# Patient Record
Sex: Female | Born: 1935
Health system: Southern US, Community
[De-identification: ages and names within clinical notes are randomized; demographics above are authoritative.]

## PROBLEM LIST (undated history)

## (undated) DIAGNOSIS — N183 Chronic kidney disease, stage 3 unspecified: Secondary | ICD-10-CM

## (undated) DIAGNOSIS — E039 Hypothyroidism, unspecified: Secondary | ICD-10-CM

## (undated) DIAGNOSIS — I2699 Other pulmonary embolism without acute cor pulmonale: Secondary | ICD-10-CM

## (undated) DIAGNOSIS — H547 Unspecified visual loss: Secondary | ICD-10-CM

## (undated) DIAGNOSIS — K529 Noninfective gastroenteritis and colitis, unspecified: Secondary | ICD-10-CM

## (undated) DIAGNOSIS — M5416 Radiculopathy, lumbar region: Secondary | ICD-10-CM

## (undated) DIAGNOSIS — K56609 Unspecified intestinal obstruction, unspecified as to partial versus complete obstruction: Secondary | ICD-10-CM

## (undated) DIAGNOSIS — I480 Paroxysmal atrial fibrillation: Secondary | ICD-10-CM

## (undated) DIAGNOSIS — Z79899 Other long term (current) drug therapy: Secondary | ICD-10-CM

## (undated) DIAGNOSIS — E049 Nontoxic goiter, unspecified: Secondary | ICD-10-CM

## (undated) DIAGNOSIS — I809 Phlebitis and thrombophlebitis of unspecified site: Secondary | ICD-10-CM

## (undated) DIAGNOSIS — M48061 Spinal stenosis, lumbar region without neurogenic claudication: Secondary | ICD-10-CM

## (undated) DIAGNOSIS — M316 Other giant cell arteritis: Secondary | ICD-10-CM

## (undated) HISTORY — PX: PARTIAL HYSTERECTOMY: SHX80

## (undated) HISTORY — DX: Chronic kidney disease, stage 3 unspecified: N18.30

## (undated) HISTORY — PX: OTHER SURGICAL HISTORY: SHX169

## (undated) HISTORY — DX: Other long term (current) drug therapy: Z79.899

## (undated) HISTORY — PX: BREAST BIOPSY: SHX20

## (undated) HISTORY — DX: Chronic kidney disease, stage 3 (moderate): N18.3

## (undated) HISTORY — DX: Noninfective gastroenteritis and colitis, unspecified: K52.9

## (undated) HISTORY — DX: Hypothyroidism, unspecified: E03.9

---
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
## 1997-07-25 ENCOUNTER — Other Ambulatory Visit: Admission: RE | Admit: 1997-07-25 | Discharge: 1997-07-25 | Payer: Self-pay | Admitting: *Deleted

## 1997-07-25 ENCOUNTER — Ambulatory Visit (HOSPITAL_COMMUNITY): Admission: RE | Admit: 1997-07-25 | Discharge: 1997-07-25 | Payer: Self-pay | Admitting: *Deleted

## 1997-07-25 ENCOUNTER — Inpatient Hospital Stay (HOSPITAL_COMMUNITY): Admission: EM | Admit: 1997-07-25 | Discharge: 1997-08-03 | Payer: Self-pay | Admitting: *Deleted

## 1997-09-21 ENCOUNTER — Ambulatory Visit: Admission: RE | Admit: 1997-09-21 | Discharge: 1997-09-21 | Payer: Self-pay | Admitting: Gynecology

## 1997-09-27 ENCOUNTER — Inpatient Hospital Stay (HOSPITAL_COMMUNITY): Admission: RE | Admit: 1997-09-27 | Discharge: 1997-10-03 | Payer: Self-pay | Admitting: Vascular Surgery

## 1997-09-30 ENCOUNTER — Encounter: Payer: Self-pay | Admitting: Gynecology

## 1998-01-14 HISTORY — PX: BACK SURGERY: SHX140

## 1998-04-14 ENCOUNTER — Encounter: Payer: Self-pay | Admitting: Neurological Surgery

## 1998-04-15 HISTORY — PX: BACK SURGERY: SHX140

## 1998-04-18 ENCOUNTER — Inpatient Hospital Stay (HOSPITAL_COMMUNITY): Admission: RE | Admit: 1998-04-18 | Discharge: 1998-04-21 | Payer: Self-pay | Admitting: Neurological Surgery

## 1998-04-18 ENCOUNTER — Encounter: Payer: Self-pay | Admitting: Neurological Surgery

## 1998-04-20 ENCOUNTER — Encounter: Payer: Self-pay | Admitting: Neurological Surgery

## 1998-05-15 ENCOUNTER — Encounter: Payer: Self-pay | Admitting: Neurological Surgery

## 1998-05-15 ENCOUNTER — Inpatient Hospital Stay (HOSPITAL_COMMUNITY): Admission: RE | Admit: 1998-05-15 | Discharge: 1998-05-17 | Payer: Self-pay | Admitting: Neurological Surgery

## 1998-08-11 ENCOUNTER — Ambulatory Visit (HOSPITAL_COMMUNITY): Admission: RE | Admit: 1998-08-11 | Discharge: 1998-08-11 | Payer: Self-pay | Admitting: Neurological Surgery

## 1998-08-11 ENCOUNTER — Encounter: Payer: Self-pay | Admitting: Neurological Surgery

## 1999-01-18 ENCOUNTER — Encounter: Admission: RE | Admit: 1999-01-18 | Discharge: 1999-01-18 | Payer: Self-pay | Admitting: Gynecology

## 1999-01-18 ENCOUNTER — Encounter: Payer: Self-pay | Admitting: Gynecology

## 1999-01-24 ENCOUNTER — Other Ambulatory Visit: Admission: RE | Admit: 1999-01-24 | Discharge: 1999-01-24 | Payer: Self-pay | Admitting: Gynecology

## 1999-04-15 HISTORY — PX: TOTAL KNEE ARTHROPLASTY: SHX125

## 1999-05-02 ENCOUNTER — Encounter: Payer: Self-pay | Admitting: *Deleted

## 1999-05-02 ENCOUNTER — Ambulatory Visit: Admission: RE | Admit: 1999-05-02 | Discharge: 1999-05-02 | Payer: Self-pay | Admitting: *Deleted

## 1999-08-31 ENCOUNTER — Encounter: Payer: Self-pay | Admitting: Orthopedic Surgery

## 1999-09-04 ENCOUNTER — Inpatient Hospital Stay (HOSPITAL_COMMUNITY): Admission: RE | Admit: 1999-09-04 | Discharge: 1999-09-07 | Payer: Self-pay | Admitting: Orthopedic Surgery

## 1999-09-04 ENCOUNTER — Encounter: Payer: Self-pay | Admitting: Orthopedic Surgery

## 1999-09-07 ENCOUNTER — Inpatient Hospital Stay (HOSPITAL_COMMUNITY)
Admission: RE | Admit: 1999-09-07 | Discharge: 1999-09-12 | Payer: Self-pay | Admitting: Physical Medicine & Rehabilitation

## 2000-01-15 HISTORY — PX: CARPAL TUNNEL RELEASE: SHX101

## 2000-02-05 ENCOUNTER — Other Ambulatory Visit: Admission: RE | Admit: 2000-02-05 | Discharge: 2000-02-05 | Payer: Self-pay | Admitting: Gynecology

## 2000-02-05 ENCOUNTER — Encounter: Admission: RE | Admit: 2000-02-05 | Discharge: 2000-02-05 | Payer: Self-pay | Admitting: Gynecology

## 2000-02-05 ENCOUNTER — Encounter: Payer: Self-pay | Admitting: Gynecology

## 2000-02-15 HISTORY — PX: TOTAL KNEE ARTHROPLASTY: SHX125

## 2000-02-21 ENCOUNTER — Inpatient Hospital Stay (HOSPITAL_COMMUNITY): Admission: RE | Admit: 2000-02-21 | Discharge: 2000-02-26 | Payer: Self-pay | Admitting: Orthopedic Surgery

## 2000-09-25 ENCOUNTER — Encounter: Admission: RE | Admit: 2000-09-25 | Discharge: 2000-09-25 | Payer: Self-pay | Admitting: Otolaryngology

## 2000-09-25 ENCOUNTER — Encounter: Payer: Self-pay | Admitting: Otolaryngology

## 2000-10-13 ENCOUNTER — Encounter: Admission: RE | Admit: 2000-10-13 | Discharge: 2000-10-13 | Payer: Self-pay | Admitting: *Deleted

## 2000-10-13 ENCOUNTER — Encounter: Payer: Self-pay | Admitting: *Deleted

## 2001-02-09 ENCOUNTER — Encounter: Admission: RE | Admit: 2001-02-09 | Discharge: 2001-02-09 | Payer: Self-pay | Admitting: Gynecology

## 2001-02-09 ENCOUNTER — Encounter: Payer: Self-pay | Admitting: Gynecology

## 2001-02-20 ENCOUNTER — Other Ambulatory Visit: Admission: RE | Admit: 2001-02-20 | Discharge: 2001-02-20 | Payer: Self-pay | Admitting: Gynecology

## 2001-05-26 ENCOUNTER — Emergency Department (HOSPITAL_COMMUNITY): Admission: EM | Admit: 2001-05-26 | Discharge: 2001-05-26 | Payer: Self-pay

## 2001-05-29 ENCOUNTER — Inpatient Hospital Stay (HOSPITAL_COMMUNITY): Admission: EM | Admit: 2001-05-29 | Discharge: 2001-06-01 | Payer: Self-pay | Admitting: Internal Medicine

## 2001-05-29 ENCOUNTER — Encounter: Payer: Self-pay | Admitting: Internal Medicine

## 2001-06-22 ENCOUNTER — Ambulatory Visit (HOSPITAL_COMMUNITY): Admission: RE | Admit: 2001-06-22 | Discharge: 2001-06-22 | Payer: Self-pay | Admitting: Gastroenterology

## 2001-09-23 ENCOUNTER — Ambulatory Visit (HOSPITAL_COMMUNITY): Admission: RE | Admit: 2001-09-23 | Discharge: 2001-09-23 | Payer: Self-pay | Admitting: Family Medicine

## 2001-09-23 ENCOUNTER — Encounter: Payer: Self-pay | Admitting: Family Medicine

## 2002-03-17 ENCOUNTER — Encounter: Payer: Self-pay | Admitting: Gynecology

## 2002-03-17 ENCOUNTER — Encounter: Admission: RE | Admit: 2002-03-17 | Discharge: 2002-03-17 | Payer: Self-pay | Admitting: Gynecology

## 2002-07-27 ENCOUNTER — Ambulatory Visit: Admission: RE | Admit: 2002-07-27 | Discharge: 2002-07-27 | Payer: Self-pay | Admitting: Family Medicine

## 2003-03-24 ENCOUNTER — Encounter: Admission: RE | Admit: 2003-03-24 | Discharge: 2003-03-24 | Payer: Self-pay | Admitting: Gynecology

## 2003-03-29 ENCOUNTER — Other Ambulatory Visit: Admission: RE | Admit: 2003-03-29 | Discharge: 2003-03-29 | Payer: Self-pay | Admitting: Gynecology

## 2003-10-27 ENCOUNTER — Ambulatory Visit (HOSPITAL_COMMUNITY): Admission: RE | Admit: 2003-10-27 | Discharge: 2003-10-27 | Payer: Self-pay | Admitting: Anesthesiology

## 2004-04-02 ENCOUNTER — Encounter: Admission: RE | Admit: 2004-04-02 | Discharge: 2004-04-02 | Payer: Self-pay | Admitting: Gynecology

## 2004-10-19 ENCOUNTER — Ambulatory Visit (HOSPITAL_COMMUNITY): Admission: RE | Admit: 2004-10-19 | Discharge: 2004-10-19 | Payer: Self-pay | Admitting: Anesthesiology

## 2004-12-12 ENCOUNTER — Encounter: Admission: RE | Admit: 2004-12-12 | Discharge: 2004-12-12 | Payer: Self-pay | Admitting: Gastroenterology

## 2004-12-26 ENCOUNTER — Encounter: Admission: RE | Admit: 2004-12-26 | Discharge: 2004-12-26 | Payer: Self-pay | Admitting: Gastroenterology

## 2004-12-28 ENCOUNTER — Encounter: Admission: RE | Admit: 2004-12-28 | Discharge: 2004-12-28 | Payer: Self-pay | Admitting: Gastroenterology

## 2005-01-02 ENCOUNTER — Ambulatory Visit (HOSPITAL_COMMUNITY): Admission: RE | Admit: 2005-01-02 | Discharge: 2005-01-02 | Payer: Self-pay | Admitting: *Deleted

## 2005-02-06 ENCOUNTER — Ambulatory Visit: Payer: Self-pay | Admitting: Internal Medicine

## 2005-03-04 ENCOUNTER — Ambulatory Visit: Payer: Self-pay | Admitting: Internal Medicine

## 2005-04-03 ENCOUNTER — Encounter: Admission: RE | Admit: 2005-04-03 | Discharge: 2005-04-03 | Payer: Self-pay | Admitting: Gynecology

## 2005-04-10 ENCOUNTER — Other Ambulatory Visit: Admission: RE | Admit: 2005-04-10 | Discharge: 2005-04-10 | Payer: Self-pay | Admitting: Gynecology

## 2005-11-27 ENCOUNTER — Encounter: Admission: RE | Admit: 2005-11-27 | Discharge: 2005-12-24 | Payer: Self-pay | Admitting: Neurology

## 2005-12-25 ENCOUNTER — Encounter: Admission: RE | Admit: 2005-12-25 | Discharge: 2006-01-22 | Payer: Self-pay | Admitting: Neurology

## 2006-01-27 ENCOUNTER — Encounter: Admission: RE | Admit: 2006-01-27 | Discharge: 2006-02-24 | Payer: Self-pay | Admitting: Anesthesiology

## 2006-01-30 ENCOUNTER — Encounter: Admission: RE | Admit: 2006-01-30 | Discharge: 2006-01-30 | Payer: Self-pay | Admitting: Cardiothoracic Surgery

## 2006-04-16 ENCOUNTER — Encounter: Admission: RE | Admit: 2006-04-16 | Discharge: 2006-04-16 | Payer: Self-pay | Admitting: Gynecology

## 2006-06-26 ENCOUNTER — Ambulatory Visit (HOSPITAL_COMMUNITY): Admission: RE | Admit: 2006-06-26 | Discharge: 2006-06-26 | Payer: Self-pay | Admitting: Anesthesiology

## 2007-02-10 ENCOUNTER — Ambulatory Visit: Payer: Self-pay | Admitting: Internal Medicine

## 2007-02-19 ENCOUNTER — Ambulatory Visit: Payer: Self-pay | Admitting: Internal Medicine

## 2007-04-21 ENCOUNTER — Encounter: Admission: RE | Admit: 2007-04-21 | Discharge: 2007-04-21 | Payer: Self-pay | Admitting: Gynecology

## 2007-08-31 ENCOUNTER — Ambulatory Visit: Payer: Self-pay | Admitting: Internal Medicine

## 2008-03-10 ENCOUNTER — Ambulatory Visit: Payer: Self-pay | Admitting: Internal Medicine

## 2008-04-26 ENCOUNTER — Encounter: Admission: RE | Admit: 2008-04-26 | Discharge: 2008-04-26 | Payer: Self-pay | Admitting: Gynecology

## 2008-09-17 DIAGNOSIS — I4891 Unspecified atrial fibrillation: Secondary | ICD-10-CM | POA: Insufficient documentation

## 2008-09-17 DIAGNOSIS — R0989 Other specified symptoms and signs involving the circulatory and respiratory systems: Secondary | ICD-10-CM

## 2008-09-17 DIAGNOSIS — R0609 Other forms of dyspnea: Secondary | ICD-10-CM | POA: Insufficient documentation

## 2008-09-17 DIAGNOSIS — M129 Arthropathy, unspecified: Secondary | ICD-10-CM

## 2008-09-17 DIAGNOSIS — M316 Other giant cell arteritis: Secondary | ICD-10-CM | POA: Insufficient documentation

## 2008-09-17 DIAGNOSIS — E079 Disorder of thyroid, unspecified: Secondary | ICD-10-CM

## 2008-09-21 ENCOUNTER — Ambulatory Visit: Payer: Self-pay | Admitting: Internal Medicine

## 2008-09-21 DIAGNOSIS — R55 Syncope and collapse: Secondary | ICD-10-CM | POA: Insufficient documentation

## 2008-10-20 ENCOUNTER — Telehealth: Payer: Self-pay | Admitting: Internal Medicine

## 2008-11-21 ENCOUNTER — Telehealth: Payer: Self-pay | Admitting: Internal Medicine

## 2008-11-25 ENCOUNTER — Telehealth: Payer: Self-pay | Admitting: Internal Medicine

## 2009-03-13 ENCOUNTER — Encounter: Payer: Self-pay | Admitting: Internal Medicine

## 2009-03-30 ENCOUNTER — Ambulatory Visit: Payer: Self-pay | Admitting: Internal Medicine

## 2009-05-01 ENCOUNTER — Encounter: Admission: RE | Admit: 2009-05-01 | Discharge: 2009-05-01 | Payer: Self-pay | Admitting: Gynecology

## 2010-01-01 ENCOUNTER — Inpatient Hospital Stay (HOSPITAL_COMMUNITY)
Admission: EM | Admit: 2010-01-01 | Discharge: 2010-01-03 | Payer: Self-pay | Source: Home / Self Care | Attending: Internal Medicine | Admitting: Internal Medicine

## 2010-02-03 ENCOUNTER — Encounter: Payer: Self-pay | Admitting: Gastroenterology

## 2010-02-15 NOTE — Assessment & Plan Note (Signed)
Summary: 6 MONTH/DMP  Medications Added AMIODARONE HCL 200 MG TABS (AMIODARONE HCL) Take one tablet by mouth 3 days a week/ 1/2 tab 4days per week CALCIUM-VITAMIN D 600-200 MG-UNIT TABS (CALCIUM-VITAMIN D) three times a day      Allergies Added: ! PREDNISONE ! CORTISONE ! * OMNICEF ! SULFA  Visit Type:  Follow-up Referring Provider:  Daneen Schick, MD Primary Provider:  Antony Contras, MD   History of Present Illness: [name] returns today for follow up.  She is a pleasant elderly woman with a h/o PAF and sinus bradycardia who has had a h/o syncope in the past.  Since I last saw her 6 months ago, she has done well.  No c/p, sob, or syncope.  Current Medications (verified): 1)  Amiodarone Hcl 200 Mg Tabs (Amiodarone Hcl) .... Take One Tablet By Mouth 3 Days A Week/ 1/2 Tab 4days Per Week 2)  Synthroid 175 Mcg Tabs (Levothyroxine Sodium) .Marland Kitchen.. 1 By Mouth Once Daily 3)  Coumadin 2.5 Mg Tabs (Warfarin Sodium) .Marland Kitchen.. 1 By Mouth Once Daily 4)  Calcium-Vitamin D 600-200 Mg-Unit Tabs (Calcium-Vitamin D) .... Three Times A Day 5)  Folic Acid   Powd (Folic Acid) .Marland Kitchen.. 1 By Mouth Once Daily 6)  Occuvite .Marland Kitchen.. 1 By Mouth Once Daily 7)  Glucosamine-Chondroitin 500-400 Mg Caps (Glucosamine-Chondroitin) .Marland Kitchen.. 1 By Mouth Once Daily  Allergies (verified): 1)  ! Prednisone 2)  ! Cortisone 3)  ! * Omnicef 4)  ! Sulfa  Past History:  Past Medical History: Last updated: 09/17/2008 ATRIAL FIBRILLATION (ICD-427.31) DYSPNEA ON EXERTION (ICD-786.09) ARTHRITIS (ICD-716.90) UNSPECIFIED DISORDER OF THYROID (ICD-246.9) TEMPORAL ARTERITIS (ICD-446.5)  Review of Systems  The patient denies chest pain, syncope, dyspnea on exertion, and peripheral edema.    Vital Signs:  Patient profile:   77 year old female Height:      66 inches Weight:      135 pounds Pulse rate:   53 / minute BP sitting:   122 / 60  (left arm)  Vitals Entered By: Margaretmary Bayley CMA (March 30, 2009 10:28 AM)  Physical  Exam  General:  Elderly woman NAD. Head:  normocephalic and atraumatic Eyes:  PERRLA/EOM intact; conjunctiva and lids normal. Mouth:  Teeth, gums and palate normal. Oral mucosa normal. Neck:  Neck supple, no JVD. No masses, thyromegaly or abnormal cervical nodes. Lungs:  Clear bilaterally to auscultation with no wheezes, rales, or rhonchi. Heart:  RRR with normal S1 and S2.  PMI is not laterally displaced.  No murmur. Abdomen:  Bowel sounds positive; abdomen soft and non-tender without masses, organomegaly, or hernias noted. No hepatosplenomegaly. Msk:  Back normal, normal gait. Muscle strength and tone normal. Pulses:  pulses normal in all 4 extremities Extremities:  No clubbing or cyanosis. Neurologic:  Alert and oriented x 3.   EKG  Procedure date:  03/30/2009  Findings:      Sinus bradycardia with rate of:  56.  Impression & Recommendations:  Problem # 1:  ATRIAL FIBRILLATION (ICD-427.31) She appears to be maintaining NSR on low dose amiodarone.  Continue current meds. Her updated medication list for this problem includes:    Amiodarone Hcl 200 Mg Tabs (Amiodarone hcl) .Marland Kitchen... Take one tablet by mouth 3 days a week/ 1/2 tab 4days per week    Coumadin 2.5 Mg Tabs (Warfarin sodium) .Marland Kitchen... 1 by mouth once daily  Problem # 2:  SYNCOPE (ICD-780.2) No recurrent symptoms.  She will continue to try and not get up too quickly as we  think her last spell of syncope was likely orthostasis. Her updated medication list for this problem includes:    Amiodarone Hcl 200 Mg Tabs (Amiodarone hcl) .Marland Kitchen... Take one tablet by mouth 3 days a week/ 1/2 tab 4days per week    Coumadin 2.5 Mg Tabs (Warfarin sodium) .Marland Kitchen... 1 by mouth once daily  Patient Instructions: 1)  Your physician recommends that you schedule a follow-up appointment in: 12 months with Dr Lovena Le

## 2010-03-26 LAB — POCT CARDIAC MARKERS
CKMB, poc: 1.8 ng/mL (ref 1.0–8.0)
Myoglobin, poc: 105 ng/mL (ref 12–200)
Troponin i, poc: <0.05 ng/mL (ref 0.00–0.09)

## 2010-03-26 LAB — PROTIME-INR
INR: 2.06 — ABNORMAL HIGH (ref 0.00–1.49)
Prothrombin Time: 23.4 seconds — ABNORMAL HIGH (ref 11.6–15.2)

## 2010-03-26 LAB — BASIC METABOLIC PANEL
BUN: 16 mg/dL (ref 6–23)
CO2: 25 mEq/L (ref 19–32)
Calcium: 8.5 mg/dL (ref 8.4–10.5)
Chloride: 108 mEq/L (ref 96–112)
Creatinine, Ser: 1.19 mg/dL (ref 0.4–1.2)
GFR calc Af Amer: 52 mL/min — ABNORMAL LOW (ref >60)
GFR calc non Af Amer: 43 mL/min — ABNORMAL LOW (ref >60)
Glucose, Bld: 93 mg/dL (ref 70–99)
Potassium: 4 mEq/L (ref 3.5–5.1)
Sodium: 140 mEq/L (ref 135–145)

## 2010-03-26 LAB — DIFFERENTIAL
Basophils Absolute: 0 10*3/uL (ref 0.0–0.1)
Basophils Absolute: 0 10*3/uL (ref 0.0–0.1)
Basophils Relative: 0 % (ref 0–1)
Basophils Relative: 0 % (ref 0–1)
Eosinophils Absolute: 0 10*3/uL (ref 0.0–0.7)
Eosinophils Absolute: 0.1 10*3/uL (ref 0.0–0.7)
Eosinophils Relative: 0 % (ref 0–5)
Eosinophils Relative: 1 % (ref 0–5)
Lymphocytes Relative: 18 % (ref 12–46)
Lymphocytes Relative: 4 % — ABNORMAL LOW (ref 12–46)
Lymphs Abs: 0.5 10*3/uL — ABNORMAL LOW (ref 0.7–4.0)
Lymphs Abs: 1.5 10*3/uL (ref 0.7–4.0)
Monocytes Absolute: 0.5 10*3/uL (ref 0.1–1.0)
Monocytes Absolute: 0.7 10*3/uL (ref 0.1–1.0)
Monocytes Relative: 5 % (ref 3–12)
Monocytes Relative: 8 % (ref 3–12)
Neutro Abs: 10.1 10*3/uL — ABNORMAL HIGH (ref 1.7–7.7)
Neutro Abs: 6.2 10*3/uL (ref 1.7–7.7)
Neutrophils Relative %: 73 % (ref 43–77)
Neutrophils Relative %: 91 % — ABNORMAL HIGH (ref 43–77)

## 2010-03-26 LAB — HEMOCCULT GUIAC POC 1CARD (OFFICE): Fecal Occult Bld: NEGATIVE

## 2010-03-26 LAB — COMPREHENSIVE METABOLIC PANEL
ALT: 22 U/L (ref 0–35)
AST: 33 U/L (ref 0–37)
Albumin: 4.2 g/dL (ref 3.5–5.2)
Alkaline Phosphatase: 97 U/L (ref 39–117)
BUN: 28 mg/dL — ABNORMAL HIGH (ref 6–23)
CO2: 27 mEq/L (ref 19–32)
Calcium: 9.5 mg/dL (ref 8.4–10.5)
Chloride: 103 mEq/L (ref 96–112)
Creatinine, Ser: 1.27 mg/dL — ABNORMAL HIGH (ref 0.4–1.2)
GFR calc Af Amer: 48 mL/min — ABNORMAL LOW (ref >60)
GFR calc non Af Amer: 40 mL/min — ABNORMAL LOW (ref >60)
Glucose, Bld: 117 mg/dL — ABNORMAL HIGH (ref 70–99)
Potassium: 4.2 mEq/L (ref 3.5–5.1)
Sodium: 140 mEq/L (ref 135–145)
Total Bilirubin: 0.8 mg/dL (ref 0.3–1.2)
Total Protein: 7.2 g/dL (ref 6.0–8.3)

## 2010-03-26 LAB — CBC
HCT: 34.5 % — ABNORMAL LOW (ref 36.0–46.0)
HCT: 36.3 % (ref 36.0–46.0)
HCT: 43 % (ref 36.0–46.0)
Hemoglobin: 11 g/dL — ABNORMAL LOW (ref 12.0–15.0)
Hemoglobin: 11.7 g/dL — ABNORMAL LOW (ref 12.0–15.0)
Hemoglobin: 14.3 g/dL (ref 12.0–15.0)
MCH: 30.1 pg (ref 26.0–34.0)
MCH: 30.3 pg (ref 26.0–34.0)
MCH: 30.8 pg (ref 26.0–34.0)
MCH: 31.6 pg (ref 26.0–34.0)
MCHC: 31.9 g/dL (ref 30.0–36.0)
MCHC: 32 g/dL (ref 30.0–36.0)
MCHC: 32.2 g/dL (ref 30.0–36.0)
MCHC: 33.3 g/dL (ref 30.0–36.0)
MCV: 94.5 fL (ref 78.0–100.0)
MCV: 94.7 fL (ref 78.0–100.0)
MCV: 94.9 fL (ref 78.0–100.0)
MCV: 95.5 fL (ref 78.0–100.0)
Platelets: 204 10*3/uL (ref 150–400)
Platelets: 210 10*3/uL (ref 150–400)
Platelets: 217 10*3/uL (ref 150–400)
Platelets: 261 10*3/uL (ref 150–400)
RBC: 3.65 MIL/uL — ABNORMAL LOW (ref 3.87–5.11)
RBC: 3.79 MIL/uL — ABNORMAL LOW (ref 3.87–5.11)
RBC: 3.8 MIL/uL — ABNORMAL LOW (ref 3.87–5.11)
RBC: 4.53 MIL/uL (ref 3.87–5.11)
RDW: 14.1 % (ref 11.5–15.5)
RDW: 14.2 % (ref 11.5–15.5)
RDW: 14.2 % (ref 11.5–15.5)
WBC: 11.2 10*3/uL — ABNORMAL HIGH (ref 4.0–10.5)
WBC: 7.8 10*3/uL (ref 4.0–10.5)
WBC: 8.4 10*3/uL (ref 4.0–10.5)

## 2010-03-26 LAB — LIPASE, BLOOD: Lipase: 23 U/L (ref 11–59)

## 2010-03-26 LAB — URINALYSIS, ROUTINE W REFLEX MICROSCOPIC
Bilirubin Urine: NEGATIVE
Glucose, UA: NEGATIVE mg/dL
Hgb urine dipstick: NEGATIVE
Ketones, ur: NEGATIVE mg/dL
Nitrite: NEGATIVE
Protein, ur: NEGATIVE mg/dL
Specific Gravity, Urine: 1.022 (ref 1.005–1.030)
Urobilinogen, UA: 0.2 mg/dL (ref 0.0–1.0)
pH: 7 (ref 5.0–8.0)

## 2010-04-18 ENCOUNTER — Other Ambulatory Visit: Payer: Self-pay | Admitting: Neurology

## 2010-04-18 DIAGNOSIS — I639 Cerebral infarction, unspecified: Secondary | ICD-10-CM

## 2010-04-18 DIAGNOSIS — R1110 Vomiting, unspecified: Secondary | ICD-10-CM

## 2010-04-19 ENCOUNTER — Ambulatory Visit
Admission: RE | Admit: 2010-04-19 | Discharge: 2010-04-19 | Disposition: A | Discharge status: Home / Self Care | Payer: Medicare Other | Source: Ambulatory Visit | Attending: Neurology | Admitting: Neurology

## 2010-04-19 DIAGNOSIS — R1110 Vomiting, unspecified: Secondary | ICD-10-CM

## 2010-04-23 ENCOUNTER — Other Ambulatory Visit: Payer: Self-pay | Admitting: Gastroenterology

## 2010-04-23 DIAGNOSIS — R112 Nausea with vomiting, unspecified: Secondary | ICD-10-CM

## 2010-04-25 ENCOUNTER — Ambulatory Visit
Admission: RE | Admit: 2010-04-25 | Discharge: 2010-04-25 | Disposition: A | Discharge status: Home / Self Care | Payer: Medicare Other | Source: Ambulatory Visit | Attending: Gastroenterology | Admitting: Gastroenterology

## 2010-04-25 DIAGNOSIS — R112 Nausea with vomiting, unspecified: Secondary | ICD-10-CM

## 2010-05-08 ENCOUNTER — Other Ambulatory Visit: Payer: Self-pay | Admitting: Gynecology

## 2010-05-08 DIAGNOSIS — Z1231 Encounter for screening mammogram for malignant neoplasm of breast: Secondary | ICD-10-CM

## 2010-05-14 ENCOUNTER — Other Ambulatory Visit: Payer: Self-pay | Admitting: *Deleted

## 2010-05-14 MED ORDER — AMIODARONE HCL 200 MG PO TABS: ORAL_TABLET | ORAL | Status: DC

## 2010-05-17 ENCOUNTER — Ambulatory Visit
Admission: RE | Admit: 2010-05-17 | Discharge: 2010-05-17 | Disposition: A | Discharge status: Home / Self Care | Payer: Medicare Other | Source: Ambulatory Visit | Attending: Gynecology | Admitting: Gynecology

## 2010-05-17 DIAGNOSIS — Z1231 Encounter for screening mammogram for malignant neoplasm of breast: Secondary | ICD-10-CM

## 2010-05-29 NOTE — Assessment & Plan Note (Signed)
[place]                         ELECTROPHYSIOLOGY OFFICE NOTE   NAME:Hamilton Hamilton CLENDENEN                        MRN:          [id number]  DATE:02/10/2007                            DOB:          1920-09-18    Hamilton Hamilton returns today after an almost two-year absence from our EP  clinic.  She is a very pleasant elderly woman with a history of atrial  fibrillation status post initiation of amiodarone therapy.  She returns  today for follow-up.  She notes that she does have some dyspnea with  exertion but otherwise has been stable.  She denies palpitations or any  symptoms of her atrial fibrillation.  She has had no dizziness or  symptoms of bradycardia.   Her medications include:  1. Calcium.  2. Multiple vitamins.  3. Amiodarone 200 a day.  4. Coumadin as directed.   PHYSICAL EXAMINATION:  She is a pleasant elderly woman in no distress.  Blood pressure was 154/70, pulse 54 and regular, respirations were 18,  weight was 142 pounds.  NECK:  Revealed no jugular distention.  LUNGS:  Clear bilaterally to auscultation.  No wheezes, rales or rhonchi  were present.  CARDIOVASCULAR EXAM:  Revealed a regular rate and rhythm with normal S1-  S2.  There were no murmurs, rubs or gallops present.  EXTREMITIES:  Demonstrated no edema.   EKG demonstrates sinus bradycardia with right bundle branch block.   IMPRESSION:  1. Paroxysmal atrial fibrillation.  2. Chronic amiodarone therapy.  3. Chronic Coumadin therapy.  4. Asymptomatic bradycardia.   DISCUSSION:  Overall, Hamilton Hamilton is stable today.  I have asked that she  decrease her amiodarone dose from 200 daily to 200 Monday through Friday  and 100 mg on Saturdays and Sundays.  Because of her dyspnea, I have  asked she undergo pulmonary function testings with a DLCO so that we can  better understand that she is not having problems from her amiodarone  with regard to decreased diffusion or other lung problems  from  amiodarone.  I will see her back in the office in 6 months.    Champ Mungo. Lovena Le, MD  Electronically Signed   GWT/MedQ  DD: 02/10/2007  DT: 02/11/2007  Job #: [id number]   cc:   Belva Crome, M.D.

## 2010-05-29 NOTE — Assessment & Plan Note (Signed)
[place]                         ELECTROPHYSIOLOGY OFFICE NOTE   NAME:[name], [name]                        MRN:          [id number]  DATE:08/31/2007                            DOB:          August 29, 1920    [name] returns today for followup.  She is a very pleasant elderly  woman with a history of paroxysmal atrial fibrillation and thyroid  dysfunction and hypertension who returns today for followup.  The  patient has had decreasing doses of amiodarone.  Despite this, she has  had no recurrent symptomatic AFib.  She does have dyspnea with exertion  still, which has not worsened.  Her DLCO demonstrated mild decreased  diffusing capacity though not severe.  She returns today for followup.  She denies chest pain.  She has a very mild dyspnea with exertion.  She  notes that her blood pressure is up today, but states that at home it  has not been.   PHYSICAL EXAMINATION:  VITAL SIGNS:  Her blood pressure was 182/70, the  pulse 52 and regular, respirations were 18, and weight was 137 pounds.  NECK:  No jugular venous distention.  LUNGS:  Clear.  Bilateral auscultation.  No wheezes, rales or rhonchi  are present.  CARDIAC:  Regular rate and rhythm with normal S1 and S2.  There are no  murmurs, rubs or gallops.  ABDOMEN:  Soft, nontender.  There is no organomegaly.  Bowel sounds  present.  No rebound or guarding was noted.  EXTREMITIES:  No edema.   The EKG demonstrates a sinus bradycardia with right bundle-branch block  and what appears to be a prior septal MI.   IMPRESSION:  1. Paroxysmal atrial fibrillation.  2. Sinus bradycardia.  3. Amiodarone therapy.  4. Coumadin therapy.   DISCUSSION:  [name] is stable and she is maintaining sinus rhythm  very nicely.  She does have some sinus bradycardia.  She has had  reduction in DLCO in the past which is only mild, and I have recommend a  period of watchful waiting on this with plans to repeat her  pulmonary  function testing if her dyspnea symptoms worsen.  In the meantime, I  have asked her to decrease her amiodarone to 200 mg Monday through  Thursday, 100 mg Friday through Sunday.  I will see her back in several  months.     Champ Mungo. Lovena Le, MD  Electronically Signed    GWT/MedQ  DD: 08/31/2007  DT: 09/01/2007  Job #: [id number]

## 2010-05-29 NOTE — Assessment & Plan Note (Signed)
[place]                         ELECTROPHYSIOLOGY OFFICE NOTE   NAME:[name], [name]                        MRN:          [id number]  DATE:03/10/2008                            DOB:          28-Mar-1920    [name] returns today for followup.  She is very pleasant elderly  woman with a history of atrial fibrillation who has been nicely  controlled in sinus rhythm on low-dose amiodarone who returns today for  followup.  The patient notes that she does get fatigued and weak, but  has otherwise been very stable.  She gets dyspnea with exertion  particularly going up a grade.   Her medications include calcium and folate supplements, Ocuvite,  chondroitin, Coumadin 2.5 mg as directed, Synthroid 175 mcg daily,  Amiodarone 200 mg 4 days a week and 100 mg 3 days a week.  She is also  on amlodipine 5 a day.   On physical exam, she is a pleasant elderly woman in no distress.  Blood  pressure was 138/52, the pulse was 58 and regular, respirations were 18,  the weight was 138 pounds.  The neck revealed no jugular venous  distention.  Lungs were clear bilaterally to auscultation.  No wheezes,  rales, or rhonchi are present.  No increased work of breathing was  there.  Abdominal exam was soft, nontender.  Extremities demonstrated no  edema.   EKG demonstrates sinus bradycardia with incomplete right bundle-branch  block.   We walked the patient in the hall today and she managed to go up  approximately 100 yards around with no drop in her oxygen saturation  maintaining about 95% and a heart rate increased from the low 60s to the  low 80s.  The patient has not appeared be particularly winded from this.   IMPRESSION:  1. Dyspnea with exertion, likely multifactorial.  2. Atrial fibrillation, which is now well-controlled on amiodarone      therapy.  3. Dyspnea is multifactorial.   DISCUSSION:  We will plan on decreasing the patient's amiodarone to 100  mg 4  days a week and 200 mg 3 days a week with additional decrements in  the dosing as time goes on.  I will see her back in several months.     Champ Mungo. Lovena Le, MD  Electronically Signed    GWT/MedQ  DD: 03/10/2008  DT: 03/10/2008  Job #: [id number]   cc:   Dr. Pernell Dupre

## 2010-06-01 NOTE — Discharge Summary (Signed)
[facility]  Patient:    [name], [name] Visit Number: [id number] MRN: [id number]          Service Type: MED Location: [id number] Attending Physician:  Doyce Para Dictated by:   Doyce Para, M.D. Admit Date:  05/29/2001 Discharge Date: 06/01/2001   CC:         Belenda Cruise D. Henrene Pastor, M.D.  Jeryl Columbia, M.D.   Discharge Summary  DATE OF BIRTH:  12/04/1920  DISCHARGE DIAGNOSES:  1. Recurrent Clostridium difficile colitis.     a. Recurred after 10 days of Flagyl.     b. Planned vancomycin therapy (see discharge medications).  2. Electrolyte abnormalities (improved).     a. Hypokalemia.     b. Hypophosphatemia.  3. Slightly elevated eosinophil count, doubt significant.  4. Malnutrition secondary to #1.  5. Dehydration, improved.  6. Hypothyroidism, thyroid-stimulating hormone 6.57 (follow up as an     outpatient).  Radioactive iodine for goiter approximately 40 years ago.  7. Pulmonary embolus in June 1999.  8. Temporal arteritis in 1998.  9. Macular degeneration. 10. Degenerative arthritis. 11. Status post partial hysterectomy. 12. Osteoporosis/osteopenia.  DISCHARGE MEDICATIONS: 1. (New) Vancomycin 500 mg p.o. t.i.d. x5 days, then b.i.d. x5 days, then    restart Flagyl 500 mg b.i.d. until followup with Dr. Watt Climes. 2. (New) Potassium chloride 10 mEq q.d. x5 days. 3. (New) K-Phos neutral 250 t.i.d. x5 days. 4. Chondroitin glucosamine t.i.d. 5. Synthroid 0.15 mcg q.d. 6. Calcium 0000000 mg t.i.d. 7. Folic acid Q000111Q mcg q.d. 8. Ocuvite q.d.  CONDITION ON DISCHARGE:  Stable.  DISPOSITION:  To home.  ACTIVITY:  As tolerated.  DIET:  Low residue diet.  SPECIAL INSTRUCTIONS:  Contact Dr. Watt Climes or return if you have problems.  Do not take Vioxx or Fosamax while having stomach problems.  FOLLOWUP:  The patient is to contact Dr. Watt Climes to schedule followup in two weeks.  The patient is to see Dr. Henrene Pastor on Thursday, May 22, at 9:30.  He will check a  BMET, phosphorus, magnesium and CBC with differential.  CONSULTATION:  Dr. Watt Climes.  PROCEDURE:  Abdominal x-ray on May 29, 2001, with nonobstructive bowel gas pattern.  HOSPITAL COURSE:  #1 - RECURRENT CLOSTRIDIUM DIFFICILE COLITIS:  The patient is a 77 year old female who was diagnosed with C. difficile colitis and completed a 10-day course of Flagyl as an outpatient on May 8.  She had recurrent diarrhea and was tested again and had a repeat positive study and was restarted on Flagyl on May 15.  The plan was to treat with 14-21 days of therapy.  She, however, continued to have significant diarrhea and dehydration as well as electrolyte abnormalities and was admitted to the hospital for fluid and electrolyte support as well as closer monitoring.  Dr. Watt Climes saw the patient and recommended vancomycin therapy as described above.  Her symptoms improved and at the time of discharge, she was tolerating a diet.  She will return if she has problems.  #2 - HYPOTHYROIDISM:  TSH was slightly elevated.  Will have primary care follow this up as an outpatient.  It may be affected by her acute illness.  #3 - ELECTROLYTE ABNORMALITIES/DEHYDRATION:  Potassium level was 3.1 and magnesium 2.1.  Initial phosphorus level was low at 1.2.  She received replacement.  At the time of discharge, her phosphorus had come up to 2.2 and her potassium was 4.1.  She will go home on daily replacement for five days and follow  up with her regular physician.  During her hospital stay, she was Hemoccult negative and her C. difficile was negative, but again, she did have a positive one as an outpatient prior and she had been on therapy.  DISCHARGE LABORATORY DATA AND X-RAY FINDINGS:  Hemoglobin 14.0, WBC 10,400, platelet count 355, 12% eos, absolute count 1200.  Sodium 140, potassium 4.1, chloride 112, bicarb 24, glucose 91, BUN 4, creatinine 1.0, calcium 7.8, albumin 2.6.  SGOT 18, SGPT 13, Alk phos 48, total bilirubin  0.6, magnesium 2.0, phosphorus 2.2. Dictated by:   Doyce Para, M.D. Attending Physician:  Doyce Para DD:  06/15/01 TD:  06/16/01 Job: [id number]

## 2010-06-01 NOTE — Consult Note (Signed)
[facility]  Patient:    [name], RAMETTA Visit Number: [id number] MRN: [id number]          Service Type: MED Location: [phone] Attending Physician:  Doyce Para Dictated by:   Jeryl Columbia, M.D. Proc. Date: 05/30/01 Admit Date:  05/29/2001 Discharge Date: 06/01/2001   CC:         Doyce Para, M.D.  Linda Hedges, M.D.   Consultation Report  HISTORY OF PRESENT ILLNESS:  Ms. Browell is seen at the request of Dr. Doyce Para for recurrent C. diff diarrhea.  She initially got on an antibiotic for a sore throat, which improved it within one day, but then when she stopped the antibiotic she began having diarrhea.  She initially was empirically treated with ten days of Flagyl, then got better within two days.  No workup was done at that junction but after stopping the Flagyl, a few days later her diarrhea increased.  She has had increased abdominal pain and some nausea but no vomiting or bleeding.  She was dehydrated earlier in the week, came to the ER, and got some IV fluids.  A C. diff was done at that time which came back on Thursday positive, and she was restarted on her Flagyl on Friday but when she did not improve right away, came to the emergency room for admission.  She has had no previous bouts with this, may have had a parasite years ago treated by Dr. Lyla Son.  Supposedly she may have had a flex sig at that time.  She got well on antibiotics at that juncture but has had no other GI symptoms. She has not had any travel exposures, at risk exposures, or any other previous GI problem.  PAST MEDICAL HISTORY: 1. Bilateral tubes and ovaries removed; she still has her uterus. 2. Temporal arteritis complicated with clots and a filter placed. 3. Macular disk degeneration. 4. Thyroid problems, status post radiation. 5. Arthritis.  FAMILY HISTORY:  Negative for any obvious GI problems, colitis or colon cancer or colon polyps.  SOCIAL HISTORY:  She  does not drink or smoke.  CURRENT MEDICATIONS:  Flagyl, Synthroid, multiple vitamins, subcu. heparin, Vioxx, Fosamax, calcium, folic acid, glucosamine at home.  REVIEW OF SYSTEMS:  Pertinent for her still going to the bathroom every time she eats and still feeling fairly weak.  PHYSICAL EXAMINATION:  GENERAL:  The patient is in no acute distress lying comfortably in the bed.  VITAL SIGNS:  Vital signs stable, afebrile.  HEENT:  Sclerae nonicteric.  NECK:  Supple without obvious adenopathy.  LUNGS:  Clear.  HEART:  Regular rate and rhythm.  ABDOMEN:  Slightly sore, no guarding or rebound, good bowel sounds, soft.  EXTREMITIES:  No pedal edema.  LABORATORY DATA:  Normal BUN and creatinine today.  Her potassium is 3.1.  She was guaiac negative.  Albumin 2.9.  White count 11.8, hemoglobin 12.8, platelets 293.  ASSESSMENT:  Recurrent Clostridium difficile.  PLAN:  Will go ahead and change to Vancomycin.  I have discussed that with the family.  Based on financial reasons, probably would change back to Flagyl as an outpatient or only she would use for a short time and then change to Flagyl.  Would proceed with a flex sig if symptoms got any worse or possibly after two to three weeks of therapy.  Consider a colonoscopy at that junction, not only for screening but to rule out any recurrent disease to help decide how quickly we can  wean the medicine, but I do prefer when C. diff recurs with a slow weaning and tapering dose of either the Flagyl or Vancomycin.  I have discussed with Dr. Eliberto Ivory and will follow with you. Dictated by:   Jeryl Columbia, M.D. Attending Physician:  Doyce Para DD:  05/30/01 TD:  06/01/01 Job: [id number]

## 2010-08-08 ENCOUNTER — Telehealth: Payer: Self-pay | Admitting: Internal Medicine

## 2010-08-08 NOTE — Telephone Encounter (Signed)
Okay to decrease her Amiodarone Spoke with patricia

## 2010-08-08 NOTE — Telephone Encounter (Signed)
Per pt call, pt saw Dr. [name], pt's other cardiologist after pt was having difficulties with heart. Dr. [name] advised that pt cut down of the dosage of amiodarone. Pt is due a refill of amiodarone soon and wanted to know if Dr. Lovena Le would also advise/permit pt to decrease dosage of amiodarone. Please return call to pt daughter to advise.

## 2011-01-24 DIAGNOSIS — H401490 Capsular glaucoma with pseudoexfoliation of lens, unspecified eye, stage unspecified: Secondary | ICD-10-CM | POA: Diagnosis not present

## 2011-01-24 DIAGNOSIS — H353190 Nonexudative age-related macular degeneration, unspecified eye, stage unspecified: Secondary | ICD-10-CM | POA: Diagnosis not present

## 2011-01-28 DIAGNOSIS — K5289 Other specified noninfective gastroenteritis and colitis: Secondary | ICD-10-CM | POA: Diagnosis not present

## 2011-01-31 DIAGNOSIS — L253 Unspecified contact dermatitis due to other chemical products: Secondary | ICD-10-CM | POA: Diagnosis not present

## 2011-01-31 DIAGNOSIS — L719 Rosacea, unspecified: Secondary | ICD-10-CM | POA: Diagnosis not present

## 2011-02-05 DIAGNOSIS — I4891 Unspecified atrial fibrillation: Secondary | ICD-10-CM | POA: Diagnosis not present

## 2011-02-05 DIAGNOSIS — Z7901 Long term (current) use of anticoagulants: Secondary | ICD-10-CM | POA: Diagnosis not present

## 2011-03-12 DIAGNOSIS — Z7901 Long term (current) use of anticoagulants: Secondary | ICD-10-CM | POA: Diagnosis not present

## 2011-03-26 DIAGNOSIS — I10 Essential (primary) hypertension: Secondary | ICD-10-CM | POA: Diagnosis not present

## 2011-03-26 DIAGNOSIS — E039 Hypothyroidism, unspecified: Secondary | ICD-10-CM | POA: Diagnosis not present

## 2011-03-26 DIAGNOSIS — M79609 Pain in unspecified limb: Secondary | ICD-10-CM | POA: Diagnosis not present

## 2011-03-26 DIAGNOSIS — I4891 Unspecified atrial fibrillation: Secondary | ICD-10-CM | POA: Diagnosis not present

## 2011-03-26 DIAGNOSIS — K5289 Other specified noninfective gastroenteritis and colitis: Secondary | ICD-10-CM | POA: Diagnosis not present

## 2011-03-26 DIAGNOSIS — E782 Mixed hyperlipidemia: Secondary | ICD-10-CM | POA: Diagnosis not present

## 2011-04-04 DIAGNOSIS — I10 Essential (primary) hypertension: Secondary | ICD-10-CM | POA: Diagnosis not present

## 2011-04-04 DIAGNOSIS — Z7901 Long term (current) use of anticoagulants: Secondary | ICD-10-CM | POA: Diagnosis not present

## 2011-04-04 DIAGNOSIS — Z79899 Other long term (current) drug therapy: Secondary | ICD-10-CM | POA: Diagnosis not present

## 2011-04-04 DIAGNOSIS — I2510 Atherosclerotic heart disease of native coronary artery without angina pectoris: Secondary | ICD-10-CM | POA: Diagnosis not present

## 2011-04-17 DIAGNOSIS — Z7901 Long term (current) use of anticoagulants: Secondary | ICD-10-CM | POA: Diagnosis not present

## 2011-04-17 DIAGNOSIS — I4891 Unspecified atrial fibrillation: Secondary | ICD-10-CM | POA: Diagnosis not present

## 2011-04-25 DIAGNOSIS — M4310 Spondylolisthesis, site unspecified: Secondary | ICD-10-CM | POA: Diagnosis not present

## 2011-04-25 DIAGNOSIS — M48061 Spinal stenosis, lumbar region without neurogenic claudication: Secondary | ICD-10-CM | POA: Diagnosis not present

## 2011-04-25 DIAGNOSIS — M47817 Spondylosis without myelopathy or radiculopathy, lumbosacral region: Secondary | ICD-10-CM | POA: Diagnosis not present

## 2011-05-09 DIAGNOSIS — H903 Sensorineural hearing loss, bilateral: Secondary | ICD-10-CM | POA: Diagnosis not present

## 2011-05-21 DIAGNOSIS — I4891 Unspecified atrial fibrillation: Secondary | ICD-10-CM | POA: Diagnosis not present

## 2011-05-21 DIAGNOSIS — Z7901 Long term (current) use of anticoagulants: Secondary | ICD-10-CM | POA: Diagnosis not present

## 2011-06-21 DIAGNOSIS — Z7901 Long term (current) use of anticoagulants: Secondary | ICD-10-CM | POA: Diagnosis not present

## 2011-06-21 DIAGNOSIS — I4891 Unspecified atrial fibrillation: Secondary | ICD-10-CM | POA: Diagnosis not present

## 2011-06-25 DIAGNOSIS — R109 Unspecified abdominal pain: Secondary | ICD-10-CM | POA: Diagnosis not present

## 2011-07-03 DIAGNOSIS — M79609 Pain in unspecified limb: Secondary | ICD-10-CM | POA: Diagnosis not present

## 2011-07-03 DIAGNOSIS — B351 Tinea unguium: Secondary | ICD-10-CM | POA: Diagnosis not present

## 2011-07-04 DIAGNOSIS — M79609 Pain in unspecified limb: Secondary | ICD-10-CM | POA: Diagnosis not present

## 2011-07-04 DIAGNOSIS — M48061 Spinal stenosis, lumbar region without neurogenic claudication: Secondary | ICD-10-CM | POA: Diagnosis not present

## 2011-07-04 DIAGNOSIS — G609 Hereditary and idiopathic neuropathy, unspecified: Secondary | ICD-10-CM | POA: Diagnosis not present

## 2011-07-15 DIAGNOSIS — K5289 Other specified noninfective gastroenteritis and colitis: Secondary | ICD-10-CM | POA: Diagnosis not present

## 2011-07-15 DIAGNOSIS — R109 Unspecified abdominal pain: Secondary | ICD-10-CM | POA: Diagnosis not present

## 2011-08-06 DIAGNOSIS — H43819 Vitreous degeneration, unspecified eye: Secondary | ICD-10-CM | POA: Diagnosis not present

## 2011-08-06 DIAGNOSIS — H353290 Exudative age-related macular degeneration, unspecified eye, stage unspecified: Secondary | ICD-10-CM | POA: Diagnosis not present

## 2011-08-06 DIAGNOSIS — H353190 Nonexudative age-related macular degeneration, unspecified eye, stage unspecified: Secondary | ICD-10-CM | POA: Diagnosis not present

## 2011-08-06 DIAGNOSIS — H2510 Age-related nuclear cataract, unspecified eye: Secondary | ICD-10-CM | POA: Diagnosis not present

## 2011-08-12 DIAGNOSIS — I4891 Unspecified atrial fibrillation: Secondary | ICD-10-CM | POA: Diagnosis not present

## 2011-08-12 DIAGNOSIS — Z7901 Long term (current) use of anticoagulants: Secondary | ICD-10-CM | POA: Diagnosis not present

## 2011-09-17 DIAGNOSIS — Z7901 Long term (current) use of anticoagulants: Secondary | ICD-10-CM | POA: Diagnosis not present

## 2011-09-17 DIAGNOSIS — I4891 Unspecified atrial fibrillation: Secondary | ICD-10-CM | POA: Diagnosis not present

## 2011-09-26 DIAGNOSIS — E782 Mixed hyperlipidemia: Secondary | ICD-10-CM | POA: Diagnosis not present

## 2011-09-26 DIAGNOSIS — Z23 Encounter for immunization: Secondary | ICD-10-CM | POA: Diagnosis not present

## 2011-09-26 DIAGNOSIS — I4891 Unspecified atrial fibrillation: Secondary | ICD-10-CM | POA: Diagnosis not present

## 2011-09-26 DIAGNOSIS — M79609 Pain in unspecified limb: Secondary | ICD-10-CM | POA: Diagnosis not present

## 2011-09-26 DIAGNOSIS — E039 Hypothyroidism, unspecified: Secondary | ICD-10-CM | POA: Diagnosis not present

## 2011-09-26 DIAGNOSIS — I10 Essential (primary) hypertension: Secondary | ICD-10-CM | POA: Diagnosis not present

## 2011-09-26 DIAGNOSIS — K5289 Other specified noninfective gastroenteritis and colitis: Secondary | ICD-10-CM | POA: Diagnosis not present

## 2011-10-01 DIAGNOSIS — Z7901 Long term (current) use of anticoagulants: Secondary | ICD-10-CM | POA: Diagnosis not present

## 2011-10-01 DIAGNOSIS — E782 Mixed hyperlipidemia: Secondary | ICD-10-CM | POA: Diagnosis not present

## 2011-10-01 DIAGNOSIS — I4891 Unspecified atrial fibrillation: Secondary | ICD-10-CM | POA: Diagnosis not present

## 2011-10-01 DIAGNOSIS — Z79899 Other long term (current) drug therapy: Secondary | ICD-10-CM | POA: Diagnosis not present

## 2011-10-15 DIAGNOSIS — Z79899 Other long term (current) drug therapy: Secondary | ICD-10-CM | POA: Diagnosis not present

## 2011-10-22 DIAGNOSIS — B351 Tinea unguium: Secondary | ICD-10-CM | POA: Diagnosis not present

## 2011-10-22 DIAGNOSIS — M79609 Pain in unspecified limb: Secondary | ICD-10-CM | POA: Diagnosis not present

## 2011-10-29 DIAGNOSIS — Z7901 Long term (current) use of anticoagulants: Secondary | ICD-10-CM | POA: Diagnosis not present

## 2011-10-29 DIAGNOSIS — I4891 Unspecified atrial fibrillation: Secondary | ICD-10-CM | POA: Diagnosis not present

## 2011-12-10 DIAGNOSIS — Z7901 Long term (current) use of anticoagulants: Secondary | ICD-10-CM | POA: Diagnosis not present

## 2011-12-10 DIAGNOSIS — I4891 Unspecified atrial fibrillation: Secondary | ICD-10-CM | POA: Diagnosis not present

## 2012-01-23 DIAGNOSIS — I4891 Unspecified atrial fibrillation: Secondary | ICD-10-CM | POA: Diagnosis not present

## 2012-01-23 DIAGNOSIS — Z7901 Long term (current) use of anticoagulants: Secondary | ICD-10-CM | POA: Diagnosis not present

## 2012-02-04 DIAGNOSIS — R109 Unspecified abdominal pain: Secondary | ICD-10-CM | POA: Diagnosis not present

## 2012-02-04 DIAGNOSIS — R932 Abnormal findings on diagnostic imaging of liver and biliary tract: Secondary | ICD-10-CM | POA: Diagnosis not present

## 2012-02-04 DIAGNOSIS — K5289 Other specified noninfective gastroenteritis and colitis: Secondary | ICD-10-CM | POA: Diagnosis not present

## 2012-03-06 DIAGNOSIS — L84 Corns and callosities: Secondary | ICD-10-CM | POA: Diagnosis not present

## 2012-03-26 DIAGNOSIS — E782 Mixed hyperlipidemia: Secondary | ICD-10-CM | POA: Diagnosis not present

## 2012-03-31 DIAGNOSIS — I2510 Atherosclerotic heart disease of native coronary artery without angina pectoris: Secondary | ICD-10-CM | POA: Diagnosis not present

## 2012-04-13 DIAGNOSIS — E039 Hypothyroidism, unspecified: Secondary | ICD-10-CM | POA: Diagnosis not present

## 2012-04-13 DIAGNOSIS — M549 Dorsalgia, unspecified: Secondary | ICD-10-CM | POA: Diagnosis not present

## 2012-04-13 DIAGNOSIS — E782 Mixed hyperlipidemia: Secondary | ICD-10-CM | POA: Diagnosis not present

## 2012-04-13 DIAGNOSIS — I10 Essential (primary) hypertension: Secondary | ICD-10-CM | POA: Diagnosis not present

## 2012-04-13 DIAGNOSIS — H548 Legal blindness, as defined in USA: Secondary | ICD-10-CM | POA: Diagnosis not present

## 2012-04-13 DIAGNOSIS — I4891 Unspecified atrial fibrillation: Secondary | ICD-10-CM | POA: Diagnosis not present

## 2012-04-13 DIAGNOSIS — G609 Hereditary and idiopathic neuropathy, unspecified: Secondary | ICD-10-CM | POA: Diagnosis not present

## 2012-04-16 DIAGNOSIS — Z7901 Long term (current) use of anticoagulants: Secondary | ICD-10-CM | POA: Diagnosis not present

## 2012-04-16 DIAGNOSIS — I4891 Unspecified atrial fibrillation: Secondary | ICD-10-CM | POA: Diagnosis not present

## 2012-05-08 DIAGNOSIS — Q828 Other specified congenital malformations of skin: Secondary | ICD-10-CM | POA: Diagnosis not present

## 2012-05-08 DIAGNOSIS — B351 Tinea unguium: Secondary | ICD-10-CM | POA: Diagnosis not present

## 2012-05-08 DIAGNOSIS — M79609 Pain in unspecified limb: Secondary | ICD-10-CM | POA: Diagnosis not present

## 2012-05-28 DIAGNOSIS — I4891 Unspecified atrial fibrillation: Secondary | ICD-10-CM | POA: Diagnosis not present

## 2012-05-28 DIAGNOSIS — Z7901 Long term (current) use of anticoagulants: Secondary | ICD-10-CM | POA: Diagnosis not present

## 2012-06-30 DIAGNOSIS — I4891 Unspecified atrial fibrillation: Secondary | ICD-10-CM | POA: Diagnosis not present

## 2012-06-30 DIAGNOSIS — Z7901 Long term (current) use of anticoagulants: Secondary | ICD-10-CM | POA: Diagnosis not present

## 2012-07-08 DIAGNOSIS — B351 Tinea unguium: Secondary | ICD-10-CM | POA: Diagnosis not present

## 2012-07-08 DIAGNOSIS — M79609 Pain in unspecified limb: Secondary | ICD-10-CM | POA: Diagnosis not present

## 2012-07-23 DIAGNOSIS — I4891 Unspecified atrial fibrillation: Secondary | ICD-10-CM | POA: Diagnosis not present

## 2012-07-23 DIAGNOSIS — Z7901 Long term (current) use of anticoagulants: Secondary | ICD-10-CM | POA: Diagnosis not present

## 2012-07-28 ENCOUNTER — Other Ambulatory Visit: Payer: Self-pay | Admitting: *Deleted

## 2012-08-04 DIAGNOSIS — H353190 Nonexudative age-related macular degeneration, unspecified eye, stage unspecified: Secondary | ICD-10-CM | POA: Diagnosis not present

## 2012-08-04 DIAGNOSIS — H353290 Exudative age-related macular degeneration, unspecified eye, stage unspecified: Secondary | ICD-10-CM | POA: Diagnosis not present

## 2012-08-06 DIAGNOSIS — I4891 Unspecified atrial fibrillation: Secondary | ICD-10-CM | POA: Diagnosis not present

## 2012-08-06 DIAGNOSIS — K5289 Other specified noninfective gastroenteritis and colitis: Secondary | ICD-10-CM | POA: Diagnosis not present

## 2012-08-06 DIAGNOSIS — R109 Unspecified abdominal pain: Secondary | ICD-10-CM | POA: Diagnosis not present

## 2012-08-06 DIAGNOSIS — Z7901 Long term (current) use of anticoagulants: Secondary | ICD-10-CM | POA: Diagnosis not present

## 2012-09-03 DIAGNOSIS — I4891 Unspecified atrial fibrillation: Secondary | ICD-10-CM | POA: Diagnosis not present

## 2012-09-03 DIAGNOSIS — Z7901 Long term (current) use of anticoagulants: Secondary | ICD-10-CM | POA: Diagnosis not present

## 2012-09-08 ENCOUNTER — Ambulatory Visit (INDEPENDENT_AMBULATORY_CARE_PROVIDER_SITE_OTHER): Payer: Medicare Other | Admitting: Neurology

## 2012-09-08 ENCOUNTER — Encounter: Payer: Self-pay | Admitting: Neurology

## 2012-09-08 VITALS — BP 149/75 | HR 62 | Ht 65.0 in | Wt 137.0 lb

## 2012-09-08 DIAGNOSIS — R2689 Other abnormalities of gait and mobility: Secondary | ICD-10-CM

## 2012-09-08 DIAGNOSIS — R269 Unspecified abnormalities of gait and mobility: Secondary | ICD-10-CM

## 2012-09-08 DIAGNOSIS — H9190 Unspecified hearing loss, unspecified ear: Secondary | ICD-10-CM

## 2012-09-08 DIAGNOSIS — G250 Essential tremor: Secondary | ICD-10-CM

## 2012-09-08 DIAGNOSIS — M545 Low back pain: Secondary | ICD-10-CM | POA: Diagnosis not present

## 2012-09-08 DIAGNOSIS — G609 Hereditary and idiopathic neuropathy, unspecified: Secondary | ICD-10-CM

## 2012-09-08 DIAGNOSIS — H9193 Unspecified hearing loss, bilateral: Secondary | ICD-10-CM

## 2012-09-08 DIAGNOSIS — H543 Unqualified visual loss, both eyes: Secondary | ICD-10-CM

## 2012-09-08 NOTE — Progress Notes (Signed)
Subjective:    Patient ID: [name] is a 79 y.o. female.  HPI  Interim history:   [name] is a very pleasant 79 year old right-handed woman who presents for followup consultation of her gait disorder, secondary to peripheral neuropathy, degenerative spine disease, blindness, hearing loss, vertigo, and essential tremor. She is accompanied by her daughter, Barbaraann Share, today. She presents for the first time after Dr. Tressia Danas retirement and was last seen by Dr. Jeneen Rinks love on 03/12/2012, at which time Dr. Erling Cruz felt that she was stable and that she was doing well living by herself. Her current medications include Tylenol, Coumadin, calcium and vitamin D, Ocuvite, glucosamine-chondroitin, amiodarone, Synthroid, Mesalamine. She has an underlying complex medical history including atrial fibrillation, thyroid disease, giant cell arteritis, DVT with PE, hysterectomy, status post [place] filter placement, spinal stenosis with surgery in 2000 twice, right knee replacement surgery in 2001, left knee replacement surgery in 2002, essential tremor, peripheral neuropathy, macular degeneration, hearing loss.  I reviewed Dr. Tressia Danas prior notes and the patient's records and below is a summary of that review:  79 year old right-handed woman with a multifactorial gait disorder. She lives alone and her daughter helps to take her places as she no longer drives. She sees Dr. Hardin Negus for pain management. She has had bladder incontinence. She has a long-standing history of peripheral neuropathy. She has a history of carpal tunnel syndrome with surgery to the right wrist. She uses a walker inside and outside of the house. She has not fallen recently. She has a 2 storey home. 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 2 daughters are local and her other daughter lives 20 minutes away. She has a life alert button.   Her Past Medical History Is Significant For: Past Medical History  Diagnosis Date   . Hypothyroidism   . Colitis     Her Past Surgical History Is Significant For: Past Surgical History  Procedure Laterality Date  . Back surgery  04/1998  . Back surgery  2000    Ruptured Disc  . Total knee arthroplasty Left 04/1999  . Total ankle replacement Right 02/2000  . Carpal tunnel release  2002    Her Family History Is Significant For: No family history on file.  Her Social History Is Significant For: History   Social History  . Marital Status: Widowed    Spouse Name: N/A    Number of Children: 4  . Years of Education: College   Occupational History  .     Social History Main Topics  . Smoking status: Never Smoker   . Smokeless tobacco: Never Used  . Alcohol Use: None  . Drug Use: None  . Sexual Activity: None   Other Topics Concern  . None   Social History Narrative   Patient lives at home alone.   Caffeine Use: rarely    Her Allergies Are:  Allergies  Allergen Reactions  . Cefdinir   . Cortisone   . Prednisone   . Sulfonamide Derivatives   :   Her Current Medications Are:  Outpatient Encounter Prescriptions as of 09/08/2012  Medication Sig Dispense Refill  . amiodarone (PACERONE) 200 MG tablet Take 100 mg by mouth daily. 1/2 tab qd      . levothyroxine (SYNTHROID, LEVOTHROID) 125 MCG tablet Take 125 mcg by mouth daily before breakfast.      . mesalamine (LIALDA) 1.2 G EC tablet Take 1,200 mg by mouth daily.      Marland Kitchen  warfarin (COUMADIN) 2.5 MG tablet Take 2.5 mg by mouth daily.      . [DISCONTINUED] amiodarone (PACERONE) 200 MG tablet Take 1 tab 3 days a week and 1/2 tab 4 days a week  30 tablet  11   No facility-administered encounter medications on file as of 09/08/2012.    Review of Systems  HENT: Positive for hearing loss.   Eyes: Positive for visual disturbance (loss vision).  Respiratory: Positive for shortness of breath.   Gastrointestinal:       Incontinence  Musculoskeletal: Positive for myalgias and arthralgias.       Cramps   Neurological: Positive for tremors and numbness.  Hematological: Bruises/bleeds easily.    Objective:  Neurologic Exam  Physical Exam Physical Examination:   Filed Vitals:   09/08/12 1117  BP: 149/75  Pulse: 62    General Examination: The patient is a very pleasant 79 y.o. female in no acute distress. She appears well-developed and well-nourished and well groomed.   HEENT: Normocephalic, atraumatic, pupils are equal, round and reactive to light and accommodation. Extraocular tracking is good without limitation to gaze excursion or nystagmus noted. Normal smooth pursuit is noted. Hearing is grossly intact. Tympanic membranes are clear bilaterally. Face is symmetric with normal facial animation and normal facial sensation. Speech is clear with no dysarthria noted. There is no hypophonia. There is a lip, no-no type neck/head, and voice tremor. Neck is supple with full range of passive and active motion. There are no carotid bruits on auscultation. Oropharynx exam reveals: mild mouth dryness, adequate dental hygiene and no significant airway crowding.  Chest: Clear to auscultation without wheezing, rhonchi or crackles noted.  Heart: S1+S2+0, regular and normal without murmurs, rubs or gallops noted.   Abdomen: Soft, non-tender and non-distended with normal bowel sounds appreciated on auscultation.  Extremities: There is no pitting edema in the distal lower extremities bilaterally. Pedal pulses are intact.  Skin: Warm and dry without trophic changes noted. There are no varicose veins.  Musculoskeletal: exam reveals no obvious joint deformities, tenderness or joint swelling or erythema.   Neurologically:  Mental status: The patient is awake, alert and oriented in all 4 spheres. Her memory, attention, language and knowledge are appropriate. There is no aphasia, agnosia, apraxia or anomia. Speech is clear with normal prosody and enunciation. Thought process is linear. Mood is congruent and  affect is normal.  Cranial nerves are as described above under HEENT exam. In addition, shoulder shrug is normal with equal shoulder height noted. Motor exam: Normal bulk, strength and tone is noted. There is no drift, tremor or rebound. Romberg is negative. Reflexes are 1+ throughout, absent in the knees and ankles. Toes are downgoing bilaterally. Fine motor skills are fairly well preserved.  Cerebellar testing shows no dysmetria or intention tremor on finger to nose testing. There is no truncal or gait ataxia.  Sensory exam is intact to light touch, pinprick, vibration, temperature sense in the upper extremities and decrease in vibration in the LEs.  Gait, station and balance:  are unremarkable. No veering to one side is noted. No leaning to one side is noted. Posture is mildly stooped. Stance is slightly wide-based. She walks independently for a few steps, she is slightly unsteady. Cannot do tandem walk. Wears a soft back brace.   Assessment and Plan:   In summary, IRHA ARNET is a very pleasant 79 y.o.-year old female with a history of mild peripheral neuropathy, chronic degenerative spine disease, blindness, hearing loss, vertigo, and  essential tremor and a gait disorder. She remains stable in her exam and I reassured the patient and her daughter in that regard. I had a long chat with the patient and her daughter about my findings and her neurological diagnoses. I would not recommend additional medication for neuropathy or tremor at this time. Truncal or midline tremor can be difficult to treat and she may be at risk of developing side effects. She and her daughter were in agreement. My main concern today is the fact that she continues to live alone. I'm particularly concerned about her cooking at the stove. She is hearing in visually impaired and has balance problems, gait dysfunction, and abnormal posture and is elderly. I've asked him to start a discussion with regards to what help she may get at  home on a day-to-day basis and whether there is an alternative living situation that she could embark on. I'm not making any medication changes today and have advised her to use her walker at all times. She did not bring her walker today stating that she was holding onto her daughter. I will see her back routinely in 6 months from now, sooner if the need arises. I have encouraged him to call with any interim questions, concerns, problems or updates.

## 2012-09-08 NOTE — Patient Instructions (Signed)
Please do not cook with large quantities of boiling water or oil safety. Please discuss with your family additional support and help at home or alternative living arrangements going into the future.

## 2012-09-09 DIAGNOSIS — B351 Tinea unguium: Secondary | ICD-10-CM | POA: Diagnosis not present

## 2012-09-09 DIAGNOSIS — M79609 Pain in unspecified limb: Secondary | ICD-10-CM | POA: Diagnosis not present

## 2012-09-09 DIAGNOSIS — I872 Venous insufficiency (chronic) (peripheral): Secondary | ICD-10-CM | POA: Diagnosis not present

## 2012-09-11 ENCOUNTER — Encounter (INDEPENDENT_AMBULATORY_CARE_PROVIDER_SITE_OTHER): Payer: Medicare Other | Admitting: *Deleted

## 2012-09-11 DIAGNOSIS — M79609 Pain in unspecified limb: Secondary | ICD-10-CM | POA: Diagnosis not present

## 2012-09-29 DIAGNOSIS — Z23 Encounter for immunization: Secondary | ICD-10-CM | POA: Diagnosis not present

## 2012-09-29 DIAGNOSIS — G5600 Carpal tunnel syndrome, unspecified upper limb: Secondary | ICD-10-CM | POA: Diagnosis not present

## 2012-09-29 DIAGNOSIS — I10 Essential (primary) hypertension: Secondary | ICD-10-CM | POA: Diagnosis not present

## 2012-09-29 DIAGNOSIS — K5289 Other specified noninfective gastroenteritis and colitis: Secondary | ICD-10-CM | POA: Diagnosis not present

## 2012-09-29 DIAGNOSIS — I4891 Unspecified atrial fibrillation: Secondary | ICD-10-CM | POA: Diagnosis not present

## 2012-09-29 DIAGNOSIS — E782 Mixed hyperlipidemia: Secondary | ICD-10-CM | POA: Diagnosis not present

## 2012-09-29 DIAGNOSIS — Z1331 Encounter for screening for depression: Secondary | ICD-10-CM | POA: Diagnosis not present

## 2012-09-29 DIAGNOSIS — E039 Hypothyroidism, unspecified: Secondary | ICD-10-CM | POA: Diagnosis not present

## 2012-10-13 DIAGNOSIS — I4891 Unspecified atrial fibrillation: Secondary | ICD-10-CM | POA: Diagnosis not present

## 2012-10-13 DIAGNOSIS — Z7901 Long term (current) use of anticoagulants: Secondary | ICD-10-CM | POA: Diagnosis not present

## 2012-10-13 DIAGNOSIS — I10 Essential (primary) hypertension: Secondary | ICD-10-CM | POA: Diagnosis not present

## 2012-10-13 DIAGNOSIS — I2510 Atherosclerotic heart disease of native coronary artery without angina pectoris: Secondary | ICD-10-CM | POA: Diagnosis not present

## 2012-10-13 DIAGNOSIS — Z79899 Other long term (current) drug therapy: Secondary | ICD-10-CM | POA: Diagnosis not present

## 2012-10-16 ENCOUNTER — Other Ambulatory Visit: Payer: Self-pay | Admitting: Interventional Cardiology

## 2012-10-16 DIAGNOSIS — Z79899 Other long term (current) drug therapy: Secondary | ICD-10-CM

## 2012-10-19 ENCOUNTER — Other Ambulatory Visit: Payer: Self-pay | Admitting: Interventional Cardiology

## 2012-10-19 ENCOUNTER — Other Ambulatory Visit: Payer: Self-pay

## 2012-10-19 DIAGNOSIS — Z79899 Other long term (current) drug therapy: Secondary | ICD-10-CM

## 2012-11-12 DIAGNOSIS — E039 Hypothyroidism, unspecified: Secondary | ICD-10-CM | POA: Diagnosis not present

## 2012-11-16 ENCOUNTER — Other Ambulatory Visit: Payer: Self-pay | Admitting: Interventional Cardiology

## 2012-11-26 ENCOUNTER — Ambulatory Visit (INDEPENDENT_AMBULATORY_CARE_PROVIDER_SITE_OTHER): Payer: Medicare Other | Admitting: Pharmacist

## 2012-11-26 DIAGNOSIS — I4891 Unspecified atrial fibrillation: Secondary | ICD-10-CM | POA: Diagnosis not present

## 2012-12-02 ENCOUNTER — Encounter: Payer: Self-pay | Admitting: Podiatrist

## 2012-12-02 ENCOUNTER — Ambulatory Visit (INDEPENDENT_AMBULATORY_CARE_PROVIDER_SITE_OTHER): Payer: Medicare Other | Admitting: Podiatrist

## 2012-12-02 VITALS — BP 135/66 | HR 67 | Resp 12 | Ht 65.0 in | Wt 130.0 lb

## 2012-12-02 DIAGNOSIS — M79609 Pain in unspecified limb: Secondary | ICD-10-CM | POA: Diagnosis not present

## 2012-12-02 DIAGNOSIS — B351 Tinea unguium: Secondary | ICD-10-CM

## 2012-12-02 NOTE — Progress Notes (Signed)
HPI:  Patient presents today for follow up of foot and nail care. Denies any new complaints today.  Objective:  Patients chart is reviewed.  Neurovascular status unchanged.  Patients nails are thickened, discolored, distrophic, friable and brittle with yellow-brown discoloration. Patient subjectively relates they are painful with shoes and with ambulation of bilateral feet.  Assessment:  Symptomatic onychomycosis  Plan:  Discussed treatment options and alternatives.  The symptomatic toenails were debrided through manual an mechanical means without complication.  Return appointment recommended at routine intervals of 3 months  also note, she had a small corn on her 5th toe of the right foot that has resolved with use of lambswool.    Trudie Buckler, DPM

## 2012-12-02 NOTE — Patient Instructions (Signed)

## 2012-12-03 DIAGNOSIS — H903 Sensorineural hearing loss, bilateral: Secondary | ICD-10-CM | POA: Diagnosis not present

## 2012-12-17 DIAGNOSIS — R109 Unspecified abdominal pain: Secondary | ICD-10-CM | POA: Diagnosis not present

## 2012-12-17 DIAGNOSIS — K5289 Other specified noninfective gastroenteritis and colitis: Secondary | ICD-10-CM | POA: Diagnosis not present

## 2012-12-24 ENCOUNTER — Ambulatory Visit (INDEPENDENT_AMBULATORY_CARE_PROVIDER_SITE_OTHER): Payer: Medicare Other | Admitting: Pharmacist

## 2012-12-24 DIAGNOSIS — I4891 Unspecified atrial fibrillation: Secondary | ICD-10-CM

## 2012-12-24 LAB — POCT INR: INR: 3.5

## 2013-01-12 ENCOUNTER — Ambulatory Visit (INDEPENDENT_AMBULATORY_CARE_PROVIDER_SITE_OTHER): Payer: Medicare Other | Admitting: Pharmacist

## 2013-01-12 DIAGNOSIS — I4891 Unspecified atrial fibrillation: Secondary | ICD-10-CM

## 2013-01-12 LAB — POCT INR: INR: 4

## 2013-01-17 ENCOUNTER — Encounter (HOSPITAL_COMMUNITY): Payer: Self-pay | Admitting: Emergency Medicine

## 2013-01-17 ENCOUNTER — Inpatient Hospital Stay (HOSPITAL_COMMUNITY): Payer: Medicare Other

## 2013-01-17 ENCOUNTER — Emergency Department (HOSPITAL_COMMUNITY): Payer: Medicare Other

## 2013-01-17 ENCOUNTER — Inpatient Hospital Stay (HOSPITAL_COMMUNITY)
Admission: EM | Admit: 2013-01-17 | Discharge: 2013-01-27 | DRG: 388 | Disposition: A | Discharge status: Home Health | Payer: Medicare Other | Attending: Internal Medicine | Admitting: Internal Medicine

## 2013-01-17 DIAGNOSIS — E039 Hypothyroidism, unspecified: Secondary | ICD-10-CM | POA: Diagnosis present

## 2013-01-17 DIAGNOSIS — Z5181 Encounter for therapeutic drug level monitoring: Secondary | ICD-10-CM | POA: Diagnosis not present

## 2013-01-17 DIAGNOSIS — Z7901 Long term (current) use of anticoagulants: Secondary | ICD-10-CM | POA: Diagnosis not present

## 2013-01-17 DIAGNOSIS — I2782 Chronic pulmonary embolism: Secondary | ICD-10-CM | POA: Diagnosis present

## 2013-01-17 DIAGNOSIS — Z96659 Presence of unspecified artificial knee joint: Secondary | ICD-10-CM | POA: Diagnosis not present

## 2013-01-17 DIAGNOSIS — Z79899 Other long term (current) drug therapy: Secondary | ICD-10-CM

## 2013-01-17 DIAGNOSIS — M129 Arthropathy, unspecified: Secondary | ICD-10-CM

## 2013-01-17 DIAGNOSIS — E876 Hypokalemia: Secondary | ICD-10-CM | POA: Diagnosis not present

## 2013-01-17 DIAGNOSIS — K52839 Microscopic colitis, unspecified: Secondary | ICD-10-CM

## 2013-01-17 DIAGNOSIS — R918 Other nonspecific abnormal finding of lung field: Secondary | ICD-10-CM | POA: Diagnosis not present

## 2013-01-17 DIAGNOSIS — I509 Heart failure, unspecified: Secondary | ICD-10-CM | POA: Diagnosis present

## 2013-01-17 DIAGNOSIS — R143 Flatulence: Secondary | ICD-10-CM | POA: Diagnosis not present

## 2013-01-17 DIAGNOSIS — K2970 Gastritis, unspecified, without bleeding: Secondary | ICD-10-CM | POA: Diagnosis not present

## 2013-01-17 DIAGNOSIS — N289 Disorder of kidney and ureter, unspecified: Secondary | ICD-10-CM | POA: Diagnosis not present

## 2013-01-17 DIAGNOSIS — D72829 Elevated white blood cell count, unspecified: Secondary | ICD-10-CM | POA: Diagnosis present

## 2013-01-17 DIAGNOSIS — Z803 Family history of malignant neoplasm of breast: Secondary | ICD-10-CM | POA: Diagnosis not present

## 2013-01-17 DIAGNOSIS — N184 Chronic kidney disease, stage 4 (severe): Secondary | ICD-10-CM | POA: Diagnosis present

## 2013-01-17 DIAGNOSIS — I369 Nonrheumatic tricuspid valve disorder, unspecified: Secondary | ICD-10-CM | POA: Diagnosis not present

## 2013-01-17 DIAGNOSIS — R791 Abnormal coagulation profile: Secondary | ICD-10-CM | POA: Diagnosis not present

## 2013-01-17 DIAGNOSIS — H543 Unqualified visual loss, both eyes: Secondary | ICD-10-CM | POA: Diagnosis present

## 2013-01-17 DIAGNOSIS — R141 Gas pain: Secondary | ICD-10-CM | POA: Diagnosis not present

## 2013-01-17 DIAGNOSIS — E079 Disorder of thyroid, unspecified: Secondary | ICD-10-CM | POA: Diagnosis present

## 2013-01-17 DIAGNOSIS — J9600 Acute respiratory failure, unspecified whether with hypoxia or hypercapnia: Secondary | ICD-10-CM | POA: Diagnosis not present

## 2013-01-17 DIAGNOSIS — K56609 Unspecified intestinal obstruction, unspecified as to partial versus complete obstruction: Principal | ICD-10-CM | POA: Diagnosis present

## 2013-01-17 DIAGNOSIS — I4891 Unspecified atrial fibrillation: Secondary | ICD-10-CM | POA: Diagnosis present

## 2013-01-17 DIAGNOSIS — Z86718 Personal history of other venous thrombosis and embolism: Secondary | ICD-10-CM

## 2013-01-17 DIAGNOSIS — I4892 Unspecified atrial flutter: Secondary | ICD-10-CM | POA: Diagnosis not present

## 2013-01-17 DIAGNOSIS — M316 Other giant cell arteritis: Secondary | ICD-10-CM

## 2013-01-17 DIAGNOSIS — J690 Pneumonitis due to inhalation of food and vomit: Secondary | ICD-10-CM | POA: Diagnosis not present

## 2013-01-17 DIAGNOSIS — R0989 Other specified symptoms and signs involving the circulatory and respiratory systems: Secondary | ICD-10-CM

## 2013-01-17 DIAGNOSIS — R197 Diarrhea, unspecified: Secondary | ICD-10-CM | POA: Diagnosis not present

## 2013-01-17 DIAGNOSIS — K2990 Gastroduodenitis, unspecified, without bleeding: Secondary | ICD-10-CM | POA: Diagnosis not present

## 2013-01-17 DIAGNOSIS — Z66 Do not resuscitate: Secondary | ICD-10-CM | POA: Diagnosis present

## 2013-01-17 DIAGNOSIS — K5289 Other specified noninfective gastroenteritis and colitis: Secondary | ICD-10-CM | POA: Diagnosis present

## 2013-01-17 DIAGNOSIS — R112 Nausea with vomiting, unspecified: Secondary | ICD-10-CM | POA: Diagnosis not present

## 2013-01-17 DIAGNOSIS — J189 Pneumonia, unspecified organism: Secondary | ICD-10-CM | POA: Diagnosis not present

## 2013-01-17 DIAGNOSIS — M79609 Pain in unspecified limb: Secondary | ICD-10-CM | POA: Diagnosis not present

## 2013-01-17 DIAGNOSIS — R55 Syncope and collapse: Secondary | ICD-10-CM

## 2013-01-17 DIAGNOSIS — I5030 Unspecified diastolic (congestive) heart failure: Secondary | ICD-10-CM | POA: Diagnosis present

## 2013-01-17 DIAGNOSIS — J90 Pleural effusion, not elsewhere classified: Secondary | ICD-10-CM | POA: Diagnosis not present

## 2013-01-17 DIAGNOSIS — J449 Chronic obstructive pulmonary disease, unspecified: Secondary | ICD-10-CM | POA: Diagnosis not present

## 2013-01-17 DIAGNOSIS — M7989 Other specified soft tissue disorders: Secondary | ICD-10-CM | POA: Diagnosis not present

## 2013-01-17 DIAGNOSIS — R0609 Other forms of dyspnea: Secondary | ICD-10-CM

## 2013-01-17 DIAGNOSIS — I2699 Other pulmonary embolism without acute cor pulmonale: Secondary | ICD-10-CM | POA: Diagnosis present

## 2013-01-17 DIAGNOSIS — R933 Abnormal findings on diagnostic imaging of other parts of digestive tract: Secondary | ICD-10-CM | POA: Diagnosis not present

## 2013-01-17 HISTORY — DX: Nontoxic goiter, unspecified: E04.9

## 2013-01-17 HISTORY — DX: Spinal stenosis, lumbar region without neurogenic claudication: M48.061

## 2013-01-17 HISTORY — DX: Phlebitis and thrombophlebitis of unspecified site: I80.9

## 2013-01-17 HISTORY — DX: Radiculopathy, lumbar region: M54.16

## 2013-01-17 HISTORY — DX: Other giant cell arteritis: M31.6

## 2013-01-17 HISTORY — DX: Paroxysmal atrial fibrillation: I48.0

## 2013-01-17 HISTORY — DX: Other pulmonary embolism without acute cor pulmonale: I26.99

## 2013-01-17 HISTORY — DX: Unspecified visual loss: H54.7

## 2013-01-17 HISTORY — DX: Unspecified intestinal obstruction, unspecified as to partial versus complete obstruction: K56.609

## 2013-01-17 LAB — TROPONIN I: Troponin I: <0.3 ng/mL (ref <0.30)

## 2013-01-17 LAB — URINALYSIS, ROUTINE W REFLEX MICROSCOPIC
Bilirubin Urine: NEGATIVE
GLUCOSE, UA: NEGATIVE mg/dL
HGB URINE DIPSTICK: NEGATIVE
Ketones, ur: NEGATIVE mg/dL
LEUKOCYTES UA: NEGATIVE
Nitrite: NEGATIVE
PROTEIN: NEGATIVE mg/dL
SPECIFIC GRAVITY, URINE: 1.025 (ref 1.005–1.030)
Urobilinogen, UA: 0.2 mg/dL (ref 0.0–1.0)
pH: 6.5 (ref 5.0–8.0)

## 2013-01-17 LAB — CBC WITH DIFFERENTIAL/PLATELET
BASOS ABS: 0 10*3/uL (ref 0.0–0.1)
BASOS PCT: 0 % (ref 0–1)
EOS ABS: 0 10*3/uL (ref 0.0–0.7)
EOS PCT: 0 % (ref 0–5)
HEMATOCRIT: 39.3 % (ref 36.0–46.0)
Hemoglobin: 13 g/dL (ref 12.0–15.0)
LYMPHS PCT: 4 % — AB (ref 12–46)
Lymphs Abs: 0.5 10*3/uL — ABNORMAL LOW (ref 0.7–4.0)
MCH: 30.8 pg (ref 26.0–34.0)
MCHC: 33.1 g/dL (ref 30.0–36.0)
MCV: 93.1 fL (ref 78.0–100.0)
MONO ABS: 1 10*3/uL (ref 0.1–1.0)
Monocytes Relative: 8 % (ref 3–12)
Neutro Abs: 11.5 10*3/uL — ABNORMAL HIGH (ref 1.7–7.7)
Neutrophils Relative %: 88 % — ABNORMAL HIGH (ref 43–77)
PLATELETS: 229 10*3/uL (ref 150–400)
RBC: 4.22 MIL/uL (ref 3.87–5.11)
RDW: 14.7 % (ref 11.5–15.5)
WBC: 13.1 10*3/uL — AB (ref 4.0–10.5)

## 2013-01-17 LAB — COMPREHENSIVE METABOLIC PANEL
ALBUMIN: 4.2 g/dL (ref 3.5–5.2)
ALT: 23 U/L (ref 0–35)
AST: 30 U/L (ref 0–37)
Alkaline Phosphatase: 100 U/L (ref 39–117)
BUN: 45 mg/dL — ABNORMAL HIGH (ref 6–23)
CALCIUM: 10 mg/dL (ref 8.4–10.5)
CO2: 29 meq/L (ref 19–32)
CREATININE: 1.36 mg/dL — AB (ref 0.50–1.10)
Chloride: 101 mEq/L (ref 96–112)
GFR calc Af Amer: 38 mL/min — ABNORMAL LOW (ref >90)
GFR, EST NON AFRICAN AMERICAN: 33 mL/min — AB (ref >90)
Glucose, Bld: 141 mg/dL — ABNORMAL HIGH (ref 70–99)
Potassium: 4.5 mEq/L (ref 3.7–5.3)
SODIUM: 142 meq/L (ref 137–147)
TOTAL PROTEIN: 7.5 g/dL (ref 6.0–8.3)
Total Bilirubin: 0.5 mg/dL (ref 0.3–1.2)

## 2013-01-17 LAB — INFLUENZA PANEL BY PCR (TYPE A & B)
H1N1 flu by pcr: NOT DETECTED
Influenza A By PCR: NEGATIVE
Influenza B By PCR: NEGATIVE

## 2013-01-17 LAB — PROTIME-INR
INR: 2.64 — AB (ref 0.00–1.49)
PROTHROMBIN TIME: 27.3 s — AB (ref 11.6–15.2)

## 2013-01-17 LAB — CG4 I-STAT (LACTIC ACID): Lactic Acid, Venous: 1.44 mmol/L (ref 0.5–2.2)

## 2013-01-17 LAB — LIPASE, BLOOD: Lipase: 36 U/L (ref 11–59)

## 2013-01-17 MED ORDER — LEVOTHYROXINE SODIUM 100 MCG IV SOLR
75.0000 ug | Freq: Every day | INTRAVENOUS | Status: DC
  Administered 2013-01-17 – 2013-01-25 (×9): 75 ug via INTRAVENOUS
  Filled 2013-01-17 (×10): qty 5

## 2013-01-17 MED ORDER — PHENOL 1.4 % MT LIQD
1.0000 | OROMUCOSAL | Status: DC | PRN
  Administered 2013-01-17: 1 via OROMUCOSAL
  Filled 2013-01-17: qty 177

## 2013-01-17 MED ORDER — PENICILLIN G POTASSIUM 5000000 UNITS IJ SOLR
1.5000 10*6.[IU] | Freq: Four times a day (QID) | INTRAMUSCULAR | Status: DC
  Administered 2013-01-17: 1.5 10*6.[IU] via INTRAVENOUS
  Filled 2013-01-17 (×3): qty 1.5

## 2013-01-17 MED ORDER — IOHEXOL 300 MG/ML  SOLN
80.0000 mL | Freq: Once | INTRAMUSCULAR | Status: AC | PRN
  Administered 2013-01-17: 80 mL via INTRAVENOUS

## 2013-01-17 MED ORDER — ONDANSETRON HCL 4 MG PO TABS: 4.0000 mg | ORAL_TABLET | Freq: Four times a day (QID) | ORAL | Status: DC | PRN

## 2013-01-17 MED ORDER — METRONIDAZOLE IN NACL 5-0.79 MG/ML-% IV SOLN
500.0000 mg | Freq: Three times a day (TID) | INTRAVENOUS | Status: DC
  Administered 2013-01-17 – 2013-01-21 (×13): 500 mg via INTRAVENOUS
  Filled 2013-01-17 (×14): qty 100

## 2013-01-17 MED ORDER — LIDOCAINE HCL 2 % EX GEL
CUTANEOUS | Status: AC
  Administered 2013-01-17: 10
  Filled 2013-01-17: qty 10

## 2013-01-17 MED ORDER — SODIUM CHLORIDE 0.9 % IJ SOLN
3.0000 mL | Freq: Two times a day (BID) | INTRAMUSCULAR | Status: DC
  Administered 2013-01-17 – 2013-01-26 (×12): 3 mL via INTRAVENOUS

## 2013-01-17 MED ORDER — PENICILLIN G POTASSIUM 5000000 UNITS IJ SOLR
1.5000 10*6.[IU] | Freq: Four times a day (QID) | INTRAMUSCULAR | Status: DC
  Administered 2013-01-18 – 2013-01-21 (×14): 1.5 10*6.[IU] via INTRAVENOUS
  Filled 2013-01-17 (×23): qty 1.5

## 2013-01-17 MED ORDER — DEXTROSE 5 % IV SOLN
500.0000 mg | INTRAVENOUS | Status: AC
  Administered 2013-01-17 – 2013-01-19 (×3): 500 mg via INTRAVENOUS
  Filled 2013-01-17 (×3): qty 500

## 2013-01-17 MED ORDER — SODIUM CHLORIDE 0.9 % IV SOLN
INTRAVENOUS | Status: DC
  Administered 2013-01-17 – 2013-01-18 (×2): via INTRAVENOUS

## 2013-01-17 MED ORDER — PROMETHAZINE HCL 25 MG/ML IJ SOLN: 12.5000 mg | Freq: Four times a day (QID) | INTRAMUSCULAR | Status: DC | PRN

## 2013-01-17 MED ORDER — WHITE PETROLATUM GEL
Status: DC | PRN
  Filled 2013-01-17: qty 5

## 2013-01-17 MED ORDER — ONDANSETRON HCL 4 MG/2ML IJ SOLN
4.0000 mg | Freq: Four times a day (QID) | INTRAMUSCULAR | Status: DC | PRN
  Administered 2013-01-17: 17:00:00 4 mg via INTRAVENOUS
  Filled 2013-01-17: qty 2

## 2013-01-17 MED ORDER — SODIUM CHLORIDE 0.9 % IV SOLN
INTRAVENOUS | Status: DC
Start: 2013-01-17 — End: 2013-01-20
  Administered 2013-01-17 – 2013-01-19 (×3): via INTRAVENOUS

## 2013-01-17 MED ORDER — IOHEXOL 300 MG/ML  SOLN
50.0000 mL | Freq: Once | INTRAMUSCULAR | Status: AC | PRN
  Administered 2013-01-17: 50 mL via ORAL

## 2013-01-17 MED ORDER — CHLORHEXIDINE GLUCONATE 0.12 % MT SOLN
15.0000 mL | Freq: Two times a day (BID) | OROMUCOSAL | Status: DC
  Administered 2013-01-17 – 2013-01-27 (×17): 15 mL via OROMUCOSAL
  Filled 2013-01-17 (×24): qty 15

## 2013-01-17 MED ORDER — ONDANSETRON HCL 4 MG/2ML IJ SOLN
4.0000 mg | Freq: Once | INTRAMUSCULAR | Status: AC
  Administered 2013-01-17: 4 mg via INTRAVENOUS
  Filled 2013-01-17: qty 2

## 2013-01-17 MED ORDER — HYDROMORPHONE HCL PF 1 MG/ML IJ SOLN: 0.2500 mg | INTRAMUSCULAR | Status: DC | PRN

## 2013-01-17 MED ORDER — BIOTENE DRY MOUTH MT LIQD
15.0000 mL | Freq: Two times a day (BID) | OROMUCOSAL | Status: DC
Start: 2013-01-17 — End: 2013-01-27
  Administered 2013-01-17 – 2013-01-26 (×13): 15 mL via OROMUCOSAL

## 2013-01-17 MED ORDER — SODIUM CHLORIDE 0.9 % IV BOLUS (SEPSIS)
1000.0000 mL | Freq: Once | INTRAVENOUS | Status: AC
  Administered 2013-01-17: 1000 mL via INTRAVENOUS

## 2013-01-17 NOTE — Consult Note (Signed)
Reason for Consult:SBO Referring Physician: Dr. Janece Canterbury  [name] is an 80 y.o. female.  HPI: She presented to the emergency department this morning after developing abdominal pain nausea and vomiting overnight. Her family is present in the room. They report multiple episodes of emesis. She described pain as cramping and the feeling like there was a watermelon on her stomach. The pain started centrally and in the upper abdomen. There is no hematemesis. She had a bowel movement last evening but has not passed flatus since. She had a similar episode in 2011 which resolved without the need for surgery or even an NG tube. Currently, she is more comfortable since a nasogastric tube is in place. She is otherwise without complaints. She denies shortness of breath or chest pain. There have been no fevers or chills.  Past Medical History  Diagnosis Date  . Hypothyroidism   . Colitis   . Blood clot associated with vein wall inflammation   . Pulmonary embolus     and DVT s/p IVC filter  . Paroxysmal atrial fibrillation   . Blindness   . SBO (small bowel obstruction)   . Temporal arteritis   . Goiter     s/p radioactive iod. tx  . Spinal stenosis of lumbar region with radiculopathy     possible neurogenic claudication recently?  Marland Kitchen PAF (paroxysmal atrial fibrillation)   . CKD (chronic kidney disease), stage IV     Past Surgical History  Procedure Laterality Date  . Back surgery  04/1998    spinal stenosis  . Back surgery  2000    Ruptured Disc  . Total knee arthroplasty Left 04/1999  . Total knee arthroplasty Right 02/2000  . Carpal tunnel release  2002  . Appendectomy  1940  . Partial hysterectomy    . Tonsillectomy  1934  . Breast biopsy      cysts in both breasts  . Ivc filter      Family History  Problem Relation Age of Onset  . Breast cancer Sister   . Crohn's disease Neg Hx   . Ulcerative colitis Neg Hx     Social History:  reports that she has never smoked. She  has never used smokeless tobacco. She reports that she does not drink alcohol or use illicit drugs.  Allergies:  Allergies  Allergen Reactions  . Cefdinir     Caused bad diarrhea  . Cortisone     Flushed hands and face   . Prednisone     cuased blood clots  . Sulfonamide Derivatives     Stomach pains and cramping     Medications: I have reviewed the patient's current medications.  Results for orders placed during the hospital encounter of 01/17/13 (from the past 48 hour(s))  CBC WITH DIFFERENTIAL     Status: Abnormal   Collection Time    01/17/13  6:32 AM      Result Value Range   WBC 13.1 (*) 4.0 - 10.5 K/uL   RBC 4.22  3.87 - 5.11 MIL/uL   Hemoglobin 13.0  12.0 - 15.0 g/dL   HCT 39.3  36.0 - 46.0 %   MCV 93.1  78.0 - 100.0 fL   MCH 30.8  26.0 - 34.0 pg   MCHC 33.1  30.0 - 36.0 g/dL   RDW 14.7  11.5 - 15.5 %   Platelets 229  150 - 400 K/uL   Neutrophils Relative % 88 (*) 43 - 77 %   Neutro Abs 11.5 (*)  1.7 - 7.7 K/uL   Lymphocytes Relative 4 (*) 12 - 46 %   Lymphs Abs 0.5 (*) 0.7 - 4.0 K/uL   Monocytes Relative 8  3 - 12 %   Monocytes Absolute 1.0  0.1 - 1.0 K/uL   Eosinophils Relative 0  0 - 5 %   Eosinophils Absolute 0.0  0.0 - 0.7 K/uL   Basophils Relative 0  0 - 1 %   Basophils Absolute 0.0  0.0 - 0.1 K/uL  COMPREHENSIVE METABOLIC PANEL     Status: Abnormal   Collection Time    01/17/13  6:32 AM      Result Value Range   Sodium 142  137 - 147 mEq/L   Comment: Please note change in reference range.   Potassium 4.5  3.7 - 5.3 mEq/L   Comment: Please note change in reference range.   Chloride 101  96 - 112 mEq/L   CO2 29  19 - 32 mEq/L   Glucose, Bld 141 (*) 70 - 99 mg/dL   BUN 45 (*) 6 - 23 mg/dL   Creatinine, Ser 1.36 (*) 0.50 - 1.10 mg/dL   Calcium 10.0  8.4 - 10.5 mg/dL   Total Protein 7.5  6.0 - 8.3 g/dL   Albumin 4.2  3.5 - 5.2 g/dL   AST 30  0 - 37 U/L   ALT 23  0 - 35 U/L   Alkaline Phosphatase 100  39 - 117 U/L   Total Bilirubin 0.5  0.3 - 1.2  mg/dL   GFR calc non Af Amer 33 (*) >90 mL/min   GFR calc Af Amer 38 (*) >90 mL/min   Comment: (NOTE)     The eGFR has been calculated using the CKD EPI equation.     This calculation has not been validated in all clinical situations.     eGFR's persistently <90 mL/min signify possible Chronic Kidney     Disease.  LIPASE, BLOOD     Status: None   Collection Time    01/17/13  6:32 AM      Result Value Range   Lipase 36  11 - 59 U/L  PROTIME-INR     Status: Abnormal   Collection Time    01/17/13  6:32 AM      Result Value Range   Prothrombin Time 27.3 (*) 11.6 - 15.2 seconds   INR 2.64 (*) 0.00 - 1.49  CG4 I-STAT (LACTIC ACID)     Status: None   Collection Time    01/17/13  6:42 AM      Result Value Range   Lactic Acid, Venous 1.44  0.5 - 2.2 mmol/L  URINALYSIS, ROUTINE W REFLEX MICROSCOPIC     Status: None   Collection Time    01/17/13  7:30 AM      Result Value Range   Color, Urine YELLOW  YELLOW   APPearance CLEAR  CLEAR   Specific Gravity, Urine 1.025  1.005 - 1.030   pH 6.5  5.0 - 8.0   Glucose, UA NEGATIVE  NEGATIVE mg/dL   Hgb urine dipstick NEGATIVE  NEGATIVE   Bilirubin Urine NEGATIVE  NEGATIVE   Ketones, ur NEGATIVE  NEGATIVE mg/dL   Protein, ur NEGATIVE  NEGATIVE mg/dL   Urobilinogen, UA 0.2  0.0 - 1.0 mg/dL   Nitrite NEGATIVE  NEGATIVE   Leukocytes, UA NEGATIVE  NEGATIVE   Comment: MICROSCOPIC NOT DONE ON URINES WITH NEGATIVE PROTEIN, BLOOD, LEUKOCYTES, NITRITE, OR GLUCOSE <1000 mg/dL.  Ct Abdomen Pelvis W Contrast  01/17/2013   CLINICAL DATA:  Abdominal pain with emesis this morning. History of megacolon. Mild leukocytosis.  EXAM: CT ABDOMEN AND PELVIS WITH CONTRAST  TECHNIQUE: Multidetector CT imaging of the abdomen and pelvis was performed using the standard protocol following bolus administration of intravenous contrast.  CONTRAST:  59m OMNIPAQUE IOHEXOL 300 MG/ML SOLN, 864mOMNIPAQUE IOHEXOL 300 MG/ML SOLN  COMPARISON:  Ultrasound 04/25/2010.  CT  01/01/2010.  FINDINGS: On initial scanning, the IV tubing leaked resulting in imaging prior to contrast administration. After securing the IV line, contrast was reinjected and the scan was repeated. Patient was not charged for the noncontrast portion of this study.  There is new patchy airspace disease in the right middle lobe. Patchy left lower lobe airspace disease is similar to the prior examination. There is mild underlying emphysema. No significant pleural or pericardial effusion is present.  The liver, gallbladder and biliary system appear unremarkable. There is chronic fatty replacement of the pancreas without focal abnormality. The spleen, adrenal glands and kidneys appear unremarkable.  There is moderate dilatation of the stomach and proximal to mid small bowel. The distal small bowel is decompressed. Area of transition appears to be in the right false pelvis where there appears to be some twirling of the mesentery on the sagittal images, suggesting a possible internal hernia. No bowel wall hernias are identified. No surrounding mass is identified. There is no small bowel wall thickening, extraluminal fluid collection or pneumatosis.  The colon is decompressed. There is sigmoid colon atherosclerosis without surrounding inflammatory change. The uterus, ovaries and bladder appear normal.  There is diffuse aortoiliac atherosclerosis. IVC filter is again noted with penetration of the limbs beyond on the walls. There is no retroperitoneal hematoma or lymphadenopathy.  Thoracolumbar scoliosis and spondylosis are stable. There is stable chronic right femoral head avascular necrosis without subchondral collapse.  IMPRESSION: 1. Distal small bowel obstruction with apparent area of transition in the right false pelvis. There is a possible internal hernia in this region. No bowel wall hernia identified. 2. No evidence of perforation or abscess. 3. Sigmoid colon diverticulosis. 4. Right middle and left lower lobe  airspace disease suspicious for pneumonia.   Electronically Signed   By: BiCamie Patience.D.   On: 01/17/2013 09:09   Dg Chest Port 1 View  01/17/2013   CLINICAL DATA:  CT scan revealed abnormal opacities at the lung bases.  EXAM: PORTABLE CHEST - 1 VIEW  COMPARISON:  01/17/2013; 10/13/2012  FINDINGS: Hazy airspace opacities are present in the right middle lobe, left lower lobe, and possibly the lingula. These are new compared to 10/13/12.  Heart size within normal limits.  Nasogastric tube tip:  Stomach.  IMPRESSION: 1. Right middle lobe, left lower lobe, and possibly lingular airspace opacities favoring multi lobar pneumonia.   Electronically Signed   By: WaSherryl Barters.D.   On: 01/17/2013 11:49    Review of Systems  Constitutional: Negative for fever and chills.  Gastrointestinal: Positive for nausea, vomiting and abdominal pain.  Neurological: Positive for weakness.  All other systems reviewed and are negative.   Blood pressure 131/47, pulse 70, temperature 98.4 F (36.9 C), temperature source Oral, resp. rate 19, height '5\' 6"'  (1.676 m), weight 136 lb 14.5 oz (62.1 kg), SpO2 93.00%. Physical Exam  Constitutional: She is oriented to person, place, and time. She appears well-developed and well-nourished. No distress.  She is elderly but spry  HENT:  Head: Normocephalic and atraumatic.  Right  Ear: External ear normal.  Left Ear: External ear normal.  Nose: Nose normal.  Mouth/Throat: No oropharyngeal exudate.  Hearing is diminished  Eyes: Conjunctivae are normal. Pupils are equal, round, and reactive to light. Right eye exhibits no discharge. Left eye exhibits no discharge. No scleral icterus.  Neck: Normal range of motion. Neck supple. No tracheal deviation present.  Cardiovascular: Normal heart sounds and intact distal pulses.   No murmur heard. Irregular rhythm  Respiratory: Effort normal and breath sounds normal. No respiratory distress. She has no wheezes.  GI: Soft. She exhibits  distension. There is tenderness. There is no guarding.  She is mildly distended but minimally tender with no guarding. There are no hernias  Musculoskeletal: Normal range of motion.  Lymphadenopathy:    She has no cervical adenopathy.  Neurological: She is alert and oriented to person, place, and time.  Skin: Skin is warm and dry. No rash noted. No erythema.  Psychiatric: Her behavior is normal. Judgment normal.    Assessment/Plan: Small bowel obstruction  I suspect this is from adhesions. Still, this may be from gastroenteritis. Hopefully, it will resolve with conservative measures. Given her distention, I do believe a nasogastric tube is necessary. We will not plan to aggressively reverse her anticoagulation unless surgery as deemed necessary. We will continue IV hydration, bowel rest, and Will repeat abdominal x-rays in the morning.  Jaray Boliver A 01/17/2013, 12:39 PM

## 2013-01-17 NOTE — ED Notes (Addendum)
Per EMS, pt has h/o macro colitis. She called EMS to her home d/t multiple episode of vomiting. She last ate at 7pm with her daughter who denies any sx's. C/o pain all over and lower abdomen feels like a rock. She receives Zofran 4mg  IV in route to hospital.

## 2013-01-17 NOTE — ED Notes (Signed)
POCT CG4 given to Dr. Sharol Given.

## 2013-01-17 NOTE — ED Provider Notes (Signed)
CSN: [id number]     Arrival date & time 01/17/13  [id number] History   First MD Initiated Contact with Patient 01/17/13 [phone]     Chief Complaint  Patient presents with  . Nausea  . Emesis   (Consider location/radiation/quality/duration/timing/severity/associated sxs/prior Treatment) HPI  80 year old female presenting with her daughters for evaluation of abdominal pain and vomiting. Acute onset of symptoms around 7:00 last night. Patient describes a sharp constant pain in her mid abdomen. States that her lower abdomen felt very hard to the touch as is she "swallowed a watermelon." Multiple episodes of vomiting. She was too weak for her daughter to get her in the car so EMS was called. She currently feels much better and the nausea has resolved after a dose of zofran. She still has abdominal soreness, but not nearly as severe as initially it was. No fevers or chills. Stools have been soft, but denies specifically diarrhea. No BRB in vomitus, but some small specks of coffee ground.and patient is on Coumadin for history of A. fib, DVT/PE. No blood in her stool or melena.  Surgical hx significant for appendectomy.  Past Medical History  Diagnosis Date  . Hypothyroidism   . Colitis   . Blood clot associated with vein wall inflammation   . Pulmonary embolus    Past Surgical History  Procedure Laterality Date  . Back surgery  04/1998  . Back surgery  2000    Ruptured Disc  . Total knee arthroplasty Left 04/1999  . Total ankle replacement Right 02/2000  . Carpal tunnel release  2002   No family history on file. History  Substance Use Topics  . Smoking status: Never Smoker   . Smokeless tobacco: Never Used  . Alcohol Use: No   OB History   Grav Para Term Preterm Abortions TAB SAB Ect Mult Living                 Review of Systems  All systems reviewed and negative, other than as noted in HPI.   Allergies  Cefdinir; Cortisone; Prednisone; and Sulfonamide derivatives  Home Medications    Current Outpatient Rx  Name  Route  Sig  Dispense  Refill  . amiodarone (PACERONE) 200 MG tablet      TAKE (1/2) TABLET DAILY.   15 tablet   5   . Calcium Carb-Cholecalciferol (CALCIUM 600 + D) 600-200 MG-UNIT TABS   Oral   Take 1 tablet by mouth 3 (three) times daily.         Marland Kitchen levothyroxine (SYNTHROID, LEVOTHROID) 150 MCG tablet   Oral   Take 150 mcg by mouth daily before breakfast.         . mesalamine (LIALDA) 1.2 G EC tablet   Oral   Take 4.8 g by mouth daily with breakfast.          . Misc Natural Products (GLUCOSAMINE CHONDROITIN COMPLX) TABS   Oral   Take 1 tablet by mouth 2 (two) times daily as needed (joint pain relief).         . Multiple Vitamins-Minerals (OCUVITE ADULT 50+ PO)   Oral   Take 1 tablet by mouth daily.         Marland Kitchen warfarin (COUMADIN) 2.5 MG tablet   Oral   Take 2.5 mg by mouth See admin instructions. On Mon, Wed, Fri take 1/2 tab (1.25mg ) and Tues, Thurs, Sat, Sun take 1 tab (2.5mg ).          BP 126/52  Pulse 68  Temp(Src) 97.4 F (36.3 C) (Oral)  Resp 16  SpO2 98% Physical Exam  Nursing note and vitals reviewed. Constitutional: She appears well-developed and well-nourished. No distress.  HENT:  Head: Normocephalic and atraumatic.  Eyes: Conjunctivae are normal. Right eye exhibits no discharge. Left eye exhibits no discharge.  Neck: Neck supple.  Cardiovascular: Normal rate, regular rhythm and normal heart sounds.  Exam reveals no gallop and no friction rub.   No murmur heard. Pulmonary/Chest: Effort normal and breath sounds normal. No respiratory distress.  Abdominal: Soft. She exhibits no distension. There is no tenderness.  Musculoskeletal: She exhibits no edema and no tenderness.  Neurological: She is alert.  Skin: Skin is warm and dry.  Psychiatric: She has a normal mood and affect. Her behavior is normal. Thought content normal.    ED Course  Procedures (including critical care time) Labs Review Labs Reviewed   CBC WITH DIFFERENTIAL - Abnormal; Notable for the following:    WBC 13.1 (*)    Neutrophils Relative % 88 (*)    Neutro Abs 11.5 (*)    Lymphocytes Relative 4 (*)    Lymphs Abs 0.5 (*)    All other components within normal limits  COMPREHENSIVE METABOLIC PANEL - Abnormal; Notable for the following:    Glucose, Bld 141 (*)    BUN 45 (*)    Creatinine, Ser 1.36 (*)    GFR calc non Af Amer 33 (*)    GFR calc Af Amer 38 (*)    All other components within normal limits  PROTIME-INR - Abnormal; Notable for the following:    Prothrombin Time 27.3 (*)    INR 2.64 (*)    All other components within normal limits  LIPASE, BLOOD  URINALYSIS, ROUTINE W REFLEX MICROSCOPIC  CG4 I-STAT (LACTIC ACID)   Imaging Review Ct Abdomen Pelvis W Contrast  01/17/2013   CLINICAL DATA:  Abdominal pain with emesis this morning. History of megacolon. Mild leukocytosis.  EXAM: CT ABDOMEN AND PELVIS WITH CONTRAST  TECHNIQUE: Multidetector CT imaging of the abdomen and pelvis was performed using the standard protocol following bolus administration of intravenous contrast.  CONTRAST:  23mL OMNIPAQUE IOHEXOL 300 MG/ML SOLN, 107mL OMNIPAQUE IOHEXOL 300 MG/ML SOLN  COMPARISON:  Ultrasound 04/25/2010.  CT 01/01/2010.  FINDINGS: On initial scanning, the IV tubing leaked resulting in imaging prior to contrast administration. After securing the IV line, contrast was reinjected and the scan was repeated. Patient was not charged for the noncontrast portion of this study.  There is new patchy airspace disease in the right middle lobe. Patchy left lower lobe airspace disease is similar to the prior examination. There is mild underlying emphysema. No significant pleural or pericardial effusion is present.  The liver, gallbladder and biliary system appear unremarkable. There is chronic fatty replacement of the pancreas without focal abnormality. The spleen, adrenal glands and kidneys appear unremarkable.  There is moderate dilatation of  the stomach and proximal to mid small bowel. The distal small bowel is decompressed. Area of transition appears to be in the right false pelvis where there appears to be some twirling of the mesentery on the sagittal images, suggesting a possible internal hernia. No bowel wall hernias are identified. No surrounding mass is identified. There is no small bowel wall thickening, extraluminal fluid collection or pneumatosis.  The colon is decompressed. There is sigmoid colon atherosclerosis without surrounding inflammatory change. The uterus, ovaries and bladder appear normal.  There is diffuse aortoiliac atherosclerosis. IVC filter is again noted with  penetration of the limbs beyond on the walls. There is no retroperitoneal hematoma or lymphadenopathy.  Thoracolumbar scoliosis and spondylosis are stable. There is stable chronic right femoral head avascular necrosis without subchondral collapse.  IMPRESSION: 1. Distal small bowel obstruction with apparent area of transition in the right false pelvis. There is a possible internal hernia in this region. No bowel wall hernia identified. 2. No evidence of perforation or abscess. 3. Sigmoid colon diverticulosis. 4. Right middle and left lower lobe airspace disease suspicious for pneumonia.   Electronically Signed   By: Camie Patience M.D.   On: 01/17/2013 09:09    EKG Interpretation   None       MDM   1. SBO (small bowel obstruction)   2. Renal insufficiency   3. Anticoagulated on Coumadin     Very pleasant 89yF with abdominal pain and vomiting. Currently much improved. Abdomen is soft, NT and not distended. Mild renal insufficiency with significant elevation in BUN.   Pt increasingly nauseated after drinking PO contrast.  CT with evidence of SBO. Question possible internal hernia. Also R middle and L lobe airspace disease. W/o specific respiratory complaints, increased WOB or hypoxemia will defer from treating at this time.   Discussed with Dr Sheran Fava,  hospitalist, for admission. Discussed with Dr Ninfa Linden, general surgery, who will see in consult.   Virgel Manifold, MD 01/17/13 [phone]

## 2013-01-17 NOTE — H&P (Addendum)
[facility] History and Physical  BARRETT DUCHEMIN [id number] DOB: [birth year] DOA: 01/17/2013  Referring physician:  Virgel Manifold PCP:  [name], MD   Chief Complaint:  Abdominal pain, nausea  HPI:  The patient is a 80 y.o. year-old female with history of PAF, DVT and PE on A/C and s/p IVC filter, microscopic colitis and hx of self-resolved SBO, temporal arteritis no longer on prednisone, hypothyroidism, blindness, spinal stenosis who presents with abdominal pain and nausea.    The patient was last at their baseline health a few weeks ago.  She developed some softer more frequent stools and she was seen by [facility] who increased her mesalamine to 4.8 g per day.  Because of her blindness she was unsure whether she had melena or blood in her stools. She had some improvement in her stools. A week ago she was seen by her pharmacist and her INR was elevated so her Coumadin days was decreased slightly. She has had a mild sore but she is otherwise feeling well. Last night around 7:30 PM she developed some tentative 10 knifelike abdominal pain radiating from the left upper quadrant and epigastrium to the left upper quadrant. The pain was fairly constant it came in waves. She started vomiting around 3 AM and had many episodes of vomiting.  Her family states that the emesis was yellowish. There is no obvious blood or coffee grounds.  She became extremely weak and her family had to call EMS to transport her to the emergency department today.  In the emergency department her white blood cell count was 13.1, BUN 45 creatinine 1.36, INR 2.64.  She had a CT scan of the abdomen and pelvis which demonstrated a distal small bowel obstruction with apparent area of transition in the right false pelvis.  There is a possible internal hernia in this area but no bowel wall herniation. The report states that there is "twirling of the mesentery."  There was no perforation or abscess. Incidentally, she was noted  to have right middle and left lower lobe airspace disease suspicious for pneumonia.  General surgery has been consultative however they have not yet seen the patient.     Review of Systems:  General:  Chronic chills attributed to warfarin HEENT:  Denies changes to hearing and vision, rhinorrhea, sinus congestion, positive sore throat CV:  Denies chest pain and palpitations, lower extremity edema.  PULM:  Denies SOB, wheezing.  Coughing some this morning. GI:  Per history of present illness  GU:  Denies dysuria, frequency, urgency ENDO:  Denies polyuria, polydipsia.   HEME:  Denies hematemesis, blood in stools, melena, abnormal bruising or bleeding.  LYMPH:  Denies lymphadenopathy.   MSK:  Chronic arthralgias, myalgias.   DERM:  Denies skin rash or ulcer.   NEURO:  Denies focal numbness, weakness, slurred speech, confusion, facial droop. She has had some severe bilateral lower extremity lateral shin pain they do not feel like cramps. They come on at rest. They typically occur in the left leg but recently she has been having them in the right leg. They have been recurring and becoming more frequent over the last few days. PSYCH:  Denies anxiety and depression.    Past Medical History  Diagnosis Date  . Hypothyroidism   . Colitis   . Blood clot associated with vein wall inflammation   . Pulmonary embolus     and DVT s/p IVC filter  . Paroxysmal atrial fibrillation   . Blindness   . SBO (  small bowel obstruction)   . Temporal arteritis   . Goiter     s/p radioactive iod. tx  . Spinal stenosis of lumbar region with radiculopathy     possible neurogenic claudication recently?  Marland Kitchen PAF (paroxysmal atrial fibrillation)   . CKD (chronic kidney disease), stage IV    Past Surgical History  Procedure Laterality Date  . Back surgery  04/1998    spinal stenosis  . Back surgery  2000    Ruptured Disc  . Total knee arthroplasty Left 04/1999  . Total knee arthroplasty Right 02/2000  . Carpal  tunnel release  2002  . Appendectomy  1940  . Partial hysterectomy    . Tonsillectomy  1934  . Breast biopsy      cysts in both breasts  . Ivc filter     Social History:  reports that she has never smoked. She has never used smokeless tobacco. She reports that she does not drink alcohol or use illicit drugs. Patient lives at home alone.  Use walker to get around Caffeine Use: rarely  Allergies  Allergen Reactions  . Cefdinir     Caused bad diarrhea  . Cortisone     Flushed hands and face   . Prednisone     cuased blood clots  . Sulfonamide Derivatives     Stomach pains and cramping     Family History  Problem Relation Age of Onset  . Breast cancer Sister   . Crohn's disease Neg Hx   . Ulcerative colitis Neg Hx      Prior to Admission medications   Medication Sig Start Date End Date Taking? Authorizing Provider  amiodarone (PACERONE) 200 MG tablet TAKE (1/2) TABLET DAILY. 11/16/12  Yes Belva Crome III, MD  Calcium Carb-Cholecalciferol (CALCIUM 600 + D) 600-200 MG-UNIT TABS Take 1 tablet by mouth 3 (three) times daily.   Yes Historical Provider, MD  levothyroxine (SYNTHROID, LEVOTHROID) 150 MCG tablet Take 150 mcg by mouth daily before breakfast.   Yes Historical Provider, MD  mesalamine (LIALDA) 1.2 G EC tablet Take 4.8 g by mouth daily with breakfast.    Yes Historical Provider, MD  Misc Natural Products (GLUCOSAMINE CHONDROITIN COMPLX) TABS Take 1 tablet by mouth 2 (two) times daily as needed (joint pain relief).   Yes Historical Provider, MD  Multiple Vitamins-Minerals (OCUVITE ADULT 50+ PO) Take 1 tablet by mouth daily.   Yes Historical Provider, MD  warfarin (COUMADIN) 2.5 MG tablet Take 2.5 mg by mouth See admin instructions. On Mon, Wed, Fri take 1/2 tab (1.25mg ) and Tues, Thurs, Sat, Sun take 1 tab (2.5mg ).   Yes Historical Provider, MD   Physical Exam: Filed Vitals:   01/17/13 0730 01/17/13 0930 01/17/13 1021 01/17/13 1030  BP: 147/62 118/42 163/61 158/76   Pulse: 66 75 73 72  Temp: 98.2 F (36.8 C)     TempSrc: Oral     Resp: 19     SpO2: 98% 96% 100% 96%     General:  Thin Caucasian female, no acute distress  Eyes:  PERRL, anicteric, non-injected.  ENT:  Nares clear.  Perioral pallor OP clear, non-erythematous without plaques or exudates.  MMM.  Neck:  Supple without TM or JVD.    Lymph:  No cervical, supraclavicular, or submandibular LAD.  Cardiovascular:  RRR, normal S1, S2, without m/r/g.  2+ pulses, warm extremities  Respiratory:  Rales at the left base, otherwise clear, without increased WOB.  Abdomen:  Hyperactive BS (recently hooked up to  NG suction).  Soft, mild to moderate distention, mild tenderness to palpation in the superior quadrants without rebound or guarding.    Skin:  No rashes or focal lesions.  Musculoskeletal:  Normal bulk and tone.  No LE edema.  Psychiatric:  A & O x 4.  Appropriate affect.  Neurologic:  CN 3-12 intact.  4/5 strength.  Decreased sensation over bilateral feet, stable from prior.  Labs on Admission:  Basic Metabolic Panel:  Recent Labs Lab 01/17/13 0632  NA 142  K 4.5  CL 101  CO2 29  GLUCOSE 141*  BUN 45*  CREATININE 1.36*  CALCIUM 10.0   Liver Function Tests:  Recent Labs Lab 01/17/13 0632  AST 30  ALT 23  ALKPHOS 100  BILITOT 0.5  PROT 7.5  ALBUMIN 4.2    Recent Labs Lab 01/17/13 0632  LIPASE 36   No results found for this basename: AMMONIA,  in the last 168 hours CBC:  Recent Labs Lab 01/17/13 0632  WBC 13.1*  NEUTROABS 11.5*  HGB 13.0  HCT 39.3  MCV 93.1  PLT 229   Cardiac Enzymes: No results found for this basename: CKTOTAL, CKMB, CKMBINDEX, TROPONINI,  in the last 168 hours  BNP (last 3 results) No results found for this basename: PROBNP,  in the last 8760 hours CBG: No results found for this basename: GLUCAP,  in the last 168 hours  Radiological Exams on Admission: Ct Abdomen Pelvis W Contrast  01/17/2013   CLINICAL DATA:  Abdominal  pain with emesis this morning. History of megacolon. Mild leukocytosis.  EXAM: CT ABDOMEN AND PELVIS WITH CONTRAST  TECHNIQUE: Multidetector CT imaging of the abdomen and pelvis was performed using the standard protocol following bolus administration of intravenous contrast.  CONTRAST:  58mL OMNIPAQUE IOHEXOL 300 MG/ML SOLN, 50mL OMNIPAQUE IOHEXOL 300 MG/ML SOLN  COMPARISON:  Ultrasound 04/25/2010.  CT 01/01/2010.  FINDINGS: On initial scanning, the IV tubing leaked resulting in imaging prior to contrast administration. After securing the IV line, contrast was reinjected and the scan was repeated. Patient was not charged for the noncontrast portion of this study.  There is new patchy airspace disease in the right middle lobe. Patchy left lower lobe airspace disease is similar to the prior examination. There is mild underlying emphysema. No significant pleural or pericardial effusion is present.  The liver, gallbladder and biliary system appear unremarkable. There is chronic fatty replacement of the pancreas without focal abnormality. The spleen, adrenal glands and kidneys appear unremarkable.  There is moderate dilatation of the stomach and proximal to mid small bowel. The distal small bowel is decompressed. Area of transition appears to be in the right false pelvis where there appears to be some twirling of the mesentery on the sagittal images, suggesting a possible internal hernia. No bowel wall hernias are identified. No surrounding mass is identified. There is no small bowel wall thickening, extraluminal fluid collection or pneumatosis.  The colon is decompressed. There is sigmoid colon atherosclerosis without surrounding inflammatory change. The uterus, ovaries and bladder appear normal.  There is diffuse aortoiliac atherosclerosis. IVC filter is again noted with penetration of the limbs beyond on the walls. There is no retroperitoneal hematoma or lymphadenopathy.  Thoracolumbar scoliosis and spondylosis are  stable. There is stable chronic right femoral head avascular necrosis without subchondral collapse.  IMPRESSION: 1. Distal small bowel obstruction with apparent area of transition in the right false pelvis. There is a possible internal hernia in this region. No bowel wall hernia identified. 2.  No evidence of perforation or abscess. 3. Sigmoid colon diverticulosis. 4. Right middle and left lower lobe airspace disease suspicious for pneumonia.   Electronically Signed   By: Camie Patience M.D.   On: 01/17/2013 09:09    EKG:  NSR, new invert T in V4-6  Assessment/Plan Principal Problem:   SBO (small bowel obstruction) Active Problems:   Unspecified disorder of thyroid   Atrial fibrillation   Pulmonary embolus   Nausea and vomiting   Microscopic colitis   Leukocytosis   CKD (chronic kidney disease), stage IV  ---  SBO with possible internal hernia -  General surgery consultation -  NG to LIS -  IVF -  Zofran and phenergan prn -  Very low dose dilaudid for pain -  Early ambulation -  IS  -  Ice chips -  Hold oral medications  Chronic kidney disease, base creatinine 1.2-1.3 with CrCl 20-30.  Cr currently 1.36, probably prerenal.   -  Minimize nephrotoxins -  Renally dose medications  -  IVF -  Repeat creatinine in AM  Leukocytosis, likely due to SBO, but concern for possible PNA also -  Repeat cbc in AM  Possible PNA on CT with rales at left base.  CXR confirms multilobar PNA.  Will cover for aspiration and CAP -  Having mild cough and may have aspirated -  Patient has hx of severe diarrhea (? Bacterial overgrowth vs. C. Diff) when placed on abx in past -  Per uptodate guidelines:  Pen G + azithro + flagyl -  No fevers, so defer flu test for now -  Check urine legionella and S. pneumo  PAF, on A/C and rhythm controlled with amio -  Check ECG:  NSR with new inverted T-waves in V4-6, nonspecific and may be related to nausea/stomach distension >> add on troponins x 3 -   Telemetry -  Hold amiodarone  -  Consider IV infusion amio of a-fib recurs -  Holding a/c  PE/DVT -  Hold A/C for now -  Has IVC filter -  INR therapeutic.  Will not reverse unless imminent surgery -  Consider heparin gtt once INR < 2  Hypothyroidism, stable.  Change to IV synthroid.  (on 150mg  PO so 75mg  IV)  Spinal stenosis with increasing leg pains -  Will need neurology follow up  Diet:  NPO except ice chips Access:  PIV IVF:  yes Proph:  SCDs and therapeutic warfarin  Code Status: DNR Family Communication: patient and two daughters Disposition Plan: Admit to telemetry  Time spent: 60 min Janece Canterbury [facility] Pager [phone]  If 7PM-7AM, please contact night-coverage [email or web address] Password [facility] 01/17/2013, 11:43 AM

## 2013-01-18 ENCOUNTER — Inpatient Hospital Stay (HOSPITAL_COMMUNITY): Payer: Medicare Other

## 2013-01-18 DIAGNOSIS — J690 Pneumonitis due to inhalation of food and vomit: Secondary | ICD-10-CM

## 2013-01-18 LAB — CBC
HCT: 33.5 % — ABNORMAL LOW (ref 36.0–46.0)
HCT: 34.5 % — ABNORMAL LOW (ref 36.0–46.0)
Hemoglobin: 10.7 g/dL — ABNORMAL LOW (ref 12.0–15.0)
Hemoglobin: 10.9 g/dL — ABNORMAL LOW (ref 12.0–15.0)
MCH: 29.9 pg (ref 26.0–34.0)
MCH: 29.9 pg (ref 26.0–34.0)
MCHC: 31.9 g/dL (ref 30.0–36.0)
MCHC: 31.6 g/dL (ref 30.0–36.0)
MCV: 93.6 fL (ref 78.0–100.0)
MCV: 94.5 fL (ref 78.0–100.0)
PLATELETS: 201 10*3/uL (ref 150–400)
PLATELETS: 180 10*3/uL (ref 150–400)
RBC: 3.58 MIL/uL — ABNORMAL LOW (ref 3.87–5.11)
RBC: 3.65 MIL/uL — ABNORMAL LOW (ref 3.87–5.11)
RDW: 14.9 % (ref 11.5–15.5)
RDW: 14.9 % (ref 11.5–15.5)
WBC: 10.7 10*3/uL — ABNORMAL HIGH (ref 4.0–10.5)
WBC: 10.6 10*3/uL — AB (ref 4.0–10.5)

## 2013-01-18 LAB — COMPREHENSIVE METABOLIC PANEL
ALT: 31 U/L (ref 0–35)
AST: 29 U/L (ref 0–37)
Albumin: 3 g/dL — ABNORMAL LOW (ref 3.5–5.2)
Alkaline Phosphatase: 66 U/L (ref 39–117)
BUN: 26 mg/dL — AB (ref 6–23)
CO2: 25 mEq/L (ref 19–32)
CREATININE: 1.1 mg/dL (ref 0.50–1.10)
Calcium: 8.2 mg/dL — ABNORMAL LOW (ref 8.4–10.5)
Chloride: 106 mEq/L (ref 96–112)
GFR calc non Af Amer: 43 mL/min — ABNORMAL LOW (ref >90)
GFR, EST AFRICAN AMERICAN: 50 mL/min — AB (ref >90)
GLUCOSE: 103 mg/dL — AB (ref 70–99)
Potassium: 3.7 mEq/L (ref 3.7–5.3)
SODIUM: 141 meq/L (ref 137–147)
TOTAL PROTEIN: 5.7 g/dL — AB (ref 6.0–8.3)
Total Bilirubin: 0.7 mg/dL (ref 0.3–1.2)

## 2013-01-18 LAB — BASIC METABOLIC PANEL
BUN: 33 mg/dL — AB (ref 6–23)
CO2: 26 mEq/L (ref 19–32)
Calcium: 8.3 mg/dL — ABNORMAL LOW (ref 8.4–10.5)
Chloride: 106 mEq/L (ref 96–112)
Creatinine, Ser: 1.27 mg/dL — ABNORMAL HIGH (ref 0.50–1.10)
GFR calc Af Amer: 42 mL/min — ABNORMAL LOW (ref >90)
GFR calc non Af Amer: 36 mL/min — ABNORMAL LOW (ref >90)
Glucose, Bld: 119 mg/dL — ABNORMAL HIGH (ref 70–99)
Potassium: 4.1 mEq/L (ref 3.7–5.3)
Sodium: 141 mEq/L (ref 137–147)

## 2013-01-18 LAB — LEGIONELLA ANTIGEN, URINE: LEGIONELLA ANTIGEN, URINE: NEGATIVE

## 2013-01-18 LAB — PROTIME-INR
INR: 4.15 — ABNORMAL HIGH (ref 0.00–1.49)
INR: 3.99 — ABNORMAL HIGH (ref 0.00–1.49)
PROTHROMBIN TIME: 38.5 s — AB (ref 11.6–15.2)
Prothrombin Time: 37.4 seconds — ABNORMAL HIGH (ref 11.6–15.2)

## 2013-01-18 LAB — TROPONIN I: Troponin I: <0.3 ng/mL (ref <0.30)

## 2013-01-18 LAB — STREP PNEUMONIAE URINARY ANTIGEN: Strep Pneumo Urinary Antigen: NEGATIVE

## 2013-01-18 MED ORDER — MENTHOL 3 MG MT LOZG
1.0000 | LOZENGE | OROMUCOSAL | Status: DC | PRN
  Filled 2013-01-18: qty 9

## 2013-01-18 MED ORDER — PANTOPRAZOLE SODIUM 40 MG IV SOLR
40.0000 mg | Freq: Two times a day (BID) | INTRAVENOUS | Status: DC
Start: 2013-01-18 — End: 2013-01-25
  Administered 2013-01-18 – 2013-01-25 (×14): 40 mg via INTRAVENOUS
  Filled 2013-01-18 (×17): qty 40

## 2013-01-18 NOTE — Progress Notes (Signed)
Subjective: No complaints. She says she feels much better.  Objective: Vital signs in last 24 hours: Temp:  [98.3 F (36.8 C)-99.4 F (37.4 C)] 99.4 F (37.4 C) (01/05 0536) Pulse Rate:  [67-75] 68 (01/05 0536) Resp:  [18] 18 (01/05 0536) BP: (99-163)/(33-76) 120/51 mmHg (01/05 0536) SpO2:  [91 %-100 %] 91 % (01/05 0536) Weight:  [136 lb 14.5 oz (62.1 kg)] 136 lb 14.5 oz (62.1 kg) (01/04 1217) Last BM Date: 01/16/13  Intake/Output from previous day: 01/04 0701 - 01/05 0700 In: 2175 [I.V.:1875; IV Piggyback:300] Out: 950 [Urine:200; Emesis/NG output:150] Intake/Output this shift:    Resp: clear to auscultation bilaterally Cardio: regular rate and rhythm GI: soft, nontender. only mild distension. good bs  Lab Results:   Recent Labs  01/17/13 0632 01/18/13 0527  WBC 13.1* 10.7*  HGB 13.0 10.7*  HCT 39.3 33.5*  PLT 229 201   BMET  Recent Labs  01/17/13 0632 01/18/13 0527  NA 142 141  K 4.5 4.1  CL 101 106  CO2 29 26  GLUCOSE 141* 119*  BUN 45* 33*  CREATININE 1.36* 1.27*  CALCIUM 10.0 8.3*   PT/INR  Recent Labs  01/17/13 0632 01/18/13 0527  LABPROT 27.3* 38.5*  INR 2.64* 4.15*   ABG No results found for this basename: PHART, PCO2, PO2, HCO3,  in the last 72 hours  Studies/Results: Ct Abdomen Pelvis W Contrast  01/17/2013   CLINICAL DATA:  Abdominal pain with emesis this morning. History of megacolon. Mild leukocytosis.  EXAM: CT ABDOMEN AND PELVIS WITH CONTRAST  TECHNIQUE: Multidetector CT imaging of the abdomen and pelvis was performed using the standard protocol following bolus administration of intravenous contrast.  CONTRAST:  51mL OMNIPAQUE IOHEXOL 300 MG/ML SOLN, 32mL OMNIPAQUE IOHEXOL 300 MG/ML SOLN  COMPARISON:  Ultrasound 04/25/2010.  CT 01/01/2010.  FINDINGS: On initial scanning, the IV tubing leaked resulting in imaging prior to contrast administration. After securing the IV line, contrast was reinjected and the scan was repeated. Patient  was not charged for the noncontrast portion of this study.  There is new patchy airspace disease in the right middle lobe. Patchy left lower lobe airspace disease is similar to the prior examination. There is mild underlying emphysema. No significant pleural or pericardial effusion is present.  The liver, gallbladder and biliary system appear unremarkable. There is chronic fatty replacement of the pancreas without focal abnormality. The spleen, adrenal glands and kidneys appear unremarkable.  There is moderate dilatation of the stomach and proximal to mid small bowel. The distal small bowel is decompressed. Area of transition appears to be in the right false pelvis where there appears to be some twirling of the mesentery on the sagittal images, suggesting a possible internal hernia. No bowel wall hernias are identified. No surrounding mass is identified. There is no small bowel wall thickening, extraluminal fluid collection or pneumatosis.  The colon is decompressed. There is sigmoid colon atherosclerosis without surrounding inflammatory change. The uterus, ovaries and bladder appear normal.  There is diffuse aortoiliac atherosclerosis. IVC filter is again noted with penetration of the limbs beyond on the walls. There is no retroperitoneal hematoma or lymphadenopathy.  Thoracolumbar scoliosis and spondylosis are stable. There is stable chronic right femoral head avascular necrosis without subchondral collapse.  IMPRESSION: 1. Distal small bowel obstruction with apparent area of transition in the right false pelvis. There is a possible internal hernia in this region. No bowel wall hernia identified. 2. No evidence of perforation or abscess. 3. Sigmoid colon diverticulosis. 4.  Right middle and left lower lobe airspace disease suspicious for pneumonia.   Electronically Signed   By: Camie Patience M.D.   On: 01/17/2013 09:09   Dg Chest Port 1 View  01/17/2013   CLINICAL DATA:  CT scan revealed abnormal opacities at the  lung bases.  EXAM: PORTABLE CHEST - 1 VIEW  COMPARISON:  01/17/2013; 10/13/2012  FINDINGS: Hazy airspace opacities are present in the right middle lobe, left lower lobe, and possibly the lingula. These are new compared to 10/13/12.  Heart size within normal limits.  Nasogastric tube tip:  Stomach.  IMPRESSION: 1. Right middle lobe, left lower lobe, and possibly lingular airspace opacities favoring multi lobar pneumonia.   Electronically Signed   By: Sherryl Barters M.D.   On: 01/17/2013 11:49   Dg Abd Portable 1v  01/18/2013   CLINICAL DATA:  Distended abdomen with abdominal pain.  EXAM: PORTABLE ABDOMEN - 1 VIEW  COMPARISON:  01/17/2013.  FINDINGS: The small bowel obstruction pattern persists, with maximal diameter of small bowel loops in the central abdomen measuring 43 mm. No gross plain film evidence of free air on this supine radiograph. IVC filter again noted. Stool and bowel gas remains present within the rectosigmoid. Bilateral femoral head AVN is incidentally noted.  IMPRESSION: Persistent small bowel obstruction. Maximal diameter of small bowel is 43 mm.   Electronically Signed   By: Dereck Ligas M.D.   On: 01/18/2013 07:59    Anti-infectives: Anti-infectives   Start     Dose/Rate Route Frequency Ordered Stop   01/17/13 1800  penicillin G potassium 1.5 Million Units in dextrose 5 % 50 mL IVPB     1.5 Million Units 100 mL/hr over 30 Minutes Intravenous 4 times per day 01/17/13 1704     01/17/13 1230  penicillin G potassium 1.5 Million Units in dextrose 5 % 50 mL IVPB  Status:  Discontinued     1.5 Million Units 100 mL/hr over 30 Minutes Intravenous 4 times per day 01/17/13 1157 01/17/13 1704   01/17/13 1200  metroNIDAZOLE (FLAGYL) IVPB 500 mg     500 mg 100 mL/hr over 60 Minutes Intravenous Every 8 hours 01/17/13 1157     01/17/13 1200  azithromycin (ZITHROMAX) 500 mg in dextrose 5 % 250 mL IVPB     500 mg 250 mL/hr over 60 Minutes Intravenous Every 24 hours 01/17/13 1157 01/20/13 1159       Assessment/Plan: s/p * No surgery found * Xray shows persistent sbo but there is more air in colon Would continue ng for now ambulate  LOS: 1 day    TOTH III,PAUL S 01/18/2013

## 2013-01-18 NOTE — Evaluation (Signed)
Physical Therapy Evaluation Patient Details Name: [name] MRN: [id number] DOB: 14-Sep-1920 Today's Date: 01/18/2013 Time: [id number] PT Time Calculation (min): 26 min  PT Assessment / Plan / Recommendation History of Present Illness  Pt is a 80 year old female who presented to the emergency department this morning after developing abdominal pain nausea and vomiting overnight. Her family is present in the room. They report multiple episodes of emesis. She described pain as cramping and the feeling like there was a watermelon on her stomach. The pain started centrally and in the upper abdomen. There is no hematemesis. She had a bowel movement last evening but has not passed flatus since. She had a similar episode in 2011 which resolved without the need for surgery or even an NG tube. Currently, she is more comfortable since a nasogastric tube is in place. She is otherwise without complaints. She denies shortness of breath or chest pain. There have been no fevers or chills.  Clinical Impression  Pt admitted with SBO. Pt currently with functional limitations due to the deficits listed below (see PT Problem List).   Pt will benefit from skilled PT to increase their independence and safety with mobility to allow discharge to the venue listed below.   Pt is close to baseline and will likely progress well with no f/u PT needs.  Pt agreeable to ambulate at least 3x/day with nsg staff.  Pt also reports curled toes with pain that shoot up lateral lower leg in L LE occasionally at night however states not cramping.  Recommended she discuss this with PCP or her neurologist as she states she also has hx of peripheral neuropathy.     PT Assessment  Patient needs continued PT services    Follow Up Recommendations  No PT follow up    Does the patient have the potential to tolerate intense rehabilitation      Barriers to Discharge        Equipment Recommendations  None recommended by PT     Recommendations for Other Services     Frequency Min 3X/week    Precautions / Restrictions Precautions Precautions: Fall Precaution Comments: NG tube   Pertinent Vitals/Pain Reports some abdominal discomfort, feels better with mobility, repositioned in recliner      Mobility  Bed Mobility Bed Mobility: Supine to Sit Supine to Sit: 6: Modified independent (Device/Increase time);HOB elevated Transfers Transfers: Sit to Stand;Stand to Sit Sit to Stand: 4: Min guard;With upper extremity assist;From bed;From toilet Stand to Sit: 4: Min guard;With upper extremity assist;To bed;To toilet;To chair/3-in-1 Details for Transfer Assistance: verbal cues for hand placement Ambulation/Gait Ambulation/Gait Assistance: 4: Min guard Ambulation Distance (Feet): 400 Feet Assistive device: Rollator Ambulation/Gait Assistance Details: pt reports feeling good during ambulation, encouraged to ambulate at least 3x/day with nsg staff (due to lines, NG tube, etc) Gait Pattern: Step-through pattern;Decreased stride length    Exercises     PT Diagnosis: Difficulty walking  PT Problem List: Decreased mobility;Decreased strength PT Treatment Interventions: DME instruction;Gait training;Functional mobility training;Therapeutic activities;Therapeutic exercise;Patient/family education;Stair training     PT Goals(Current goals can be found in the care plan section) Acute Rehab PT Goals PT Goal Formulation: With patient Time For Goal Achievement: 02/01/13 Potential to Achieve Goals: Good  Visit Information  Last PT Received On: 01/18/13 Assistance Needed: +1 History of Present Illness: Pt is a 80 year old female who presented to the emergency department this morning after developing abdominal pain nausea and vomiting overnight. Her family is present in  the room. They report multiple episodes of emesis. She described pain as cramping and the feeling like there was a watermelon on her stomach. The pain  started centrally and in the upper abdomen. There is no hematemesis. She had a bowel movement last evening but has not passed flatus since. She had a similar episode in 2011 which resolved without the need for surgery or even an NG tube. Currently, she is more comfortable since a nasogastric tube is in place. She is otherwise without complaints. She denies shortness of breath or chest pain. There have been no fevers or chills.       Prior [place] expects to be discharged to:: Private residence Living Arrangements: Alone Type of Home: House Home Access: Stairs to enter [employer] of Steps: 3 Entrance Stairs-Rails: Right Home Layout: One level Home Equipment: [place] - 4 wheels;Other (comment) (rollator) Prior Function Level of Independence: Independent with assistive device(s) Communication Communication: No difficulties    Cognition  Cognition Arousal/Alertness: Awake/alert Behavior During Therapy: WFL for tasks assessed/performed Overall Cognitive Status: Within Functional Limits for tasks assessed    Extremity/Trunk Assessment Lower Extremity Assessment Lower Extremity Assessment: Overall WFL for tasks assessed   Balance    End of Session PT - End of Session Activity Tolerance: Patient tolerated treatment well Patient left: in chair;with call bell/phone within reach;with family/visitor present Nurse Communication: Mobility status  GP     Lawana Hartzell,KATHrine E 01/18/2013, 12:57 PM Carmelia Bake, PT, DPT 01/18/2013 Pager: [phone]

## 2013-01-18 NOTE — Progress Notes (Signed)
OT Cancellation Note  Patient Details Name: LOVENE FISHELL MRN: [id number] DOB: April 12, 1920   Cancelled Treatment:    Reason Eval/Treat Not Completed: Other (comment) Per pt she just returned to bed from being up with PT and nursing tech helping with bath afterwards. Nursing states pt just hooked back up to suction also.  Jules Schick [id number] 01/18/2013, 12:38 PM

## 2013-01-18 NOTE — Progress Notes (Signed)
Shift event: RN paged NP earlier in shift secondary to pt having new bright red bloody discharge from NGT to ILWS, about 75 cc, new since shift change. Pt says she saw some pink tinged d/c earlier today but no frank blood. Abd film showed less air in colon. NGT advanced by RN as per xray recommendations. INR 3.99. Coumadin on hold. Will TF 2 U FFP and r/p INR at 0500. Continue to monitor for increased bleeding. VSS. Hgb stable. CMP pending. Clance Boll, NP [facility]

## 2013-01-18 NOTE — Care Management Note (Signed)
    Page 1 of 1   01/18/2013     2:30:35 PM   CARE MANAGEMENT NOTE 01/18/2013  Patient:  [name], [name]   Account Number:  [id number]  Date Initiated:  01/18/2013  Documentation initiated by:  Dessa Phi  Subjective/Objective Assessment:   80 Y/O F ADMITTED W/SBO.     Action/Plan:   FROM HOME W/FAMILY.HAS ROLLATOR.   Anticipated DC Date:  01/22/2013   Anticipated DC Plan:  [place]  CM consult      Choice offered to / List presented to:             Status of service:  In process, will continue to follow Medicare Important Message given?   (If response is "NO", the following Medicare IM given date fields will be blank) Date Medicare IM given:   Date Additional Medicare IM given:    Discharge Disposition:    Per UR Regulation:  Reviewed for med. necessity/level of care/duration of stay  If discussed at [place] of Stay Meetings, dates discussed:    Comments:  01/18/13 Deania Siguenza RN,BSN NCM [id number] PT-NO F/U.D/C PLAN HOME.

## 2013-01-18 NOTE — Progress Notes (Signed)
[facility] PROGRESS NOTE  [name] [id number] DOB: 10-07-1920 DOA: 01/17/2013 PCP: [name], MD  Assessment/Plan  SBO with possible internal hernia.  X-ray demonstrates evidence of persistent bowel obstruction however there appears to be some gas in the colon and patient is passing gas - Appreciate General surgery recommendations and assistance  - Continue NG to LIS  - Continue IVF  - Continue Zofran and phenergan prn  - Continue Very low dose dilaudid for pain  - Frequent ambulation  - IS  - Ice chips  - Hold oral medications  -  NG causing sore throat:  Add lozenges  Chronic kidney disease, base creatinine 1.2-1.3 with CrCl 20-30. Creatinine trended down from 1.36-1.27 and BUN also decreased with IV fluids - Minimize nephrotoxins  - Renally dose medications  - Continue IVF  - Repeat creatinine in AM   Leukocytosis, likely due to SBO and possible PNA, trending down  Community-acquired versus aspiration PNA on CT with rales at left base. CXR confirms multilobar PNA. Due to episode of severe diarrhea with antibiotics in the past, discussed regimen of antibiotics which included IV Flagyl with pharmacy. Has agreed to use penicillin G plus azithromycin plus Flagyl. Azithromycin can be discontinued after third dose. -  Continue Pen G + azithro + flagyl day 2 - urine legionella and S. pneumo both negative  PAF, on A/C and rhythm controlled with amio.  Very symptomatic with episodes of atrial fibrillation. - Check ECG: NSR with new inverted T-waves in V4-6, nonspecific and may be related to nausea/stomach distension -  Troponins negative - Telemetry:  Normal sinus rhythm - Hold amiodarone  - Consider IV infusion amio of a-fib recurs  - Holding a/c  -  Okay to discontinue telemetry  PE/DVT  - Hold A/C for now  - Has IVC filter  - INR supra-therapeutic. Will not reverse unless imminent surgery  - Consider heparin gtt once INR < 2   Hypothyroidism, stable.  Continue IV synthroid. (on 150mg  PO so 75mg  IV)   Spinal stenosis with increasing leg pains  - Will need neurology follow up  Diet: NPO except ice chips  Access: PIV  IVF: yes  Proph: SCDs and therapeutic warfarin   Code Status: DNR  Family Communication: patient and daughter Disposition Plan:  Pending medically stable.    Consultants:  General surgery  Procedures:  CT abd/pelvis  Antibiotics:  Pen G  Flagyl   Azithromycin    HPI/Subjective:  Persistent sore throat. Abdominal pain and nausea improved. Has passed a little bit of gas. Denies bowel movement.  Objective: Filed Vitals:   01/17/13 2105 01/18/13 0217 01/18/13 0536 01/18/13 0953  BP: 112/34 99/33 120/51 122/48  Pulse: 71 67 68 66  Temp: 98.8 F (37.1 C) 98.3 F (36.8 C) 99.4 F (37.4 C) 98.2 F (36.8 C)  TempSrc: Oral Oral Oral Oral  Resp: 18 18 18 18   Height:      Weight:      SpO2: 94% 94% 91% 95%    Intake/Output Summary (Last 24 hours) at 01/18/13 1413 Last data filed at 01/18/13 0700  Gross per 24 hour  Intake   2175 ml  Output    350 ml  Net   1825 ml   Filed Weights   01/17/13 1217  Weight: 62.1 kg (136 lb 14.5 oz)    Exam:   General:  Thin female, No acute distress  HEENT:  NCAT, MMM  Cardiovascular:  RRR, nl S1, S2 no mrg, 2+  pulses, warm extremities  Respiratory:  Diminished breath sounds at the left base with faint rales, no rhonchi or wheeze, no increased WOB  Abdomen:   + BS, soft, mildly distended, nontender  MSK:   Normal tone and bulk, no LEE  Neuro:  Grossly intact  Data Reviewed: Basic Metabolic Panel:  Recent Labs Lab 01/17/13 0632 01/18/13 0527  NA 142 141  K 4.5 4.1  CL 101 106  CO2 29 26  GLUCOSE 141* 119*  BUN 45* 33*  CREATININE 1.36* 1.27*  CALCIUM 10.0 8.3*   Liver Function Tests:  Recent Labs Lab 01/17/13 0632  AST 30  ALT 23  ALKPHOS 100  BILITOT 0.5  PROT 7.5  ALBUMIN 4.2    Recent Labs Lab 01/17/13 0632  LIPASE 36    No results found for this basename: AMMONIA,  in the last 168 hours CBC:  Recent Labs Lab 01/17/13 0632 01/18/13 0527  WBC 13.1* 10.7*  NEUTROABS 11.5*  --   HGB 13.0 10.7*  HCT 39.3 33.5*  MCV 93.1 93.6  PLT 229 201   Cardiac Enzymes:  Recent Labs Lab 01/17/13 1247 01/17/13 1834 01/18/13 0054  TROPONINI <0.30 <0.30 <0.30   BNP (last 3 results) No results found for this basename: PROBNP,  in the last 8760 hours CBG: No results found for this basename: GLUCAP,  in the last 168 hours  No results found for this or any previous visit (from the past 240 hour(s)).   Studies: Ct Abdomen Pelvis W Contrast  01/17/2013   CLINICAL DATA:  Abdominal pain with emesis this morning. History of megacolon. Mild leukocytosis.  EXAM: CT ABDOMEN AND PELVIS WITH CONTRAST  TECHNIQUE: Multidetector CT imaging of the abdomen and pelvis was performed using the standard protocol following bolus administration of intravenous contrast.  CONTRAST:  16mL OMNIPAQUE IOHEXOL 300 MG/ML SOLN, 70mL OMNIPAQUE IOHEXOL 300 MG/ML SOLN  COMPARISON:  Ultrasound 04/25/2010.  CT 01/01/2010.  FINDINGS: On initial scanning, the IV tubing leaked resulting in imaging prior to contrast administration. After securing the IV line, contrast was reinjected and the scan was repeated. Patient was not charged for the noncontrast portion of this study.  There is new patchy airspace disease in the right middle lobe. Patchy left lower lobe airspace disease is similar to the prior examination. There is mild underlying emphysema. No significant pleural or pericardial effusion is present.  The liver, gallbladder and biliary system appear unremarkable. There is chronic fatty replacement of the pancreas without focal abnormality. The spleen, adrenal glands and kidneys appear unremarkable.  There is moderate dilatation of the stomach and proximal to mid small bowel. The distal small bowel is decompressed. Area of transition appears to be in the  right false pelvis where there appears to be some twirling of the mesentery on the sagittal images, suggesting a possible internal hernia. No bowel wall hernias are identified. No surrounding mass is identified. There is no small bowel wall thickening, extraluminal fluid collection or pneumatosis.  The colon is decompressed. There is sigmoid colon atherosclerosis without surrounding inflammatory change. The uterus, ovaries and bladder appear normal.  There is diffuse aortoiliac atherosclerosis. IVC filter is again noted with penetration of the limbs beyond on the walls. There is no retroperitoneal hematoma or lymphadenopathy.  Thoracolumbar scoliosis and spondylosis are stable. There is stable chronic right femoral head avascular necrosis without subchondral collapse.  IMPRESSION: 1. Distal small bowel obstruction with apparent area of transition in the right false pelvis. There is a possible internal hernia  in this region. No bowel wall hernia identified. 2. No evidence of perforation or abscess. 3. Sigmoid colon diverticulosis. 4. Right middle and left lower lobe airspace disease suspicious for pneumonia.   Electronically Signed   By: Camie Patience M.D.   On: 01/17/2013 09:09   Dg Chest Port 1 View  01/17/2013   CLINICAL DATA:  CT scan revealed abnormal opacities at the lung bases.  EXAM: PORTABLE CHEST - 1 VIEW  COMPARISON:  01/17/2013; 10/13/2012  FINDINGS: Hazy airspace opacities are present in the right middle lobe, left lower lobe, and possibly the lingula. These are new compared to 10/13/12.  Heart size within normal limits.  Nasogastric tube tip:  Stomach.  IMPRESSION: 1. Right middle lobe, left lower lobe, and possibly lingular airspace opacities favoring multi lobar pneumonia.   Electronically Signed   By: Sherryl Barters M.D.   On: 01/17/2013 11:49   Dg Abd Portable 1v  01/18/2013   CLINICAL DATA:  Distended abdomen with abdominal pain.  EXAM: PORTABLE ABDOMEN - 1 VIEW  COMPARISON:  01/17/2013.   FINDINGS: The small bowel obstruction pattern persists, with maximal diameter of small bowel loops in the central abdomen measuring 43 mm. No gross plain film evidence of free air on this supine radiograph. IVC filter again noted. Stool and bowel gas remains present within the rectosigmoid. Bilateral femoral head AVN is incidentally noted.  IMPRESSION: Persistent small bowel obstruction. Maximal diameter of small bowel is 43 mm.   Electronically Signed   By: Dereck Ligas M.D.   On: 01/18/2013 07:59    Scheduled Meds: . antiseptic oral rinse  15 mL Mouth Rinse q12n4p  . azithromycin  500 mg Intravenous Q24H  . chlorhexidine  15 mL Mouth Rinse BID  . levothyroxine  75 mcg Intravenous Daily  . metronidazole  500 mg Intravenous Q8H  . pencillin G potassium IV  1.5 Million Units Intravenous Q6H  . sodium chloride  3 mL Intravenous Q12H   Continuous Infusions: . sodium chloride 100 mL/hr at 01/18/13 0330  . sodium chloride 100 mL/hr at 01/17/13 1320    Principal Problem:   SBO (small bowel obstruction) Active Problems:   Unspecified disorder of thyroid   Atrial fibrillation   Pulmonary embolus   Nausea and vomiting   Microscopic colitis   Leukocytosis   CKD (chronic kidney disease), stage IV   Aspiration pneumonia    Time spent: 30 min    Ronnell Makarewicz, [facility] Pager [phone]. If 7PM-7AM, please contact night-coverage at [email or web address], password [facility] 01/18/2013, 2:13 PM  LOS: 1 day

## 2013-01-19 DIAGNOSIS — M129 Arthropathy, unspecified: Secondary | ICD-10-CM

## 2013-01-19 LAB — TYPE AND SCREEN
ABO/RH(D): B POS
ANTIBODY SCREEN: NEGATIVE

## 2013-01-19 LAB — PROTIME-INR
INR: 2.04 — ABNORMAL HIGH (ref 0.00–1.49)
PROTHROMBIN TIME: 22.4 s — AB (ref 11.6–15.2)

## 2013-01-19 LAB — CBC
HCT: 32.8 % — ABNORMAL LOW (ref 36.0–46.0)
Hemoglobin: 10.7 g/dL — ABNORMAL LOW (ref 12.0–15.0)
MCH: 30.7 pg (ref 26.0–34.0)
MCHC: 32.6 g/dL (ref 30.0–36.0)
MCV: 94.3 fL (ref 78.0–100.0)
PLATELETS: 186 10*3/uL (ref 150–400)
RBC: 3.48 MIL/uL — AB (ref 3.87–5.11)
RDW: 15 % (ref 11.5–15.5)
WBC: 9.9 10*3/uL (ref 4.0–10.5)

## 2013-01-19 LAB — BASIC METABOLIC PANEL
BUN: 22 mg/dL (ref 6–23)
CALCIUM: 8.4 mg/dL (ref 8.4–10.5)
CO2: 24 mEq/L (ref 19–32)
CREATININE: 0.97 mg/dL (ref 0.50–1.10)
Chloride: 105 mEq/L (ref 96–112)
GFR calc Af Amer: 58 mL/min — ABNORMAL LOW (ref >90)
GFR, EST NON AFRICAN AMERICAN: 50 mL/min — AB (ref >90)
Glucose, Bld: 88 mg/dL (ref 70–99)
Potassium: 3.7 mEq/L (ref 3.7–5.3)
Sodium: 142 mEq/L (ref 137–147)

## 2013-01-19 LAB — ABO/RH: ABO/RH(D): B POS

## 2013-01-19 NOTE — Progress Notes (Signed)
NGT advanced 6 cm per request of Forrest Moron, NP. Drainage from NGT now light  Brown in color. NGT placement recheck by ausculation. Will continue to monitor.

## 2013-01-19 NOTE — Progress Notes (Signed)
Seen and agree  

## 2013-01-19 NOTE — Progress Notes (Signed)
Subjective: Had some gas this AM, no BM, feels better, less distended.  Objective: Vital signs in last 24 hours: Temp:  [98.2 F (36.8 C)-98.9 F (37.2 C)] 98.6 F (37 C) (01/06 0700) Pulse Rate:  [66-97] 72 (01/06 0700) Resp:  [18-20] 20 (01/06 0700) BP: (114-142)/(44-58) 129/47 mmHg (01/06 0700) SpO2:  [91 %-97 %] 94 % (01/06 0700) Last BM Date: 01/16/13 Bleeding from NG last PM; supra theraputic coumadin, with INR 3.99 Transfused FFP yesterday Afebrile, VSS Labs are active orders, but no results so far. Nothing recorded from NG yesterday NPO Intake/Output from previous day: 01/05 0701 - 01/06 0700 In: 3126 [I.V.:2085; Blood:541; NG/GT:50; IV Piggyback:450] Out: 975 [Urine:975] Intake/Output this shift:    General appearance: alert, cooperative and no distress Resp: clear to auscultation bilaterally GI: soft, some mild distension., hypoactive BS, some flatus this AM  Lab Results:   Recent Labs  01/18/13 0527 01/18/13 2230  WBC 10.7* 10.6*  HGB 10.7* 10.9*  HCT 33.5* 34.5*  PLT 201 180    BMET  Recent Labs  01/18/13 0527 01/18/13 2230  NA 141 141  K 4.1 3.7  CL 106 106  CO2 26 25  GLUCOSE 119* 103*  BUN 33* 26*  CREATININE 1.27* 1.10  CALCIUM 8.3* 8.2*   PT/INR  Recent Labs  01/18/13 0527 01/18/13 2230  LABPROT 38.5* 37.4*  INR 4.15* 3.99*     Recent Labs Lab 01/17/13 0632 01/18/13 2230  AST 30 29  ALT 23 31  ALKPHOS 100 66  BILITOT 0.5 0.7  PROT 7.5 5.7*  ALBUMIN 4.2 3.0*     Lipase     Component Value Date/Time   LIPASE 36 01/17/2013 [id number]     Studies/Results: Dg Chest Port 1 View  01/17/2013   CLINICAL DATA:  CT scan revealed abnormal opacities at the lung bases.  EXAM: PORTABLE CHEST - 1 VIEW  COMPARISON:  01/17/2013; 10/13/2012  FINDINGS: Hazy airspace opacities are present in the right middle lobe, left lower lobe, and possibly the lingula. These are new compared to 10/13/12.  Heart size within normal limits.  Nasogastric  tube tip:  Stomach.  IMPRESSION: 1. Right middle lobe, left lower lobe, and possibly lingular airspace opacities favoring multi lobar pneumonia.   Electronically Signed   By: Sherryl Barters M.D.   On: 01/17/2013 11:49   Dg Abd Portable 1v  01/18/2013   CLINICAL DATA:  Small bowel obstruction  EXAM: PORTABLE ABDOMEN - 1 VIEW  COMPARISON:  Study obtained earlier in the day  FINDINGS: Nasogastric tube tip is in the proximal stomach with the side port at the gastroesophageal junction. There is less bowel dilatation compared to earlier in the day. There is contrast in the small bowel and cecum. Inferior vena cava filter position unchanged. There is scoliosis and degenerative change in the lumbar spine. No free air is seen on this supine examination.  IMPRESSION: Nasogastric tube tip in proximal stomach with side port at the gastroesophageal junction. Advise advancing nasogastric tube 6 to 8 cm.  Less bowel dilatation compared to earlier in the day. Contrast is seen in the cecum.  No free air seen on this supine examination.  Stable lumbar arthropathy.   Electronically Signed   By: Lowella Grip M.D.   On: 01/18/2013 23:03   Dg Abd Portable 1v  01/18/2013   CLINICAL DATA:  Distended abdomen with abdominal pain.  EXAM: PORTABLE ABDOMEN - 1 VIEW  COMPARISON:  01/17/2013.  FINDINGS: The small bowel obstruction pattern persists,  with maximal diameter of small bowel loops in the central abdomen measuring 43 mm. No gross plain film evidence of free air on this supine radiograph. IVC filter again noted. Stool and bowel gas remains present within the rectosigmoid. Bilateral femoral head AVN is incidentally noted.  IMPRESSION: Persistent small bowel obstruction. Maximal diameter of small bowel is 43 mm.   Electronically Signed   By: Dereck Ligas M.D.   On: 01/18/2013 07:59    Medications: . antiseptic oral rinse  15 mL Mouth Rinse q12n4p  . azithromycin  500 mg Intravenous Q24H  . chlorhexidine  15 mL Mouth  Rinse BID  . levothyroxine  75 mcg Intravenous Daily  . metronidazole  500 mg Intravenous Q8H  . pantoprazole (PROTONIX) IV  40 mg Intravenous Q12H  . pencillin G potassium IV  1.5 Million Units Intravenous Q6H  . sodium chloride  3 mL Intravenous Q12H    Assessment/Plan SBO (small bowel obstruction)  hypothyroid  Atrial fibrillation  Pulmonary embolus  Nausea and vomiting  Microscopic colitis  Leukocytosis  CKD (chronic kidney disease), stage IV   Labs just being drawn, she is better but I do not want to pull NG yet.  With her having some bleeding last PM and supratheraputic INR I would just leave things as they are.  Continue sips and chips for now.       LOS: 2 days    Aijalon Demuro 01/19/2013

## 2013-01-19 NOTE — Progress Notes (Signed)
Pt with approximately 75cc of bright red blood from NGT since shift change. VSS. Pt states "I had some pink tinged drainage from NGT earlier today, but not this color." Forrest Moron, NP notified. New orders placed in Epic. Will continue to monitor.

## 2013-01-19 NOTE — Evaluation (Signed)
Occupational Therapy Evaluation Patient Details Name: [name] MRN: [id number] DOB: 1920/12/12 Today's Date: 01/19/2013 Time: [id number] OT Time Calculation (min): 30 min  OT Assessment / Plan / Recommendation History of present illness Pt is a 80 year old female who presented to the emergency department this morning after developing abdominal pain nausea and vomiting overnight. Her family is present in the room. They report multiple episodes of emesis. She described pain as cramping and the feeling like there was a watermelon on her stomach. The pain started centrally and in the upper abdomen. There is no hematemesis. She had a bowel movement last evening but has not passed flatus since. She had a similar episode in 2011 which resolved without the need for surgery or even an NG tube. Currently, she is more comfortable since a nasogastric tube is in place. She is otherwise without complaints. She denies shortness of breath or chest pain. There have been no fevers or chills.   Clinical Impression   Pt admitted for the above diagnosis and has the minor deficits listed below.  Pt would benefit from cont OT to increase I with basic adls so she can return home alone.    OT Assessment  Patient needs continued OT Services    Follow Up Recommendations  No OT follow up;Supervision - Intermittent    Barriers to Discharge Decreased caregiver support has family close if needed.  Equipment Recommendations  None recommended by OT    Recommendations for Other Services    Frequency  Min 2X/week    Precautions / Restrictions Precautions Precautions: Fall Precaution Comments: NG tube Restrictions Weight Bearing Restrictions: No   Pertinent Vitals/Pain Pt did not c/o pain.    ADL  Eating/Feeding: NPO Grooming: Performed;Set up Where Assessed - Grooming: Unsupported standing Upper Body Bathing: Simulated;Set up Where Assessed - Upper Body Bathing: Unsupported sitting Lower Body Bathing:  Simulated;Set up Where Assessed - Lower Body Bathing: Supported sit to stand Upper Body Dressing: Performed;Set up Where Assessed - Upper Body Dressing: Unsupported sitting Lower Body Dressing: Performed;Supervision/safety Where Assessed - Lower Body Dressing: Supported sit to stand Toilet Transfer: Chartered loss adjuster Method: Sit to Loss adjuster, chartered: Comfort height toilet;Grab bars Toileting - Water quality scientist and Hygiene: Performed;Supervision/safety Where Assessed - Best boy and Hygiene: Standing Equipment Used: Rolling walker Transfers/Ambulation Related to ADLs: Pt walked in room and in hallway with S.  S provided and needed due to pt's vision.  Feel in her own enviroment she would not need as much S as it would be familiar to her. ADL Comments: Pt does well with adls.  Cues provided simply due to vision.  Pt has a particular way she does things at home due to vision and that has changed here.    OT Diagnosis: Generalized weakness;Blindness and low vision  OT Problem List: Impaired vision/perception;Impaired balance (sitting and/or standing) OT Treatment Interventions: Self-care/ADL training;Therapeutic activities   OT Goals(Current goals can be found in the care plan section) Acute Rehab OT Goals Patient Stated Goal: to get back home. OT Goal Formulation: With patient/family Time For Goal Achievement: 01/26/13 Potential to Achieve Goals: Good ADL Goals Pt Will Perform Tub/Shower Transfer: Shower transfer;ambulating;rolling walker;with supervision Additional ADL Goal #1: Pt will complete all toileting on 3:1 over commode with mod I. Additional ADL Goal #2: Pt will gather clothes and donn all clothes with min VCs only for vision.  Visit Information  Last OT Received On: 01/19/13 Assistance Needed: +1 History of Present Illness: Pt is  a 80 year old female who presented to the emergency department this morning  after developing abdominal pain nausea and vomiting overnight. Her family is present in the room. They report multiple episodes of emesis. She described pain as cramping and the feeling like there was a watermelon on her stomach. The pain started centrally and in the upper abdomen. There is no hematemesis. She had a bowel movement last evening but has not passed flatus since. She had a similar episode in 2011 which resolved without the need for surgery or even an NG tube. Currently, she is more comfortable since a nasogastric tube is in place. She is otherwise without complaints. She denies shortness of breath or chest pain. There have been no fevers or chills.       Prior [place] expects to be discharged to:: Private residence Living Arrangements: Alone Available Help at Discharge: Family;Available PRN/intermittently Type of Home: House Home Access: Stairs to enter Entrance Stairs-Number of Steps: 3 Entrance Stairs-Rails: Right Home Layout: Able to live on main level with bedroom/bathroom;Two level Alternate Level Stairs-Number of Steps: 12 Alternate Level Stairs-Rails: Left Home Equipment: Walker - 4 wheels;Shower seat;Bedside commode Prior Function Level of Independence: Independent with assistive device(s) Comments: Pt does much better in own environment due to macular degernation Communication Communication: No difficulties Dominant Hand: Right         Vision/Perception Vision - History Baseline Vision: Legally blind Visual History: Macular degeneration Patient Visual Report: No change from baseline Vision - Assessment Vision Assessment: Vision not tested   Cognition  Cognition Arousal/Alertness: Awake/alert Behavior During Therapy: WFL for tasks assessed/performed Overall Cognitive Status: Within Functional Limits for tasks assessed    Extremity/Trunk Assessment Upper Extremity Assessment Upper Extremity Assessment: Overall WFL for  tasks assessed Lower Extremity Assessment Lower Extremity Assessment: Defer to PT evaluation Cervical / Trunk Assessment Cervical / Trunk Assessment: Normal     Mobility Bed Mobility Bed Mobility: Supine to Sit Supine to Sit: 6: Modified independent (Device/Increase time);HOB elevated Transfers Transfers: Sit to Stand;Stand to Sit Sit to Stand: 5: Supervision;From bed Stand to Sit: 5: Supervision;To chair/3-in-1 Details for Transfer Assistance: verbal cues for hand placement     Exercise     Balance Balance Balance Assessed: Yes Static Standing Balance Static Standing - Balance Support: During functional activity Static Standing - Level of Assistance: 5: Stand by assistance Static Standing - Comment/# of Minutes: 4   End of Session OT - End of Session Equipment Utilized During Treatment: Rolling walker Activity Tolerance: Patient tolerated treatment well Patient left: in chair;with call bell/phone within reach Nurse Communication: Mobility status  GO     Glenford Peers 01/19/2013, 10:33 AM [phone]

## 2013-01-19 NOTE — Progress Notes (Signed)
[facility] PROGRESS NOTE  [name] [id number] DOB: 1920-07-06 DOA: 01/17/2013 PCP: [name], MD  Assessment/Plan  SBO with possible internal hernia.  Passing gas.  Had episode of bleeding overnight 1/5 > 1/6 at which time she was given FFP and NG repositioned.  Bleeding has improved.   - Appreciate General surgery recommendations and assistance  - Continue NG to LIS  - Continue IVF  - Continue Zofran and phenergan prn  - Continue very low dose dilaudid for pain  - Frequent ambulation  - IS  - Ice chips  - Hold oral medications  - continue lozenges -  Meant to contact Dr. Watt Climes today per family's request, but currently 7:50PM.  Will notify them in the AM that patient would like to be seen during admission.    Chronic kidney disease, base creatinine 1.2-1.3 with CrCl 20-30. Creatinine trended down from 1.36 and BUN also decreased with IV fluids - Minimize nephrotoxins  - Renally dose medications  - decrease IVF - Repeat creatinine in AM   Leukocytosis, likely due to SBO and possible PNA, trending down  Community-acquired versus aspiration PNA on CT with rales at left base. CXR confirms multilobar PNA. Due to episode of severe diarrhea with antibiotics in the past, discussed regimen of antibiotics which included IV Flagyl with pharmacy. Have agreed to use penicillin G plus azithromycin plus Flagyl. Azithromycin can be discontinued after third dose. -  Continue Pen G + azithro + flagyl day 3 - urine legionella and S. pneumo both negative  PAF, on A/C and rhythm controlled with amio.  Very symptomatic with episodes of atrial fibrillation. - Check ECG: NSR with new inverted T-waves in V4-6, nonspecific and may be related to nausea/stomach distension -  Troponins negative - Telemetry:  Normal sinus rhythm - Hold amiodarone  - Consider IV infusion amio of a-fib recurs  - Holding a/c.   PE/DVT  - Hold A/C for now  - Has IVC filter  - INR supra-therapeutic and due  to bleeding overnight, received FFP x 2  Hypothyroidism, stable. Continue IV synthroid. (on 150mg  PO so 75mg  IV)   Spinal stenosis with increasing leg pains  - Will need neurology follow up  Diet: NPO except ice chips  Access: PIV  IVF: yes  Proph: SCDs and therapeutic warfarin   Code Status: DNR  Family Communication: patient and daughter Disposition Plan:  Pending medically stable.    Consultants:  General surgery  Procedures:  CT abd/pelvis  Antibiotics:  Pen G  Flagyl   Azithromycin    HPI/Subjective:  Didn't sleep well because of bleeding from NG which needed repositioning and IV kept beeping.   Tired today.  Objective: Filed Vitals:   01/19/13 0530 01/19/13 0600 01/19/13 0700 01/19/13 1320  BP: 142/48 127/58 129/47 159/57  Pulse: 72 97 72 70  Temp: 98.8 F (37.1 C) 98.5 F (36.9 C) 98.6 F (37 C) 98.2 F (36.8 C)  TempSrc: Oral Oral Oral Oral  Resp: 20 20 20 20   Height:      Weight:      SpO2: 96% 95% 94% 92%    Intake/Output Summary (Last 24 hours) at 01/19/13 1950 Last data filed at 01/19/13 1800  Gross per 24 hour  Intake   3291 ml  Output    800 ml  Net   2491 ml   Filed Weights   01/17/13 1217  Weight: 62.1 kg (136 lb 14.5 oz)    Exam:   General:  Thin female,  No acute distress, tired appearing  HEENT:  NCAT, MMM  Cardiovascular:  RRR, nl S1, S2 no mrg, 2+ pulses, warm extremities  Respiratory:  Diminished breath sounds at the left base with faint rales, no rhonchi or wheeze, no increased WOB  Abdomen:   + BS, soft, mildly distended, nontender  MSK:   Normal tone and bulk, no LEE  Neuro:  Grossly intact  Data Reviewed: Basic Metabolic Panel:  Recent Labs Lab 01/17/13 0632 01/18/13 0527 01/18/13 2230 01/19/13 0950  NA 142 141 141 142  K 4.5 4.1 3.7 3.7  CL 101 106 106 105  CO2 29 26 25 24   GLUCOSE 141* 119* 103* 88  BUN 45* 33* 26* 22  CREATININE 1.36* 1.27* 1.10 0.97  CALCIUM 10.0 8.3* 8.2* 8.4   Liver  Function Tests:  Recent Labs Lab 01/17/13 0632 01/18/13 2230  AST 30 29  ALT 23 31  ALKPHOS 100 66  BILITOT 0.5 0.7  PROT 7.5 5.7*  ALBUMIN 4.2 3.0*    Recent Labs Lab 01/17/13 [id number]  LIPASE 36   No results found for this basename: AMMONIA,  in the last 168 hours CBC:  Recent Labs Lab 01/17/13 0632 01/18/13 0527 01/18/13 2230 01/19/13 0950  WBC 13.1* 10.7* 10.6* 9.9  NEUTROABS 11.5*  --   --   --   HGB 13.0 10.7* 10.9* 10.7*  HCT 39.3 33.5* 34.5* 32.8*  MCV 93.1 93.6 94.5 94.3  PLT 229 201 180 186   Cardiac Enzymes:  Recent Labs Lab 01/17/13 1247 01/17/13 1834 01/18/13 0054  TROPONINI <0.30 <0.30 <0.30   BNP (last 3 results) No results found for this basename: PROBNP,  in the last 8760 hours CBG: No results found for this basename: GLUCAP,  in the last 168 hours  No results found for this or any previous visit (from the past 240 hour(s)).   Studies: Dg Abd Portable 1v  01/18/2013   CLINICAL DATA:  Small bowel obstruction  EXAM: PORTABLE ABDOMEN - 1 VIEW  COMPARISON:  Study obtained earlier in the day  FINDINGS: Nasogastric tube tip is in the proximal stomach with the side port at the gastroesophageal junction. There is less bowel dilatation compared to earlier in the day. There is contrast in the small bowel and cecum. Inferior vena cava filter position unchanged. There is scoliosis and degenerative change in the lumbar spine. No free air is seen on this supine examination.  IMPRESSION: Nasogastric tube tip in proximal stomach with side port at the gastroesophageal junction. Advise advancing nasogastric tube 6 to 8 cm.  Less bowel dilatation compared to earlier in the day. Contrast is seen in the cecum.  No free air seen on this supine examination.  Stable lumbar arthropathy.   Electronically Signed   By: Lowella Grip M.D.   On: 01/18/2013 23:03   Dg Abd Portable 1v  01/18/2013   CLINICAL DATA:  Distended abdomen with abdominal pain.  EXAM: PORTABLE ABDOMEN -  1 VIEW  COMPARISON:  01/17/2013.  FINDINGS: The small bowel obstruction pattern persists, with maximal diameter of small bowel loops in the central abdomen measuring 43 mm. No gross plain film evidence of free air on this supine radiograph. IVC filter again noted. Stool and bowel gas remains present within the rectosigmoid. Bilateral femoral head AVN is incidentally noted.  IMPRESSION: Persistent small bowel obstruction. Maximal diameter of small bowel is 43 mm.   Electronically Signed   By: Dereck Ligas M.D.   On: 01/18/2013 07:59  Scheduled Meds: . antiseptic oral rinse  15 mL Mouth Rinse q12n4p  . chlorhexidine  15 mL Mouth Rinse BID  . levothyroxine  75 mcg Intravenous Daily  . metronidazole  500 mg Intravenous Q8H  . pantoprazole (PROTONIX) IV  40 mg Intravenous Q12H  . pencillin G potassium IV  1.5 Million Units Intravenous Q6H  . sodium chloride  3 mL Intravenous Q12H   Continuous Infusions: . sodium chloride 100 mL/hr at 01/19/13 1037  . sodium chloride 100 mL/hr at 01/18/13 2005    Principal Problem:   SBO (small bowel obstruction) Active Problems:   Unspecified disorder of thyroid   Atrial fibrillation   Pulmonary embolus   Nausea and vomiting   Microscopic colitis   Leukocytosis   CKD (chronic kidney disease), stage IV   Aspiration pneumonia    Time spent: 30 min    Eluzer Howdeshell, [facility] Pager [phone]. If 7PM-7AM, please contact night-coverage at [email or web address], password [facility] 01/19/2013, 7:50 PM  LOS: 2 days

## 2013-01-20 DIAGNOSIS — E876 Hypokalemia: Secondary | ICD-10-CM

## 2013-01-20 LAB — BASIC METABOLIC PANEL
BUN: 19 mg/dL (ref 6–23)
CO2: 19 mEq/L (ref 19–32)
Calcium: 8.3 mg/dL — ABNORMAL LOW (ref 8.4–10.5)
Chloride: 107 mEq/L (ref 96–112)
Creatinine, Ser: 0.93 mg/dL (ref 0.50–1.10)
GFR, EST AFRICAN AMERICAN: 61 mL/min — AB (ref >90)
GFR, EST NON AFRICAN AMERICAN: 53 mL/min — AB (ref >90)
Glucose, Bld: 84 mg/dL (ref 70–99)
POTASSIUM: 3.5 meq/L — AB (ref 3.7–5.3)
Sodium: 142 mEq/L (ref 137–147)

## 2013-01-20 LAB — PREPARE FRESH FROZEN PLASMA
UNIT DIVISION: 0
Unit division: 0

## 2013-01-20 LAB — CBC
HCT: 34.5 % — ABNORMAL LOW (ref 36.0–46.0)
HEMOGLOBIN: 10.9 g/dL — AB (ref 12.0–15.0)
MCH: 29.9 pg (ref 26.0–34.0)
MCHC: 31.6 g/dL (ref 30.0–36.0)
MCV: 94.5 fL (ref 78.0–100.0)
Platelets: 199 10*3/uL (ref 150–400)
RBC: 3.65 MIL/uL — ABNORMAL LOW (ref 3.87–5.11)
RDW: 14.6 % (ref 11.5–15.5)
WBC: 9.7 10*3/uL (ref 4.0–10.5)

## 2013-01-20 LAB — PROTIME-INR
INR: 2.36 — AB (ref 0.00–1.49)
Prothrombin Time: 25 seconds — ABNORMAL HIGH (ref 11.6–15.2)

## 2013-01-20 MED ORDER — POTASSIUM CHLORIDE IN NACL 40-0.9 MEQ/L-% IV SOLN
INTRAVENOUS | Status: DC
  Administered 2013-01-20 – 2013-01-21 (×2): via INTRAVENOUS
  Filled 2013-01-20 (×3): qty 1000

## 2013-01-20 MED ORDER — DEXTROSE 5 % IV SOLN
500.0000 mg | INTRAVENOUS | Status: DC
  Administered 2013-01-20: 500 mg via INTRAVENOUS
  Filled 2013-01-20 (×2): qty 500

## 2013-01-20 MED ORDER — AMIODARONE HCL 100 MG PO TABS: 100.0000 mg | ORAL_TABLET | Freq: Every day | ORAL | Status: DC

## 2013-01-20 NOTE — Progress Notes (Signed)
ANTICOAGULATION CONSULT NOTE - Initial Consult  Pharmacy Consult for:  Lovenox Indication:  Paroxysmal atrial fibrillation, history of DVT and PE, Coumadin on hold due to small bowel obstruction  Allergies  Allergen Reactions  . Cefdinir     Caused bad diarrhea  . Cortisone     Flushed hands and face   . Prednisone     cuased blood clots  . Sulfonamide Derivatives     Stomach pains and cramping     Patient Measurements: Height: 5\' 6"  (167.6 cm) Weight: 136 lb 14.5 oz (62.1 kg) IBW/kg (Calculated) : 59.3  Vital Signs: Temp: 98 F (36.7 C) (01/07 1400) Temp src: Oral (01/07 1400) BP: 148/51 mmHg (01/07 1400) Pulse Rate: 64 (01/07 1400)  Labs:  Recent Labs  01/17/13 1834 01/18/13 0054  01/18/13 2230 01/19/13 0950 01/20/13 0604  HGB  --   --   < > 10.9* 10.7* 10.9*  HCT  --   --   < > 34.5* 32.8* 34.5*  PLT  --   --   < > 180 186 199  LABPROT  --   --   < > 37.4* 22.4* 25.0*  INR  --   --   < > 3.99* 2.04* 2.36*  CREATININE  --   --   < > 1.10 0.97 0.93  TROPONINI <0.30 <0.30  --   --   --   --   < > = values in this interval not displayed.  Estimated Creatinine Clearance: 37.6 ml/min (by C-G formula based on Cr of 0.93).   Medical History: Past Medical History  Diagnosis Date  . Hypothyroidism   . Colitis   . Blood clot associated with vein wall inflammation   . Pulmonary embolus     and DVT s/p IVC filter  . Paroxysmal atrial fibrillation   . Blindness   . SBO (small bowel obstruction)   . Temporal arteritis   . Goiter     s/p radioactive iod. tx  . Spinal stenosis of lumbar region with radiculopathy     possible neurogenic claudication recently?  Marland Kitchen PAF (paroxysmal atrial fibrillation)   . CKD (chronic kidney disease), stage IV     Medications:  Scheduled:  . antiseptic oral rinse  15 mL Mouth Rinse q12n4p  . azithromycin  500 mg Intravenous Q24H  . chlorhexidine  15 mL Mouth Rinse BID  . levothyroxine  75 mcg Intravenous Daily  .  metronidazole  500 mg Intravenous Q8H  . pantoprazole (PROTONIX) IV  40 mg Intravenous Q12H  . pencillin G potassium IV  1.5 Million Units Intravenous Q6H  . sodium chloride  3 mL Intravenous Q12H    Assessment:  Asked to assist with Lovenox therapy for this 80 year-old female with small bowel obstruction.   [name] has PAF and a history of DVT, PE, and IVC filter placement.  Prior to admission she received chronic anticoagulation with Coumadin;  the usual home dose is documented as 2.5 mg daily except for 1.25 mg on M-W-F.  Coumadin is currently on hold along with other oral medications.  The INR is within the therapeutic range today at 2.36.  Goals of Therapy:   Anti-Xa level 0.6-1 units/ml 4hrs after LMWH dose given  Monitor platelets by anticoagulation protocol: Yes  Prevention of thromboembolism   Plan:   Monitor PT/INR daily  Begin Lovenox when INR < 2 (discussed with Dr. Doyle Askew)  Allena Napoleon R.Ph. 01/20/2013 5:52 PM

## 2013-01-20 NOTE — Progress Notes (Signed)
Patient ambulated in hallway this am >400 feet with walker. Daughter helped assist. Patient tolerated well. Will continue to monitor and encourage ambulation. Setzer, Marchelle Folks

## 2013-01-20 NOTE — Progress Notes (Signed)
NG clamped per order. Patient has had 100 cc output since 0700. Family and patient educated about clamping the NG and when to call if patient is uncomfortable. Will continue to closely monitor patient. Setzer, Marchelle Folks

## 2013-01-20 NOTE — Progress Notes (Signed)
Patient ID: [name], female   DOB: 02-Oct-1920, 80 y.o.   MRN: [id number]  [facility] PROGRESS NOTE  [name] [id number] DOB: 1920/07/31 DOA: 01/17/2013 PCP: [name], MD  Brief narrative: 80 y.o. year-old female with history of PAF, DVT and PE on A/C and s/p IVC filter, microscopic colitis and hx of self-resolved SBO, temporal arteritis no longer on prednisone, hypothyroidism, blindness, spinal stenosis who presented to [facility] ED with sharp and constant LUQ abdominal pain that started several days PTA, associated with non bloody diarrhea and bilious vomiting.   In the ED, WBC 13.1, BUN 45 Cr 1.36, INR 2.64. CT scan of the abdomen and pelvis demonstrated a distal SBO with no perforation or abscess. Incidentally, right middle and left lower lobe airspace disease was noted and suspicious for PNA. General surgery has been consultative.  Principal Problem:   SBO (small bowel obstruction) - pt reports feeling better this AM but has not slept well - appreciate surgery and GI assistance - plan to take NGT in AM if pt continues doing well - ice chips allowed Active Problems:   Atrial fibrillation - rate controlled - HR 60 -70 over 24 hours   Pulmonary embolus - will resume Coumadin once pt able to take PO - INR is still in therapeutic range   Nausea and vomiting - secondary to SBO - continue to provide supportive care with analgesia as needed   Leukocytosis - secondary to PNA and SBO - WBC trending down and WNL this AM   CKD (chronic kidney disease), stage IV - remains stable and at baseline, WNL this AM - BMP in AM   Aspiration pneumonia - continue Zithromax, Flagyl, Pen G - pt maintaining oxygen saturation at target range    Hypokalemia - secondary to vomiting and diarrhea - will continue to supplement via IV and repeat BMP in AM  Consultants:  GI  Surgery   Procedures/Studies: Dg Abd Portable 1v   01/18/2013   Nasogastric tube tip in proximal stomach with  side port at the gastroesophageal junction. Advise advancing nasogastric tube 6 to 8 cm.  Less bowel dilatation compared to earlier in the day. Contrast is seen in the cecum.  No free air seen on this supine examination.    Antibiotics:  Flagyl 01/04 -->   Code Status: DNR Family Communication: Pt and daughter at bedside Disposition Plan: Home when medically stable  HPI/Subjective: No events overnight.   Objective: Filed Vitals:   01/19/13 1320 01/19/13 2245 01/20/13 0530 01/20/13 1400  BP: 159/57 140/44 140/48 148/51  Pulse: 70 66 70 64  Temp: 98.2 F (36.8 C) 98.5 F (36.9 C) 98 F (36.7 C) 98 F (36.7 C)  TempSrc: Oral Oral Oral Oral  Resp: 20 18 20 18   Height:      Weight:      SpO2: 92% 93% 90% 92%    Intake/Output Summary (Last 24 hours) at 01/20/13 1658 Last data filed at 01/20/13 1606  Gross per 24 hour  Intake   3915 ml  Output    825 ml  Net   3090 ml    Exam:   General:  Pt is alert, follows commands appropriately, not in acute distress  Cardiovascular: Regular rate and rhythm, S1/S2, no murmurs, no rubs, no gallops  Respiratory: Clear to auscultation bilaterally, no wheezing, no crackles, no rhonchi  Abdomen: Soft, non tender, non distended, bowel sounds present, no guarding, NGT in place   Extremities: No edema, pulses DP and  PT palpable bilaterally  Neuro: Grossly nonfocal  Data Reviewed: Basic Metabolic Panel:  Recent Labs Lab 01/17/13 0632 01/18/13 0527 01/18/13 2230 01/19/13 0950 01/20/13 0604  NA 142 141 141 142 142  K 4.5 4.1 3.7 3.7 3.5*  CL 101 106 106 105 107  CO2 29 26 25 24 19   GLUCOSE 141* 119* 103* 88 84  BUN 45* 33* 26* 22 19  CREATININE 1.36* 1.27* 1.10 0.97 0.93  CALCIUM 10.0 8.3* 8.2* 8.4 8.3*   Liver Function Tests:  Recent Labs Lab 01/17/13 0632 01/18/13 2230  AST 30 29  ALT 23 31  ALKPHOS 100 66  BILITOT 0.5 0.7  PROT 7.5 5.7*  ALBUMIN 4.2 3.0*    Recent Labs Lab 01/17/13 0632  LIPASE 36    CBC:  Recent Labs Lab 01/17/13 0632 01/18/13 0527 01/18/13 2230 01/19/13 0950 01/20/13 0604  WBC 13.1* 10.7* 10.6* 9.9 9.7  NEUTROABS 11.5*  --   --   --   --   HGB 13.0 10.7* 10.9* 10.7* 10.9*  HCT 39.3 33.5* 34.5* 32.8* 34.5*  MCV 93.1 93.6 94.5 94.3 94.5  PLT 229 201 180 186 199   Cardiac Enzymes:  Recent Labs Lab 01/17/13 1247 01/17/13 1834 01/18/13 0054  TROPONINI <0.30 <0.30 <0.30   Scheduled Meds: . levothyroxine  75 mcg Intravenous Daily  . metronidazole  500 mg Intravenous Q8H  . pantoprazoleIV  40 mg Intravenous Q12H   Continuous Infusions: . 0.9 % NaCl with KCl 40 mEq / L 75 mL/hr at 01/20/13 1606   Faye Ramsay, MD  [place] Pager [phone]  If 7PM-7AM, please contact night-coverage [email or web address] Password [email or web address] 01/20/2013, 4:58 PM   LOS: 3 days

## 2013-01-20 NOTE — Progress Notes (Signed)
Subjective: Feels better, had a BM, ready for NG to come out  Objective: Vital signs in last 24 hours: Temp:  [98 F (36.7 C)-98.5 F (36.9 C)] 98 F (36.7 C) (01/07 0530) Pulse Rate:  [66-70] 70 (01/07 0530) Resp:  [18-20] 20 (01/07 0530) BP: (140)/(44-48) 140/48 mmHg (01/07 0530) SpO2:  [90 %-93 %] 90 % (01/07 0530) Last BM Date: 01/16/13 200 from the NG Afebrile, VSS K+3.5 INR 2.36 Intake/Output from previous day: 01/06 0701 - 01/07 0700 In: 1925 [I.V.:1425; NG/GT:50; IV Piggyback:450] Out: 375 [Urine:175; Emesis/NG output:200] Intake/Output this shift: Total I/O In: 0  Out: 150 [Urine:150]  General appearance: alert, cooperative and no distress GI: few BS, soft, not tender or very distended, she is up in chair.  Lab Results:   Recent Labs  01/19/13 0950 01/20/13 0604  WBC 9.9 9.7  HGB 10.7* 10.9*  HCT 32.8* 34.5*  PLT 186 199    BMET  Recent Labs  01/19/13 0950 01/20/13 0604  NA 142 142  K 3.7 3.5*  CL 105 107  CO2 24 19  GLUCOSE 88 84  BUN 22 19  CREATININE 0.97 0.93  CALCIUM 8.4 8.3*   PT/INR  Recent Labs  01/19/13 0950 01/20/13 0604  LABPROT 22.4* 25.0*  INR 2.04* 2.36*     Recent Labs Lab 01/17/13 [id number] 01/18/13 2230  AST 30 29  ALT 23 31  ALKPHOS 100 66  BILITOT 0.5 0.7  PROT 7.5 5.7*  ALBUMIN 4.2 3.0*     Lipase     Component Value Date/Time   LIPASE 36 01/17/2013 [id number]     Studies/Results: Dg Abd Portable 1v  01/18/2013   CLINICAL DATA:  Small bowel obstruction  EXAM: PORTABLE ABDOMEN - 1 VIEW  COMPARISON:  Study obtained earlier in the day  FINDINGS: Nasogastric tube tip is in the proximal stomach with the side port at the gastroesophageal junction. There is less bowel dilatation compared to earlier in the day. There is contrast in the small bowel and cecum. Inferior vena cava filter position unchanged. There is scoliosis and degenerative change in the lumbar spine. No free air is seen on this supine examination.   IMPRESSION: Nasogastric tube tip in proximal stomach with side port at the gastroesophageal junction. Advise advancing nasogastric tube 6 to 8 cm.  Less bowel dilatation compared to earlier in the day. Contrast is seen in the cecum.  No free air seen on this supine examination.  Stable lumbar arthropathy.   Electronically Signed   By: Lowella Grip M.D.   On: 01/18/2013 23:03    Medications: . antiseptic oral rinse  15 mL Mouth Rinse q12n4p  . chlorhexidine  15 mL Mouth Rinse BID  . levothyroxine  75 mcg Intravenous Daily  . metronidazole  500 mg Intravenous Q8H  . pantoprazole (PROTONIX) IV  40 mg Intravenous Q12H  . pencillin G potassium IV  1.5 Million Units Intravenous Q6H  . sodium chloride  3 mL Intravenous Q12H   . sodium chloride 75 mL/hr at 01/19/13 2008    Assessment/Plan SBO (small bowel obstruction)  microscopic colitis well-managed on 5-ASA  hypothyroid  Atrial fibrillation  Pulmonary embolus  Nausea and vomiting  Microscopic colitis  Leukocytosis  CKD (chronic kidney disease), stage IV  Hypokalemia    Plan:  Add some Potassium to IV, ice chips and clamp NG.  It hasn't put much out.  If OK d/c NG tube  in AM, and advance diet    LOS: 3 days  Dosha Broshears 01/20/2013

## 2013-01-20 NOTE — Progress Notes (Signed)
[name] 10:10 AM  Subjective: Patient frustrated with her hospital course and her NG but truly doing better overall without pain and passing minimal gas and patient well-known to me from long-term GI followup for microscopic colitis well-managed on 5-ASA and one previous episode of a partial small bowel obstruction in 2011 unfortunately previous CAT scan report not readily available on computer and her case was discussed with one of her daughters as well as the patient and patient has had previous hysterectomy and appendectomy  Objective: Vital signs stable afebrile no acute distress abdomen is soft nontender rare bowel sound labs and x-rays reviewed and decreased NG output over the last 24 hour  Assessment: Small bowel obstruction probably secondary to adhesions versus internal hernia  Plan: Reassurance was given will continue to follow with you leave care to surgeon include timing of surgery or when to clamp NG and try clear liquid and restart by mouth medicines when able and consider TPN if prolonged NG suction or surgical options are needed and okay with me to add Lovenox or subcutaneous heparin when needed or resume Coumadin when able to take by mouth  [facility] E

## 2013-01-21 ENCOUNTER — Inpatient Hospital Stay (HOSPITAL_COMMUNITY): Payer: Medicare Other

## 2013-01-21 LAB — PROTIME-INR
INR: 3.2 — AB (ref 0.00–1.49)
Prothrombin Time: 31.6 seconds — ABNORMAL HIGH (ref 11.6–15.2)

## 2013-01-21 LAB — CBC
HCT: 32.1 % — ABNORMAL LOW (ref 36.0–46.0)
Hemoglobin: 10.3 g/dL — ABNORMAL LOW (ref 12.0–15.0)
MCH: 29.9 pg (ref 26.0–34.0)
MCHC: 32.1 g/dL (ref 30.0–36.0)
MCV: 93 fL (ref 78.0–100.0)
PLATELETS: 212 10*3/uL (ref 150–400)
RBC: 3.45 MIL/uL — ABNORMAL LOW (ref 3.87–5.11)
RDW: 14.5 % (ref 11.5–15.5)
WBC: 8.9 10*3/uL (ref 4.0–10.5)

## 2013-01-21 LAB — BASIC METABOLIC PANEL
BUN: 16 mg/dL (ref 6–23)
CHLORIDE: 107 meq/L (ref 96–112)
CO2: 20 mEq/L (ref 19–32)
CREATININE: 0.83 mg/dL (ref 0.50–1.10)
Calcium: 8.1 mg/dL — ABNORMAL LOW (ref 8.4–10.5)
GFR calc non Af Amer: 60 mL/min — ABNORMAL LOW (ref >90)
GFR, EST AFRICAN AMERICAN: 70 mL/min — AB (ref >90)
Glucose, Bld: 110 mg/dL — ABNORMAL HIGH (ref 70–99)
Potassium: 3.7 mEq/L (ref 3.7–5.3)
Sodium: 142 mEq/L (ref 137–147)

## 2013-01-21 LAB — EXPECTORATED SPUTUM ASSESSMENT W REFEX TO RESP CULTURE

## 2013-01-21 LAB — EXPECTORATED SPUTUM ASSESSMENT W GRAM STAIN, RFLX TO RESP C

## 2013-01-21 MED ORDER — ACETAMINOPHEN 325 MG PO TABS
650.0000 mg | ORAL_TABLET | ORAL | Status: DC | PRN
  Administered 2013-01-21 – 2013-01-25 (×3): 650 mg via ORAL
  Filled 2013-01-21 (×3): qty 2

## 2013-01-21 MED ORDER — METOPROLOL TARTRATE 1 MG/ML IV SOLN
5.0000 mg | Freq: Four times a day (QID) | INTRAVENOUS | Status: DC | PRN
  Administered 2013-01-21: 5 mg via INTRAVENOUS
  Filled 2013-01-21: qty 5

## 2013-01-21 MED ORDER — VANCOMYCIN HCL IN DEXTROSE 1-5 GM/200ML-% IV SOLN
1000.0000 mg | Freq: Once | INTRAVENOUS | Status: AC
  Administered 2013-01-21: 1000 mg via INTRAVENOUS
  Filled 2013-01-21: qty 200

## 2013-01-21 MED ORDER — LORAZEPAM 2 MG/ML IJ SOLN
0.2500 mg | Freq: Once | INTRAMUSCULAR | Status: AC
  Administered 2013-01-21: 22:00:00 0.25 mg via INTRAVENOUS
  Filled 2013-01-21: qty 1

## 2013-01-21 MED ORDER — VANCOMYCIN HCL 500 MG IV SOLR
500.0000 mg | Freq: Two times a day (BID) | INTRAVENOUS | Status: DC
  Administered 2013-01-22 – 2013-01-24 (×5): 500 mg via INTRAVENOUS
  Filled 2013-01-21 (×7): qty 500

## 2013-01-21 MED ORDER — AMIODARONE HCL 100 MG PO TABS
100.0000 mg | ORAL_TABLET | Freq: Every day | ORAL | Status: DC
  Administered 2013-01-21 – 2013-01-22 (×2): 100 mg via ORAL
  Filled 2013-01-21 (×3): qty 1

## 2013-01-21 MED ORDER — LEVOFLOXACIN IN D5W 750 MG/150ML IV SOLN
750.0000 mg | INTRAVENOUS | Status: DC
  Administered 2013-01-21 – 2013-01-23 (×2): 750 mg via INTRAVENOUS
  Filled 2013-01-21 (×3): qty 150

## 2013-01-21 NOTE — Progress Notes (Signed)
Occupational Therapy Treatment Patient Details Name: [name] MRN: [id number] DOB: [birth year] Today's Date: 01/21/2013 Time: [id number] OT Time Calculation (min): 14 min  OT Assessment / Plan / Recommendation  History of present illness Pt is a 80 year old female who presented to the emergency department this morning after developing abdominal pain nausea and vomiting overnight. Her family is present in the room. They report multiple episodes of emesis. She described pain as cramping and the feeling like there was a watermelon on her stomach. The pain started centrally and in the upper abdomen. There is no hematemesis. She had a bowel movement last evening but has not passed flatus since. She had a similar episode in 2011 which resolved without the need for surgery or even an NG tube. Currently, she is more comfortable since a nasogastric tube is in place. She is otherwise without complaints. She denies shortness of breath or chest pain. There have been no fevers or chills.   OT comments  Limited visit due to visitors. Primarily limited by vision.  Follow Up Recommendations  No OT follow up;Supervision - Intermittent    Barriers to Discharge       Equipment Recommendations  None recommended by OT    Recommendations for Other Services    Frequency Min 2X/week   Progress towards OT Goals Progress towards OT goals: Progressing toward goals  Plan Discharge plan remains appropriate    Precautions / Restrictions Precautions Precautions: Fall Precaution Comments: NG tube has been d/cd Restrictions Weight Bearing Restrictions: No   Pertinent Vitals/Pain No pain, VSS    ADL  Eating/Feeding: Independent Where Assessed - Eating/Feeding: Bed level Grooming: Wash/dry hands;Set up Where Assessed - Grooming: Unsupported standing Toilet Transfer: Supervision/safety Armed forces technical officer Method: Sit to Loss adjuster, chartered: Comfort height toilet;Grab bars Toileting - Marine scientist and Hygiene: Supervision/safety Where Assessed - Best boy and Hygiene: Standing Equipment Used: Rolling walker Transfers/Ambulation Related to ADLs: supervision with rollator, limited by vision ADL Comments: Pt stated she had already walked in the hallway today.  Limited session due to arrival of visitors.    OT Diagnosis:    OT Problem List:   OT Treatment Interventions:     OT Goals(current goals can now be found in the care plan section) Acute Rehab OT Goals Patient Stated Goal: to get back home. OT Goal Formulation: With patient/family Time For Goal Achievement: 01/26/13  Visit Information  Last OT Received On: 01/21/13 Assistance Needed: +1 History of Present Illness: Pt is a 80 year old female who presented to the emergency department this morning after developing abdominal pain nausea and vomiting overnight. Her family is present in the room. They report multiple episodes of emesis. She described pain as cramping and the feeling like there was a watermelon on her stomach. The pain started centrally and in the upper abdomen. There is no hematemesis. She had a bowel movement last evening but has not passed flatus since. She had a similar episode in 2011 which resolved without the need for surgery or even an NG tube. Currently, she is more comfortable since a nasogastric tube is in place. She is otherwise without complaints. She denies shortness of breath or chest pain. There have been no fevers or chills.    Subjective Data      Prior Functioning       Cognition  Cognition Arousal/Alertness: Awake/alert Behavior During Therapy: WFL for tasks assessed/performed Overall Cognitive Status: Within Functional Limits for tasks assessed    Mobility  Exercises      Balance    End of Session OT - End of Session Equipment Utilized During Treatment: Rolling walker Activity Tolerance: Patient tolerated treatment well Patient left: in  bed;with call bell/phone within reach;with family/visitor present  GO     Malka So 01/21/2013, 1:19 PM [phone]

## 2013-01-21 NOTE — Progress Notes (Signed)
Physical Therapy Treatment Patient Details Name: [name] MRN: [id number] DOB: 1920-02-27 Today's Date: 01/21/2013 Time: [id number] PT Time Calculation (min): 15 min  PT Assessment / Plan / Recommendation  History of Present Illness Pt is a 80 year old female who presented to the emergency department this morning after developing abdominal pain nausea and vomiting overnight. Her family is present in the room. They report multiple episodes of emesis. She described pain as cramping and the feeling like there was a watermelon on her stomach. The pain started centrally and in the upper abdomen. There is no hematemesis. She had a bowel movement last evening but has not passed flatus since. She had a similar episode in 2011 which resolved without the need for surgery or even an NG tube. Currently, she is more comfortable since a nasogastric tube is in place. She is otherwise without complaints. She denies shortness of breath or chest pain. There have been no fevers or chills.   PT Comments   Pt moving well; incr HR, RN aware; asymptomatic  Regarding incr HR;   Follow Up Recommendations  No PT follow up     Does the patient have the potential to tolerate intense rehabilitation     Barriers to Discharge        Equipment Recommendations  None recommended by PT    Recommendations for Other Services    Frequency Min 3X/week   Progress towards PT Goals Progress towards PT goals: Progressing toward goals  Plan Current plan remains appropriate    Precautions / Restrictions Precautions Precautions: Fall Precaution Comments: NG tube has been d/cd Restrictions Weight Bearing Restrictions: No   Pertinent Vitals/Pain C/o back pain, chronic in nature RN aware    Mobility  Bed Mobility Overal bed mobility: Needs Assistance Bed Mobility: Sit to Supine Sit to supine: Min guard General bed mobility comments: with LEs Transfers Overall transfer level: Needs assistance Equipment used: Rolling  walker (2 wheeled) Transfers: Sit to/from Stand (x2) Sit to Stand: Min guard General transfer comment: for anterior wt shift;  Ambulation/Gait Ambulation/Gait assistance: Supervision Ambulation Distance (Feet): 400 Feet Assistive device: 4-wheeled walker Gait Pattern/deviations: Step-through pattern General Gait Details: walks rapidly; HR monitored; HR 156 during gait,  120-124 after amb; had pt to rest during gait to incr HR; RN notified; NT to perform EKG    Exercises     PT Diagnosis:    PT Problem List:   PT Treatment Interventions:     PT Goals (current goals can now be found in the care plan section) Acute Rehab PT Goals Patient Stated Goal: to get back home. PT Goal Formulation: With patient Time For Goal Achievement: 02/01/13 Potential to Achieve Goals: Good  Visit Information  Last PT Received On: 01/21/13 Assistance Needed: +1 History of Present Illness: Pt is a 80 year old female who presented to the emergency department this morning after developing abdominal pain nausea and vomiting overnight. Her family is present in the room. They report multiple episodes of emesis. She described pain as cramping and the feeling like there was a watermelon on her stomach. The pain started centrally and in the upper abdomen. There is no hematemesis. She had a bowel movement last evening but has not passed flatus since. She had a similar episode in 2011 which resolved without the need for surgery or even an NG tube. Currently, she is more comfortable since a nasogastric tube is in place. She is otherwise without complaints. She denies shortness of breath or chest  pain. There have been no fevers or chills.    Subjective Data  Patient Stated Goal: to get back home.   Cognition  Cognition Arousal/Alertness: Awake/alert Behavior During Therapy: WFL for tasks assessed/performed Overall Cognitive Status: Within Functional Limits for tasks assessed    Balance     End of Session PT - End  of Session Equipment Utilized During Treatment: Gait belt Activity Tolerance: Patient tolerated treatment well Patient left: in bed;with call bell/phone within reach;with nursing/sitter in room;with family/visitor present Nurse Communication: Mobility status   GP     [facility] 01/21/2013, 4:16 PM

## 2013-01-21 NOTE — Progress Notes (Signed)
ANTIBIOTIC CONSULT NOTE - INITIAL  Pharmacy Consult for Vancomycin Indication: Aspiration PNA  Allergies  Allergen Reactions  . Cefdinir     Caused bad diarrhea  . Cortisone     Flushed hands and face   . Prednisone     cuased blood clots  . Sulfonamide Derivatives     Stomach pains and cramping     Patient Measurements: Height: 5\' 6"  (167.6 cm) Weight: 136 lb 14.5 oz (62.1 kg) IBW/kg (Calculated) : 59.3 Adjusted Body Weight:   Vital Signs: Temp: 99 F (37.2 C) (01/08 1459) Temp src: Oral (01/08 1459) BP: 134/67 mmHg (01/08 1459) Pulse Rate: 111 (01/08 1459) Intake/Output from previous day: 01/07 0701 - 01/08 0700 In: 1990 [I.V.:1270; NG/GT:20; IV Piggyback:700] Out: 250 [Urine:150; Emesis/NG output:100] Intake/Output from this shift: Total I/O In: 300 [P.O.:300] Out: -   Labs:  Recent Labs  01/19/13 0950 01/20/13 0604 01/21/13 0537  WBC 9.9 9.7 8.9  HGB 10.7* 10.9* 10.3*  PLT 186 199 212  CREATININE 0.97 0.93 0.83   Estimated Creatinine Clearance: 42.2 ml/min (by C-G formula based on Cr of 0.83). No results found for this basename: VANCOTROUGH, VANCOPEAK, VANCORANDOM, GENTTROUGH, GENTPEAK, GENTRANDOM, TOBRATROUGH, TOBRAPEAK, TOBRARND, AMIKACINPEAK, AMIKACINTROU, AMIKACIN,  in the last 72 hours   Microbiology: No results found for this or any previous visit (from the past 720 hour(s)).  Medical History: Past Medical History  Diagnosis Date  . Hypothyroidism   . Colitis   . Blood clot associated with vein wall inflammation   . Pulmonary embolus     and DVT s/p IVC filter  . Paroxysmal atrial fibrillation   . Blindness   . SBO (small bowel obstruction)   . Temporal arteritis   . Goiter     s/p radioactive iod. tx  . Spinal stenosis of lumbar region with radiculopathy     possible neurogenic claudication recently?  Marland Kitchen PAF (paroxysmal atrial fibrillation)   . CKD (chronic kidney disease), stage IV     Assessment: 47 yoF with PMHx PAF, DVT, PE  on AC and s/p IVCF, microscopic colitis, and CDiff admitted [facility] on 1/4 with abdominal pain and nausea, found to have SBO and PNA.  Pt has been treated for aspiration PNA and possible gastroenteritis with regimen to cover CDiff empirically initially, now changed to levaquin (per MD) and vancomycin per pharmacy  D#5 Antibiotics 1/4 >> Azithromycin >> 1/7 1/4 >> Metronidazole >> 1/8 1/5 >> Penicillin G >> 1/8 1/8 >> Levaquin >> 1/8 >> Vancomycin >>  Tm24h: 99.1 Renal: CKD-IV. SCr 0.83, CrCl 42 (N50) WBC WNL  Microbiology:  Sputum culture  Goal of Therapy:  Vancomycin trough level 15-20 mcg/ml Eradication of infection  Plan:  1. Vancomycin 1000mg  IV x 1 now, then 500mg  IV q 12 hours 2. Continue levaquin 750mg  IV q 48 hours 3.  F/u renal function, clinical course  Avacyn Kloosterman E 01/21/2013,4:38 PM

## 2013-01-21 NOTE — Progress Notes (Signed)
  Subjective: No problems with NG clamped Passing flatus and having BM's  Objective: Vital signs in last 24 hours: Temp:  [98 F (36.7 C)-99.1 F (37.3 C)] 99.1 F (37.3 C) 2022/02/20 0600) Pulse Rate:  [64-75] 70 02-20-2022 0600) Resp:  [18-20] 20 20-Feb-2022 0600) BP: (148-169)/(51-70) 152/70 mmHg 02-20-2022 0600) SpO2:  [91 %-97 %] 97 % 02-20-22 0600) Last BM Date: 01/20/13  Intake/Output from previous day: 01/07 0701 - Feb 20, 2022 0700 In: 1990 [I.V.:1270; NG/GT:20; IV Piggyback:700] Out: 250 [Urine:150; Emesis/NG output:100] Intake/Output this shift:    Looks good Abdomen soft and non-tender  Lab Results:   Recent Labs  01/20/13 0604 02-20-2013 0537  WBC 9.7 8.9  HGB 10.9* 10.3*  HCT 34.5* 32.1*  PLT 199 212   BMET  Recent Labs  01/20/13 0604 02-20-2013 0537  NA 142 142  K 3.5* 3.7  CL 107 107  CO2 19 20  GLUCOSE 84 110*  BUN 19 16  CREATININE 0.93 0.83  CALCIUM 8.3* 8.1*   PT/INR  Recent Labs  01/20/13 0604 20-Feb-2013 0537  LABPROT 25.0* 31.6*  INR 2.36* 3.20*   ABG No results found for this basename: PHART, PCO2, PO2, HCO3,  in the last 72 hours  Studies/Results: Dg Abd 2 Views  2013/02/20   CLINICAL DATA:  Abdominal distention, small bowel obstruction  EXAM: ABDOMEN - 2 VIEW  COMPARISON:  01/18/2013  FINDINGS: Tip of nasogastric tube projects over gastric antrum.  IVC filter present.  Few mildly distended loops of small bowel are again seen in the abdomen.  Small amount of contrast and gas within colon.  No definite bowel wall thickening or free intraperitoneal air.  Osseous demineralization with degenerative changes and scoliosis of the lumbar spine.  IMPRESSION: Few persistent mildly dilated small bowel loops.   Electronically Signed   By: Lavonia Dana M.D.   On: 20-Feb-2013 08:13    Anti-infectives: Anti-infectives   Start     Dose/Rate Route Frequency Ordered Stop   01/20/13 1800  azithromycin (ZITHROMAX) 500 mg in dextrose 5 % 250 mL IVPB     500 mg 250 mL/hr  over 60 Minutes Intravenous Every 24 hours 01/20/13 1733     01/17/13 1800  penicillin G potassium 1.5 Million Units in dextrose 5 % 50 mL IVPB     1.5 Million Units 100 mL/hr over 30 Minutes Intravenous 4 times per day 01/17/13 1704     01/17/13 1230  penicillin G potassium 1.5 Million Units in dextrose 5 % 50 mL IVPB  Status:  Discontinued     1.5 Million Units 100 mL/hr over 30 Minutes Intravenous 4 times per day 01/17/13 1157 01/17/13 1704   01/17/13 1200  metroNIDAZOLE (FLAGYL) IVPB 500 mg     500 mg 100 mL/hr over 60 Minutes Intravenous Every 8 hours 01/17/13 1157     01/17/13 1200  azithromycin (ZITHROMAX) 500 mg in dextrose 5 % 250 mL IVPB     500 mg 250 mL/hr over 60 Minutes Intravenous Every 24 hours 01/17/13 1157 01/19/13 1616      Assessment/Plan: s/p * No surgery found *  Resolving SBO  Will D/C NG and start clear liquids  LOS: 4 days    [name] A 02/20/2013

## 2013-01-21 NOTE — Progress Notes (Signed)
[name] 9:52 AM  Subjective: Patient doing much better although some shortness of breath last night and agree with surgical note Objective: Vital signs stable afebrile no acute distress abdomen is soft nontender minimal distention x-ray improved labs stable  Assessment: Improved  Plan: Agree with surgical plans we'll resume 5-ASA lialda in a few days when she is eating and we discussed case with patient and family [name]

## 2013-01-21 NOTE — Progress Notes (Signed)
ANTICOAGULATION CONSULT NOTE - Follow Up Consult  Pharmacy Consult for:  Lovenox Indication:  Paroxysmal atrial fibrillation, history of DVT and PE, Coumadin on hold due to small bowel obstruction  Allergies  Allergen Reactions  . Cefdinir     Caused bad diarrhea  . Cortisone     Flushed hands and face   . Prednisone     cuased blood clots  . Sulfonamide Derivatives     Stomach pains and cramping     Patient Measurements: Height: 5\' 6"  (167.6 cm) Weight: 136 lb 14.5 oz (62.1 kg) IBW/kg (Calculated) : 59.3  Vital Signs: Temp: 99.1 F (37.3 C) (01/08 0600) Temp src: Oral (01/08 0600) BP: 152/70 mmHg (01/08 0600) Pulse Rate: 70 (01/08 0600)  Labs:  Recent Labs  01/19/13 0950 01/20/13 0604 01/21/13 0537  HGB 10.7* 10.9* 10.3*  HCT 32.8* 34.5* 32.1*  PLT 186 199 212  LABPROT 22.4* 25.0* 31.6*  INR 2.04* 2.36* 3.20*  CREATININE 0.97 0.93 0.83    Estimated Creatinine Clearance: 42.2 ml/min (by C-G formula based on Cr of 0.83).   Medical History: Past Medical History  Diagnosis Date  . Hypothyroidism   . Colitis   . Blood clot associated with vein wall inflammation   . Pulmonary embolus     and DVT s/p IVC filter  . Paroxysmal atrial fibrillation   . Blindness   . SBO (small bowel obstruction)   . Temporal arteritis   . Goiter     s/p radioactive iod. tx  . Spinal stenosis of lumbar region with radiculopathy     possible neurogenic claudication recently?  Marland Kitchen PAF (paroxysmal atrial fibrillation)   . CKD (chronic kidney disease), stage IV     Medications:  Scheduled:  . antiseptic oral rinse  15 mL Mouth Rinse q12n4p  . azithromycin  500 mg Intravenous Q24H  . chlorhexidine  15 mL Mouth Rinse BID  . levothyroxine  75 mcg Intravenous Daily  . LORazepam  0.25 mg Intravenous Once  . metronidazole  500 mg Intravenous Q8H  . pantoprazole (PROTONIX) IV  40 mg Intravenous Q12H  . pencillin G potassium IV  1.5 Million Units Intravenous Q6H  . sodium  chloride  3 mL Intravenous Q12H    Assessment:  Asked to assist with Lovenox therapy for this 81 year-old female with small bowel obstruction.   Mrs. Oman has PAF and a history of DVT, PE, and IVC filter placement.  Prior to admission she received chronic anticoagulation with Coumadin;  the usual home dose is documented as 2.5 mg daily except for 1.25 mg on M-W-F.  Coumadin is currently on hold along with other oral medications.  INR supratherapeutic today (3.2) despite withholding Coumadin since admission.  Goals of Therapy:   Anti-Xa level 0.6-1 units/ml 4hrs after LMWH dose given  Monitor platelets by anticoagulation protocol: Yes  Prevention of thromboembolism   Plan:   Monitor PT/INR daily  Begin Lovenox when INR < 2   Clayburn Pert, PharmD, BCPS Pager: [phone] 01/21/2013  10:33 AM

## 2013-01-21 NOTE — Progress Notes (Signed)
Patient ID: [name], female   DOB: [birth year], 80 y.o.   MRN: [id number]  [facility] PROGRESS NOTE  [name] [id number] DOB: 05-10-1920 DOA: 01/17/2013 PCP: [name], MD  Brief narrative: 80 y.o. year-old female with history of PAF, DVT and PE on A/C and s/p IVC filter, microscopic colitis and hx of self-resolved SBO, temporal arteritis no longer on prednisone, hypothyroidism, blindness, spinal stenosis who presented to [facility] ED with sharp and constant LUQ abdominal pain that started several days PTA, associated with non bloody diarrhea and bilious vomiting.   In the ED, WBC 13.1, BUN 45 Cr 1.36, INR 2.64. CT scan of the abdomen and pelvis demonstrated a distal SBO with no perforation or abscess. Incidentally, right middle and left lower lobe airspace disease was noted and suspicious for PNA. General surgery has been consultative.   Principal Problem:  SBO (small bowel obstruction)  - pt reports feeling better this AM, NGT is out and pt tolerating clear liquids  - appreciate surgery and GI assistance  - will discontinue IVF today as pt is more congested on exam with crackles on exam Active Problems:  Atrial fibrillation  - rate controlled but several episodes of HR in 110 - 120 - will restart amiodarone and will also place on Metoprolol IV as needed for HR > 110  Acute respiratory failure - over the past 24 hours one episode of hypoxia O2 sat in 80's - CXR with worsening multilobar PNA - will d/c IV ABX started on admission (Flagyl, PCN G, Zithro) and will place on Levaquin and Vancomycin - obtain sputum analysis, culture and gram stain   Pulmonary embolus  - will resume Coumadin as per pharmacy - INR is 3.2 this AM Nausea and vomiting  - secondary to SBO, opt clinically improving and tolerating clear liquid diet   - continue to provide supportive care with analgesia as needed  Leukocytosis  - secondary to PNA and SBO  - WBC trending down and WNL this AM  CKD  (chronic kidney disease), stage IV  - remains stable and at baseline, WNL this AM  - BMP in AM  Hypokalemia  - secondary to vomiting and diarrhea  - supplemented and within normal limits this AM - BMP in AM  Consultants:  GI  Surgery  Procedures/Studies:  Dg Abd Portable 1v 01/18/2013 Nasogastric tube tip in proximal stomach with side port at the gastroesophageal junction. Advise advancing nasogastric tube 6 to 8 cm. Less bowel dilatation compared to earlier in the day. Contrast is seen in the cecum. No free air seen on this supine examination.  Dg Chest 2 View   01/21/2013  Increased bibasilar infiltrates consistent with pneumonia.  Underlying COPD and bibasilar effusions.   Dg Abd 2 Views  01/21/2013   Few persistent mildly dilated small bowel loops.    Antibiotics:  Flagyl 01/04 --> 01/07 Zithromax 01/04 --> 01/07 Levaquin 01/07 --> Vancomycin 01/07 -->  Code Status: DNR  Family Communication: Pt and daughter at bedside  Disposition Plan: Home when medically stable   HPI/Subjective: No events overnight.   Objective: Filed Vitals:   01/20/13 2246 01/20/13 2330 01/21/13 0600 01/21/13 1459  BP: 163/61 169/69 152/70 134/67  Pulse: 75 74 70 111  Temp: 98.6 F (37 C) 98.3 F (36.8 C) 99.1 F (37.3 C) 99 F (37.2 C)  TempSrc: Oral Oral Oral Oral  Resp: 20 20 20 16   Height:      Weight:      SpO2:  91% 92% 97% 98%    Intake/Output Summary (Last 24 hours) at 01/21/13 1638 Last data filed at 01/21/13 1500  Gross per 24 hour  Intake    300 ml  Output      0 ml  Net    300 ml    Exam:   General:  Pt is alert, follows commands appropriately, not in acute distress  Cardiovascular: Regular rhythm, tachycardic, S1/S2, no murmurs, no rubs, no gallops  Respiratory: Crackles at bases with rhonchi, upper lobes clear   Abdomen: Soft, non tender, non distended, bowel sounds present, no guarding  Extremities: No edema, pulses DP and PT palpable bilaterally  Neuro: Grossly  nonfocal  Data Reviewed: Basic Metabolic Panel:  Recent Labs Lab 01/18/13 0527 01/18/13 2230 01/19/13 0950 01/20/13 0604 01/21/13 0537  NA 141 141 142 142 142  K 4.1 3.7 3.7 3.5* 3.7  CL 106 106 105 107 107  CO2 26 25 24 19 20   GLUCOSE 119* 103* 88 84 110*  BUN 33* 26* 22 19 16   CREATININE 1.27* 1.10 0.97 0.93 0.83  CALCIUM 8.3* 8.2* 8.4 8.3* 8.1*   Liver Function Tests:  Recent Labs Lab 01/17/13 0632 01/18/13 2230  AST 30 29  ALT 23 31  ALKPHOS 100 66  BILITOT 0.5 0.7  PROT 7.5 5.7*  ALBUMIN 4.2 3.0*    Recent Labs Lab 01/17/13 0632  LIPASE 36   CBC:  Recent Labs Lab 01/17/13 0632 01/18/13 0527 01/18/13 2230 01/19/13 0950 01/20/13 0604 01/21/13 0537  WBC 13.1* 10.7* 10.6* 9.9 9.7 8.9  NEUTROABS 11.5*  --   --   --   --   --   HGB 13.0 10.7* 10.9* 10.7* 10.9* 10.3*  HCT 39.3 33.5* 34.5* 32.8* 34.5* 32.1*  MCV 93.1 93.6 94.5 94.3 94.5 93.0  PLT 229 201 180 186 199 212   Cardiac Enzymes:  Recent Labs Lab 01/17/13 1247 01/17/13 1834 01/18/13 0054  TROPONINI <0.30 <0.30 <0.30   Scheduled Meds: . amiodarone  100 mg Oral Daily  . chlorhexidine  15 mL Mouth Rinse BID  . Levofloxacin IV  750 mg Intravenous Q48H  . levothyroxine  75 mcg Intravenous Daily  . LORazepam  0.25 mg Intravenous Once  . metronidazole  500 mg Intravenous Q8H  . pantoprazole  IV  40 mg Intravenous Q12H   Continuous Infusions:   Faye Ramsay, MD  [facility] Pager [phone]  If 7PM-7AM, please contact night-coverage [email or web address] Password [email or web address] 01/21/2013, 4:38 PM   LOS: 4 days

## 2013-01-21 NOTE — Progress Notes (Signed)
Seen and agree  

## 2013-01-22 ENCOUNTER — Telehealth: Payer: Self-pay | Admitting: Interventional Cardiology

## 2013-01-22 LAB — BASIC METABOLIC PANEL
BUN: 14 mg/dL (ref 6–23)
CALCIUM: 8.2 mg/dL — AB (ref 8.4–10.5)
CO2: 21 meq/L (ref 19–32)
CREATININE: 0.81 mg/dL (ref 0.50–1.10)
Chloride: 103 mEq/L (ref 96–112)
GFR calc Af Amer: 72 mL/min — ABNORMAL LOW (ref >90)
GFR calc non Af Amer: 62 mL/min — ABNORMAL LOW (ref >90)
Glucose, Bld: 114 mg/dL — ABNORMAL HIGH (ref 70–99)
Potassium: 3.9 mEq/L (ref 3.7–5.3)
Sodium: 138 mEq/L (ref 137–147)

## 2013-01-22 LAB — PROTIME-INR
INR: 3.76 — ABNORMAL HIGH (ref 0.00–1.49)
Prothrombin Time: 35.7 seconds — ABNORMAL HIGH (ref 11.6–15.2)

## 2013-01-22 LAB — PRO B NATRIURETIC PEPTIDE: PRO B NATRI PEPTIDE: 10497 pg/mL — AB (ref 0–450)

## 2013-01-22 LAB — CBC
HCT: 33.1 % — ABNORMAL LOW (ref 36.0–46.0)
Hemoglobin: 11.2 g/dL — ABNORMAL LOW (ref 12.0–15.0)
MCH: 30.3 pg (ref 26.0–34.0)
MCHC: 33.8 g/dL (ref 30.0–36.0)
MCV: 89.5 fL (ref 78.0–100.0)
PLATELETS: 240 10*3/uL (ref 150–400)
RBC: 3.7 MIL/uL — ABNORMAL LOW (ref 3.87–5.11)
RDW: 14.4 % (ref 11.5–15.5)
WBC: 10.1 10*3/uL (ref 4.0–10.5)

## 2013-01-22 LAB — EXPECTORATED SPUTUM ASSESSMENT W GRAM STAIN, RFLX TO RESP C: Special Requests: NORMAL

## 2013-01-22 MED ORDER — FUROSEMIDE 20 MG PO TABS
20.0000 mg | ORAL_TABLET | Freq: Once | ORAL | Status: AC
  Administered 2013-01-22: 20 mg via ORAL
  Filled 2013-01-22: qty 1

## 2013-01-22 MED ORDER — BENZONATATE 100 MG PO CAPS
100.0000 mg | ORAL_CAPSULE | Freq: Three times a day (TID) | ORAL | Status: DC | PRN
  Filled 2013-01-22: qty 1

## 2013-01-22 MED ORDER — AMIODARONE HCL 100 MG PO TABS
100.0000 mg | ORAL_TABLET | Freq: Two times a day (BID) | ORAL | Status: DC
  Administered 2013-01-22 – 2013-01-27 (×10): 100 mg via ORAL
  Filled 2013-01-22 (×11): qty 1

## 2013-01-22 MED ORDER — METOPROLOL TARTRATE 1 MG/ML IV SOLN
5.0000 mg | Freq: Once | INTRAVENOUS | Status: AC
  Administered 2013-01-22: 5 mg via INTRAVENOUS
  Filled 2013-01-22: qty 5

## 2013-01-22 MED ORDER — MESALAMINE 1.2 G PO TBEC
4.8000 g | DELAYED_RELEASE_TABLET | Freq: Every day | ORAL | Status: DC
  Administered 2013-01-23 – 2013-01-27 (×5): 4.8 g via ORAL
  Filled 2013-01-22 (×7): qty 4

## 2013-01-22 NOTE — Progress Notes (Signed)
Pt arrived from 6th floor alert and oriented, with her daughter at her side.  O2 at 2 L/min via  with sats 97%; dropped O2 to 1 l/min. Will monitor sats.  Pt now in room 1434. Report given to Probation officer by 6th floor nurse, "Manuela Schwartz".

## 2013-01-22 NOTE — Progress Notes (Signed)
Subjective: She is SOB, feels tremors in her chest, with AF HR up to the 140's, so she may feel it.   Tolerating clears, and having BM's.  Not distended. Objective: Vital signs in last 24 hours: Temp:  [98.4 F (36.9 C)-99 F (37.2 C)] 98.4 F (36.9 C) (01/09 0500) Pulse Rate:  [80-121] 111 (01/09 0500) Resp:  [16-20] 18 (01/09 0500) BP: (122-161)/(60-99) 124/60 mmHg (01/09 0500) SpO2:  [91 %-98 %] 97 % (01/09 0500) Last BM Date: 01/21/13 360 PO recorded yesterday 5 BM's recorded Afebrile, somewhat tachycardic INR 3.76, Other labs OK Intake/Output from previous day: 01/08 0701 - 01/09 0700 In: 760 [P.O.:360; I.V.:400] Out: 475 [Urine:475] Intake/Output this shift:    General appearance: alert, cooperative, no distress and says she feels SOB Resp: BS down some in bases with rale, left more than right this AM Cardio: irregular tachycardic ABD:  She is up in chair, she is not distended and abdomen is soft. Lab Results:   Recent Labs  01/21/13 0537 01/22/13 0504  WBC 8.9 10.1  HGB 10.3* 11.2*  HCT 32.1* 33.1*  PLT 212 240    BMET  Recent Labs  01/21/13 0537 01/22/13 0504  NA 142 138  K 3.7 3.9  CL 107 103  CO2 20 21  GLUCOSE 110* 114*  BUN 16 14  CREATININE 0.83 0.81  CALCIUM 8.1* 8.2*   PT/INR  Recent Labs  01/21/13 0537 01/22/13 0504  LABPROT 31.6* 35.7*  INR 3.20* 3.76*     Recent Labs Lab 01/17/13 0632 01/18/13 2230  AST 30 29  ALT 23 31  ALKPHOS 100 66  BILITOT 0.5 0.7  PROT 7.5 5.7*  ALBUMIN 4.2 3.0*     Lipase     Component Value Date/Time   LIPASE 36 01/17/2013 [id number]     Studies/Results: Dg Chest 2 View  01/21/2013   CLINICAL DATA:  Decreased oxygen saturation  EXAM: CHEST  2 VIEW  COMPARISON:  01/17/2013  FINDINGS: Minimal enlargement of cardiac silhouette.  Pulmonary vascular congestion.  Bibasilar opacities consistent with pneumonia increased since previous exam.  Bibasilar pleural effusions dependently.  Underlying COPD.   No pneumothorax.  Bones demineralized.  IMPRESSION: Increased bibasilar infiltrates consistent with pneumonia.  Underlying COPD and bibasilar effusions.   Electronically Signed   By: Lavonia Dana M.D.   On: 01/21/2013 10:57   Dg Abd 2 Views  01/21/2013   CLINICAL DATA:  Abdominal distention, small bowel obstruction  EXAM: ABDOMEN - 2 VIEW  COMPARISON:  01/18/2013  FINDINGS: Tip of nasogastric tube projects over gastric antrum.  IVC filter present.  Few mildly distended loops of small bowel are again seen in the abdomen.  Small amount of contrast and gas within colon.  No definite bowel wall thickening or free intraperitoneal air.  Osseous demineralization with degenerative changes and scoliosis of the lumbar spine.  IMPRESSION: Few persistent mildly dilated small bowel loops.   Electronically Signed   By: Lavonia Dana M.D.   On: 01/21/2013 08:13    Medications: . amiodarone  100 mg Oral Daily  . antiseptic oral rinse  15 mL Mouth Rinse q12n4p  . chlorhexidine  15 mL Mouth Rinse BID  . levofloxacin (LEVAQUIN) IV  750 mg Intravenous Q48H  . levothyroxine  75 mcg Intravenous Daily  . [START ON 01/23/2013] mesalamine  4.8 g Oral Q breakfast  . pantoprazole (PROTONIX) IV  40 mg Intravenous Q12H  . sodium chloride  3 mL Intravenous Q12H  . vancomycin  500  mg Intravenous Q12H    Assessment/Plan SBO (small bowel obstruction)  Hx microscopic colitis well-managed on 5-ASA  hypothyroid  Atrial fibrillation  Pulmonary embolus  Nausea and vomiting  Microscopic colitis  Leukocytosis  CKD (chronic kidney disease), stage IV  Hypokalemia  Plan:  I will advance her to a soft diet, and you can advance her as she tolerates PO's.  We will be glad to come back if she has further problems.     LOS: 5 days    Calistro Rauf 01/22/2013

## 2013-01-22 NOTE — Consult Note (Signed)
CARDIOLOGY CONSULT NOTE   Patient ID: [name] MRN: [id number] DOB/AGE: May 03, 1920 80 y.o.  Admit date: 01/17/2013  Primary Physician   [name], MD Primary Cardiologist   Dr. Lovena Le Dr. [name] Reason for Consultation   Afib/Flutter  HPI: [name] is a 80 y.o. female with a history of PAF on amiodarone and coumadin, DVT and PE on A/C and s/p IVC filter, temporal arteritis no longer on prednisone, hypothyroidism, blindness and spinal stenosis who was admitted on 01/17/13 with SBO as well as incidental finding of multilobar PNA. Her amioderone was held on admission and now back in atrial fibrillation. She was treated for aspiration/CAP PNA with faiulre and now on Levaquin and Vancomycin. NG tube was placed and SBO now resolved. She has kept down some broth and had a BM. She had some hypokalmeia from vomiting and diarrhea which was repleted. Her amioderone was held on admission and now back in atrial fibrillation. She was given one dose of amiodarone and IV Lopressor for tachycardia >110. She feels weak, with mild SOB and an uneasy feeling across her precordium      INR is 3.2 this AM, K 3.9, Creat .81, WBC 10.1,     Past Medical History  Diagnosis Date  . Hypothyroidism   . Colitis   . Blood clot associated with vein wall inflammation   . Pulmonary embolus     and DVT s/p IVC filter  . Paroxysmal atrial fibrillation   . Blindness   . SBO (small bowel obstruction)   . Temporal arteritis   . Goiter     s/p radioactive iod. tx  . Spinal stenosis of lumbar region with radiculopathy     possible neurogenic claudication recently?  Marland Kitchen PAF (paroxysmal atrial fibrillation)   . CKD (chronic kidney disease), stage IV      Past Surgical History  Procedure Laterality Date  . Back surgery  04/1998    spinal stenosis  . Back surgery  2000    Ruptured Disc  . Total knee arthroplasty Left 04/1999  . Total knee arthroplasty Right 02/2000  . Carpal tunnel release  2002  .  Appendectomy  1940  . Partial hysterectomy    . Tonsillectomy  1934  . Breast biopsy      cysts in both breasts  . Ivc filter      Allergies  Allergen Reactions  . Cefdinir     Caused bad diarrhea  . Cortisone     Flushed hands and face   . Prednisone     cuased blood clots  . Sulfonamide Derivatives     Stomach pains and cramping     I have reviewed the patient's current medications . amiodarone  100 mg Oral Daily  . antiseptic oral rinse  15 mL Mouth Rinse q12n4p  . chlorhexidine  15 mL Mouth Rinse BID  . levofloxacin (LEVAQUIN) IV  750 mg Intravenous Q48H  . levothyroxine  75 mcg Intravenous Daily  . [START ON 01/23/2013] mesalamine  4.8 g Oral Q breakfast  . pantoprazole (PROTONIX) IV  40 mg Intravenous Q12H  . sodium chloride  3 mL Intravenous Q12H  . vancomycin  500 mg Intravenous Q12H     acetaminophen, HYDROmorphone (DILAUDID) injection, menthol-cetylpyridinium, metoprolol, ondansetron (ZOFRAN) IV, ondansetron, promethazine, white petrolatum  Prior to Admission medications   Medication Sig Start Date End Date Taking? Authorizing Provider  amiodarone (PACERONE) 200 MG tablet TAKE (1/2) TABLET DAILY. 11/16/12  Yes Lynnell Dike  Blenda Bridegroom, MD  Calcium Carb-Cholecalciferol (CALCIUM 600 + D) 600-200 MG-UNIT TABS Take 1 tablet by mouth 3 (three) times daily.   Yes Historical Provider, MD  levothyroxine (SYNTHROID, LEVOTHROID) 150 MCG tablet Take 150 mcg by mouth daily before breakfast.   Yes Historical Provider, MD  mesalamine (LIALDA) 1.2 G EC tablet Take 4.8 g by mouth daily with breakfast.    Yes Historical Provider, MD  Misc Natural Products (GLUCOSAMINE CHONDROITIN COMPLX) TABS Take 1 tablet by mouth 2 (two) times daily as needed (joint pain relief).   Yes Historical Provider, MD  Multiple Vitamins-Minerals (OCUVITE ADULT 50+ PO) Take 1 tablet by mouth daily.   Yes Historical Provider, MD  warfarin (COUMADIN) 2.5 MG tablet Take 2.5 mg by mouth See admin instructions. On  Mon, Wed, Fri take 1/2 tab (1.25mg ) and Tues, Thurs, Sat, Sun take 1 tab (2.5mg ).   Yes Historical Provider, MD     History   Social History  . Marital Status: Widowed    Spouse Name: N/A    Number of Children: 4  . Years of Education: College   Occupational History  .     Social History Main Topics  . Smoking status: Never Smoker   . Smokeless tobacco: Never Used  . Alcohol Use: No  . Drug Use: No  . Sexual Activity: No   Other Topics Concern  . Not on file   Social History Narrative   Patient lives at home alone.  Use walker to get around   Caffeine Use: rarely    No family status information on file.   Family History  Problem Relation Age of Onset  . Breast cancer Sister   . Crohn's disease Neg Hx   . Ulcerative colitis Neg Hx      ROS:  Full 14 point review of systems complete and found to be negative unless listed above.  Physical Exam: Blood pressure 124/60, pulse 111, temperature 98.4 F (36.9 C), temperature source Oral, resp. rate 18, height 5\' 6"  (1.676 m), weight 136 lb 14.5 oz (62.1 kg), SpO2 97.00%.  General: Pt is alert, follows commands appropriately, not in acute distress  Cardiovascular: Irregular rate and rhythm, S1/S2, no murmurs, no rubs, no gallops  Respiratory: Some mild rhonchi and wheezes Abdomen: Soft, non tender, non distended, bowel sounds present, no guarding,  Extremities: No edema, pulses DP and PT palpable bilaterally  Neuro: Grossly nonfocal  Labs:   Lab Results  Component Value Date   WBC 10.1 01/22/2013   HGB 11.2* 01/22/2013   HCT 33.1* 01/22/2013   MCV 89.5 01/22/2013   PLT 240 01/22/2013    Recent Labs  01/22/13 0504  INR 3.76*    Recent Labs Lab 01/18/13 2230  01/22/13 0504  NA 141  < > 138  K 3.7  < > 3.9  CL 106  < > 103  CO2 25  < > 21  BUN 26*  < > 14  CREATININE 1.10  < > 0.81  CALCIUM 8.2*  < > 8.2*  PROT 5.7*  --   --   BILITOT 0.7  --   --   ALKPHOS 66  --   --   ALT 31  --   --   AST 29  --   --     GLUCOSE 103*  < > 114*  ALBUMIN 3.0*  --   --   < > = values in this interval not displayed.  Echo:  NONE ECG:  01/21/13  atrial flutter with RVR HR 119  Radiology:  Dg Chest 2 View  01/21/2013   CLINICAL DATA:  Decreased oxygen saturation  EXAM: CHEST  2 VIEW  COMPARISON:  01/17/2013  FINDINGS: Minimal enlargement of cardiac silhouette.  Pulmonary vascular congestion.  Bibasilar opacities consistent with pneumonia increased since previous exam.  Bibasilar pleural effusions dependently.  Underlying COPD.  No pneumothorax.  Bones demineralized.  IMPRESSION: Increased bibasilar infiltrates consistent with pneumonia.  Underlying COPD and bibasilar effusions.   Electronically Signed   By: Lavonia Dana M.D.   On: 01/21/2013 10:57   Dg Abd 2 Views  01/21/2013   CLINICAL DATA:  Abdominal distention, small bowel obstruction  EXAM: ABDOMEN - 2 VIEW  COMPARISON:  01/18/2013  FINDINGS: Tip of nasogastric tube projects over gastric antrum.  IVC filter present.  Few mildly distended loops of small bowel are again seen in the abdomen.  Small amount of contrast and gas within colon.  No definite bowel wall thickening or free intraperitoneal air.  Osseous demineralization with degenerative changes and scoliosis of the lumbar spine.  IMPRESSION: Few persistent mildly dilated small bowel loops.   Electronically Signed   By: Lavonia Dana M.D.   On: 01/21/2013 08:13    ASSESSMENT AND PLAN:    Principal Problem:   SBO (small bowel obstruction) Active Problems:   Unspecified disorder of thyroid   Atrial fibrillation   Pulmonary embolus   Nausea and vomiting   Microscopic colitis   Leukocytosis   CKD (chronic kidney disease), stage IV   Aspiration pneumonia  [name] is a 80 y.o. female with a history of PAF on amiodarone and coumadin, DVT and PE on A/C and s/p IVC filter, temporal arteritis no longer on prednisone, hypothyroidism, blindness and spinal stenosis who was admitted on 01/17/13 with SBO as well as  incidental finding of multilobar PNA. Her amioderone was held on admission and now back in atrial fibrillation.  Atrial fibrillation  - Rate controlled but several episodes of HR in 110 - 120  - Amiodarone restarted with Metoprolol IV as needed for HR > 110. Given two 100 mg dose Amio. (200 home dose) Metoprolol given once -- INR supratheraputic (3.76)  Acute respiratory failure  - over the past 24 hours one episode of hypoxia O2 sat in 80's  - CXR with worsening multilobar PNA - will d/c IV ABX started on admission (Flagyl, PCN G, Zithro) and will place on Levaquin and Vancomycin  - obtain sputum analysis, culture and gram stain   Nausea and vomiting  -- Secondary to SBO, opt clinically improving and tolerating clear liquid diet  -- Supportive care   Leukocytosis  -- Resolved.   CKD (chronic kidney disease), stage IV  - remains stable and at baseline, WNL this AM  - Continue to monitor  SBO- resolved  Signed: Perry Mount, PA-C 01/22/2013 12:08 PM  Pager [phone]  Co-Sign MD Agree with assessment and plan as noted above.  I reviewed her chest x-ray.  Chest x-ray is suggestive of CHF as well as bibasilar pneumonia.  I will check a proBNP.  Renal function is normal.  I suspect that her atrial fibrillation was triggered by her severe medical illnesses of small bowel obstruction and pneumonia.  I suspect that she may well have gone back into atrial fibrillation even if she had not missed several days of amiodarone because of her small bowel obstruction.  Amiodarone has been restarted and we will increase her dose temporarily to 100  mg twice a day for rate control.  We will also update her echo evaluate LV systolic function.  Will gently diurese.  We'll use metoprolol to help reduce her rapid response to atrial flutter fibrillation.  Would not cardiovert at this time.  There is a good chance that she will convert back to sinus rhythm on her own as an outpatient as she recovers from her  recent severe illnesses.  She remains on long-term warfarin being managed by pharmacy.  Will follow with you.

## 2013-01-22 NOTE — Progress Notes (Addendum)
ANTICOAGULATION CONSULT NOTE - Follow Up Consult  Pharmacy Consult for:  Warfarin Indication:  Paroxysmal atrial fibrillation, history of DVT and PE  Allergies  Allergen Reactions  . Cefdinir     Caused bad diarrhea  . Cortisone     Flushed hands and face   . Prednisone     cuased blood clots  . Sulfonamide Derivatives     Stomach pains and cramping     Patient Measurements: Height: 5\' 6"  (167.6 cm) Weight: 136 lb 14.5 oz (62.1 kg) IBW/kg (Calculated) : 59.3  Vital Signs: Temp: 98.4 F (36.9 C) (01/09 0500) Temp src: Oral (01/09 0500) BP: 124/60 mmHg (01/09 0500) Pulse Rate: 111 (01/09 0500)  Labs:  Recent Labs  01/20/13 0604 01/21/13 0537 01/22/13 0504  HGB 10.9* 10.3* 11.2*  HCT 34.5* 32.1* 33.1*  PLT 199 212 240  LABPROT 25.0* 31.6* 35.7*  INR 2.36* 3.20* 3.76*  CREATININE 0.93 0.83 0.81    Estimated Creatinine Clearance: 43.2 ml/min (by C-G formula based on Cr of 0.81).   Medications:  Scheduled:  . amiodarone  100 mg Oral Daily  . antiseptic oral rinse  15 mL Mouth Rinse q12n4p  . chlorhexidine  15 mL Mouth Rinse BID  . levofloxacin (LEVAQUIN) IV  750 mg Intravenous Q48H  . levothyroxine  75 mcg Intravenous Daily  . pantoprazole (PROTONIX) IV  40 mg Intravenous Q12H  . sodium chloride  3 mL Intravenous Q12H  . vancomycin  500 mg Intravenous Q12H    Assessment: 80 year-old female with small bowel obstruction. On chronic warfarin for PAF and a history of DVT, PE, and IVC filter placement.  Usual home dose is documented as 2.5 mg daily except for 1.25 mg on M-W-F.  Warfarin held on admission due to SBO, ok per Dr. Doyle Askew on 1/8 to resume. Also given ok to start lovenox if INR < 2 but none given due to supratherapeutic INR (3.2)  INR supratherapeutic and rising (3.76) despite withholding since admission.  CBC stable, no bleeding reported  Clear liquid diet started 1/8 but very little intake recorded - likely culprit of rising INR  Antibiotics  adjusted to vancomycin and levaquin on 1/8 - note interaction with warfarin, can increase sensitivity and further elevate INR.  Goals of Therapy:   INR = 2-3  Monitor platelets by anticoagulation protocol: Yes  Prevention of thromboembolism   Plan:   No warfarin today and no lovenox needed  Monitor PT/INR daily   Peggyann Juba, PharmD, BCPS Pager: [phone]  01/22/2013  8:20 AM

## 2013-01-22 NOTE — Telephone Encounter (Signed)
[name] called because she said 6 days ago pt was vomiting and found out she had bowel obstruction. All is clear now, but after she was admitted the medication amiodarone was discontinued.. Pt had asked several times that she needed to take this medication for A-Fib but the medication was held. Pt had an episode of increased heart rate , so the MD order to give pt one dose of amiodarone. Pt's  heart start to increased very high and pt had to be transferred to another unit with heart monitors. [name] states that the cardiologist have not seen the pt. [name] is afraid that something bad would happened to pt. Daughter was reassured that pt having heart problems a cardiologist  rounding in the hospital will see pt. Also daughter was made aware that she can talk with pt's present doctors and the nurse to voice her concerns. Daughter verbalized understanding. Daughter is aware that Dr. [name] is on vacation, but she wants for MD to be aware, so this message will be send to Dr. [name].

## 2013-01-22 NOTE — Progress Notes (Signed)
Pt HR 140 non sustaining.  MD notified.  No chest pain.

## 2013-01-22 NOTE — Progress Notes (Signed)
PT Cancellation Note  Patient Details Name: IMOGENE MACCARONE MRN: [id number] DOB: 1920/09/29   Cancelled Treatment:    Reason Eval/Treat Not Completed: Medical issues which prohibited therapy--increased HR at rest. Will hold PT at this time and check back another day. thanks.    Weston Anna, MPT Pager: [phone]

## 2013-01-22 NOTE — Progress Notes (Signed)
FYI -- pt and family requesting for pt's cardiologist to be told of pt's condition. Dr. Daneen Schick at [facility] (which just merged with [facility] ?).

## 2013-01-22 NOTE — Progress Notes (Signed)
Pt complained of not "feeling well'. Checked vital signs. BP 16199 O2 95% and pulse was irregular, ranging from 108-128 bpm. Paged on call hospitalist Did EKG and found pt to be in A Fib with RVR. Rec'd orders to transfer pt to telemetry and give 5 mg lopressor. Pt is stable. Pulse is 98-114 bpm. Will continue to monitor until transfer.

## 2013-01-22 NOTE — Telephone Encounter (Signed)
New message   Patient at hospital - [place].   Daughter has some questions & concerns.   Aware that Dr. [name] is on vacation.

## 2013-01-22 NOTE — Progress Notes (Signed)
Patient ID: [name], female   DOB: Jan 25, 1920, 80 y.o.   MRN: [id number] [facility] PROGRESS NOTE  [name] [id number] DOB: January 04, 1921 DOA: 01/17/2013 PCP: Brittany Kroner, MD  Brief narrative:  80 y.o. year-old female with history of PAF, DVT and PE on Brittany/C and s/p IVC filter, microscopic colitis and hx of self-resolved SBO, temporal arteritis no longer on prednisone, hypothyroidism, blindness, spinal stenosis who presented to [facility] ED with sharp and constant LUQ abdominal pain that started several days PTA, associated with non bloody diarrhea and bilious vomiting.   In the ED, WBC 13.1, BUN 45 Cr 1.36, INR 2.64. CT scan of the abdomen and pelvis demonstrated Brittany distal SBO with no perforation or abscess. Incidentally, right middle and left lower lobe airspace disease was noted and suspicious for PNA. General surgery has been consultative.   Principal Problem:  SBO (small bowel obstruction)  - pt reports feeling better this AM, NGT is out and pt tolerating clear liquids  - plan on advancing diet to soft if pt able to tolerate  - appreciate surgery and GI assistance  - IVF discontinued 01/08 given more congestion on exam  Active Problems:  Atrial fibrillation  - HR 110 - 120 over the past 24 hours - amiodarone restarted and Metoprolol added as well as needed - cardiology team consulted for further assistance  Acute respiratory failure  - oxygen saturation remains at target range  - CXR with worsening multilobar PNA adn mild vascular congestion  - ABX changed to Vancomycin and Levaquin and today is the day #2/7 - sputum analysis, culture and gram stain negative to date  Pulmonary embolus  - will resume Coumadin as per pharmacy  Nausea and vomiting  - secondary to SBO, opt clinically improving and tolerating clear liquid diet  - advance diet as tolerating  - continue to provide supportive care with analgesia as needed  Leukocytosis  - secondary to PNA and SBO  - WBC  trending down and WNL this AM  CKD (chronic kidney disease), stage IV  - remains stable and at baseline, WNL this AM  - BMP in AM  Hypokalemia  - secondary to vomiting and diarrhea  - supplemented and within normal limits this AM  - BMP in AM   Consultants:  GI  Surgery  Cardiology  Procedures/Studies:  Dg Abd Portable 1v 01/18/2013 Nasogastric tube tip in proximal stomach with side port at the gastroesophageal junction. Advise advancing nasogastric tube 6 to 8 cm. Less bowel dilatation compared to earlier in the day. Contrast is seen in the cecum. No free air seen on this supine examination.  Dg Chest 2 View 01/21/2013 Increased bibasilar infiltrates consistent with pneumonia. Underlying COPD and bibasilar effusions.  Dg Abd 2 Views 01/21/2013 Few persistent mildly dilated small bowel loops.  Antibiotics:  Flagyl 01/04 --> 01/07  Zithromax 01/04 --> 01/07  Levaquin 01/07 -->  Vancomycin 01/07 -->  Code Status: DNR  Family Communication: Pt and daughter at bedside  Disposition Plan: Home when medically stable   HPI/Subjective: No events overnight.   Objective: Filed Vitals:   01/21/13 2257 01/22/13 0205 01/22/13 0245 01/22/13 0500  BP:  161/99  124/60  Pulse: 80 121 108 111  Temp:    98.4 F (36.9 C)  TempSrc:    Oral  Resp:    18  Height:      Weight:      SpO2:  96%  97%    Intake/Output Summary (Last 24 hours)  at 01/22/13 1132 Last data filed at 01/22/13 0140  Gross per 24 hour  Intake    760 ml  Output    475 ml  Net    285 ml    Exam:   General:  Pt is alert, follows commands appropriately, not in acute distress  Cardiovascular: irregular rate and rhythm, no rubs, no gallops  Respiratory: Clear to auscultation bilaterally, bibasilar crackles with mild rhonchi at bases  Abdomen: Soft, non tender, non distended, bowel sounds present, no guarding  Extremities: No edema, pulses DP and PT palpable bilaterally  Neuro: Grossly nonfocal  Data Reviewed: Basic  Metabolic Panel:  Recent Labs Lab 01/18/13 2230 01/19/13 0950 01/20/13 0604 01/21/13 0537 01/22/13 0504  NA 141 142 142 142 138  K 3.7 3.7 3.5* 3.7 3.9  CL 106 105 107 107 103  CO2 25 24 19 20 21   GLUCOSE 103* 88 84 110* 114*  BUN 26* 22 19 16 14   CREATININE 1.10 0.97 0.93 0.83 0.81  CALCIUM 8.2* 8.4 8.3* 8.1* 8.2*   Liver Function Tests:  Recent Labs Lab 01/17/13 0632 01/18/13 2230  AST 30 29  ALT 23 31  ALKPHOS 100 66  BILITOT 0.5 0.7  PROT 7.5 5.7*  ALBUMIN 4.2 3.0*    Recent Labs Lab 01/17/13 0632  LIPASE 36   CBC:  Recent Labs Lab 01/17/13 0632  01/18/13 2230 01/19/13 0950 01/20/13 0604 01/21/13 0537 01/22/13 0504  WBC 13.1*  < > 10.6* 9.9 9.7 8.9 10.1  NEUTROABS 11.5*  --   --   --   --   --   --   HGB 13.0  < > 10.9* 10.7* 10.9* 10.3* 11.2*  HCT 39.3  < > 34.5* 32.8* 34.5* 32.1* 33.1*  MCV 93.1  < > 94.5 94.3 94.5 93.0 89.5  PLT 229  < > 180 186 199 212 240  < > = values in this interval not displayed.  Cardiac Enzymes:  Recent Labs Lab 01/17/13 1247 01/17/13 1834 01/18/13 0054  TROPONINI <0.30 <0.30 <0.30     Recent Results (from the past 240 hour(s))  CULTURE, EXPECTORATED SPUTUM-ASSESSMENT     Status: None   Collection Time    01/21/13  4:50 PM      Result Value Range Status   Specimen Description SPUTUM   Final   Special Requests NONE   Final   Sputum evaluation     Final   Value: MICROSCOPIC FINDINGS SUGGEST THAT THIS SPECIMEN IS NOT REPRESENTATIVE OF LOWER RESPIRATORY SECRETIONS. PLEASE RECOLLECT.     Brittany Albright RN [id number] 01/21/13 [name]   Report Status 01/21/2013 FINAL   Final  CULTURE, EXPECTORATED SPUTUM-ASSESSMENT     Status: None   Collection Time    01/22/13  2:16 AM      Result Value Range Status   Specimen Description SPUTUM   Final   Special Requests Normal   Final   Sputum evaluation     Final   Value: THIS SPECIMEN IS ACCEPTABLE. RESPIRATORY CULTURE REPORT TO FOLLOW.   Report Status 01/22/2013 FINAL    Final     Scheduled Meds: . amiodarone  100 mg Oral Daily  . antiseptic oral rinse  15 mL Mouth Rinse q12n4p  . chlorhexidine  15 mL Mouth Rinse BID  . levofloxacin (LEVAQUIN) IV  750 mg Intravenous Q48H  . levothyroxine  75 mcg Intravenous Daily  . [START ON 01/23/2013] mesalamine  4.8 g Oral Q breakfast  . pantoprazole (PROTONIX)  IV  40 mg Intravenous Q12H  . sodium chloride  3 mL Intravenous Q12H  . vancomycin  500 mg Intravenous Q12H   Continuous Infusions:    Faye Ramsay, MD  [facility] Pager [phone]  If 7PM-7AM, please contact night-coverage [email or web address] Password [email or web address] 01/22/2013, 11:32 AM   LOS: 5 days

## 2013-01-22 NOTE — Progress Notes (Signed)
[name] 9:43 AM  Subjective: Patient doing well from a GI standpoint and having increased bowel movements but no pain or nausea and her primary complaints are shortness of breath  Objective: Vital signs stable afebrile no acute distress abdomen is soft nontender good bowel sound labs stable  Assessment: Resolved small bowel obstruction in patient with history of microscopic colitis  Plan: Will advance diet to full liquids please call us if we can be of any further assistance otherwise will resume her home colitis medicine lialda however if diarrhea continues might need a C. difficile to be sure and I will see her back in the office in a few weeks after discharge and discussed all the above with both the patient and her daughter  [facility]

## 2013-01-23 ENCOUNTER — Other Ambulatory Visit (HOSPITAL_COMMUNITY): Payer: Medicare Other

## 2013-01-23 ENCOUNTER — Inpatient Hospital Stay (HOSPITAL_COMMUNITY): Payer: Medicare Other

## 2013-01-23 DIAGNOSIS — I369 Nonrheumatic tricuspid valve disorder, unspecified: Secondary | ICD-10-CM

## 2013-01-23 LAB — CBC
HCT: 32.5 % — ABNORMAL LOW (ref 36.0–46.0)
Hemoglobin: 10.6 g/dL — ABNORMAL LOW (ref 12.0–15.0)
MCH: 29.9 pg (ref 26.0–34.0)
MCHC: 32.6 g/dL (ref 30.0–36.0)
MCV: 91.5 fL (ref 78.0–100.0)
PLATELETS: 215 10*3/uL (ref 150–400)
RBC: 3.55 MIL/uL — AB (ref 3.87–5.11)
RDW: 14.5 % (ref 11.5–15.5)
WBC: 9.3 10*3/uL (ref 4.0–10.5)

## 2013-01-23 LAB — BASIC METABOLIC PANEL
BUN: 17 mg/dL (ref 6–23)
CALCIUM: 8 mg/dL — AB (ref 8.4–10.5)
CO2: 23 mEq/L (ref 19–32)
Chloride: 103 mEq/L (ref 96–112)
Creatinine, Ser: 1.03 mg/dL (ref 0.50–1.10)
GFR, EST AFRICAN AMERICAN: 54 mL/min — AB (ref >90)
GFR, EST NON AFRICAN AMERICAN: 46 mL/min — AB (ref >90)
Glucose, Bld: 97 mg/dL (ref 70–99)
POTASSIUM: 3.6 meq/L — AB (ref 3.7–5.3)
SODIUM: 138 meq/L (ref 137–147)

## 2013-01-23 LAB — PROTIME-INR
INR: 3.2 — AB (ref 0.00–1.49)
Prothrombin Time: 31.6 seconds — ABNORMAL HIGH (ref 11.6–15.2)

## 2013-01-23 MED ORDER — FUROSEMIDE 40 MG PO TABS
40.0000 mg | ORAL_TABLET | Freq: Once | ORAL | Status: AC
  Administered 2013-01-23: 11:00:00 40 mg via ORAL
  Filled 2013-01-23: qty 1

## 2013-01-23 MED ORDER — POTASSIUM CHLORIDE CRYS ER 10 MEQ PO TBCR
10.0000 meq | EXTENDED_RELEASE_TABLET | Freq: Three times a day (TID) | ORAL | Status: DC
  Administered 2013-01-23 – 2013-01-27 (×14): 10 meq via ORAL
  Filled 2013-01-23 (×15): qty 1

## 2013-01-23 NOTE — Progress Notes (Addendum)
Patient ID: [name], female   DOB: 05/12/1920, 80 y.o.   MRN: [id number]  [facility] PROGRESS NOTE  [name] [id number] DOB: November 27, 1920 DOA: 01/17/2013 PCP: [name], MD  Brief narrative:  80 y.o. year-old female with history of PAF, DVT and PE on A/C and s/p IVC filter, microscopic colitis and hx of self-resolved SBO, temporal arteritis no longer on prednisone, hypothyroidism, blindness, spinal stenosis who presented to [facility] ED with sharp and constant LUQ abdominal pain that started several days PTA, associated with non bloody diarrhea and bilious vomiting.   In the ED, WBC 13.1, BUN 45 Cr 1.36, INR 2.64. CT scan of the abdomen and pelvis demonstrated a distal SBO with no perforation or abscess. Incidentally, right middle and left lower lobe airspace disease was noted and suspicious for PNA. General surgery has been consultative.    Principal Problem:  SBO (small bowel obstruction)  - pt reports feeling better this AM, NGT is out and pt tolerating soft solids  - plan on advancing diet as pt able to tolerate  - appreciate surgery and GI assistance  - IVF discontinued 01/08 given more congestion on exam  Active Problems:  Atrial fibrillation  - pt has spontaneously converted to NSR  - amiodarone restarted and Metoprolol added as well as needed  - cardiology team consulted and recommendations appreciated - one dose of lasix 40 mg PO given and CXR , 2 D ECHO ordered for further evaluation  Acute respiratory failure  - oxygen saturation remains at target range  - CXR with worsening multilobar PNA adn mild vascular congestion  - ABX changed to Vancomycin and Levaquin and today is the day #3/7 - sputum analysis, culture and gram stain negative to date  Pulmonary embolus  - Coumadin as per pharmacy  Nausea and vomiting  - secondary to SBO, opt clinically improving and tolerating clear liquid diet  - advance diet as tolerating  - continue to provide supportive care  with analgesia as needed  Leukocytosis  - secondary to PNA and SBO  - WBC trending down and WNL this AM  CKD (chronic kidney disease), stage IV  - remains stable and at baseline, WNL this AM  - BMP in AM  Hypokalemia  - secondary to vomiting and diarrhea  - supplement as indicated - BMP in AM   Consultants:  GI  Surgery  Cardiology  Procedures/Studies:  Dg Abd Portable 1v 01/18/2013 Nasogastric tube tip in proximal stomach with side port at the gastroesophageal junction. Advise advancing nasogastric tube 6 to 8 cm. Less bowel dilatation compared to earlier in the day. Contrast is seen in the cecum. No free air seen on this supine examination.  Dg Chest 2 View 01/21/2013 Increased bibasilar infiltrates consistent with pneumonia. Underlying COPD and bibasilar effusions.  Dg Abd 2 Views 01/21/2013 Few persistent mildly dilated small bowel loops.  Antibiotics:  Flagyl 01/04 --> 01/07  Zithromax 01/04 --> 01/07  Levaquin 01/07 -->  Vancomycin 01/07 -->  Code Status: DNR  Family Communication: Pt and daughter at bedside  Disposition Plan: Home when medically stable   HPI/Subjective: No events overnight.   Objective: Filed Vitals:   01/22/13 0500 01/22/13 1444 01/22/13 2236 01/23/13 0654  BP: 124/60 136/84 164/65 155/57  Pulse: 111 129 75 75  Temp: 98.4 F (36.9 C) 98.1 F (36.7 C) 97.9 F (36.6 C) 97.6 F (36.4 C)  TempSrc: Oral Oral Oral Oral  Resp: 18 18 18 16   Height:  Weight:    63.1 kg (139 lb 1.8 oz)  SpO2: 97% 96% 100% 95%    Intake/Output Summary (Last 24 hours) at 01/23/13 [id number] Last data filed at 01/23/13 [id number]  Gross per 24 hour  Intake    500 ml  Output    550 ml  Net    -50 ml    Exam:   General:  Pt is alert, follows commands appropriately, not in acute distress  Cardiovascular: Regular rate and rhythm, S1/S2, no murmurs, no rubs, no gallops  Respiratory: Clear to auscultation bilaterally with bibasilar crackles and minimal rhonchi   Abdomen:  Soft, non tender, non distended, bowel sounds present, no guarding  Extremities: No edema, pulses DP and PT palpable bilaterally  Neuro: Grossly nonfocal  Data Reviewed: Basic Metabolic Panel:  Recent Labs Lab 01/19/13 0950 01/20/13 0604 01/21/13 0537 01/22/13 0504 01/23/13 0430  NA 142 142 142 138 138  K 3.7 3.5* 3.7 3.9 3.6*  CL 105 107 107 103 103  CO2 24 19 20 21 23   GLUCOSE 88 84 110* 114* 97  BUN 22 19 16 14 17   CREATININE 0.97 0.93 0.83 0.81 1.03  CALCIUM 8.4 8.3* 8.1* 8.2* 8.0*   Liver Function Tests:  Recent Labs Lab 01/17/13 0632 01/18/13 2230  AST 30 29  ALT 23 31  ALKPHOS 100 66  BILITOT 0.5 0.7  PROT 7.5 5.7*  ALBUMIN 4.2 3.0*    Recent Labs Lab 01/17/13 0632  LIPASE 36   CBC:  Recent Labs Lab 01/17/13 0632  01/19/13 0950 01/20/13 0604 01/21/13 0537 01/22/13 0504 01/23/13 0430  WBC 13.1*  < > 9.9 9.7 8.9 10.1 9.3  NEUTROABS 11.5*  --   --   --   --   --   --   HGB 13.0  < > 10.7* 10.9* 10.3* 11.2* 10.6*  HCT 39.3  < > 32.8* 34.5* 32.1* 33.1* 32.5*  MCV 93.1  < > 94.3 94.5 93.0 89.5 91.5  PLT 229  < > 186 199 212 240 215  < > = values in this interval not displayed. Cardiac Enzymes:  Recent Labs Lab 01/17/13 1247 01/17/13 1834 01/18/13 0054  TROPONINI <0.30 <0.30 <0.30   Recent Results (from the past 240 hour(s))  CULTURE, EXPECTORATED SPUTUM-ASSESSMENT     Status: None   Collection Time    01/21/13  4:50 PM      Result Value Range Status   Specimen Description SPUTUM   Final   Special Requests NONE   Final   Sputum evaluation     Final   Value: MICROSCOPIC FINDINGS SUGGEST THAT THIS SPECIMEN IS NOT REPRESENTATIVE OF LOWER RESPIRATORY SECRETIONS. PLEASE RECOLLECT.     Rhodia Albright RN [id number] 01/21/13 A NAVARRO   Report Status 01/21/2013 FINAL   Final  CULTURE, EXPECTORATED SPUTUM-ASSESSMENT     Status: None   Collection Time    01/22/13  2:16 AM      Result Value Range Status   Specimen Description SPUTUM   Final    Special Requests Normal   Final   Sputum evaluation     Final   Value: THIS SPECIMEN IS ACCEPTABLE. RESPIRATORY CULTURE REPORT TO FOLLOW.   Report Status 01/22/2013 FINAL   Final  CULTURE, RESPIRATORY (NON-EXPECTORATED)     Status: None   Collection Time    01/22/13  2:16 AM      Result Value Range Status   Specimen Description SPUTUM   Final   Special Requests NONE  Final   Gram Stain     Final   Value: NO WBC SEEN     RARE SQUAMOUS EPITHELIAL CELLS PRESENT     NO ORGANISMS SEEN     Performed at [facility]   Culture PENDING   Incomplete   Report Status PENDING   Incomplete     Scheduled Meds: . amiodarone  100 mg Oral BID  . levofloxacin IV  750 mg Intravenous Q48H  . levothyroxine  75 mcg Intravenous Daily  . mesalamine  4.8 g Oral Q breakfast  . pantoprazole  IV  40 mg Intravenous Q12H  . vancomycin  500 mg Intravenous Q12H   Continuous Infusions:   Faye Ramsay, MD  [facility] Pager [phone]  If 7PM-7AM, please contact night-coverage [email or web address] Password [email or web address] 01/23/2013, 7:02 AM   LOS: 6 days

## 2013-01-23 NOTE — Progress Notes (Signed)
Echocardiogram 2D Echocardiogram has been performed.  [name] 01/23/2013, 10:35 AM

## 2013-01-23 NOTE — Progress Notes (Signed)
Patient Name: [name] Date of Encounter: 01/23/2013     Principal Problem:   SBO (small bowel obstruction) Active Problems:   Unspecified disorder of thyroid   Atrial fibrillation   Pulmonary embolus   Nausea and vomiting   Microscopic colitis   Leukocytosis   CKD (chronic kidney disease), stage IV   Aspiration pneumonia    SUBJECTIVE  The patient feels better today.  She converted back to normal sinus rhythm during the night.  She is still quite dyspneic when walking back and forth to the bathroom.  Her pro BNP is greater than 10,000 consistent with fluid overload.  CURRENT MEDS . amiodarone  100 mg Oral BID  . antiseptic oral rinse  15 mL Mouth Rinse q12n4p  . chlorhexidine  15 mL Mouth Rinse BID  . levofloxacin (LEVAQUIN) IV  750 mg Intravenous Q48H  . levothyroxine  75 mcg Intravenous Daily  . mesalamine  4.8 g Oral Q breakfast  . pantoprazole (PROTONIX) IV  40 mg Intravenous Q12H  . sodium chloride  3 mL Intravenous Q12H  . vancomycin  500 mg Intravenous Q12H    OBJECTIVE  Filed Vitals:   01/22/13 0500 01/22/13 1444 01/22/13 2236 01/23/13 0654  BP: 124/60 136/84 164/65 155/57  Pulse: 111 129 75 75  Temp: 98.4 F (36.9 C) 98.1 F (36.7 C) 97.9 F (36.6 C) 97.6 F (36.4 C)  TempSrc: Oral Oral Oral Oral  Resp: 18 18 18 16   Height:      Weight:    139 lb 1.8 oz (63.1 kg)  SpO2: 97% 96% 100% 95%    Intake/Output Summary (Last 24 hours) at 01/23/13 0828 Last data filed at 01/23/13 0728  Gross per 24 hour  Intake    600 ml  Output    650 ml  Net    -50 ml   Filed Weights   01/17/13 1217 01/23/13 0654  Weight: 136 lb 14.5 oz (62.1 kg) 139 lb 1.8 oz (63.1 kg)    PHYSICAL EXAM  General: Pleasant, NAD. Neuro: Alert and oriented X 3. Moves all extremities spontaneously. Psych: Normal affect. HEENT:  Normal  Neck: Supple without bruits or JVD. Lungs:  Rales at the bases left greater than right Heart: RRR no s3, s4, or murmurs. Abdomen: Soft,  non-tender, non-distended, BS + x 4.  Extremities: No clubbing, cyanosis or edema. DP/PT/Radials 2+ and equal bilaterally.  Accessory Clinical Findings  CBC  Recent Labs  01/22/13 0504 01/23/13 0430  WBC 10.1 9.3  HGB 11.2* 10.6*  HCT 33.1* 32.5*  MCV 89.5 91.5  PLT 240 [id number]   Basic Metabolic Panel  Recent Labs  01/22/13 0504 01/23/13 0430  NA 138 138  K 3.9 3.6*  CL 103 103  CO2 21 23  GLUCOSE 114* 97  BUN 14 17  CREATININE 0.81 1.03  CALCIUM 8.2* 8.0*   Liver Function Tests No results found for this basename: AST, ALT, ALKPHOS, BILITOT, PROT, ALBUMIN,  in the last 72 hours No results found for this basename: LIPASE, AMYLASE,  in the last 72 hours Cardiac Enzymes No results found for this basename: CKTOTAL, CKMB, CKMBINDEX, TROPONINI,  in the last 72 hours BNP No components found with this basename: POCBNP,  D-Dimer No results found for this basename: DDIMER,  in the last 72 hours Hemoglobin A1C No results found for this basename: HGBA1C,  in the last 72 hours Fasting Lipid Panel No results found for this basename: CHOL, HDL, LDLCALC, TRIG, CHOLHDL, LDLDIRECT,  in the last 72 hours Thyroid Function Tests No results found for this basename: TSH, T4TOTAL, FREET3, T3FREE, THYROIDAB,  in the last 72 hours  TELE  Telemetry shows normal sinus rhythm  ECG     Radiology/Studies  Dg Chest 2 View  01/21/2013   CLINICAL DATA:  Decreased oxygen saturation  EXAM: CHEST  2 VIEW  COMPARISON:  01/17/2013  FINDINGS: Minimal enlargement of cardiac silhouette.  Pulmonary vascular congestion.  Bibasilar opacities consistent with pneumonia increased since previous exam.  Bibasilar pleural effusions dependently.  Underlying COPD.  No pneumothorax.  Bones demineralized.  IMPRESSION: Increased bibasilar infiltrates consistent with pneumonia.  Underlying COPD and bibasilar effusions.   Electronically Signed   By: Lavonia Dana M.D.   On: 01/21/2013 10:57   Ct Abdomen Pelvis W  Contrast  01/17/2013   CLINICAL DATA:  Abdominal pain with emesis this morning. History of megacolon. Mild leukocytosis.  EXAM: CT ABDOMEN AND PELVIS WITH CONTRAST  TECHNIQUE: Multidetector CT imaging of the abdomen and pelvis was performed using the standard protocol following bolus administration of intravenous contrast.  CONTRAST:  26mL OMNIPAQUE IOHEXOL 300 MG/ML SOLN, 47mL OMNIPAQUE IOHEXOL 300 MG/ML SOLN  COMPARISON:  Ultrasound 04/25/2010.  CT 01/01/2010.  FINDINGS: On initial scanning, the IV tubing leaked resulting in imaging prior to contrast administration. After securing the IV line, contrast was reinjected and the scan was repeated. Patient was not charged for the noncontrast portion of this study.  There is new patchy airspace disease in the right middle lobe. Patchy left lower lobe airspace disease is similar to the prior examination. There is mild underlying emphysema. No significant pleural or pericardial effusion is present.  The liver, gallbladder and biliary system appear unremarkable. There is chronic fatty replacement of the pancreas without focal abnormality. The spleen, adrenal glands and kidneys appear unremarkable.  There is moderate dilatation of the stomach and proximal to mid small bowel. The distal small bowel is decompressed. Area of transition appears to be in the right false pelvis where there appears to be some twirling of the mesentery on the sagittal images, suggesting a possible internal hernia. No bowel wall hernias are identified. No surrounding mass is identified. There is no small bowel wall thickening, extraluminal fluid collection or pneumatosis.  The colon is decompressed. There is sigmoid colon atherosclerosis without surrounding inflammatory change. The uterus, ovaries and bladder appear normal.  There is diffuse aortoiliac atherosclerosis. IVC filter is again noted with penetration of the limbs beyond on the walls. There is no retroperitoneal hematoma or  lymphadenopathy.  Thoracolumbar scoliosis and spondylosis are stable. There is stable chronic right femoral head avascular necrosis without subchondral collapse.  IMPRESSION: 1. Distal small bowel obstruction with apparent area of transition in the right false pelvis. There is a possible internal hernia in this region. No bowel wall hernia identified. 2. No evidence of perforation or abscess. 3. Sigmoid colon diverticulosis. 4. Right middle and left lower lobe airspace disease suspicious for pneumonia.   Electronically Signed   By: Camie Patience M.D.   On: 01/17/2013 09:09   Dg Chest Port 1 View  01/17/2013   CLINICAL DATA:  CT scan revealed abnormal opacities at the lung bases.  EXAM: PORTABLE CHEST - 1 VIEW  COMPARISON:  01/17/2013; 10/13/2012  FINDINGS: Hazy airspace opacities are present in the right middle lobe, left lower lobe, and possibly the lingula. These are new compared to 10/13/12.  Heart size within normal limits.  Nasogastric tube tip:  Stomach.  IMPRESSION: 1.  Right middle lobe, left lower lobe, and possibly lingular airspace opacities favoring multi lobar pneumonia.   Electronically Signed   By: Sherryl Barters M.D.   On: 01/17/2013 11:49   Dg Abd 2 Views  01/21/2013   CLINICAL DATA:  Abdominal distention, small bowel obstruction  EXAM: ABDOMEN - 2 VIEW  COMPARISON:  01/18/2013  FINDINGS: Tip of nasogastric tube projects over gastric antrum.  IVC filter present.  Few mildly distended loops of small bowel are again seen in the abdomen.  Small amount of contrast and gas within colon.  No definite bowel wall thickening or free intraperitoneal air.  Osseous demineralization with degenerative changes and scoliosis of the lumbar spine.  IMPRESSION: Few persistent mildly dilated small bowel loops.   Electronically Signed   By: Lavonia Dana M.D.   On: 01/21/2013 08:13   Dg Abd Portable 1v  01/18/2013   CLINICAL DATA:  Small bowel obstruction  EXAM: PORTABLE ABDOMEN - 1 VIEW  COMPARISON:  Study obtained  earlier in the day  FINDINGS: Nasogastric tube tip is in the proximal stomach with the side port at the gastroesophageal junction. There is less bowel dilatation compared to earlier in the day. There is contrast in the small bowel and cecum. Inferior vena cava filter position unchanged. There is scoliosis and degenerative change in the lumbar spine. No free air is seen on this supine examination.  IMPRESSION: Nasogastric tube tip in proximal stomach with side port at the gastroesophageal junction. Advise advancing nasogastric tube 6 to 8 cm.  Less bowel dilatation compared to earlier in the day. Contrast is seen in the cecum.  No free air seen on this supine examination.  Stable lumbar arthropathy.   Electronically Signed   By: Lowella Grip M.D.   On: 01/18/2013 23:03   Dg Abd Portable 1v  01/18/2013   CLINICAL DATA:  Distended abdomen with abdominal pain.  EXAM: PORTABLE ABDOMEN - 1 VIEW  COMPARISON:  01/17/2013.  FINDINGS: The small bowel obstruction pattern persists, with maximal diameter of small bowel loops in the central abdomen measuring 43 mm. No gross plain film evidence of free air on this supine radiograph. IVC filter again noted. Stool and bowel gas remains present within the rectosigmoid. Bilateral femoral head AVN is incidentally noted.  IMPRESSION: Persistent small bowel obstruction. Maximal diameter of small bowel is 43 mm.   Electronically Signed   By: Dereck Ligas M.D.   On: 01/18/2013 07:59    ASSESSMENT AND PLAN 1. paroxysmal atrial flutter, resolved 2. dyspnea secondary to fluid overload with elevated proBNP.  2-D echo pending. 3. Pneumonitis 4. recent small bowel obstruction resolved  Plan: Continue amiodarone 100 mg twice a day.  Await 2-D echo.  We will get a chest x-ray today.  She still has rales and we will give Lasix 40 mg by mouth today.  Replete potassium.  Signed, Darlin Coco MD

## 2013-01-23 NOTE — Progress Notes (Signed)
Received notification from [place] around 1950 that pt heart rhythm had been converted from atrial fibrillation to sinus rhythm. On-call MD paged and notified. Patient resting very comfortably tonight. Has continued to be in sinus rhythm. Will continue to monitor closely. Blanchard Kelch, RN

## 2013-01-23 NOTE — Progress Notes (Signed)
ANTICOAGULATION CONSULT NOTE - Follow Up Consult  Pharmacy Consult for:  Warfarin Indication:  Paroxysmal atrial fibrillation, history of DVT and PE  Allergies  Allergen Reactions  . Cefdinir     Caused bad diarrhea  . Cortisone     Flushed hands and face   . Prednisone     cuased blood clots  . Sulfonamide Derivatives     Stomach pains and cramping     Patient Measurements: Height: 5\' 6"  (167.6 cm) Weight: 139 lb 1.8 oz (63.1 kg) IBW/kg (Calculated) : 59.3  Vital Signs: Temp: 97.6 F (36.4 C) (01/10 0654) Temp src: Oral (01/10 0654) BP: 155/57 mmHg (01/10 0654) Pulse Rate: 75 (01/10 0654)  Labs:  Recent Labs  01/21/13 0537 01/22/13 0504 01/23/13 0430  HGB 10.3* 11.2* 10.6*  HCT 32.1* 33.1* 32.5*  PLT 212 240 215  LABPROT 31.6* 35.7* 31.6*  INR 3.20* 3.76* 3.20*  CREATININE 0.83 0.81 1.03    Estimated Creatinine Clearance: 34 ml/min (by C-G formula based on Cr of 1.03).  Assessment: 80 year-old female with small bowel obstruction. On chronic warfarin for PAF and a history of DVT, PE, and IVC filter placement.  Usual home dose is documented as 2.5 mg daily except for 1.25 mg on M-W-F.  Warfarin held on admission due to SBO, ok per Dr. Doyle Askew on 1/8 to resume. Also given ok to start lovenox if INR < 2 but none given due to supratherapeutic INR.  INR remains supratherapeutic (3.20) despite withholding since admission.  CBC stable, no bleeding reported  Clear liquid diet started 1/8 but very little intake recorded - likely culprit of rising INR  Drug Interactions:  Antibiotics adjusted to vancomycin and levaquin on 1/8 - note interaction with warfarin, can increase sensitivity and further elevate INR.    Amiodarone started 1/9 which is a significant drug interaction with warfarin; however, most likely will not see effects on INR until further into treatment due to extremely long half life.  Note patient's warfarin regimen PTA will most likely need a dose  reduction and close INR f/u to watch for this interaction.  Goals of Therapy:   INR = 2-3  Monitor platelets by anticoagulation protocol: Yes  Prevention of thromboembolism   Plan:   No warfarin today and no lovenox needed  Monitor PT/INR daily   Hershal Coria, PharmD, BCPS Pager: [phone] 01/23/2013 1:35 PM

## 2013-01-24 LAB — PROTIME-INR
INR: 2.91 — ABNORMAL HIGH (ref 0.00–1.49)
Prothrombin Time: 29.4 seconds — ABNORMAL HIGH (ref 11.6–15.2)

## 2013-01-24 LAB — CBC
HCT: 31.7 % — ABNORMAL LOW (ref 36.0–46.0)
Hemoglobin: 10.5 g/dL — ABNORMAL LOW (ref 12.0–15.0)
MCH: 30.1 pg (ref 26.0–34.0)
MCHC: 33.1 g/dL (ref 30.0–36.0)
MCV: 90.8 fL (ref 78.0–100.0)
Platelets: 238 10*3/uL (ref 150–400)
RBC: 3.49 MIL/uL — ABNORMAL LOW (ref 3.87–5.11)
RDW: 14.6 % (ref 11.5–15.5)
WBC: 8.5 10*3/uL (ref 4.0–10.5)

## 2013-01-24 LAB — BASIC METABOLIC PANEL
BUN: 13 mg/dL (ref 6–23)
CO2: 25 mEq/L (ref 19–32)
Calcium: 7.9 mg/dL — ABNORMAL LOW (ref 8.4–10.5)
Chloride: 105 mEq/L (ref 96–112)
Creatinine, Ser: 0.96 mg/dL (ref 0.50–1.10)
GFR calc Af Amer: 59 mL/min — ABNORMAL LOW (ref >90)
GFR, EST NON AFRICAN AMERICAN: 51 mL/min — AB (ref >90)
Glucose, Bld: 99 mg/dL (ref 70–99)
Potassium: 3.8 mEq/L (ref 3.7–5.3)
SODIUM: 139 meq/L (ref 137–147)

## 2013-01-24 LAB — CULTURE, RESPIRATORY
CULTURE: NO GROWTH
Gram Stain: NONE SEEN

## 2013-01-24 LAB — VANCOMYCIN, TROUGH: Vancomycin Tr: 12.7 ug/mL (ref 10.0–20.0)

## 2013-01-24 MED ORDER — VANCOMYCIN HCL IN DEXTROSE 750-5 MG/150ML-% IV SOLN
750.0000 mg | Freq: Two times a day (BID) | INTRAVENOUS | Status: DC
  Administered 2013-01-25: 750 mg via INTRAVENOUS
  Filled 2013-01-24 (×2): qty 150

## 2013-01-24 MED ORDER — FUROSEMIDE 40 MG PO TABS
40.0000 mg | ORAL_TABLET | Freq: Every day | ORAL | Status: DC
  Administered 2013-01-24 – 2013-01-26 (×3): 40 mg via ORAL
  Filled 2013-01-24 (×3): qty 1

## 2013-01-24 MED ORDER — VANCOMYCIN HCL IN DEXTROSE 750-5 MG/150ML-% IV SOLN
750.0000 mg | Freq: Once | INTRAVENOUS | Status: AC
  Administered 2013-01-24: 750 mg via INTRAVENOUS
  Filled 2013-01-24: qty 150

## 2013-01-24 NOTE — Progress Notes (Signed)
ANTICOAGULATION CONSULT NOTE - Follow Up Consult  Pharmacy Consult for:  Warfarin Indication:  Paroxysmal atrial fibrillation, history of DVT and PE  Allergies  Allergen Reactions  . Cefdinir     Caused bad diarrhea  . Cortisone     Flushed hands and face   . Prednisone     cuased blood clots  . Sulfonamide Derivatives     Stomach pains and cramping     Patient Measurements: Height: 5\' 6"  (167.6 cm) Weight: 134 lb 3.2 oz (60.873 kg) IBW/kg (Calculated) : 59.3  Vital Signs: Temp: 97.5 F (36.4 C) (01/11 0703) Temp src: Oral (01/11 0703) BP: 143/63 mmHg (01/11 0703) Pulse Rate: 77 (01/11 0703)  Labs:  Recent Labs  01/22/13 0504 01/23/13 0430 01/24/13 0517  HGB 11.2* 10.6* 10.5*  HCT 33.1* 32.5* 31.7*  PLT 240 215 238  LABPROT 35.7* 31.6* 29.4*  INR 3.76* 3.20* 2.91*  CREATININE 0.81 1.03 0.96    Estimated Creatinine Clearance: 36.5 ml/min (by C-G formula based on Cr of 0.96).  Assessment: 80 year-old female with small bowel obstruction. On chronic warfarin for PAF and a history of DVT, PE, and IVC filter placement.  Usual home dose is documented as 2.5 mg daily except for 1.25 mg on M-W-F.  Warfarin held on admission due to SBO, ok per Dr. Doyle Askew on 1/8 to resume. Also given ok to start lovenox if INR < 2 but none given due to supratherapeutic INR.  INR 2.91 - warfarin has been on hold since admission 1/4  CBC stable, no bleeding reported  Dysphagia diet with very little intake recorded - likely culprit of rising INR.    Drug Interactions:  Antibiotics adjusted to vancomycin and levaquin on 1/8 - note interaction with warfarin, can increase sensitivity and further elevate INR.    Amiodarone started 1/9 which is a significant drug interaction with warfarin; however, most likely will not see effects on INR until further into treatment due to extremely long half life.  Note patient's warfarin regimen PTA will most likely need a dose reduction and close INR  f/u to watch for this interaction.  Goals of Therapy:   INR = 2-3  Monitor platelets by anticoagulation protocol: Yes  Prevention of thromboembolism   Plan:   INR within therapeutic range but hesitant to resume dosing today due to fluctuations in INR into supratherapeutic levels despite holding warfarin.  Would like to hold warfarin one more day to ensure that INR is trending down this time.  No warfarin today and no lovenox needed.  Monitor PT/INR daily.  Hershal Coria, PharmD, BCPS Pager: [phone] 01/24/2013 8:00 AM

## 2013-01-24 NOTE — Progress Notes (Signed)
ANTIBIOTIC CONSULT NOTE - FOLLOW UP  Pharmacy Consult for Vancomycin Indication: pneumonia  Allergies  Allergen Reactions  . Cefdinir     Caused bad diarrhea  . Cortisone     Flushed hands and face   . Prednisone     cuased blood clots  . Sulfonamide Derivatives     Stomach pains and cramping     Patient Measurements: Height: 5\' 6"  (167.6 cm) Weight: 134 lb 3.2 oz (60.873 kg) IBW/kg (Calculated) : 59.3  Vital Signs: Temp: 97.9 F (36.6 C) (01/11 1402) Temp src: Oral (01/11 1402) BP: 122/57 mmHg (01/11 1402) Pulse Rate: 72 (01/11 1402) Intake/Output from previous day: 01/10 0701 - 01/11 0700 In: 710 [P.O.:360; IV Piggyback:350] Out: 1800 [Urine:1800] Intake/Output from this shift: Total I/O In: 360 [P.O.:360] Out: 875 [Urine:875]  Labs:  Recent Labs  01/22/13 0504 01/23/13 0430 01/24/13 0517  WBC 10.1 9.3 8.5  HGB 11.2* 10.6* 10.5*  PLT 240 215 238  CREATININE 0.81 1.03 0.96   Estimated Creatinine Clearance: 36.5 ml/min (by C-G formula based on Cr of 0.96). No results found for this basename: Letta Median, [place], GENTTROUGH, GENTPEAK, GENTRANDOM, TOBRATROUGH, TOBRAPEAK, TOBRARND, AMIKACINPEAK, AMIKACINTROU, AMIKACIN,  in the last 72 hours   Microbiology: 1/9 Sputum: no growth to date  Anti-infectives: 1/4 >> Azithromycin >> 1/7 1/4 >> Metronidazole >> 1/8 1/5 >> Penicillin G >> 1/8 1/8 >> Levaquin >> 1/8 >> Vancomycin >>  Assessment: 31 yoF with PMHx PAF, DVT, PE on AC and s/p IVCF, microscopic colitis, and CDiff admitted to [facility] on 1/4 with abdominal pain and nausea, found to have SBO and PNA.  Pt has been treated for aspiration PNA and possible gastroenteritis with regimen to cover CDiff empirically initially, then changed to levaquin (per MD) and vancomycin per pharmacy for worsening multilobar PNA.   Day #4/7 Vancomycin 1g x1 then 500 mg IV q12h and Levaquin 750 mg IV q48h.  Repeat CXR 01/10 with improving PNA but persistent  bilateral pleural effusions.  Tm24h: Afebrile  Renal: CKD-IV. SCr 0.96, CrCl~36 ml/min  WBC WNL  Recommend continued monitoring of QTc while on Levaquin and Amiodarone.  QTc 1/11 = 440 ms per call to tele RN this afternoon.  Goal of Therapy:  Vancomycin trough level 15-20 mcg/ml  Plan:  Vancomycin trough this afternoon at 1700.  Hershal Coria 01/24/2013,3:21 PM

## 2013-01-24 NOTE — Progress Notes (Signed)
Patient Name: [name] Date of Encounter: 01/24/2013     Principal Problem:   SBO (small bowel obstruction) Active Problems:   Unspecified disorder of thyroid   Atrial fibrillation   Pulmonary embolus   Nausea and vomiting   Microscopic colitis   Leukocytosis   CKD (chronic kidney disease), stage IV   Aspiration pneumonia    SUBJECTIVE  The patient feels better.  She is less dyspneic.  Chest x-ray yesterday was reviewed by me.  The lungs are much clearer although she has bibasilar pleural effusions which are larger.  Her heart rhythm has remained normal sinus rhythm on amiodarone.  Warfarin is being administered per pharmacy.  CURRENT MEDS . amiodarone  100 mg Oral BID  . antiseptic oral rinse  15 mL Mouth Rinse q12n4p  . chlorhexidine  15 mL Mouth Rinse BID  . levofloxacin (LEVAQUIN) IV  750 mg Intravenous Q48H  . levothyroxine  75 mcg Intravenous Daily  . mesalamine  4.8 g Oral Q breakfast  . pantoprazole (PROTONIX) IV  40 mg Intravenous Q12H  . potassium chloride  10 mEq Oral TID  . sodium chloride  3 mL Intravenous Q12H  . vancomycin  500 mg Intravenous Q12H    OBJECTIVE  Filed Vitals:   01/23/13 0654 01/23/13 1400 01/23/13 2312 01/24/13 0703  BP: 155/57 141/52 128/51 143/63  Pulse: 75 75 73 77  Temp: 97.6 F (36.4 C) 97.5 F (36.4 C) 98.4 F (36.9 C) 97.5 F (36.4 C)  TempSrc: Oral Oral Oral Oral  Resp: 16 16 20 18   Height:      Weight: 139 lb 1.8 oz (63.1 kg)   134 lb 3.2 oz (60.873 kg)  SpO2: 95% 100% 96% 95%    Intake/Output Summary (Last 24 hours) at 01/24/13 0913 Last data filed at 01/24/13 0704  Gross per 24 hour  Intake    710 ml  Output   1925 ml  Net  -1215 ml   Filed Weights   01/17/13 1217 01/23/13 0654 01/24/13 0703  Weight: 136 lb 14.5 oz (62.1 kg) 139 lb 1.8 oz (63.1 kg) 134 lb 3.2 oz (60.873 kg)    PHYSICAL EXAM  General: Pleasant, NAD. Neuro: Alert and oriented X 3. Moves all extremities spontaneously. Psych: Normal  affect. HEENT:  Normal  Neck: Supple without bruits or JVD. Lungs:  Mild bibasilar rales. Heart: RRR no s3, s4, or murmurs. Abdomen: Soft, non-tender, non-distended, BS + x 4.  Extremities: No clubbing, cyanosis or edema. DP/PT/Radials 2+ and equal bilaterally.  Accessory Clinical Findings  CBC  Recent Labs  01/23/13 0430 01/24/13 0517  WBC 9.3 8.5  HGB 10.6* 10.5*  HCT 32.5* 31.7*  MCV 91.5 90.8  PLT 215 [id number]   Basic Metabolic Panel  Recent Labs  01/23/13 0430 01/24/13 0517  NA 138 139  K 3.6* 3.8  CL 103 105  CO2 23 25  GLUCOSE 97 99  BUN 17 13  CREATININE 1.03 0.96  CALCIUM 8.0* 7.9*   Liver Function Tests No results found for this basename: AST, ALT, ALKPHOS, BILITOT, PROT, ALBUMIN,  in the last 72 hours No results found for this basename: LIPASE, AMYLASE,  in the last 72 hours Cardiac Enzymes No results found for this basename: CKTOTAL, CKMB, CKMBINDEX, TROPONINI,  in the last 72 hours BNP No components found with this basename: POCBNP,  D-Dimer No results found for this basename: DDIMER,  in the last 72 hours Hemoglobin A1C No results found for this basename: HGBA1C,  in the last 72 hours Fasting Lipid Panel No results found for this basename: CHOL, HDL, LDLCALC, TRIG, CHOLHDL, LDLDIRECT,  in the last 72 hours Thyroid Function Tests No results found for this basename: TSH, T4TOTAL, FREET3, T3FREE, THYROIDAB,  in the last 72 hours  TELE  Normal sinus rhythm.  Occasional PVC.  ECG    Radiology/Studies  Dg Chest 2 View  01/23/2013   CLINICAL DATA:  Shortness of breath, weakness.  EXAM: CHEST  2 VIEW  COMPARISON:  January 21, 2013.  FINDINGS: Stable cardiomediastinal silhouette. Mild to moderate bilateral pleural effusions are noted which are increased compared to prior exam. No pneumothorax is noted. Bilateral perihilar and basilar opacities are noted which are improved compared to prior exam.  IMPRESSION: Worsening bilateral pleural effusions are  noted with right greater than left. However, bilateral perihilar and basilar opacities are improved compared to prior exam.   Electronically Signed   By: Sabino Dick M.D.   On: 01/23/2013 18:34   Dg Chest 2 View  01/21/2013   CLINICAL DATA:  Decreased oxygen saturation  EXAM: CHEST  2 VIEW  COMPARISON:  01/17/2013  FINDINGS: Minimal enlargement of cardiac silhouette.  Pulmonary vascular congestion.  Bibasilar opacities consistent with pneumonia increased since previous exam.  Bibasilar pleural effusions dependently.  Underlying COPD.  No pneumothorax.  Bones demineralized.  IMPRESSION: Increased bibasilar infiltrates consistent with pneumonia.  Underlying COPD and bibasilar effusions.   Electronically Signed   By: Lavonia Dana M.D.   On: 01/21/2013 10:57   Ct Abdomen Pelvis W Contrast  01/17/2013   CLINICAL DATA:  Abdominal pain with emesis this morning. History of megacolon. Mild leukocytosis.  EXAM: CT ABDOMEN AND PELVIS WITH CONTRAST  TECHNIQUE: Multidetector CT imaging of the abdomen and pelvis was performed using the standard protocol following bolus administration of intravenous contrast.  CONTRAST:  7mL OMNIPAQUE IOHEXOL 300 MG/ML SOLN, 29mL OMNIPAQUE IOHEXOL 300 MG/ML SOLN  COMPARISON:  Ultrasound 04/25/2010.  CT 01/01/2010.  FINDINGS: On initial scanning, the IV tubing leaked resulting in imaging prior to contrast administration. After securing the IV line, contrast was reinjected and the scan was repeated. Patient was not charged for the noncontrast portion of this study.  There is new patchy airspace disease in the right middle lobe. Patchy left lower lobe airspace disease is similar to the prior examination. There is mild underlying emphysema. No significant pleural or pericardial effusion is present.  The liver, gallbladder and biliary system appear unremarkable. There is chronic fatty replacement of the pancreas without focal abnormality. The spleen, adrenal glands and kidneys appear unremarkable.   There is moderate dilatation of the stomach and proximal to mid small bowel. The distal small bowel is decompressed. Area of transition appears to be in the right false pelvis where there appears to be some twirling of the mesentery on the sagittal images, suggesting a possible internal hernia. No bowel wall hernias are identified. No surrounding mass is identified. There is no small bowel wall thickening, extraluminal fluid collection or pneumatosis.  The colon is decompressed. There is sigmoid colon atherosclerosis without surrounding inflammatory change. The uterus, ovaries and bladder appear normal.  There is diffuse aortoiliac atherosclerosis. IVC filter is again noted with penetration of the limbs beyond on the walls. There is no retroperitoneal hematoma or lymphadenopathy.  Thoracolumbar scoliosis and spondylosis are stable. There is stable chronic right femoral head avascular necrosis without subchondral collapse.  IMPRESSION: 1. Distal small bowel obstruction with apparent area of transition in the right false  pelvis. There is a possible internal hernia in this region. No bowel wall hernia identified. 2. No evidence of perforation or abscess. 3. Sigmoid colon diverticulosis. 4. Right middle and left lower lobe airspace disease suspicious for pneumonia.   Electronically Signed   By: Camie Patience M.D.   On: 01/17/2013 09:09   Dg Chest Port 1 View  01/17/2013   CLINICAL DATA:  CT scan revealed abnormal opacities at the lung bases.  EXAM: PORTABLE CHEST - 1 VIEW  COMPARISON:  01/17/2013; 10/13/2012  FINDINGS: Hazy airspace opacities are present in the right middle lobe, left lower lobe, and possibly the lingula. These are new compared to 10/13/12.  Heart size within normal limits.  Nasogastric tube tip:  Stomach.  IMPRESSION: 1. Right middle lobe, left lower lobe, and possibly lingular airspace opacities favoring multi lobar pneumonia.   Electronically Signed   By: Sherryl Barters M.D.   On: 01/17/2013 11:49    Dg Abd 2 Views  01/21/2013   CLINICAL DATA:  Abdominal distention, small bowel obstruction  EXAM: ABDOMEN - 2 VIEW  COMPARISON:  01/18/2013  FINDINGS: Tip of nasogastric tube projects over gastric antrum.  IVC filter present.  Few mildly distended loops of small bowel are again seen in the abdomen.  Small amount of contrast and gas within colon.  No definite bowel wall thickening or free intraperitoneal air.  Osseous demineralization with degenerative changes and scoliosis of the lumbar spine.  IMPRESSION: Few persistent mildly dilated small bowel loops.   Electronically Signed   By: Lavonia Dana M.D.   On: 01/21/2013 08:13   Dg Abd Portable 1v  01/18/2013   CLINICAL DATA:  Small bowel obstruction  EXAM: PORTABLE ABDOMEN - 1 VIEW  COMPARISON:  Study obtained earlier in the day  FINDINGS: Nasogastric tube tip is in the proximal stomach with the side port at the gastroesophageal junction. There is less bowel dilatation compared to earlier in the day. There is contrast in the small bowel and cecum. Inferior vena cava filter position unchanged. There is scoliosis and degenerative change in the lumbar spine. No free air is seen on this supine examination.  IMPRESSION: Nasogastric tube tip in proximal stomach with side port at the gastroesophageal junction. Advise advancing nasogastric tube 6 to 8 cm.  Less bowel dilatation compared to earlier in the day. Contrast is seen in the cecum.  No free air seen on this supine examination.  Stable lumbar arthropathy.   Electronically Signed   By: Lowella Grip M.D.   On: 01/18/2013 23:03   Dg Abd Portable 1v  01/18/2013   CLINICAL DATA:  Distended abdomen with abdominal pain.  EXAM: PORTABLE ABDOMEN - 1 VIEW  COMPARISON:  01/17/2013.  FINDINGS: The small bowel obstruction pattern persists, with maximal diameter of small bowel loops in the central abdomen measuring 43 mm. No gross plain film evidence of free air on this supine radiograph. IVC filter again noted. Stool  and bowel gas remains present within the rectosigmoid. Bilateral femoral head AVN is incidentally noted.  IMPRESSION: Persistent small bowel obstruction. Maximal diameter of small bowel is 43 mm.   Electronically Signed   By: Dereck Ligas M.D.   On: 01/18/2013 07:59    ASSESSMENT AND PLAN 1. paroxysmal atrial flutter, resolved.  Continue amiodarone.  Continue Coumadin.  2. dyspnea secondary to fluid overload with elevated proBNP. 2-D echo done yesterday with results pending.  She still has some volume overload.  Will continue Lasix 3. Pneumonitis  4. recent  small bowel obstruction resolved   Signed, Darlin Coco MD

## 2013-01-24 NOTE — Progress Notes (Signed)
Patient ID: [name], female   DOB: [birth year], 80 y.o.   MRN: [id number]  [facility] PROGRESS NOTE  [name] [id number] DOB: 1920/08/07 DOA: 01/17/2013 PCP: [name], MD  Brief narrative: 80 y.o. year-old female with history of PAF, DVT and PE on A/C and s/p IVC filter, microscopic colitis and hx of self-resolved SBO, temporal arteritis no longer on prednisone, hypothyroidism, blindness, spinal stenosis who presented to [facility] ED with sharp and constant LUQ abdominal pain that started several days PTA, associated with non bloody diarrhea and bilious vomiting.   In the ED, WBC 13.1, BUN 45 Cr 1.36, INR 2.64. CT scan of the abdomen and pelvis demonstrated a distal SBO with no perforation or abscess. Incidentally, right middle and left lower lobe airspace disease was noted and suspicious for PNA. General surgery has been consultative.   Principal Problem:  SBO (small bowel obstruction)  - pt reports feeling better this AM, pt tolerating soft solids  - appreciate surgery and GI assistance  - IVF discontinued 01/08 given more congestion on exam  Active Problems:  Atrial fibrillation  - pt has spontaneously converted to NSR  - amiodarone restarted and Metoprolol added as well as needed  - cardiology team consulted and recommendations appreciated  - more congestion noted on CXR, continue Lasix - 2 D ECHO pending  Acute respiratory failure secondary to CHF, diastolic and PNA - oxygen saturation remains at target range  - repeat CXR 01/10 with improving PNA but persistent bilateral pleural effusions - ABX changed to Vancomycin and Levaquin and today is the day #4/7  - sputum analysis, culture and gram stain negative to date  - continue Lasix and follow up on 2 D ECHO  Pulmonary embolus  - Coumadin as per pharmacy  Nausea and vomiting  - secondary to SBO, opt clinically improving and tolerating clear liquid diet  - advance diet as tolerating  - continue to provide  supportive care with analgesia as needed  Leukocytosis  - secondary to PNA and SBO  - WBC trending down and WNL this AM  CKD (chronic kidney disease), stage IV  - remains stable and at baseline, WNL this AM  - BMP in AM  Hypokalemia  - secondary to vomiting and diarrhea  - supplement as indicated  - BMP in AM   Consultants:  GI  Surgery  Cardiology  Procedures/Studies:  Dg Abd Portable 1v 01/18/2013 Nasogastric tube tip in proximal stomach with side port at the gastroesophageal junction. Advise advancing nasogastric tube 6 to 8 cm. Less bowel dilatation compared to earlier in the day. Contrast is seen in the cecum. No free air seen on this supine examination.  Dg Chest 2 View 01/21/2013 Increased bibasilar infiltrates consistent with pneumonia. Underlying COPD and bibasilar effusions.  Dg Abd 2 Views 01/21/2013 Few persistent mildly dilated small bowel loops.  Dg Chest 2 View  01/23/2013 Worsening bilateral pleural effusions are noted with right greater than left. However, bilateral perihilar and basilar opacities are improved compared to prior exam.    Antibiotics:  Flagyl 01/04 --> 01/07  Zithromax 01/04 --> 01/07  Levaquin 01/07 -->  Vancomycin 01/07 -->  Code Status: DNR  Family Communication: Pt and daughter at bedside  Disposition Plan: Home when medically stable   HPI/Subjective: No events overnight.   Objective: Filed Vitals:   01/23/13 0654 01/23/13 1400 01/23/13 2312 01/24/13 0703  BP: 155/57 141/52 128/51 143/63  Pulse: 75 75 73 77  Temp: 97.6 F (36.4 C) 97.5  F (36.4 C) 98.4 F (36.9 C) 97.5 F (36.4 C)  TempSrc: Oral Oral Oral Oral  Resp: 16 16 20 18   Height:      Weight: 63.1 kg (139 lb 1.8 oz)   60.873 kg (134 lb 3.2 oz)  SpO2: 95% 100% 96% 95%    Intake/Output Summary (Last 24 hours) at 01/24/13 0843 Last data filed at 01/24/13 0704  Gross per 24 hour  Intake    710 ml  Output   1925 ml  Net  -1215 ml    Exam:   General:  Pt is alert, follows  commands appropriately, not in acute distress  Cardiovascular: Regular rate and rhythm, S1/S2, no murmurs, no rubs, no gallops  Respiratory: Clear to auscultation bilaterally, no wheezing, no crackles, no rhonchi  Abdomen: Soft, non tender, non distended, bowel sounds present, no guarding  Extremities: No edema, pulses DP and PT palpable bilaterally  Neuro: Grossly nonfocal  Data Reviewed: Basic Metabolic Panel:  Recent Labs Lab 01/20/13 0604 01/21/13 0537 01/22/13 0504 01/23/13 0430 01/24/13 0517  NA 142 142 138 138 139  K 3.5* 3.7 3.9 3.6* 3.8  CL 107 107 103 103 105  CO2 19 20 21 23 25   GLUCOSE 84 110* 114* 97 99  BUN 19 16 14 17 13   CREATININE 0.93 0.83 0.81 1.03 0.96  CALCIUM 8.3* 8.1* 8.2* 8.0* 7.9*   Liver Function Tests:  Recent Labs Lab 01/18/13 2230  AST 29  ALT 31  ALKPHOS 66  BILITOT 0.7  PROT 5.7*  ALBUMIN 3.0*   CBC:  Recent Labs Lab 01/20/13 0604 01/21/13 0537 01/22/13 0504 01/23/13 0430 01/24/13 0517  WBC 9.7 8.9 10.1 9.3 8.5  HGB 10.9* 10.3* 11.2* 10.6* 10.5*  HCT 34.5* 32.1* 33.1* 32.5* 31.7*  MCV 94.5 93.0 89.5 91.5 90.8  PLT 199 212 240 215 238   Cardiac Enzymes:  Recent Labs Lab 01/17/13 1247 01/17/13 1834 01/18/13 0054  TROPONINI <0.30 <0.30 <0.30     Recent Results (from the past 240 hour(s))  CULTURE, EXPECTORATED SPUTUM-ASSESSMENT     Status: None   Collection Time    01/21/13  4:50 PM      Result Value Range Status   Specimen Description SPUTUM   Final   Special Requests NONE   Final   Sputum evaluation     Final   Value: MICROSCOPIC FINDINGS SUGGEST THAT THIS SPECIMEN IS NOT REPRESENTATIVE OF LOWER RESPIRATORY SECRETIONS. PLEASE RECOLLECT.     Rhodia Albright RN [id number] 01/21/13 A NAVARRO   Report Status 01/21/2013 FINAL   Final  CULTURE, EXPECTORATED SPUTUM-ASSESSMENT     Status: None   Collection Time    01/22/13  2:16 AM      Result Value Range Status   Specimen Description SPUTUM   Final   Special Requests  Normal   Final   Sputum evaluation     Final   Value: THIS SPECIMEN IS ACCEPTABLE. RESPIRATORY CULTURE REPORT TO FOLLOW.   Report Status 01/22/2013 FINAL   Final  CULTURE, RESPIRATORY (NON-EXPECTORATED)     Status: None   Collection Time    01/22/13  2:16 AM      Result Value Range Status   Specimen Description SPUTUM   Final   Special Requests NONE   Final   Gram Stain     Final   Value: NO WBC SEEN     RARE SQUAMOUS EPITHELIAL CELLS PRESENT     NO ORGANISMS SEEN  Performed at [facility]     Final   Value: NO GROWTH 2 DAYS     Performed at [facility]   Report Status 01/24/2013 FINAL   Final     Scheduled Meds: . amiodarone  100 mg Oral BID  . antiseptic oral rinse  15 mL Mouth Rinse q12n4p  . chlorhexidine  15 mL Mouth Rinse BID  . levofloxacin (LEVAQUIN) IV  750 mg Intravenous Q48H  . levothyroxine  75 mcg Intravenous Daily  . mesalamine  4.8 g Oral Q breakfast  . pantoprazole (PROTONIX) IV  40 mg Intravenous Q12H  . potassium chloride  10 mEq Oral TID  . sodium chloride  3 mL Intravenous Q12H  . vancomycin  500 mg Intravenous Q12H   Continuous Infusions:   Faye Ramsay, MD  [facility] Pager [phone]  If 7PM-7AM, please contact night-coverage [email or web address] Password [email or web address] 01/24/2013, 8:43 AM   LOS: 7 days

## 2013-01-24 NOTE — Progress Notes (Signed)
Pharmacy: Brief Abx Note: Vancomycin  80 yo F with Vancomycin trough 12.7 (goal 15-20) On D#4 Vancomycin 500 mg IV Q12h for PNA.  Renal function remains stable with CrCl ~ 37 ml/min.  Will increase dose just slightly given age of 80 yo, and monitor renal function and vancomycin trough closely.   Plan 1.) Increase Vancomycin to 750 mg IV q12h  2.) Monitor BMET 3.) Repeat vancomycin trough at [facility]  Ceola Para, Gaye Alken PharmD Pager #: [phone] 6:09 PM 01/24/2013

## 2013-01-25 DIAGNOSIS — M7989 Other specified soft tissue disorders: Secondary | ICD-10-CM

## 2013-01-25 DIAGNOSIS — M79609 Pain in unspecified limb: Secondary | ICD-10-CM

## 2013-01-25 LAB — BASIC METABOLIC PANEL
BUN: 10 mg/dL (ref 6–23)
CO2: 24 mEq/L (ref 19–32)
CREATININE: 0.95 mg/dL (ref 0.50–1.10)
Calcium: 8.2 mg/dL — ABNORMAL LOW (ref 8.4–10.5)
Chloride: 101 mEq/L (ref 96–112)
GFR calc Af Amer: 59 mL/min — ABNORMAL LOW (ref >90)
GFR, EST NON AFRICAN AMERICAN: 51 mL/min — AB (ref >90)
Glucose, Bld: 101 mg/dL — ABNORMAL HIGH (ref 70–99)
POTASSIUM: 3.6 meq/L — AB (ref 3.7–5.3)
Sodium: 138 mEq/L (ref 137–147)

## 2013-01-25 LAB — CBC
HCT: 32.3 % — ABNORMAL LOW (ref 36.0–46.0)
HEMOGLOBIN: 10.6 g/dL — AB (ref 12.0–15.0)
MCH: 29.7 pg (ref 26.0–34.0)
MCHC: 32.8 g/dL (ref 30.0–36.0)
MCV: 90.5 fL (ref 78.0–100.0)
Platelets: 248 10*3/uL (ref 150–400)
RBC: 3.57 MIL/uL — ABNORMAL LOW (ref 3.87–5.11)
RDW: 14.6 % (ref 11.5–15.5)
WBC: 7.8 10*3/uL (ref 4.0–10.5)

## 2013-01-25 LAB — PROTIME-INR
INR: 2.27 — AB (ref 0.00–1.49)
PROTHROMBIN TIME: 24.3 s — AB (ref 11.6–15.2)

## 2013-01-25 MED ORDER — LEVOFLOXACIN 500 MG PO TABS
500.0000 mg | ORAL_TABLET | Freq: Every day | ORAL | Status: DC
Start: 2013-01-25 — End: 2013-01-25
  Filled 2013-01-25: qty 1

## 2013-01-25 MED ORDER — WARFARIN - PHARMACIST DOSING INPATIENT: Freq: Every day | Status: DC

## 2013-01-25 MED ORDER — SACCHAROMYCES BOULARDII 250 MG PO CAPS
250.0000 mg | ORAL_CAPSULE | Freq: Two times a day (BID) | ORAL | Status: DC
  Administered 2013-01-25 – 2013-01-27 (×5): 250 mg via ORAL
  Filled 2013-01-25 (×6): qty 1

## 2013-01-25 MED ORDER — PANTOPRAZOLE SODIUM 40 MG PO TBEC
40.0000 mg | DELAYED_RELEASE_TABLET | Freq: Every day | ORAL | Status: DC
  Administered 2013-01-25 – 2013-01-27 (×3): 40 mg via ORAL
  Filled 2013-01-25 (×3): qty 1

## 2013-01-25 MED ORDER — LEVOFLOXACIN 750 MG PO TABS: 750.0000 mg | ORAL_TABLET | ORAL | Status: DC

## 2013-01-25 MED ORDER — POTASSIUM CHLORIDE CRYS ER 20 MEQ PO TBCR
40.0000 meq | EXTENDED_RELEASE_TABLET | Freq: Once | ORAL | Status: AC
  Administered 2013-01-25: 17:00:00 40 meq via ORAL
  Filled 2013-01-25: qty 2

## 2013-01-25 MED ORDER — WARFARIN SODIUM 2 MG PO TABS
2.0000 mg | ORAL_TABLET | Freq: Once | ORAL | Status: AC
  Administered 2013-01-25: 2 mg via ORAL
  Filled 2013-01-25: qty 1

## 2013-01-25 MED ORDER — LEVOFLOXACIN 750 MG PO TABS
750.0000 mg | ORAL_TABLET | ORAL | Status: DC
  Administered 2013-01-25: 17:00:00 750 mg via ORAL
  Filled 2013-01-25: qty 1

## 2013-01-25 MED ORDER — ONDANSETRON HCL 4 MG PO TABS: 4.0000 mg | ORAL_TABLET | ORAL | Status: DC | PRN

## 2013-01-25 MED ORDER — LEVOTHYROXINE SODIUM 75 MCG PO TABS
75.0000 ug | ORAL_TABLET | Freq: Every day | ORAL | Status: DC
  Administered 2013-01-26: 09:00:00 75 ug via ORAL
  Filled 2013-01-25 (×2): qty 1

## 2013-01-25 NOTE — Progress Notes (Signed)
ANTIBIOTIC CONSULT NOTE - FOLLOW UP  Pharmacy Consult for Renal adjustment of antibiotics Indication: pneumonia  Allergies  Allergen Reactions  . Cefdinir     Caused bad diarrhea  . Cortisone     Flushed hands and face   . Prednisone     cuased blood clots  . Sulfonamide Derivatives     Stomach pains and cramping     Patient Measurements: Height: 5\' 6"  (167.6 cm) Weight: 132 lb 3.2 oz (59.966 kg) IBW/kg (Calculated) : 59.3  Vital Signs: Temp: 97.7 F (36.5 C) (01/12 0430) Temp src: Oral (01/12 0430) BP: 147/65 mmHg (01/12 0430) Pulse Rate: 78 (01/12 0430) Intake/Output from previous day: 01/11 0701 - 01/12 0700 In: 480 [P.O.:480] Out: 2325 [Urine:2325] Intake/Output from this shift: Total I/O In: 240 [P.O.:240] Out: 300 [Urine:300]  Labs:  Recent Labs  01/23/13 0430 01/24/13 0517 01/25/13 0533  WBC 9.3 8.5 7.8  HGB 10.6* 10.5* 10.6*  PLT 215 238 248  CREATININE 1.03 0.96 0.95   Estimated Creatinine Clearance: 36.8 ml/min (by C-G formula based on Cr of 0.95).  Recent Labs  01/24/13 1630  Ali Chukson 12.7     Microbiology: 1/9 Sputum: no growth to date  Anti-infectives: 1/4 >> Azithromycin >> 1/7 1/4 >> Metronidazole >> 1/8 1/5 >> Penicillin G >> 1/8 1/8 >> Levaquin >> 1/8 >> Vancomycin >> 1/12  Assessment: 24 yoF with PMHx PAF, DVT, PE on AC and s/p IVCF, microscopic colitis, and CDiff admitted to [facility] on 1/4 with abdominal pain and nausea, found to have SBO and PNA.  Pt has been treated for aspiration PNA and possible gastroenteritis with regimen to cover CDiff empirically initially, then changed to levaquin (per MD) and vancomycin per pharmacy for worsening multilobar PNA.   Day #5/7 antibiotics, currently levaquin only as vancomycin dc'd today.  Repeat CXR 01/10 with improving PNA but persistent bilateral pleural effusions.  Tm24h: Afebrile  Renal: CKD-IV. SCr 0.95, CrCl~36 ml/min  WBC WNL  Recommend continued monitoring of QTc  while on Levaquin and Amiodarone.  QTc 1/11 = 450 ms   Goal of Therapy:  Eradication of infection Appropriate dose for renal function  Plan:   Levaquin 750mg  PO q48h x 2 more doses (1/12 and 1/14) to complete 7 days total as originally ordered on admit Follow up renal function & cultures  Peggyann Juba, PharmD, BCPS Pager: [phone] 01/25/2013,2:28 PM

## 2013-01-25 NOTE — Progress Notes (Signed)
*  Preliminary Results* Right upper extremity venous duplex completed. Right upper extremity is negative for deep and superficial vein thrombosis.  01/25/2013 3:25 PM  Maudry Mayhew, RVT, RDCS, RDMS

## 2013-01-25 NOTE — Progress Notes (Signed)
SUBJECTIVE:  No palpitations  OBJECTIVE:   Vitals:   Filed Vitals:   01/24/13 0703 01/24/13 1402 01/24/13 2045 01/25/13 0430  BP: 143/63 122/57 155/66 147/65  Pulse: 77 72 73 78  Temp: 97.5 F (36.4 C) 97.9 F (36.6 C) 97.9 F (36.6 C) 97.7 F (36.5 C)  TempSrc: Oral Oral Oral Oral  Resp: 18 18 18 18   Height:      Weight: 134 lb 3.2 oz (60.873 kg)   132 lb 3.2 oz (59.966 kg)  SpO2: 95% 99% 95% 97%   I&O's:   Intake/Output Summary (Last 24 hours) at 01/25/13 1223 Last data filed at 01/25/13 0900  Gross per 24 hour  Intake    600 ml  Output   2400 ml  Net  -1800 ml   TELEMETRY: Reviewed telemetry pt in NSR:     PHYSICAL EXAM General: Well developed, well nourished, in no acute distress Head:   Normal cephalic and atramatic  Lungs:   Clear bilaterally to auscultation and percussion. Heart:   HRRR S1 S2 , No JVD.   Abdomen: abdomen soft and non-tender  Msk:  Back normal, normal gait. Normal strength and tone for age. Extremities:  No edema.  DP +1 Neuro: Alert and oriented X 3. Psych:  Normal affect, responds appropriately   LABS: Basic Metabolic Panel:  Recent Labs  01/24/13 0517 01/25/13 0533  NA 139 138  K 3.8 3.6*  CL 105 101  CO2 25 24  GLUCOSE 99 101*  BUN 13 10  CREATININE 0.96 0.95  CALCIUM 7.9* 8.2*   Liver Function Tests: No results found for this basename: AST, ALT, ALKPHOS, BILITOT, PROT, ALBUMIN,  in the last 72 hours No results found for this basename: LIPASE, AMYLASE,  in the last 72 hours CBC:  Recent Labs  01/24/13 0517 01/25/13 0533  WBC 8.5 7.8  HGB 10.5* 10.6*  HCT 31.7* 32.3*  MCV 90.8 90.5  PLT 238 248   Cardiac Enzymes: No results found for this basename: CKTOTAL, CKMB, CKMBINDEX, TROPONINI,  in the last 72 hours BNP: No components found with this basename: POCBNP,  D-Dimer: No results found for this basename: DDIMER,  in the last 72 hours Hemoglobin A1C: No results found for this basename: HGBA1C,  in the last 72  hours Fasting Lipid Panel: No results found for this basename: CHOL, HDL, LDLCALC, TRIG, CHOLHDL, LDLDIRECT,  in the last 72 hours Thyroid Function Tests: No results found for this basename: TSH, T4TOTAL, FREET3, T3FREE, THYROIDAB,  in the last 72 hours Anemia Panel: No results found for this basename: VITAMINB12, FOLATE, FERRITIN, TIBC, IRON, RETICCTPCT,  in the last 72 hours Coag Panel:   Lab Results  Component Value Date   INR 2.27* 01/25/2013   INR 2.91* 01/24/2013   INR 3.20* 01/23/2013    RADIOLOGY: Dg Chest 2 View  01/23/2013   CLINICAL DATA:  Shortness of breath, weakness.  EXAM: CHEST  2 VIEW  COMPARISON:  January 21, 2013.  FINDINGS: Stable cardiomediastinal silhouette. Mild to moderate bilateral pleural effusions are noted which are increased compared to prior exam. No pneumothorax is noted. Bilateral perihilar and basilar opacities are noted which are improved compared to prior exam.  IMPRESSION: Worsening bilateral pleural effusions are noted with right greater than left. However, bilateral perihilar and basilar opacities are improved compared to prior exam.   Electronically Signed   By: Sabino Dick M.D.   On: 01/23/2013 18:34   Dg Chest 2 View  01/21/2013  CLINICAL DATA:  Decreased oxygen saturation  EXAM: CHEST  2 VIEW  COMPARISON:  01/17/2013  FINDINGS: Minimal enlargement of cardiac silhouette.  Pulmonary vascular congestion.  Bibasilar opacities consistent with pneumonia increased since previous exam.  Bibasilar pleural effusions dependently.  Underlying COPD.  No pneumothorax.  Bones demineralized.  IMPRESSION: Increased bibasilar infiltrates consistent with pneumonia.  Underlying COPD and bibasilar effusions.   Electronically Signed   By: Lavonia Dana M.D.   On: 01/21/2013 10:57   Ct Abdomen Pelvis W Contrast  01/17/2013   CLINICAL DATA:  Abdominal pain with emesis this morning. History of megacolon. Mild leukocytosis.  EXAM: CT ABDOMEN AND PELVIS WITH CONTRAST  TECHNIQUE:  Multidetector CT imaging of the abdomen and pelvis was performed using the standard protocol following bolus administration of intravenous contrast.  CONTRAST:  33mL OMNIPAQUE IOHEXOL 300 MG/ML SOLN, 60mL OMNIPAQUE IOHEXOL 300 MG/ML SOLN  COMPARISON:  Ultrasound 04/25/2010.  CT 01/01/2010.  FINDINGS: On initial scanning, the IV tubing leaked resulting in imaging prior to contrast administration. After securing the IV line, contrast was reinjected and the scan was repeated. Patient was not charged for the noncontrast portion of this study.  There is new patchy airspace disease in the right middle lobe. Patchy left lower lobe airspace disease is similar to the prior examination. There is mild underlying emphysema. No significant pleural or pericardial effusion is present.  The liver, gallbladder and biliary system appear unremarkable. There is chronic fatty replacement of the pancreas without focal abnormality. The spleen, adrenal glands and kidneys appear unremarkable.  There is moderate dilatation of the stomach and proximal to mid small bowel. The distal small bowel is decompressed. Area of transition appears to be in the right false pelvis where there appears to be some twirling of the mesentery on the sagittal images, suggesting a possible internal hernia. No bowel wall hernias are identified. No surrounding mass is identified. There is no small bowel wall thickening, extraluminal fluid collection or pneumatosis.  The colon is decompressed. There is sigmoid colon atherosclerosis without surrounding inflammatory change. The uterus, ovaries and bladder appear normal.  There is diffuse aortoiliac atherosclerosis. IVC filter is again noted with penetration of the limbs beyond on the walls. There is no retroperitoneal hematoma or lymphadenopathy.  Thoracolumbar scoliosis and spondylosis are stable. There is stable chronic right femoral head avascular necrosis without subchondral collapse.  IMPRESSION: 1. Distal small  bowel obstruction with apparent area of transition in the right false pelvis. There is a possible internal hernia in this region. No bowel wall hernia identified. 2. No evidence of perforation or abscess. 3. Sigmoid colon diverticulosis. 4. Right middle and left lower lobe airspace disease suspicious for pneumonia.   Electronically Signed   By: Camie Patience M.D.   On: 01/17/2013 09:09   Dg Chest Port 1 View  01/17/2013   CLINICAL DATA:  CT scan revealed abnormal opacities at the lung bases.  EXAM: PORTABLE CHEST - 1 VIEW  COMPARISON:  01/17/2013; 10/13/2012  FINDINGS: Hazy airspace opacities are present in the right middle lobe, left lower lobe, and possibly the lingula. These are new compared to 10/13/12.  Heart size within normal limits.  Nasogastric tube tip:  Stomach.  IMPRESSION: 1. Right middle lobe, left lower lobe, and possibly lingular airspace opacities favoring multi lobar pneumonia.   Electronically Signed   By: Sherryl Barters M.D.   On: 01/17/2013 11:49   Dg Abd 2 Views  01/21/2013   CLINICAL DATA:  Abdominal distention, small bowel obstruction  EXAM: ABDOMEN - 2 VIEW  COMPARISON:  01/18/2013  FINDINGS: Tip of nasogastric tube projects over gastric antrum.  IVC filter present.  Few mildly distended loops of small bowel are again seen in the abdomen.  Small amount of contrast and gas within colon.  No definite bowel wall thickening or free intraperitoneal air.  Osseous demineralization with degenerative changes and scoliosis of the lumbar spine.  IMPRESSION: Few persistent mildly dilated small bowel loops.   Electronically Signed   By: Lavonia Dana M.D.   On: 01/21/2013 08:13   Dg Abd Portable 1v  01/18/2013   CLINICAL DATA:  Small bowel obstruction  EXAM: PORTABLE ABDOMEN - 1 VIEW  COMPARISON:  Study obtained earlier in the day  FINDINGS: Nasogastric tube tip is in the proximal stomach with the side port at the gastroesophageal junction. There is less bowel dilatation compared to earlier in the  day. There is contrast in the small bowel and cecum. Inferior vena cava filter position unchanged. There is scoliosis and degenerative change in the lumbar spine. No free air is seen on this supine examination.  IMPRESSION: Nasogastric tube tip in proximal stomach with side port at the gastroesophageal junction. Advise advancing nasogastric tube 6 to 8 cm.  Less bowel dilatation compared to earlier in the day. Contrast is seen in the cecum.  No free air seen on this supine examination.  Stable lumbar arthropathy.   Electronically Signed   By: Lowella Grip M.D.   On: 01/18/2013 23:03   Dg Abd Portable 1v  01/18/2013   CLINICAL DATA:  Distended abdomen with abdominal pain.  EXAM: PORTABLE ABDOMEN - 1 VIEW  COMPARISON:  01/17/2013.  FINDINGS: The small bowel obstruction pattern persists, with maximal diameter of small bowel loops in the central abdomen measuring 43 mm. No gross plain film evidence of free air on this supine radiograph. IVC filter again noted. Stool and bowel gas remains present within the rectosigmoid. Bilateral femoral head AVN is incidentally noted.  IMPRESSION: Persistent small bowel obstruction. Maximal diameter of small bowel is 43 mm.   Electronically Signed   By: Dereck Ligas M.D.   On: 01/18/2013 07:59      ASSESSMENT/PLAN:   AFib:  Maintaining NSR.  Diuresing well.  Normal EF.  Continue amiodarone, metoprolol.    Diastolic heart failure is improved.  No oxygen requirement.    Coumadin for stroke prevention.  Therapeutic.  Jettie Booze., MD  01/25/2013  12:23 PM

## 2013-01-25 NOTE — Progress Notes (Signed)
Physical Therapy Treatment Patient Details Name: [name] MRN: [id number] DOB: 1920-05-17 Today's Date: 01/25/2013 Time: [id number] PT Time Calculation (min): 23 min  PT Assessment / Plan / Recommendation  History of Present Illness Pt is a 80 year old female who presented to the emergency department this morning after developing abdominal pain nausea and vomiting overnight. Her family is present in the room. They report multiple episodes of emesis. She described pain as cramping and the feeling like there was a watermelon on her stomach. The pain started centrally and in the upper abdomen. There is no hematemesis. She had a bowel movement last evening but has not passed flatus since. She had a similar episode in 2011 which resolved without the need for surgery or even an NG tube. Currently, she is more comfortable since a nasogastric tube is in place. She is otherwise without complaints. She denies shortness of breath or chest pain. There have been no fevers or chills.   PT Comments   Progressing well with mobility. Possible d/c home tomorrow.   Follow Up Recommendations  No PT follow up     Does the patient have the potential to tolerate intense rehabilitation     Barriers to Discharge        Equipment Recommendations  None recommended by PT    Recommendations for Other Services    Frequency Min 3X/week   Progress towards PT Goals Progress towards PT goals: Progressing toward goals  Plan Current plan remains appropriate    Precautions / Restrictions Precautions Precautions: Fall Restrictions Weight Bearing Restrictions: No   Pertinent Vitals/Pain No c/o pain    Mobility  Bed Mobility Bed Mobility: Sit to Supine Sit to supine: Modified independent (Device/Increase time) Transfers Overall transfer level: Needs assistance Transfers: Sit to/from Stand Sit to Stand: Supervision General transfer comment: VCs hand placement Ambulation/Gait Ambulation/Gait assistance:  Modified independent (Device/Increase time) Ambulation Distance (Feet): 600 Feet Assistive device: 4-wheeled walker Stairs: Yes Stairs assistance: Supervision Stair Management: One rail Left;Alternating pattern Number of Stairs: 3 General stair comments: Increased time. Pt reports she has some difficulty with depth perception so descending is a little more difficult. Cautioned pt against using 4 wheeled walker when ascending/descending steps. However, pt states she doesn't have any other way to get walker up and down steps to go for daily walks.     Exercises     PT Diagnosis:    PT Problem List:   PT Treatment Interventions:     PT Goals (current goals can now be found in the care plan section)    Visit Information  Last PT Received On: 01/25/13 Assistance Needed: +1 History of Present Illness: Pt is a 80 year old female who presented to the emergency department this morning after developing abdominal pain nausea and vomiting overnight. Her family is present in the room. They report multiple episodes of emesis. She described pain as cramping and the feeling like there was a watermelon on her stomach. The pain started centrally and in the upper abdomen. There is no hematemesis. She had a bowel movement last evening but has not passed flatus since. She had a similar episode in 2011 which resolved without the need for surgery or even an NG tube. Currently, she is more comfortable since a nasogastric tube is in place. She is otherwise without complaints. She denies shortness of breath or chest pain. There have been no fevers or chills.    Subjective Data      Cognition  Cognition Arousal/Alertness:  Awake/alert Behavior During Therapy: WFL for tasks assessed/performed Overall Cognitive Status: Within Functional Limits for tasks assessed    Balance     End of Session PT - End of Session Activity Tolerance: Patient tolerated treatment well Patient left: in bed;with call bell/phone  within reach;with family/visitor present   GP     Weston Anna, MPT Pager: [phone]

## 2013-01-25 NOTE — Progress Notes (Signed)
Very poor venous access. Family reports "8 IV's" since admission and bilateral extremity bruising and pain. Pt is on vancomycin and daily lab draws, recommend PICC. Lorri Frederick, RN IV Team

## 2013-01-25 NOTE — Progress Notes (Addendum)
ANTICOAGULATION CONSULT NOTE - Follow Up Consult  Pharmacy Consult for Coumadin/Lovenox if/when INR < 2 Indication: atrial fibrillation, h/o PE/DVT  Allergies  Allergen Reactions  . Cefdinir     Caused bad diarrhea  . Cortisone     Flushed hands and face   . Prednisone     cuased blood clots  . Sulfonamide Derivatives     Stomach pains and cramping     Labs:  Recent Labs  01/23/13 0430 01/24/13 0517 01/25/13 0533  HGB 10.6* 10.5* 10.6*  HCT 32.5* 31.7* 32.3*  PLT 215 238 248  LABPROT 31.6* 29.4* 24.3*  INR 3.20* 2.91* 2.27*  CREATININE 1.03 0.96 0.95    Estimated Creatinine Clearance: 36.8 ml/min (by C-G formula based on Cr of 0.95).   Assessment: 72 yoF with hx PAF, DVT, and PE on warfarin PTA and s/p IVC filter. Pt presented with abdominal pain and nausea. INR therapeutic on admit. CT abd/pelvis showed distal SBO, possible internal hernia, no perforation or abscess. Surgery on board, coumadin held and pharmacy received order to start Lovenox if INR < 2 on 1/7.  On 1/9, pharmacy received order to resume Coumadin. Pt reported was on Coumadin 2.5mg  daily except 1.25mg  MWF prior to admission.   INR originally decreased but trended up to supratherapeutic on 1/8 and today down to 2.27.   Hgb low but stable. No bleeding.   Significant drug-drug interaction noted with new start amiodarone and Levaquin. Concurrent amiodarone and warfarin can significantly increase the risk of bleeding/elevated INR but the onset of interaction is relatively (slow).  In the long-run, Coumadin dose will need to be lowered.   Goal of Therapy:  INR 2-3 Monitor platelets by anticoagulation protocol: Yes   Plan:   Resume Coumadin tonight at 2mg    Pharmacy will bridge with Lovenox if INR < 2 while on Coumadin unless MD specifically remove Lovenox consult.   Daily PT/INR  Vanessa [place], PharmD, BCPS Pager: [phone] 12:55 PM Pharmacy #: [phone]

## 2013-01-25 NOTE — Progress Notes (Signed)
Patient ID: [name], female   DOB: [birth year], 80 y.o.   MRN: [id number]  [facility] PROGRESS NOTE  KIMORAH SHILLING [id number] DOB: 10-01-1920 DOA: 01/17/2013 PCP: [name], MD   Brief narrative:  80 y.o. year-old female with history of PAF, DVT and PE on A/C and s/p IVC filter, microscopic colitis and hx of self-resolved SBO, temporal arteritis no longer on prednisone, hypothyroidism, blindness, spinal stenosis who presented to [facility] ED with sharp and constant LUQ abdominal pain that started several days PTA, associated with non bloody diarrhea and bilious vomiting.   In the ED, WBC 13.1, BUN 45 Cr 1.36, INR 2.64. CT scan of the abdomen and pelvis demonstrated a distal SBO with no perforation or abscess. Incidentally, right middle and left lower lobe airspace disease was noted and suspicious for PNA. General surgery has been consultative.   Principal Problem:  SBO (small bowel obstruction)  - pt reports feeling better this AM, pt tolerating soft solids  - will advance diet to regular today                   - appreciate surgery and GI assistance  Active Problems:  Atrial fibrillation  - pt has spontaneously converted to NSR and in NSR over 72 hours  - amiodarone restarted and Metoprolol added as well as needed  - cardiology team consulted and recommendations appreciated  - Lasix ordered  Acute respiratory failure secondary to CHF, diastolic and PNA  - oxygen saturation remains at target range  - repeat CXR 01/10 with improving PNA but persistent bilateral pleural effusions - ABX changed to Vancomycin and Levaquin and today is the day #5/7  - will discontinue vancomycin and convert to oral Levaquin for two more days - sputum analysis, culture and gram stain negative to date  - continue Lasix  Pulmonary embolus  - Coumadin as per pharmacy  Nausea and vomiting  - secondary to SBO, opt clinically improving and tolerating clear liquid diet  - advance diet as tolerating   - continue to provide supportive care with analgesia as needed  Leukocytosis  - secondary to PNA and SBO  - WBC trending down and WNL this AM  CKD (chronic kidney disease), stage IV  - remains stable and at baseline, WNL this AM  - BMP in AM  Hypokalemia  - secondary to vomiting and diarrhea  - supplement as indicated  - place on probiotic, order C. Diff by PCR - BMP in AM   Consultants:  GI  Surgery  Cardiology  Procedures/Studies:  Dg Abd Portable 1v 01/18/2013 Nasogastric tube tip in proximal stomach with side port at the gastroesophageal junction. Advise advancing nasogastric tube 6 to 8 cm. Less bowel dilatation compared to earlier in the day. Contrast is seen in the cecum. No free air seen on this supine examination.  Dg Chest 2 View 01/21/2013 Increased bibasilar infiltrates consistent with pneumonia. Underlying COPD and bibasilar effusions.  Dg Abd 2 Views 01/21/2013 Few persistent mildly dilated small bowel loops.  Dg Chest 2 View 01/23/2013 Worsening bilateral pleural effusions are noted with right greater than left. However, bilateral perihilar and basilar opacities are improved compared to prior exam.  Antibiotics:  Flagyl 01/04 --> 01/07  Zithromax 01/04 --> 01/07  Levaquin 01/07 -->  Vancomycin 01/07 --> 01/12  Code Status: DNR  Family Communication: Pt and daughter at bedside  Disposition Plan: Home when medically stable    HPI/Subjective: No events overnight.   Objective: Filed Vitals:  01/24/13 1402 01/24/13 2045 01/25/13 0430 01/25/13 1435  BP: 122/57 155/66 147/65 165/62  Pulse: 72 73 78 77  Temp: 97.9 F (36.6 C) 97.9 F (36.6 C) 97.7 F (36.5 C) 98.2 F (36.8 C)  TempSrc: Oral Oral Oral Oral  Resp: 18 18 18 20   Height:      Weight:   59.966 kg (132 lb 3.2 oz)   SpO2: 99% 95% 97% 96%    Intake/Output Summary (Last 24 hours) at 01/25/13 2212 Last data filed at 01/25/13 1900  Gross per 24 hour  Intake    240 ml  Output   1000 ml  Net   -760 ml     Exam:   General:  Pt is alert, follows commands appropriately, not in acute distress  Cardiovascular: Regular rate and rhythm, S1/S2, no murmurs, no rubs, no gallops  Respiratory: Clear to auscultation bilaterally, no wheezing, no crackles, no rhonchi  Abdomen: Soft, non tender, non distended, bowel sounds present, no guarding  Extremities: No edema, pulses DP and PT palpable bilaterally  Neuro: Grossly nonfocal  Data Reviewed: Basic Metabolic Panel:  Recent Labs Lab 01/21/13 0537 01/22/13 0504 01/23/13 0430 01/24/13 0517 01/25/13 0533  NA 142 138 138 139 138  K 3.7 3.9 3.6* 3.8 3.6*  CL 107 103 103 105 101  CO2 20 21 23 25 24   GLUCOSE 110* 114* 97 99 101*  BUN 16 14 17 13 10   CREATININE 0.83 0.81 1.03 0.96 0.95  CALCIUM 8.1* 8.2* 8.0* 7.9* 8.2*   Liver Function Tests:  Recent Labs Lab 01/18/13 2230  AST 29  ALT 31  ALKPHOS 66  BILITOT 0.7  PROT 5.7*  ALBUMIN 3.0*   CBC:  Recent Labs Lab 01/21/13 0537 01/22/13 0504 01/23/13 0430 01/24/13 0517 01/25/13 0533  WBC 8.9 10.1 9.3 8.5 7.8  HGB 10.3* 11.2* 10.6* 10.5* 10.6*  HCT 32.1* 33.1* 32.5* 31.7* 32.3*  MCV 93.0 89.5 91.5 90.8 90.5  PLT 212 240 215 238 248     Recent Results (from the past 240 hour(s))  CULTURE, EXPECTORATED SPUTUM-ASSESSMENT     Status: None   Collection Time    01/21/13  4:50 PM      Result Value Range Status   Specimen Description SPUTUM   Final   Special Requests NONE   Final   Sputum evaluation     Final   Value: MICROSCOPIC FINDINGS SUGGEST THAT THIS SPECIMEN IS NOT REPRESENTATIVE OF LOWER RESPIRATORY SECRETIONS. PLEASE RECOLLECT.     Rhodia Albright RN [id number] 01/21/13 A NAVARRO   Report Status 01/21/2013 FINAL   Final  CULTURE, EXPECTORATED SPUTUM-ASSESSMENT     Status: None   Collection Time    01/22/13  2:16 AM      Result Value Range Status   Specimen Description SPUTUM   Final   Special Requests Normal   Final   Sputum evaluation     Final   Value: THIS  SPECIMEN IS ACCEPTABLE. RESPIRATORY CULTURE REPORT TO FOLLOW.   Report Status 01/22/2013 FINAL   Final  CULTURE, RESPIRATORY (NON-EXPECTORATED)     Status: None   Collection Time    01/22/13  2:16 AM      Result Value Range Status   Specimen Description SPUTUM   Final   Special Requests NONE   Final   Gram Stain     Final   Value: NO WBC SEEN     RARE SQUAMOUS EPITHELIAL CELLS PRESENT     NO ORGANISMS SEEN  Performed at [facility]     Final   Value: NO GROWTH 2 DAYS     Performed at [facility]   Report Status 01/24/2013 FINAL   Final     Scheduled Meds: . amiodarone  100 mg Oral BID  . antiseptic oral rinse  15 mL Mouth Rinse q12n4p  . chlorhexidine  15 mL Mouth Rinse BID  . furosemide  40 mg Oral Daily  . levofloxacin  750 mg Oral Q48H  . [START ON 01/26/2013] levothyroxine  75 mcg Oral QAC breakfast  . mesalamine  4.8 g Oral Q breakfast  . pantoprazole  40 mg Oral Daily  . potassium chloride  10 mEq Oral TID  . saccharomyces boulardii  250 mg Oral BID  . sodium chloride  3 mL Intravenous Q12H  . Warfarin - Pharmacist Dosing Inpatient   Does not apply q1800   Continuous Infusions:    Faye Ramsay, MD  [facility] Pager [phone]  If 7PM-7AM, please contact night-coverage [email or web address] Password [email or web address] 01/25/2013, 10:12 PM   LOS: 8 days

## 2013-01-26 ENCOUNTER — Inpatient Hospital Stay (HOSPITAL_COMMUNITY): Payer: Medicare Other

## 2013-01-26 LAB — BASIC METABOLIC PANEL
BUN: 14 mg/dL (ref 6–23)
CHLORIDE: 103 meq/L (ref 96–112)
CO2: 26 mEq/L (ref 19–32)
Calcium: 8.3 mg/dL — ABNORMAL LOW (ref 8.4–10.5)
Creatinine, Ser: 1.2 mg/dL — ABNORMAL HIGH (ref 0.50–1.10)
GFR calc non Af Amer: 39 mL/min — ABNORMAL LOW (ref >90)
GFR, EST AFRICAN AMERICAN: 45 mL/min — AB (ref >90)
Glucose, Bld: 102 mg/dL — ABNORMAL HIGH (ref 70–99)
POTASSIUM: 4.3 meq/L (ref 3.7–5.3)
Sodium: 139 mEq/L (ref 137–147)

## 2013-01-26 LAB — CBC
HCT: 32.1 % — ABNORMAL LOW (ref 36.0–46.0)
HEMOGLOBIN: 10.8 g/dL — AB (ref 12.0–15.0)
MCH: 30.1 pg (ref 26.0–34.0)
MCHC: 33.6 g/dL (ref 30.0–36.0)
MCV: 89.4 fL (ref 78.0–100.0)
Platelets: 284 10*3/uL (ref 150–400)
RBC: 3.59 MIL/uL — ABNORMAL LOW (ref 3.87–5.11)
RDW: 14.6 % (ref 11.5–15.5)
WBC: 7.1 10*3/uL (ref 4.0–10.5)

## 2013-01-26 LAB — PROTIME-INR
INR: 2.23 — AB (ref 0.00–1.49)
Prothrombin Time: 24 seconds — ABNORMAL HIGH (ref 11.6–15.2)

## 2013-01-26 LAB — CLOSTRIDIUM DIFFICILE BY PCR: Toxigenic C. Difficile by PCR: NEGATIVE

## 2013-01-26 MED ORDER — LEVOFLOXACIN 500 MG PO TABS
500.0000 mg | ORAL_TABLET | ORAL | Status: DC
  Filled 2013-01-26: qty 1

## 2013-01-26 MED ORDER — FUROSEMIDE 20 MG PO TABS
20.0000 mg | ORAL_TABLET | Freq: Every day | ORAL | Status: DC
  Administered 2013-01-27: 20 mg via ORAL
  Filled 2013-01-26: qty 1

## 2013-01-26 MED ORDER — LEVOTHYROXINE SODIUM 150 MCG PO TABS
150.0000 ug | ORAL_TABLET | Freq: Every day | ORAL | Status: DC
  Administered 2013-01-27: 10:00:00 150 ug via ORAL
  Filled 2013-01-26 (×2): qty 1

## 2013-01-26 MED ORDER — WARFARIN SODIUM 2 MG PO TABS
2.0000 mg | ORAL_TABLET | Freq: Once | ORAL | Status: AC
  Administered 2013-01-26: 19:00:00 2 mg via ORAL
  Filled 2013-01-26: qty 1

## 2013-01-26 MED ORDER — LEVOTHYROXINE SODIUM 75 MCG PO TABS
75.0000 ug | ORAL_TABLET | Freq: Once | ORAL | Status: DC
  Filled 2013-01-26: qty 1

## 2013-01-26 NOTE — Progress Notes (Signed)
Patient ID: [name], female   DOB: 03-28-1920, 80 y.o.   MRN: [id number]  [facility] PROGRESS NOTE  ARIZONA CORNIER [id number] DOB: [birth year] DOA: 01/17/2013 PCP: [name], MD  Brief narrative:  80 y.o. year-old female with history of PAF, DVT and PE on A/C and s/p IVC filter, microscopic colitis and hx of self-resolved SBO, temporal arteritis no longer on prednisone, hypothyroidism, blindness, spinal stenosis who presented to [facility] ED with sharp and constant LUQ abdominal pain that started several days PTA, associated with non bloody diarrhea and bilious vomiting.   In the ED, WBC 13.1, BUN 45 Cr 1.36, INR 2.64. CT scan of the abdomen and pelvis demonstrated a distal SBO with no perforation or abscess. Incidentally, right middle and left lower lobe airspace disease was noted and suspicious for PNA. General surgery has been consultative.   Principal Problem:  SBO (small bowel obstruction)  - pt reports feeling better this AM, pt tolerating regular diet well  - appreciate surgery and GI assistance  Active Problems:  Atrial fibrillation  - pt has spontaneously converted to NSR and in NSR over 72 hours  - amiodarone restarted and Metoprolol added as well as needed  - cardiology team consulted and recommendations appreciated  - Lasix ordered and pt responding well  Acute respiratory failure secondary to CHF, diastolic and PNA  - oxygen saturation remains at target range  - repeat CXR 01/10 with improving PNA but persistent bilateral pleural effusions - ABX changed to Vancomycin and Levaquin and today is the day #6/7  - sputum analysis, culture and gram stain negative to date  - continue Lasix  Pulmonary embolus  - Coumadin as per pharmacy  Nausea and vomiting  - secondary to SBO, opt clinically improving and tolerating clear liquid diet  - continue to provide supportive care with analgesia as needed  Leukocytosis  - secondary to PNA and SBO  - WBC trending down and WNL  this AM  CKD (chronic kidney disease), stage IV  - remains stable and at baseline, slightly up from yesterday and secondary to Lasix  - will monitor closely, possible discharge on Lasix 20 mg PO QD  Hypokalemia  - secondary to vomiting and diarrhea  - supplement as indicated  - place on probiotic, C. Diff negative  - BMP in AM   Consultants:  GI  Surgery  Cardiology  Procedures/Studies:  Dg Abd Portable 1v 01/18/2013 Nasogastric tube tip in proximal stomach with side port at the gastroesophageal junction. Advise advancing nasogastric tube 6 to 8 cm. Less bowel dilatation compared to earlier in the day. Contrast is seen in the cecum. No free air seen on this supine examination.  Dg Chest 2 View 01/21/2013 Increased bibasilar infiltrates consistent with pneumonia. Underlying COPD and bibasilar effusions.  Dg Abd 2 Views 01/21/2013 Few persistent mildly dilated small bowel loops.  Dg Chest 2 View 01/23/2013 Worsening bilateral pleural effusions are noted with right greater than left. However, bilateral perihilar and basilar opacities are improved compared to prior exam.  Antibiotics:  Flagyl 01/04 --> 01/07  Zithromax 01/04 --> 01/07  Levaquin 01/07 -->  Vancomycin 01/07 --> 01/12  Code Status: DNR  Family Communication: Pt and daughter at bedside  Disposition Plan: Home when medically stable, likely in AM   HPI/Subjective: No events overnight.   Objective: Filed Vitals:   01/25/13 1435 01/25/13 2100 01/26/13 0500 01/26/13 1437  BP: 165/62 138/70 129/60 134/57  Pulse: 77 72 77 74  Temp: 98.2 F (36.8  C) 98.5 F (36.9 C) 98.7 F (37.1 C) 98.9 F (37.2 C)  TempSrc: Oral Oral Oral Oral  Resp: 20 17 14 16   Height:      Weight:   59.784 kg (131 lb 12.8 oz)   SpO2: 96% 98% 99% 97%    Intake/Output Summary (Last 24 hours) at 01/26/13 1701 Last data filed at 01/26/13 1500  Gross per 24 hour  Intake    240 ml  Output   1150 ml  Net   -910 ml    Exam:   General:  Pt is alert,  follows commands appropriately, not in acute distress  Cardiovascular: Regular rate and rhythm, S1/S2, no murmurs, no rubs, no gallops  Respiratory: Clear to auscultation bilaterally, no wheezing, no crackles, no rhonchi  Abdomen: Soft, non tender, non distended, bowel sounds present, no guarding  Extremities: No edema, pulses DP and PT palpable bilaterally  Neuro: Grossly nonfocal  Data Reviewed: Basic Metabolic Panel:  Recent Labs Lab 01/22/13 0504 01/23/13 0430 01/24/13 0517 01/25/13 0533 01/26/13 0440  NA 138 138 139 138 139  K 3.9 3.6* 3.8 3.6* 4.3  CL 103 103 105 101 103  CO2 21 23 25 24 26   GLUCOSE 114* 97 99 101* 102*  BUN 14 17 13 10 14   CREATININE 0.81 1.03 0.96 0.95 1.20*  CALCIUM 8.2* 8.0* 7.9* 8.2* 8.3*   CBC:  Recent Labs Lab 01/22/13 0504 01/23/13 0430 01/24/13 0517 01/25/13 0533 01/26/13 0440  WBC 10.1 9.3 8.5 7.8 7.1  HGB 11.2* 10.6* 10.5* 10.6* 10.8*  HCT 33.1* 32.5* 31.7* 32.3* 32.1*  MCV 89.5 91.5 90.8 90.5 89.4  PLT 240 215 238 248 284    Recent Results (from the past 240 hour(s))  CULTURE, EXPECTORATED SPUTUM-ASSESSMENT     Status: None   Collection Time    01/21/13  4:50 PM      Result Value Range Status   Specimen Description SPUTUM   Final   Special Requests NONE   Final   Sputum evaluation     Final   Value: MICROSCOPIC FINDINGS SUGGEST THAT THIS SPECIMEN IS NOT REPRESENTATIVE OF LOWER RESPIRATORY SECRETIONS. PLEASE RECOLLECT.     Rhodia Albright RN [id number] 01/21/13 A NAVARRO   Report Status 01/21/2013 FINAL   Final  CULTURE, EXPECTORATED SPUTUM-ASSESSMENT     Status: None   Collection Time    01/22/13  2:16 AM      Result Value Range Status   Specimen Description SPUTUM   Final   Special Requests Normal   Final   Sputum evaluation     Final   Value: THIS SPECIMEN IS ACCEPTABLE. RESPIRATORY CULTURE REPORT TO FOLLOW.   Report Status 01/22/2013 FINAL   Final  CULTURE, RESPIRATORY (NON-EXPECTORATED)     Status: None   Collection  Time    01/22/13  2:16 AM      Result Value Range Status   Specimen Description SPUTUM   Final   Special Requests NONE   Final   Gram Stain     Final   Value: NO WBC SEEN     RARE SQUAMOUS EPITHELIAL CELLS PRESENT     NO ORGANISMS SEEN     Performed at [facility]   Culture     Final   Value: NO GROWTH 2 DAYS     Performed at [facility]   Report Status 01/24/2013 FINAL   Final  CLOSTRIDIUM DIFFICILE BY PCR     Status: None  Collection Time    01/26/13 11:33 AM      Result Value Range Status   C difficile by pcr NEGATIVE  NEGATIVE Final   Comment: Performed at [facility]     Scheduled Meds: . amiodarone  100 mg Oral BID  . antiseptic oral rinse  15 mL Mouth Rinse q12n4p  . chlorhexidine  15 mL Mouth Rinse BID  . furosemide  40 mg Oral Daily  . [START ON 01/27/2013] levofloxacin  500 mg Oral Q48H  . [START ON 01/27/2013] levothyroxine  150 mcg Oral QAC breakfast  . levothyroxine  75 mcg Oral Once  . mesalamine  4.8 g Oral Q breakfast  . pantoprazole  40 mg Oral Daily  . potassium chloride  10 mEq Oral TID  . saccharomyces boulardii  250 mg Oral BID  . sodium chloride  3 mL Intravenous Q12H  . warfarin  2 mg Oral ONCE-1800  . Warfarin - Pharmacist Dosing Inpatient   Does not apply q1800   Continuous Infusions:    Faye Ramsay, MD  [facility] Pager [phone]  If 7PM-7AM, please contact night-coverage [email or web address] Password [email or web address] 01/26/2013, 5:01 PM   LOS: 9 days

## 2013-01-26 NOTE — Progress Notes (Signed)
ANTICOAGULATION CONSULT NOTE - Follow Up Consult  Pharmacy Consult for Coumadin/Lovenox if/when INR < 2 Indication: atrial fibrillation, h/o PE/DVT  Allergies  Allergen Reactions  . Cefdinir     Caused bad diarrhea  . Cortisone     Flushed hands and face   . Prednisone     cuased blood clots  . Sulfonamide Derivatives     Stomach pains and cramping     Labs:  Recent Labs  01/24/13 0517 01/25/13 0533 01/26/13 0440  HGB 10.5* 10.6* 10.8*  HCT 31.7* 32.3* 32.1*  PLT 238 248 284  LABPROT 29.4* 24.3* 24.0*  INR 2.91* 2.27* 2.23*  CREATININE 0.96 0.95 1.20*    Estimated Creatinine Clearance: 29.2 ml/min (by C-G formula based on Cr of 1.2).   Assessment: 37 yoF with hx PAF, DVT, and PE on warfarin PTA and s/p IVC filter. Pt presented with abdominal pain and nausea. INR therapeutic on admit. CT abd/pelvis showed distal SBO, possible internal hernia, no perforation or abscess. Surgery on board, coumadin held and pharmacy received order to start Lovenox if INR < 2 on 1/7.  On 1/9, pharmacy received order to resume Coumadin. Pt reported was on Coumadin 2.5mg  daily except 1.25mg  MWF prior to admission. Coumadin note resumed until 1/12 when INR trended down to therapeutic.   INR 2.23 today, Hgb stable. No bleeding. Plts wnl.   Significant drug-drug interaction noted with new start amiodarone and Levaquin. Concurrent amiodarone and warfarin can significantly increase the risk of bleeding/elevated INR but the onset of interaction is relatively slow (weeks).  In the long-run, Coumadin dose will need to be reduced.   Goal of Therapy:  INR 2-3 Monitor platelets by anticoagulation protocol: Yes   Plan:   Continue Coumadin tonight at 2mg  x 1   MD, please remove Lovenox consult if no longer wants to add if INR < 2  Daily PT/INR  Vanessa [employer], PharmD, BCPS Pager: [phone] 11:27 AM Pharmacy #: [id number]

## 2013-01-26 NOTE — Progress Notes (Signed)
Occupational Therapy Treatment Patient Details Name: CALEESI GLATT MRN: [id number] DOB: 1920-10-13 Today's Date: 01/26/2013 Time: [id number] OT Time Calculation (min): 19 min  OT Assessment / Plan / Recommendation  History of present illness Pt is a 80 year old female who presented to the emergency department this morning after developing abdominal pain nausea and vomiting overnight. Her family is present in the room. They report multiple episodes of emesis. She described pain as cramping and the feeling like there was a watermelon on her stomach. The pain started centrally and in the upper abdomen. There is no hematemesis. She had a bowel movement last evening but has not passed flatus since. She had a similar episode in 2011 which resolved without the need for surgery or even an NG tube. Currently, she is more comfortable since a nasogastric tube is in place. She is otherwise without complaints. She denies shortness of breath or chest pain. There have been no fevers or chills.   OT comments  Discussed at length with pt and family regarding safety recommendation for grab bar placement in the shower and a toilet riser with handles for her commode as she doesn't have much space for a 3in1.     Follow Up Recommendations  No OT follow up;Supervision/Assistance - 24 hour    Barriers to Discharge       Equipment Recommendations  Toilet riser (with handles)    Recommendations for Other Services    Frequency Min 2X/week   Progress towards OT Goals Progress towards OT goals: Progressing toward goals  Plan Discharge plan remains appropriate    Precautions / Restrictions Precautions Precautions: Fall Restrictions Weight Bearing Restrictions: No   Pertinent Vitals/Pain No complaint of   ADL  Toilet Transfer: Performed;Min guard Toilet Transfer Equipment: Comfort height toilet Toileting - Clothing Manipulation and Hygiene: Performed;Min guard Where Assessed - Best boy  and Hygiene: Sit to stand from 3-in-1 or toilet Tub/Shower Transfer: Performed;Minimal assistance Equipment Used: Other (comment) (4 wheel walker) ADL Comments: Pt simulated a step over the bathtub and needed min assist for safety. She usually hold to the back of the commode as she steps over the side and then sits on a seat in the tub. Discussed tubbench option but pt states she doesnt have much room in her bathroom for one. Pt's daughters report she does have back issues and are concerned about her leaning over to hold to back of toilet. Discussed grab bar placement on shower to be able to stand more erect and  use to step over the side of tub. They have family that can place bar. Discussed options for placement of grab bar.  daughters will be there to assist pt for awhile at d/c. Pt has a standard height commode at home and has used a towel rod to pull up from commode. Discussed safety implications of using towel rod and feel she will benefit from either a 3in1 or riser with handles. She tends to want t pull up on grab bar or her walker to help her stand. Pt would like to look into riser with handles as it would take up less space.     OT Diagnosis:    OT Problem List:   OT Treatment Interventions:     OT Goals(current goals can now be found in the care plan section)    Visit Information  Last OT Received On: 01/26/13 Assistance Needed: +1 History of Present Illness: Pt is a 80 year old female who presented to the  emergency department this morning after developing abdominal pain nausea and vomiting overnight. Her family is present in the room. They report multiple episodes of emesis. She described pain as cramping and the feeling like there was a watermelon on her stomach. The pain started centrally and in the upper abdomen. There is no hematemesis. She had a bowel movement last evening but has not passed flatus since. She had a similar episode in 2011 which resolved without the need for surgery or  even an NG tube. Currently, she is more comfortable since a nasogastric tube is in place. She is otherwise without complaints. She denies shortness of breath or chest pain. There have been no fevers or chills.    Subjective Data      Prior Functioning       Cognition  Cognition Arousal/Alertness: Awake/alert Behavior During Therapy: WFL for tasks assessed/performed Overall Cognitive Status: Within Functional Limits for tasks assessed    Mobility  Bed Mobility Sit to supine: Modified independent (Device/Increase time) Transfers Overall transfer level: Needs assistance Equipment used: 4-wheeled walker Transfers: Sit to/from Stand Sit to Stand: Min guard General transfer comment: verbal cues for hand placement.     Exercises      Balance    End of Session OT - End of Session Equipment Utilized During Treatment: Rolling walker Activity Tolerance: Patient tolerated treatment well Patient left: in bed;with call bell/phone within reach;with family/visitor present  [place], [place] [id number] 01/26/2013, 1:18 PM

## 2013-01-26 NOTE — Progress Notes (Signed)
SUBJECTIVE:  No palpitations  OBJECTIVE:   Vitals:   Filed Vitals:   01/25/13 0430 01/25/13 1435 01/25/13 2100 01/26/13 0500  BP: 147/65 165/62 138/70 129/60  Pulse: 78 77 72 77  Temp: 97.7 F (36.5 C) 98.2 F (36.8 C) 98.5 F (36.9 C) 98.7 F (37.1 C)  TempSrc: Oral Oral Oral Oral  Resp: 18 20 17 14   Height:      Weight: 132 lb 3.2 oz (59.966 kg)   131 lb 12.8 oz (59.784 kg)  SpO2: 97% 96% 98% 99%   I&O's:    Intake/Output Summary (Last 24 hours) at 01/26/13 [id number] Last data filed at 01/26/13 0700  Gross per 24 hour  Intake    360 ml  Output    950 ml  Net   -590 ml   TELEMETRY: Reviewed telemetry pt in NSR:     PHYSICAL EXAM General: Well developed, well nourished, in no acute distress Head:   Normal cephalic and atramatic  Lungs:   Clear bilaterally to auscultation and percussion. Heart:   HRRR S1 S2 , No JVD.   Abdomen: abdomen soft and non-tender  Msk:  Back normal, normal gait. Normal strength and tone for age. Extremities:  No edema.  DP +1 Neuro: Alert and oriented X 3. Psych:  Normal affect, responds appropriately   LABS: Basic Metabolic Panel:  Recent Labs  01/25/13 0533 01/26/13 0440  NA 138 139  K 3.6* 4.3  CL 101 103  CO2 24 26  GLUCOSE 101* 102*  BUN 10 14  CREATININE 0.95 1.20*  CALCIUM 8.2* 8.3*   Liver Function Tests: No results found for this basename: AST, ALT, ALKPHOS, BILITOT, PROT, ALBUMIN,  in the last 72 hours No results found for this basename: LIPASE, AMYLASE,  in the last 72 hours CBC:  Recent Labs  01/25/13 0533 01/26/13 0440  WBC 7.8 7.1  HGB 10.6* 10.8*  HCT 32.3* 32.1*  MCV 90.5 89.4  PLT 248 284   Cardiac Enzymes: No results found for this basename: CKTOTAL, CKMB, CKMBINDEX, TROPONINI,  in the last 72 hours BNP: No components found with this basename: POCBNP,  D-Dimer: No results found for this basename: DDIMER,  in the last 72 hours Hemoglobin A1C: No results found for this basename: HGBA1C,  in the last  72 hours Fasting Lipid Panel: No results found for this basename: CHOL, HDL, LDLCALC, TRIG, CHOLHDL, LDLDIRECT,  in the last 72 hours Thyroid Function Tests: No results found for this basename: TSH, T4TOTAL, FREET3, T3FREE, THYROIDAB,  in the last 72 hours Anemia Panel: No results found for this basename: VITAMINB12, FOLATE, FERRITIN, TIBC, IRON, RETICCTPCT,  in the last 72 hours Coag Panel:   Lab Results  Component Value Date   INR 2.23* 01/26/2013   INR 2.27* 01/25/2013   INR 2.91* 01/24/2013    RADIOLOGY: Dg Chest 2 View  01/23/2013   CLINICAL DATA:  Shortness of breath, weakness.  EXAM: CHEST  2 VIEW  COMPARISON:  January 21, 2013.  FINDINGS: Stable cardiomediastinal silhouette. Mild to moderate bilateral pleural effusions are noted which are increased compared to prior exam. No pneumothorax is noted. Bilateral perihilar and basilar opacities are noted which are improved compared to prior exam.  IMPRESSION: Worsening bilateral pleural effusions are noted with right greater than left. However, bilateral perihilar and basilar opacities are improved compared to prior exam.   Electronically Signed   By: Sabino Dick M.D.   On: 01/23/2013 18:34   Dg Chest 2 View  01/21/2013   CLINICAL DATA:  Decreased oxygen saturation  EXAM: CHEST  2 VIEW  COMPARISON:  01/17/2013  FINDINGS: Minimal enlargement of cardiac silhouette.  Pulmonary vascular congestion.  Bibasilar opacities consistent with pneumonia increased since previous exam.  Bibasilar pleural effusions dependently.  Underlying COPD.  No pneumothorax.  Bones demineralized.  IMPRESSION: Increased bibasilar infiltrates consistent with pneumonia.  Underlying COPD and bibasilar effusions.   Electronically Signed   By: Lavonia Dana M.D.   On: 01/21/2013 10:57   Ct Abdomen Pelvis W Contrast  01/17/2013   CLINICAL DATA:  Abdominal pain with emesis this morning. History of megacolon. Mild leukocytosis.  EXAM: CT ABDOMEN AND PELVIS WITH CONTRAST  TECHNIQUE:  Multidetector CT imaging of the abdomen and pelvis was performed using the standard protocol following bolus administration of intravenous contrast.  CONTRAST:  48mL OMNIPAQUE IOHEXOL 300 MG/ML SOLN, 24mL OMNIPAQUE IOHEXOL 300 MG/ML SOLN  COMPARISON:  Ultrasound 04/25/2010.  CT 01/01/2010.  FINDINGS: On initial scanning, the IV tubing leaked resulting in imaging prior to contrast administration. After securing the IV line, contrast was reinjected and the scan was repeated. Patient was not charged for the noncontrast portion of this study.  There is new patchy airspace disease in the right middle lobe. Patchy left lower lobe airspace disease is similar to the prior examination. There is mild underlying emphysema. No significant pleural or pericardial effusion is present.  The liver, gallbladder and biliary system appear unremarkable. There is chronic fatty replacement of the pancreas without focal abnormality. The spleen, adrenal glands and kidneys appear unremarkable.  There is moderate dilatation of the stomach and proximal to mid small bowel. The distal small bowel is decompressed. Area of transition appears to be in the right false pelvis where there appears to be some twirling of the mesentery on the sagittal images, suggesting a possible internal hernia. No bowel wall hernias are identified. No surrounding mass is identified. There is no small bowel wall thickening, extraluminal fluid collection or pneumatosis.  The colon is decompressed. There is sigmoid colon atherosclerosis without surrounding inflammatory change. The uterus, ovaries and bladder appear normal.  There is diffuse aortoiliac atherosclerosis. IVC filter is again noted with penetration of the limbs beyond on the walls. There is no retroperitoneal hematoma or lymphadenopathy.  Thoracolumbar scoliosis and spondylosis are stable. There is stable chronic right femoral head avascular necrosis without subchondral collapse.  IMPRESSION: 1. Distal small  bowel obstruction with apparent area of transition in the right false pelvis. There is a possible internal hernia in this region. No bowel wall hernia identified. 2. No evidence of perforation or abscess. 3. Sigmoid colon diverticulosis. 4. Right middle and left lower lobe airspace disease suspicious for pneumonia.   Electronically Signed   By: Camie Patience M.D.   On: 01/17/2013 09:09   Dg Chest Port 1 View  01/17/2013   CLINICAL DATA:  CT scan revealed abnormal opacities at the lung bases.  EXAM: PORTABLE CHEST - 1 VIEW  COMPARISON:  01/17/2013; 10/13/2012  FINDINGS: Hazy airspace opacities are present in the right middle lobe, left lower lobe, and possibly the lingula. These are new compared to 10/13/12.  Heart size within normal limits.  Nasogastric tube tip:  Stomach.  IMPRESSION: 1. Right middle lobe, left lower lobe, and possibly lingular airspace opacities favoring multi lobar pneumonia.   Electronically Signed   By: Sherryl Barters M.D.   On: 01/17/2013 11:49   Dg Abd 2 Views  01/21/2013   CLINICAL DATA:  Abdominal distention,  small bowel obstruction  EXAM: ABDOMEN - 2 VIEW  COMPARISON:  01/18/2013  FINDINGS: Tip of nasogastric tube projects over gastric antrum.  IVC filter present.  Few mildly distended loops of small bowel are again seen in the abdomen.  Small amount of contrast and gas within colon.  No definite bowel wall thickening or free intraperitoneal air.  Osseous demineralization with degenerative changes and scoliosis of the lumbar spine.  IMPRESSION: Few persistent mildly dilated small bowel loops.   Electronically Signed   By: Lavonia Dana M.D.   On: 01/21/2013 08:13   Dg Abd Portable 1v  01/18/2013   CLINICAL DATA:  Small bowel obstruction  EXAM: PORTABLE ABDOMEN - 1 VIEW  COMPARISON:  Study obtained earlier in the day  FINDINGS: Nasogastric tube tip is in the proximal stomach with the side port at the gastroesophageal junction. There is less bowel dilatation compared to earlier in the  day. There is contrast in the small bowel and cecum. Inferior vena cava filter position unchanged. There is scoliosis and degenerative change in the lumbar spine. No free air is seen on this supine examination.  IMPRESSION: Nasogastric tube tip in proximal stomach with side port at the gastroesophageal junction. Advise advancing nasogastric tube 6 to 8 cm.  Less bowel dilatation compared to earlier in the day. Contrast is seen in the cecum.  No free air seen on this supine examination.  Stable lumbar arthropathy.   Electronically Signed   By: Lowella Grip M.D.   On: 01/18/2013 23:03   Dg Abd Portable 1v  01/18/2013   CLINICAL DATA:  Distended abdomen with abdominal pain.  EXAM: PORTABLE ABDOMEN - 1 VIEW  COMPARISON:  01/17/2013.  FINDINGS: The small bowel obstruction pattern persists, with maximal diameter of small bowel loops in the central abdomen measuring 43 mm. No gross plain film evidence of free air on this supine radiograph. IVC filter again noted. Stool and bowel gas remains present within the rectosigmoid. Bilateral femoral head AVN is incidentally noted.  IMPRESSION: Persistent small bowel obstruction. Maximal diameter of small bowel is 43 mm.   Electronically Signed   By: Dereck Ligas M.D.   On: 01/18/2013 07:59      ASSESSMENT/PLAN:   AFib:  Maintaining NSR.  Diuresing well.  Normal EF.  Continue amiodarone, metoprolol.    Diastolic heart failure is improved.  No oxygen requirement.    Coumadin for stroke prevention.  Therapeutic.    Will sign off  At this time.  Call if there are further questions.  Jettie Booze., MD  01/26/2013  8:52 AM

## 2013-01-27 LAB — BASIC METABOLIC PANEL
BUN: 17 mg/dL (ref 6–23)
CO2: 25 mEq/L (ref 19–32)
Calcium: 8.6 mg/dL (ref 8.4–10.5)
Chloride: 101 mEq/L (ref 96–112)
Creatinine, Ser: 1.31 mg/dL — ABNORMAL HIGH (ref 0.50–1.10)
GFR calc Af Amer: 40 mL/min — ABNORMAL LOW (ref >90)
GFR calc non Af Amer: 35 mL/min — ABNORMAL LOW (ref >90)
Glucose, Bld: 103 mg/dL — ABNORMAL HIGH (ref 70–99)
POTASSIUM: 4.4 meq/L (ref 3.7–5.3)
Sodium: 137 mEq/L (ref 137–147)

## 2013-01-27 LAB — CBC
HEMATOCRIT: 33.8 % — AB (ref 36.0–46.0)
Hemoglobin: 11.3 g/dL — ABNORMAL LOW (ref 12.0–15.0)
MCH: 30.2 pg (ref 26.0–34.0)
MCHC: 33.4 g/dL (ref 30.0–36.0)
MCV: 90.4 fL (ref 78.0–100.0)
Platelets: 298 10*3/uL (ref 150–400)
RBC: 3.74 MIL/uL — ABNORMAL LOW (ref 3.87–5.11)
RDW: 14.8 % (ref 11.5–15.5)
WBC: 7.3 10*3/uL (ref 4.0–10.5)

## 2013-01-27 LAB — PROTIME-INR
INR: 2.1 — ABNORMAL HIGH (ref 0.00–1.49)
Prothrombin Time: 22.9 seconds — ABNORMAL HIGH (ref 11.6–15.2)

## 2013-01-27 MED ORDER — WARFARIN SODIUM 2 MG PO TABS
2.0000 mg | ORAL_TABLET | Freq: Once | ORAL | Status: DC
  Filled 2013-01-27: qty 1

## 2013-01-27 MED ORDER — SACCHAROMYCES BOULARDII 250 MG PO CAPS: 250.0000 mg | ORAL_CAPSULE | Freq: Two times a day (BID) | ORAL | Status: DC

## 2013-01-27 MED ORDER — FUROSEMIDE 20 MG PO TABS: 10.0000 mg | ORAL_TABLET | Freq: Every day | ORAL | Status: DC

## 2013-01-27 NOTE — Discharge Instructions (Signed)
Pneumonia, Adult °Pneumonia is an infection of the lungs.  °CAUSES °Pneumonia may be caused by bacteria or a virus. Usually, these infections are caused by breathing infectious particles into the lungs (respiratory tract). °SYMPTOMS  °· Cough. °· Fever. °· Chest pain. °· Increased rate of breathing. °· Wheezing. °· Mucus production. °DIAGNOSIS  °If you have the common symptoms of pneumonia, your caregiver will typically confirm the diagnosis with a chest X-ray. The X-ray will show an abnormality in the lung (pulmonary infiltrate) if you have pneumonia. Other tests of your blood, urine, or sputum may be done to find the specific cause of your pneumonia. Your caregiver may also do tests (blood gases or pulse oximetry) to see how well your lungs are working. °TREATMENT  °Some forms of pneumonia may be spread to other people when you cough or sneeze. You may be asked to wear a mask before and during your exam. Pneumonia that is caused by bacteria is treated with antibiotic medicine. Pneumonia that is caused by the influenza virus may be treated with an antiviral medicine. Most other viral infections must run their course. These infections will not respond to antibiotics.  °PREVENTION °A pneumococcal shot (vaccine) is available to prevent a common bacterial cause of pneumonia. This is usually suggested for: °· People over 65 years old. °· Patients on chemotherapy. °· People with chronic lung problems, such as bronchitis or emphysema. °· People with immune system problems. °If you are over 65 or have a high risk condition, you may receive the pneumococcal vaccine if you have not received it before. In some countries, a routine influenza vaccine is also recommended. This vaccine can help prevent some cases of pneumonia. You may be offered the influenza vaccine as part of your care. °If you smoke, it is time to quit. You may receive instructions on how to stop smoking. Your caregiver can provide medicines and counseling to  help you quit. °HOME CARE INSTRUCTIONS  °· Cough suppressants may be used if you are losing too much rest. However, coughing protects you by clearing your lungs. You should avoid using cough suppressants if you can. °· Your caregiver may have prescribed medicine if he or she thinks your pneumonia is caused by a bacteria or influenza. Finish your medicine even if you start to feel better. °· Your caregiver may also prescribe an expectorant. This loosens the mucus to be coughed up. °· Only take over-the-counter or prescription medicines for pain, discomfort, or fever as directed by your caregiver. °· Do not smoke. Smoking is a common cause of bronchitis and can contribute to pneumonia. If you are a smoker and continue to smoke, your cough may last several weeks after your pneumonia has cleared. °· A cold steam vaporizer or humidifier in your room or home may help loosen mucus. °· Coughing is often worse at night. Sleeping in a semi-upright position in a recliner or using a couple pillows under your head will help with this. °· Get rest as you feel it is needed. Your body will usually let you know when you need to rest. °SEEK IMMEDIATE MEDICAL CARE IF:  °· Your illness becomes worse. This is especially true if you are elderly or weakened from any other disease. °· You cannot control your cough with suppressants and are losing sleep. °· You begin coughing up blood. °· You develop pain which is getting worse or is uncontrolled with medicines. °· You have a fever. °· Any of the symptoms which initially brought you in for treatment   are getting worse rather than better.  You develop shortness of breath or chest pain. MAKE SURE YOU:   Understand these instructions.  Will watch your condition.  Will get help right away if you are not doing well or get worse. Document Released: 12/31/2004 Document Revised: 03/25/2011 Document Reviewed: 03/22/2010 [facility] Patient Information 2014 [place], [place]. Small Bowel  Obstruction A small bowel obstruction is a blockage (obstruction) of the small intestine (small bowel). The small bowel is a long, slender tube that connects the stomach to the colon. Its job is to absorb nutrients from the fluids and foods you consume into the bloodstream.  CAUSES  There are many causes of intestinal blockage. The most common ones include:  Hernias. This is a more common cause in children than adults.  Inflammatory bowel disease (enteritis and colitis).  Twisting of the bowel (volvulus).  Tumors.  Scar tissue (adhesions) from previous surgery or radiation treatment.  Recent surgery. This may cause an acute small bowel obstruction called an ileus. SYMPTOMS   Abdominal pain. This may be dull cramps or sharp pain. It may occur in one area or may be present in the entire abdomen. Pain can range from mild to severe, depending on the degree of obstruction.  Nausea and vomiting. Vomit may be greenish or yellow bile color.  Distended or swollen stomach. Abdominal bloating is a common symptom.  Constipation.  Lack of passing gas.  Frequent belching.  Diarrhea. This may occur if runny stool is able to leak around the obstruction. DIAGNOSIS  Your caregiver can usually diagnose small bowel obstruction by taking a history, doing a physical exam, and taking X-rays. If the cause is unclear, a CT scan (computerized tomography) of your abdomen and pelvis may be needed. TREATMENT  Treatment of the blockage depends on the cause and how bad the problem is.   Sometimes, the obstruction improves with bed rest and intravenous (IV) fluids.  Resting the bowel is very important. This means following a simple diet. Sometimes, a clear liquid diet may be required for several days.  Sometimes, a small tube (nasogastric tube) is placed into the stomach to decompress the bowel. When the bowel is blocked, it usually swells up like a balloon filled with air and fluids. Decompression means  that the air and fluids are removed by suction through that tube. This can help with pain, discomfort, and nausea. It can also help the obstruction resolve faster.  Surgery may be required if other treatments do not work. Bowel obstruction from a hernia may require early surgery and can be an emergency procedure. Adhesions that cause frequent or severe obstructions may also require surgery. HOME CARE INSTRUCTIONS If your bowel obstruction is only partial or incomplete, you may be allowed to go home.  Get plenty of rest.  Follow your diet as directed by your caregiver.  Only consume clear liquids until your condition improves.  Avoid solid foods as instructed. SEEK IMMEDIATE MEDICAL CARE IF:  You have increased pain or cramping.  You vomit blood.  You have uncontrolled vomiting or nausea.  You cannot drink fluids due to vomiting or pain.  You develop confusion.  You begin feeling very dry or thirsty (dehydrated).  You have severe bloating.  You have chills.  You have a fever.  You feel extremely weak or you faint. MAKE SURE YOU:  Understand these instructions.  Will watch your condition.  Will get help right away if you are not doing well or get worse. Document Released: 03/19/2005  Document Revised: 03/25/2011 Document Reviewed: 03/16/2010 [facility] Patient Information 2014 [place].

## 2013-01-27 NOTE — Progress Notes (Signed)
ANTICOAGULATION CONSULT NOTE - Follow Up Consult  Pharmacy Consult for Coumadin Indication: atrial fibrillation, h/o PE/DVT  Allergies  Allergen Reactions  . Cefdinir     Caused bad diarrhea  . Cortisone     Flushed hands and face   . Prednisone     cuased blood clots  . Sulfonamide Derivatives     Stomach pains and cramping     Labs:  Recent Labs  01/25/13 0533 01/26/13 0440 01/27/13 0445  HGB 10.6* 10.8* 11.3*  HCT 32.3* 32.1* 33.8*  PLT 248 284 298  LABPROT 24.3* 24.0* 22.9*  INR 2.27* 2.23* 2.10*  CREATININE 0.95 1.20* 1.31*    Estimated Creatinine Clearance: 26.3 ml/min (by C-G formula based on Cr of 1.31).   Assessment: 16 yoF with hx PAF, DVT, and PE on warfarin PTA and s/p IVC filter. Pt presented with abdominal pain and nausea. INR therapeutic on admit. CT abd/pelvis showed distal SBO, possible internal hernia, no perforation or abscess. Surgery on board, coumadin held and pharmacy received order to start Lovenox if INR < 2 on 1/7.  On 1/9, pharmacy received order to resume Coumadin. Pt reported was on Coumadin 2.5mg  daily except 1.25mg  MWF prior to admission. Coumadin not resumed until 1/12 when INR trended down to therapeutic.   INR continues to be therapeutic at 2.10 today, Hgb improved. No bleeding. Plts wnl.   Significant drug-drug interaction noted with new start amiodarone and Levaquin (plan to stop today). Concurrent amiodarone and warfarin can significantly increase the risk of bleeding/elevated INR but the onset of interaction is relatively slow (weeks). In the long-run, Coumadin dose will need to be reduced.   Goal of Therapy:  INR 2-3 Monitor platelets by anticoagulation protocol: Yes   Plan:   Continue Coumadin tonight at 2mg  x 1   Will discontinue Lovenox consult as INR has been therapeutic for the past few days   Daily PT/INR  Vanessa [place], PharmD, BCPS Pager: [phone] 10:20 AM Pharmacy #: [id number]

## 2013-01-27 NOTE — Discharge Summary (Signed)
Physician Discharge Summary  [name] [id number] DOB: [birth year] DOA: 01/17/2013  PCP: [name], MD  Admit date: 01/17/2013 Discharge date: 01/27/2013  Recommendations for Outpatient Follow-up:  1. Pt will need to follow up with PCP in 2-3 weeks post discharge 2. Please obtain BMP to evaluate electrolytes and kidney function 3. Please also check CBC to evaluate Hg and Hct levels 4. Please note that pt was discharged on Lasix 10 mg PO QD as per cardiology recommendations and will need to have an appointment with her cardiologist Dr. Daneen Schick in 2-3 weeks post discharge 5. Please also note that pt advised to schedule an appointment with Dr. Watt Climes her GI specialist   Discharge Diagnoses: SBO, multifocal PNA  Principal Problem:   SBO (small bowel obstruction) Active Problems:   Unspecified disorder of thyroid   Atrial fibrillation   Pulmonary embolus   Nausea and vomiting   Microscopic colitis   Leukocytosis   CKD (chronic kidney disease), stage IV   Aspiration pneumonia  Discharge Condition: Stable  Diet recommendation: Heart healthy diet discussed in details   Brief narrative:  80 y.o. year-old female with history of PAF, DVT and PE on A/C and s/p IVC filter, microscopic colitis and hx of self-resolved SBO, temporal arteritis no longer on prednisone, hypothyroidism, blindness, spinal stenosis who presented to [facility] ED with sharp and constant LUQ abdominal pain that started several days PTA, associated with non bloody diarrhea and bilious vomiting.   In the ED, WBC 13.1, BUN 45 Cr 1.36, INR 2.64. CT scan of the abdomen and pelvis demonstrated a distal SBO with no perforation or abscess. Incidentally, right middle and left lower lobe airspace disease was noted and suspicious for PNA. General surgery has been consultative.   Principal Problem:  SBO (small bowel obstruction)  - pt reports feeling better this AM, pt tolerating regular diet well  - appreciate surgery and  GI assistance  - no further recommendations  Active Problems:  Atrial fibrillation  - pt has spontaneously converted to NSR and in NSR over 72 hours  - amiodarone restarted and Metoprolol added as well as needed and pt has responded well  - cardiology team consulted and recommendations appreciated  - Lasix ordered and pt responding well  - Pt will be discharge don Lasix upon discharge 10 mg PO QD as per cardiology recommendations  Acute respiratory failure secondary to CHF, diastolic and PNA  - oxygen saturation remains at target range  - repeat CXR 01/10 with improving PNA but persistent bilateral pleural effusions - ABX changed to Vancomycin and Levaquin and today is the day #7/7  - sputum analysis, culture and gram stain negative to date  - continue Lasix as noted above  Pulmonary embolus  - Coumadin as per pharmacy  Nausea and vomiting  - secondary to SBO, opt clinically improving and tolerating clear liquid diet  - provided supportive care with analgesia as needed and pt has responded well - pt now tolerating regular diet weill  Leukocytosis  - secondary to PNA and SBO  - WBC trending down and WNL this AM  CKD (chronic kidney disease), stage IV  - remains stable and at baseline, slightly up from yesterday and secondary to Lasix  - will need to monitor closely as pt is now on Lasix but will decrease dose further from 20 mg PO QD to 10 mg PO QD - weight on discharge is 128 lbs and per pt her usual weigh is 136 lbs  Hypokalemia  -  secondary to vomiting and diarrhea, C. Diff negative   - supplemented and within normal limits this AM - placde on probiotic, C. Diff negative   Consultants:  GI  Surgery  Cardiology  Procedures/Studies:  Dg Abd Portable 1v 01/18/2013 Nasogastric tube tip in proximal stomach with side port at the gastroesophageal junction. Advise advancing nasogastric tube 6 to 8 cm. Less bowel dilatation compared to earlier in the day. Contrast is seen in the cecum.  No free air seen on this supine examination.  Dg Chest 2 View 01/21/2013 Increased bibasilar infiltrates consistent with pneumonia. Underlying COPD and bibasilar effusions.  Dg Abd 2 Views 01/21/2013 Few persistent mildly dilated small bowel loops.  Dg Chest 2 View 01/23/2013 Worsening bilateral pleural effusions are noted with right greater than left. However, bilateral perihilar and basilar opacities are improved compared to prior exam.  Antibiotics:  Flagyl 01/04 --> 01/07  Zithromax 01/04 --> 01/07  Levaquin 01/07 --> 1/14 Vancomycin 01/07 --> 01/12  Code Status: DNR  Family Communication: Pt and daughter at bedside  Disposition Plan: Home today   Discharge Exam: Filed Vitals:   01/27/13 0504  BP: 144/62  Pulse: 75  Temp: 97.8 F (36.6 C)  Resp: 16   Filed Vitals:   01/26/13 0500 01/26/13 1437 01/26/13 2206 01/27/13 0504  BP: 129/60 134/57 149/58 144/62  Pulse: 77 74 73 75  Temp: 98.7 F (37.1 C) 98.9 F (37.2 C) 98 F (36.7 C) 97.8 F (36.6 C)  TempSrc: Oral Oral Oral Oral  Resp: 14 16 16 16   Height:      Weight: 59.784 kg (131 lb 12.8 oz)   58.287 kg (128 lb 8 oz)  SpO2: 99% 97% 97% 95%    General: Pt is alert, follows commands appropriately, not in acute distress Cardiovascular: Regular rate and rhythm, S1/S2 +, no murmurs, no rubs, no gallops Respiratory: Clear to auscultation bilaterally, no wheezing, no crackles, no rhonchi Abdominal: Soft, non tender, non distended, bowel sounds +, no guarding Extremities: no edema, no cyanosis, pulses palpable bilaterally DP and PT Neuro: Grossly nonfocal  Discharge Instructions  Discharge Orders   Future Appointments Provider Department Dept Phone   03/03/2013 3:15 PM Trudie Buckler, [place] [facility]   03/11/2013 9:30 AM Star Age, MD [facility] [phone]   04/14/2013 7:30 AM [facility] [place] Office [phone]   Future Orders Complete By Expires    Diet - low sodium heart healthy  As directed    Increase activity slowly  As directed        Medication List         amiodarone 200 MG tablet  Commonly known as:  PACERONE  TAKE (1/2) TABLET DAILY.     CALCIUM 600 + D 600-200 MG-UNIT Tabs  Generic drug:  Calcium Carb-Cholecalciferol  Take 1 tablet by mouth 3 (three) times daily.     furosemide 20 MG tablet  Commonly known as:  LASIX  Take 0.5 tablets (10 mg total) by mouth daily.     GLUCOSAMINE CHONDROITIN COMPLX Tabs  Take 1 tablet by mouth 2 (two) times daily as needed (joint pain relief).     levothyroxine 150 MCG tablet  Commonly known as:  SYNTHROID, LEVOTHROID  Take 150 mcg by mouth daily before breakfast.     mesalamine 1.2 G EC tablet  Commonly known as:  LIALDA  Take 4.8 g by mouth daily with breakfast.     OCUVITE ADULT 50+ PO  Take 1 tablet by mouth daily.     saccharomyces boulardii 250 MG capsule  Commonly known as:  FLORASTOR  Take 1 capsule (250 mg total) by mouth 2 (two) times daily.     warfarin 2.5 MG tablet  Commonly known as:  COUMADIN  Take 2.5 mg by mouth See admin instructions. On Mon, Wed, Fri take 1/2 tab (1.25mg ) and Tues, Thurs, Sat, Sun take 1 tab (2.5mg ).           Follow-up Information   Schedule an appointment as soon as possible for a visit with [name], MD.   Specialty:  Family Medicine   Contact information:   [address] [place] [address] [phone]       Schedule an appointment as soon as possible for a visit with Sinclair Grooms, MD.   Specialty:  Cardiology   Contact information:   [id number] [place] [address] [phone]       Follow up with Faye Ramsay, MD. (call my cell phone [phone])    Specialty:  Internal Medicine   Contact information:   [address] [place] [address] [phone]        The results of significant diagnostics from this hospitalization (including  imaging, microbiology, ancillary and laboratory) are listed below for reference.     Microbiology: Recent Results (from the past 240 hour(s))  CULTURE, EXPECTORATED SPUTUM-ASSESSMENT     Status: None   Collection Time    01/21/13  4:50 PM      Result Value Range Status   Specimen Description SPUTUM   Final   Special Requests NONE   Final   Sputum evaluation     Final   Value: MICROSCOPIC FINDINGS SUGGEST THAT THIS SPECIMEN IS NOT REPRESENTATIVE OF LOWER RESPIRATORY SECRETIONS. PLEASE RECOLLECT.     Rhodia Albright RN [id number] 01/21/13 A [facility]   Report Status 01/21/2013 FINAL   Final  CULTURE, EXPECTORATED SPUTUM-ASSESSMENT     Status: None   Collection Time    01/22/13  2:16 AM      Result Value Range Status   Specimen Description SPUTUM   Final   Special Requests Normal   Final   Sputum evaluation     Final   Value: THIS SPECIMEN IS ACCEPTABLE. RESPIRATORY CULTURE REPORT TO FOLLOW.   Report Status 01/22/2013 FINAL   Final  CULTURE, RESPIRATORY (NON-EXPECTORATED)     Status: None   Collection Time    01/22/13  2:16 AM      Result Value Range Status   Specimen Description SPUTUM   Final   Special Requests NONE   Final   Gram Stain     Final   Value: NO WBC SEEN     RARE SQUAMOUS EPITHELIAL CELLS PRESENT     NO ORGANISMS SEEN     Performed at [facility]   Culture     Final   Value: NO GROWTH 2 DAYS     Performed at [facility]   Report Status 01/24/2013 FINAL   Final  CLOSTRIDIUM DIFFICILE BY PCR     Status: None   Collection Time    01/26/13 11:33 AM      Result Value Range Status   C difficile by pcr NEGATIVE  NEGATIVE Final   Comment: Performed at [place]: Basic Metabolic Panel:  Recent Labs Lab 01/23/13 0430 01/24/13 [id number] 01/25/13 0533 01/26/13 0440  01/27/13 0445  NA 138 139 138 139 137  K 3.6* 3.8 3.6* 4.3 4.4  CL 103 105 101 103 101  CO2 23 25 24 26 25   GLUCOSE 97 99 101* 102* 103*  BUN 17 13 10 14 17   CREATININE  1.03 0.96 0.95 1.20* 1.31*  CALCIUM 8.0* 7.9* 8.2* 8.3* 8.6   CBC:  Recent Labs Lab 01/23/13 0430 01/24/13 0517 01/25/13 0533 01/26/13 0440 01/27/13 0445  WBC 9.3 8.5 7.8 7.1 7.3  HGB 10.6* 10.5* 10.6* 10.8* 11.3*  HCT 32.5* 31.7* 32.3* 32.1* 33.8*  MCV 91.5 90.8 90.5 89.4 90.4  PLT 215 238 248 284 298   BNP: BNP (last 3 results)  Recent Labs  01/22/13 0504  PROBNP 10497.0*   SIGNED: Time coordinating discharge: Over 30 minutes  Faye Ramsay, MD  [facility] 01/27/2013, 12:39 PM Pager [phone]  If 7PM-7AM, please contact night-coverage [email or web address] Password [email or web address]

## 2013-01-27 NOTE — Progress Notes (Signed)
Spoke with pt, daughter at bedside concerning home health and DME. Talking scales are not covered by Medicare, cost $120.00 and lift for commode is also a fee. Pt will wait on these two items. Pt had no preference for Home Health, referral given to [facility] for CHF program with other Home Health needs.

## 2013-01-29 ENCOUNTER — Ambulatory Visit (INDEPENDENT_AMBULATORY_CARE_PROVIDER_SITE_OTHER): Payer: Medicare Other | Admitting: Cardiology

## 2013-01-29 DIAGNOSIS — I5031 Acute diastolic (congestive) heart failure: Secondary | ICD-10-CM | POA: Diagnosis not present

## 2013-01-29 DIAGNOSIS — N184 Chronic kidney disease, stage 4 (severe): Secondary | ICD-10-CM | POA: Diagnosis not present

## 2013-01-29 DIAGNOSIS — I4891 Unspecified atrial fibrillation: Secondary | ICD-10-CM

## 2013-01-29 DIAGNOSIS — H548 Legal blindness, as defined in USA: Secondary | ICD-10-CM | POA: Diagnosis not present

## 2013-01-29 DIAGNOSIS — Z7901 Long term (current) use of anticoagulants: Secondary | ICD-10-CM | POA: Diagnosis not present

## 2013-01-29 DIAGNOSIS — I509 Heart failure, unspecified: Secondary | ICD-10-CM | POA: Diagnosis not present

## 2013-01-29 LAB — POCT INR: INR: 2

## 2013-01-31 DIAGNOSIS — Z7901 Long term (current) use of anticoagulants: Secondary | ICD-10-CM | POA: Diagnosis not present

## 2013-01-31 DIAGNOSIS — N184 Chronic kidney disease, stage 4 (severe): Secondary | ICD-10-CM | POA: Diagnosis not present

## 2013-01-31 DIAGNOSIS — I4891 Unspecified atrial fibrillation: Secondary | ICD-10-CM | POA: Diagnosis not present

## 2013-01-31 DIAGNOSIS — I509 Heart failure, unspecified: Secondary | ICD-10-CM | POA: Diagnosis not present

## 2013-01-31 DIAGNOSIS — H548 Legal blindness, as defined in USA: Secondary | ICD-10-CM | POA: Diagnosis not present

## 2013-01-31 DIAGNOSIS — I5031 Acute diastolic (congestive) heart failure: Secondary | ICD-10-CM | POA: Diagnosis not present

## 2013-02-01 DIAGNOSIS — N184 Chronic kidney disease, stage 4 (severe): Secondary | ICD-10-CM | POA: Diagnosis not present

## 2013-02-01 DIAGNOSIS — I5031 Acute diastolic (congestive) heart failure: Secondary | ICD-10-CM | POA: Diagnosis not present

## 2013-02-01 DIAGNOSIS — Z7901 Long term (current) use of anticoagulants: Secondary | ICD-10-CM | POA: Diagnosis not present

## 2013-02-01 DIAGNOSIS — I4891 Unspecified atrial fibrillation: Secondary | ICD-10-CM | POA: Diagnosis not present

## 2013-02-01 DIAGNOSIS — H548 Legal blindness, as defined in USA: Secondary | ICD-10-CM | POA: Diagnosis not present

## 2013-02-01 DIAGNOSIS — I509 Heart failure, unspecified: Secondary | ICD-10-CM | POA: Diagnosis not present

## 2013-02-03 ENCOUNTER — Ambulatory Visit (INDEPENDENT_AMBULATORY_CARE_PROVIDER_SITE_OTHER): Payer: Medicare Other | Admitting: Pharmacist

## 2013-02-03 DIAGNOSIS — I4891 Unspecified atrial fibrillation: Secondary | ICD-10-CM

## 2013-02-03 DIAGNOSIS — I509 Heart failure, unspecified: Secondary | ICD-10-CM | POA: Diagnosis not present

## 2013-02-03 DIAGNOSIS — H548 Legal blindness, as defined in USA: Secondary | ICD-10-CM | POA: Diagnosis not present

## 2013-02-03 DIAGNOSIS — Z7901 Long term (current) use of anticoagulants: Secondary | ICD-10-CM | POA: Diagnosis not present

## 2013-02-03 DIAGNOSIS — I5031 Acute diastolic (congestive) heart failure: Secondary | ICD-10-CM | POA: Diagnosis not present

## 2013-02-03 DIAGNOSIS — N184 Chronic kidney disease, stage 4 (severe): Secondary | ICD-10-CM | POA: Diagnosis not present

## 2013-02-03 LAB — POCT INR: INR: 1.8

## 2013-02-04 DIAGNOSIS — I5031 Acute diastolic (congestive) heart failure: Secondary | ICD-10-CM | POA: Diagnosis not present

## 2013-02-04 DIAGNOSIS — N184 Chronic kidney disease, stage 4 (severe): Secondary | ICD-10-CM | POA: Diagnosis not present

## 2013-02-04 DIAGNOSIS — Z7901 Long term (current) use of anticoagulants: Secondary | ICD-10-CM | POA: Diagnosis not present

## 2013-02-04 DIAGNOSIS — I4891 Unspecified atrial fibrillation: Secondary | ICD-10-CM | POA: Diagnosis not present

## 2013-02-04 DIAGNOSIS — H548 Legal blindness, as defined in USA: Secondary | ICD-10-CM | POA: Diagnosis not present

## 2013-02-04 DIAGNOSIS — I509 Heart failure, unspecified: Secondary | ICD-10-CM | POA: Diagnosis not present

## 2013-02-05 DIAGNOSIS — I509 Heart failure, unspecified: Secondary | ICD-10-CM | POA: Diagnosis not present

## 2013-02-05 DIAGNOSIS — Z7901 Long term (current) use of anticoagulants: Secondary | ICD-10-CM | POA: Diagnosis not present

## 2013-02-05 DIAGNOSIS — N184 Chronic kidney disease, stage 4 (severe): Secondary | ICD-10-CM | POA: Diagnosis not present

## 2013-02-05 DIAGNOSIS — I4891 Unspecified atrial fibrillation: Secondary | ICD-10-CM | POA: Diagnosis not present

## 2013-02-05 DIAGNOSIS — H548 Legal blindness, as defined in USA: Secondary | ICD-10-CM | POA: Diagnosis not present

## 2013-02-05 DIAGNOSIS — I5031 Acute diastolic (congestive) heart failure: Secondary | ICD-10-CM | POA: Diagnosis not present

## 2013-02-06 DIAGNOSIS — I509 Heart failure, unspecified: Secondary | ICD-10-CM | POA: Diagnosis not present

## 2013-02-06 DIAGNOSIS — Z7901 Long term (current) use of anticoagulants: Secondary | ICD-10-CM | POA: Diagnosis not present

## 2013-02-06 DIAGNOSIS — I5031 Acute diastolic (congestive) heart failure: Secondary | ICD-10-CM | POA: Diagnosis not present

## 2013-02-06 DIAGNOSIS — I4891 Unspecified atrial fibrillation: Secondary | ICD-10-CM | POA: Diagnosis not present

## 2013-02-06 DIAGNOSIS — H548 Legal blindness, as defined in USA: Secondary | ICD-10-CM | POA: Diagnosis not present

## 2013-02-06 DIAGNOSIS — N184 Chronic kidney disease, stage 4 (severe): Secondary | ICD-10-CM | POA: Diagnosis not present

## 2013-02-08 ENCOUNTER — Telehealth: Payer: Self-pay | Admitting: *Deleted

## 2013-02-08 ENCOUNTER — Ambulatory Visit (INDEPENDENT_AMBULATORY_CARE_PROVIDER_SITE_OTHER): Payer: Medicare Other | Admitting: Pharmacist

## 2013-02-08 DIAGNOSIS — I509 Heart failure, unspecified: Secondary | ICD-10-CM | POA: Diagnosis not present

## 2013-02-08 DIAGNOSIS — I5031 Acute diastolic (congestive) heart failure: Secondary | ICD-10-CM | POA: Diagnosis not present

## 2013-02-08 DIAGNOSIS — Z7901 Long term (current) use of anticoagulants: Secondary | ICD-10-CM | POA: Diagnosis not present

## 2013-02-08 DIAGNOSIS — I4891 Unspecified atrial fibrillation: Secondary | ICD-10-CM | POA: Diagnosis not present

## 2013-02-08 DIAGNOSIS — H548 Legal blindness, as defined in USA: Secondary | ICD-10-CM | POA: Diagnosis not present

## 2013-02-08 DIAGNOSIS — N184 Chronic kidney disease, stage 4 (severe): Secondary | ICD-10-CM | POA: Diagnosis not present

## 2013-02-08 LAB — POCT INR: INR: 1.9

## 2013-02-08 NOTE — Telephone Encounter (Signed)
Patients daughter called to ask about whether or not patient should continue taking lasix 10mg  daily.  She was dc'd from hospital on 1/14 on this medication.  She has no edema, no SOB. Her weight has been stable at 126 lb.  Pt has appointment next Monday to see PA Richardson Dopp.  Instructed daughter that pt should continue with lasix unless she exhibits s/s of dehydration, including lower BP, dry mucus membranes, or if she has vomiting or diarrhea.  In that case she need to call us back.   Daughter reports patients po intake is good.  And she is drinking adequately and urinating as usual.  She also inquired about her taking probiotics, for which I referred her to primary care.

## 2013-02-09 DIAGNOSIS — H548 Legal blindness, as defined in USA: Secondary | ICD-10-CM | POA: Diagnosis not present

## 2013-02-09 DIAGNOSIS — I509 Heart failure, unspecified: Secondary | ICD-10-CM | POA: Diagnosis not present

## 2013-02-09 DIAGNOSIS — I4891 Unspecified atrial fibrillation: Secondary | ICD-10-CM | POA: Diagnosis not present

## 2013-02-09 DIAGNOSIS — N184 Chronic kidney disease, stage 4 (severe): Secondary | ICD-10-CM | POA: Diagnosis not present

## 2013-02-09 DIAGNOSIS — Z7901 Long term (current) use of anticoagulants: Secondary | ICD-10-CM | POA: Diagnosis not present

## 2013-02-09 DIAGNOSIS — I5031 Acute diastolic (congestive) heart failure: Secondary | ICD-10-CM | POA: Diagnosis not present

## 2013-02-10 DIAGNOSIS — Z7901 Long term (current) use of anticoagulants: Secondary | ICD-10-CM | POA: Diagnosis not present

## 2013-02-10 DIAGNOSIS — I4891 Unspecified atrial fibrillation: Secondary | ICD-10-CM | POA: Diagnosis not present

## 2013-02-10 DIAGNOSIS — I5031 Acute diastolic (congestive) heart failure: Secondary | ICD-10-CM | POA: Diagnosis not present

## 2013-02-10 DIAGNOSIS — H548 Legal blindness, as defined in USA: Secondary | ICD-10-CM | POA: Diagnosis not present

## 2013-02-10 DIAGNOSIS — I509 Heart failure, unspecified: Secondary | ICD-10-CM | POA: Diagnosis not present

## 2013-02-10 DIAGNOSIS — N184 Chronic kidney disease, stage 4 (severe): Secondary | ICD-10-CM | POA: Diagnosis not present

## 2013-02-10 NOTE — Telephone Encounter (Signed)
Pts daughter, Barbaraann Share, is advised to continue to monitor the pts weight and BP daily until her f/u with Richardson Dopp, PAC on 02/15/13. She verbalized understanding and states that she and her sister are monitoring the pt daily but that it is hard to monitor BP and weight at the same time everyday and that they will do their best (she states that they were unable to check the pt's BP at the recommended time this morning).

## 2013-02-10 NOTE — Telephone Encounter (Signed)
New problem   Saw pt on Monday and pt's family no longer want pt to continue using monitor. They need order for pt to discontinue using monitor. Please call

## 2013-02-11 ENCOUNTER — Telehealth: Payer: Self-pay | Admitting: Pharmacist

## 2013-02-11 NOTE — Telephone Encounter (Signed)
Message copied by Aris Georgia on Thu Feb 11, 2013  2:11 PM ------      Message from: Daneen Schick      Created: Wed Feb 10, 2013  9:51 PM       It is okay to stop.      ----- Message -----         From: Aris Georgia, [facility]         Sent: 02/08/2013   3:48 PM           To: Sinclair Grooms, MD            PT is followed by home health.  [name] is wanting to discontinue daily home vital and weight management machine that pt uses with [facility].  RN needs an order to take it out of the home.  Is this okay with you?        ------

## 2013-02-11 NOTE — Telephone Encounter (Signed)
Gave orders to [place] with [facility] that it would be okay to remove telemonitoring equipment.

## 2013-02-12 DIAGNOSIS — Z7901 Long term (current) use of anticoagulants: Secondary | ICD-10-CM | POA: Diagnosis not present

## 2013-02-12 DIAGNOSIS — H548 Legal blindness, as defined in USA: Secondary | ICD-10-CM | POA: Diagnosis not present

## 2013-02-12 DIAGNOSIS — I5031 Acute diastolic (congestive) heart failure: Secondary | ICD-10-CM | POA: Diagnosis not present

## 2013-02-12 DIAGNOSIS — I509 Heart failure, unspecified: Secondary | ICD-10-CM | POA: Diagnosis not present

## 2013-02-12 DIAGNOSIS — N184 Chronic kidney disease, stage 4 (severe): Secondary | ICD-10-CM | POA: Diagnosis not present

## 2013-02-12 DIAGNOSIS — I4891 Unspecified atrial fibrillation: Secondary | ICD-10-CM | POA: Diagnosis not present

## 2013-02-15 ENCOUNTER — Ambulatory Visit (INDEPENDENT_AMBULATORY_CARE_PROVIDER_SITE_OTHER): Payer: Medicare Other | Admitting: Pharmacist

## 2013-02-15 ENCOUNTER — Ambulatory Visit (INDEPENDENT_AMBULATORY_CARE_PROVIDER_SITE_OTHER): Payer: Medicare Other | Admitting: Physician Assistant

## 2013-02-15 ENCOUNTER — Encounter: Payer: Self-pay | Admitting: Physician Assistant

## 2013-02-15 VITALS — BP 140/80 | HR 61 | Ht 66.0 in | Wt 131.0 lb

## 2013-02-15 DIAGNOSIS — N184 Chronic kidney disease, stage 4 (severe): Secondary | ICD-10-CM

## 2013-02-15 DIAGNOSIS — Z7901 Long term (current) use of anticoagulants: Secondary | ICD-10-CM | POA: Diagnosis not present

## 2013-02-15 DIAGNOSIS — I509 Heart failure, unspecified: Secondary | ICD-10-CM | POA: Diagnosis not present

## 2013-02-15 DIAGNOSIS — I4891 Unspecified atrial fibrillation: Secondary | ICD-10-CM | POA: Diagnosis not present

## 2013-02-15 DIAGNOSIS — I5032 Chronic diastolic (congestive) heart failure: Secondary | ICD-10-CM | POA: Diagnosis not present

## 2013-02-15 DIAGNOSIS — I2699 Other pulmonary embolism without acute cor pulmonale: Secondary | ICD-10-CM

## 2013-02-15 DIAGNOSIS — H548 Legal blindness, as defined in USA: Secondary | ICD-10-CM | POA: Diagnosis not present

## 2013-02-15 DIAGNOSIS — I5031 Acute diastolic (congestive) heart failure: Secondary | ICD-10-CM | POA: Diagnosis not present

## 2013-02-15 LAB — POCT INR: INR: 1.8

## 2013-02-15 NOTE — Patient Instructions (Addendum)
LAB WORK TODAY; BMET  Your physician recommends that you schedule a follow-up appointment in: 2 [place]  NO CHANGES WITH MEDICATIONS

## 2013-02-15 NOTE — Progress Notes (Signed)
[address], [place], [place]  [address] Phone: [phone] Fax:  [phone]  Date:  02/15/2013   ID:  [name], DOB [birth year], MRN [id number]  PCP:  [name], MD  Cardiologist:  Dr. Daneen Schick  Electrophysiologist:  Dr. Cristopher Peru    History of Present Illness: [name] is a 80 y.o. female with a hx of paroxymal AFib controlled on Amiodarone and coumadin, CKD, prior DVT and pulmonary embolism, s/p IVC filter, temporal arteritis, hypothyroidism, spinal stenosis.  She was recently admitted 1/4-1/14 with small bowel obstruction complicated by recurrent AFib with RVR, pneumonia, volume overload.  She was seen by cardiology.  Amiodarone had been held but was restarted.  She was gently diuresed.  She converted to NSR and remained in NSR at d/c.  She was managed conservatively for SBO and improved.  She was also treated with antibiotics for her pneumonia.  Echo (01/23/2013):  Mild to mod LVH, EF 55-60%, no WMA, MAC, mild to mod LAE, mild RVE, mod RAE.    Since d/c, she is feeling well.  She denies chest pain, dyspnea, syncope, orthopnea, PND.  She has L>R LE edema that is chronic without changes.    Recent Labs: 01/18/2013: ALT 31  01/22/2013: Pro B Natriuretic peptide (BNP) 10497.0*  01/27/2013: Creatinine 1.31*; Hemoglobin 11.3*; Potassium 4.4   Wt Readings from Last 3 Encounters:  02/15/13 131 lb (59.421 kg)  01/27/13 128 lb 8 oz (58.287 kg)  12/02/12 130 lb (58.968 kg)     Past Medical History  Diagnosis Date  . Hypothyroidism   . Colitis   . Blood clot associated with vein wall inflammation   . Pulmonary embolus     and DVT s/p IVC filter  . Paroxysmal atrial fibrillation   . Blindness   . SBO (small bowel obstruction)   . Temporal arteritis   . Goiter     s/p radioactive iod. tx  . Spinal stenosis of lumbar region with radiculopathy     possible neurogenic claudication recently?  Marland Kitchen PAF (paroxysmal atrial fibrillation)   . CKD (chronic kidney  disease), stage IV     Current Outpatient Prescriptions  Medication Sig Dispense Refill  . amiodarone (PACERONE) 200 MG tablet TAKE (1/2) TABLET DAILY.  15 tablet  5  . Calcium Carb-Cholecalciferol (CALCIUM 600 + D) 600-200 MG-UNIT TABS Take 1 tablet by mouth 3 (three) times daily.      . furosemide (LASIX) 20 MG tablet Take 0.5 tablets (10 mg total) by mouth daily.  30 tablet  1  . levothyroxine (SYNTHROID, LEVOTHROID) 150 MCG tablet Take 150 mcg by mouth daily before breakfast.      . mesalamine (LIALDA) 1.2 G EC tablet Take 4.8 g by mouth daily with breakfast.       . Misc Natural Products (GLUCOSAMINE CHONDROITIN COMPLX) TABS Take 1 tablet by mouth 2 (two) times daily as needed (joint pain relief).      . Multiple Vitamins-Minerals (OCUVITE ADULT 50+ PO) Take 1 tablet by mouth daily.      Marland Kitchen saccharomyces boulardii (FLORASTOR) 250 MG capsule Take 1 capsule (250 mg total) by mouth 2 (two) times daily.  60 capsule  0  . warfarin (COUMADIN) 2.5 MG tablet Take 2.5 mg by mouth See admin instructions. On Mon, Fri take 1/2 tab (1.25mg ) and Tues, wedns, Thurs, Sat, Sun take 1 tab (2.5mg ).       No current facility-administered medications for this visit.    Allergies:  Cefdinir; Cortisone; Prednisone; and Sulfonamide derivatives   Social History:  The patient  reports that she has never smoked. She has never used smokeless tobacco. She reports that she does not drink alcohol or use illicit drugs.   Family History:  The patient's family history includes Breast cancer in her sister. There is no history of Crohn's disease or Ulcerative colitis.   ROS:  Please see the history of present illness.   No vomiting.  No cough.   All other systems reviewed and negative.   PHYSICAL EXAM: VS:  BP 140/80  Pulse 61  Ht 5\' 6"  (1.676 m)  Wt 131 lb (59.421 kg)  BMI 21.15 kg/m2 Well nourished, well developed, in no acute distress HEENT: normal Neck: no JVD Cardiac:  normal S1, S2; RRR; no murmur Lungs:   clear to auscultation bilaterally, no wheezing, rhonchi or rales Abd: soft, nontender, no hepatomegaly Ext: trace bilateral LE edema (L>R) Skin: warm and dry Neuro:  CNs 2-12 intact, no focal abnormalities noted  EKG:  NSR, HR 61, normal axis, inc RBBB, QTc 495     ASSESSMENT AND PLAN:  1. Atrial Fibrillation:  Maintaining NSR.  She remains on coumadin.  Continue Amiodarone.  Coumadin is managed in our coumadin clinic.  2. Chronic Diastolic CHF:  Volume is stable.  Check BMET today.  We discussed the importance of daily weights.  3. Chronic Kidney Disease:  As noted, repeat BMET today to follow renal fxn and K+.  4. Prior DVT and Pulmonary Embolism, s/p IVC Filter:  She remains on coumadin.   5. Disposition:  F/u with Dr. Daneen Schick in 2 mos.   Signed, Richardson Dopp, PA-C  02/15/2013 3:49 PM

## 2013-02-16 ENCOUNTER — Telehealth: Payer: Self-pay | Admitting: *Deleted

## 2013-02-16 DIAGNOSIS — I4891 Unspecified atrial fibrillation: Secondary | ICD-10-CM

## 2013-02-16 DIAGNOSIS — I509 Heart failure, unspecified: Secondary | ICD-10-CM | POA: Diagnosis not present

## 2013-02-16 DIAGNOSIS — Z7901 Long term (current) use of anticoagulants: Secondary | ICD-10-CM | POA: Diagnosis not present

## 2013-02-16 DIAGNOSIS — I5031 Acute diastolic (congestive) heart failure: Secondary | ICD-10-CM | POA: Diagnosis not present

## 2013-02-16 DIAGNOSIS — N184 Chronic kidney disease, stage 4 (severe): Secondary | ICD-10-CM | POA: Diagnosis not present

## 2013-02-16 DIAGNOSIS — H548 Legal blindness, as defined in USA: Secondary | ICD-10-CM | POA: Diagnosis not present

## 2013-02-16 LAB — BASIC METABOLIC PANEL
BUN: 29 mg/dL — ABNORMAL HIGH (ref 6–23)
CALCIUM: 9.3 mg/dL (ref 8.4–10.5)
CO2: 28 mEq/L (ref 19–32)
Chloride: 104 mEq/L (ref 96–112)
Creatinine, Ser: 1.3 mg/dL — ABNORMAL HIGH (ref 0.4–1.2)
GFR: 41.19 mL/min — AB (ref >60.00)
GLUCOSE: 100 mg/dL — AB (ref 70–99)
Potassium: 4.6 mEq/L (ref 3.5–5.1)
SODIUM: 139 meq/L (ref 135–145)

## 2013-02-16 NOTE — Telephone Encounter (Signed)
pt notified labs faxed to PCP today and we will get bmet 2/12 when she comes in for CVRR appt. Pt said ok and thank you.

## 2013-02-16 NOTE — Addendum Note (Signed)
Addended by: Michae Kava on: 02/16/2013 04:00 PM   Modules accepted: Orders

## 2013-02-16 NOTE — Telephone Encounter (Signed)
s/w pt today about her lab results. I stated to hold lasix and we will repeat bmet 02/25/13 when she comes in for her coumadin appt in our office. Pt said Dr. Moreen Fowler was going to get lab work 2/5 appt. I called PCP office and s/w woman who said it looks like she may get a bmp 2/5. I explained that we just got bmet yesterday 02/15/13 so she may not need a repeat 02/18/13. I will fax labs to PCP today for their review and pt's chart there as well.

## 2013-02-17 DIAGNOSIS — I4891 Unspecified atrial fibrillation: Secondary | ICD-10-CM | POA: Diagnosis not present

## 2013-02-17 DIAGNOSIS — I509 Heart failure, unspecified: Secondary | ICD-10-CM | POA: Diagnosis not present

## 2013-02-17 DIAGNOSIS — Z7901 Long term (current) use of anticoagulants: Secondary | ICD-10-CM | POA: Diagnosis not present

## 2013-02-17 DIAGNOSIS — I5031 Acute diastolic (congestive) heart failure: Secondary | ICD-10-CM | POA: Diagnosis not present

## 2013-02-17 DIAGNOSIS — H548 Legal blindness, as defined in USA: Secondary | ICD-10-CM | POA: Diagnosis not present

## 2013-02-17 DIAGNOSIS — N184 Chronic kidney disease, stage 4 (severe): Secondary | ICD-10-CM | POA: Diagnosis not present

## 2013-02-18 DIAGNOSIS — Z8719 Personal history of other diseases of the digestive system: Secondary | ICD-10-CM | POA: Diagnosis not present

## 2013-02-18 DIAGNOSIS — I10 Essential (primary) hypertension: Secondary | ICD-10-CM | POA: Diagnosis not present

## 2013-02-18 DIAGNOSIS — E039 Hypothyroidism, unspecified: Secondary | ICD-10-CM | POA: Diagnosis not present

## 2013-02-18 DIAGNOSIS — D649 Anemia, unspecified: Secondary | ICD-10-CM | POA: Diagnosis not present

## 2013-02-18 DIAGNOSIS — N183 Chronic kidney disease, stage 3 unspecified: Secondary | ICD-10-CM | POA: Diagnosis not present

## 2013-02-18 DIAGNOSIS — J189 Pneumonia, unspecified organism: Secondary | ICD-10-CM | POA: Diagnosis not present

## 2013-02-18 DIAGNOSIS — I509 Heart failure, unspecified: Secondary | ICD-10-CM | POA: Diagnosis not present

## 2013-02-18 DIAGNOSIS — E782 Mixed hyperlipidemia: Secondary | ICD-10-CM | POA: Diagnosis not present

## 2013-02-22 DIAGNOSIS — K5289 Other specified noninfective gastroenteritis and colitis: Secondary | ICD-10-CM | POA: Diagnosis not present

## 2013-02-22 DIAGNOSIS — R109 Unspecified abdominal pain: Secondary | ICD-10-CM | POA: Diagnosis not present

## 2013-02-25 ENCOUNTER — Other Ambulatory Visit (INDEPENDENT_AMBULATORY_CARE_PROVIDER_SITE_OTHER): Payer: Medicare Other

## 2013-02-25 ENCOUNTER — Ambulatory Visit (INDEPENDENT_AMBULATORY_CARE_PROVIDER_SITE_OTHER): Payer: Medicare Other | Admitting: Pharmacist

## 2013-02-25 DIAGNOSIS — I4891 Unspecified atrial fibrillation: Secondary | ICD-10-CM

## 2013-02-25 DIAGNOSIS — H353290 Exudative age-related macular degeneration, unspecified eye, stage unspecified: Secondary | ICD-10-CM | POA: Diagnosis not present

## 2013-02-25 DIAGNOSIS — H2510 Age-related nuclear cataract, unspecified eye: Secondary | ICD-10-CM | POA: Diagnosis not present

## 2013-02-25 DIAGNOSIS — H353190 Nonexudative age-related macular degeneration, unspecified eye, stage unspecified: Secondary | ICD-10-CM | POA: Diagnosis not present

## 2013-02-25 LAB — BASIC METABOLIC PANEL
BUN: 23 mg/dL (ref 6–23)
CALCIUM: 9.2 mg/dL (ref 8.4–10.5)
CO2: 28 mEq/L (ref 19–32)
Chloride: 107 mEq/L (ref 96–112)
Creatinine, Ser: 1.2 mg/dL (ref 0.4–1.2)
GFR: 45.21 mL/min — AB (ref >60.00)
Glucose, Bld: 143 mg/dL — ABNORMAL HIGH (ref 70–99)
Potassium: 4.4 mEq/L (ref 3.5–5.1)
SODIUM: 141 meq/L (ref 135–145)

## 2013-02-25 LAB — POCT INR: INR: 2.1

## 2013-02-26 ENCOUNTER — Telehealth: Payer: Self-pay | Admitting: *Deleted

## 2013-02-26 NOTE — Telephone Encounter (Signed)
Daughter Mardene Celeste notified about pt's lab results. Daughter gave verbal understanding to result. She asked does pt have to stay on lasix indefinite. I said can't answer that, that is a ? to ask Dr. [name] at 04/07/13 appt, daughter said ok and thank you

## 2013-03-03 ENCOUNTER — Ambulatory Visit: Payer: Medicare Other | Admitting: Podiatrist

## 2013-03-04 DIAGNOSIS — I4891 Unspecified atrial fibrillation: Secondary | ICD-10-CM | POA: Diagnosis not present

## 2013-03-04 DIAGNOSIS — I509 Heart failure, unspecified: Secondary | ICD-10-CM | POA: Diagnosis not present

## 2013-03-04 DIAGNOSIS — Z7901 Long term (current) use of anticoagulants: Secondary | ICD-10-CM | POA: Diagnosis not present

## 2013-03-04 DIAGNOSIS — H548 Legal blindness, as defined in USA: Secondary | ICD-10-CM | POA: Diagnosis not present

## 2013-03-04 DIAGNOSIS — N184 Chronic kidney disease, stage 4 (severe): Secondary | ICD-10-CM | POA: Diagnosis not present

## 2013-03-04 DIAGNOSIS — I5031 Acute diastolic (congestive) heart failure: Secondary | ICD-10-CM | POA: Diagnosis not present

## 2013-03-08 ENCOUNTER — Telehealth: Payer: Self-pay | Admitting: Interventional Cardiology

## 2013-03-08 NOTE — Telephone Encounter (Signed)
New Message  Jocelyn Lamer simmons//[facility] called. She states that there are 3 outstanding orders 2 for skilled nursing and 1 for Home health certification and plan of care.. Faxed on 02/23/2013.. Will refax if needed. Please call to confirm receipt of previous orders to prevent duplication

## 2013-03-11 ENCOUNTER — Ambulatory Visit: Payer: Medicare Other | Admitting: Podiatrist

## 2013-03-11 ENCOUNTER — Ambulatory Visit: Payer: Medicare Other | Admitting: Neurology

## 2013-03-16 ENCOUNTER — Ambulatory Visit (INDEPENDENT_AMBULATORY_CARE_PROVIDER_SITE_OTHER): Payer: Medicare Other | Admitting: Pharmacist

## 2013-03-16 ENCOUNTER — Telehealth: Payer: Self-pay | Admitting: Pharmacist

## 2013-03-16 DIAGNOSIS — I4891 Unspecified atrial fibrillation: Secondary | ICD-10-CM

## 2013-03-16 LAB — POCT INR: INR: 2

## 2013-03-16 NOTE — Telephone Encounter (Signed)
returned pt daughter call.pt will have cxr tghe same day as appt with Dr.Smith 04/07/13.Marland Kitchenpt is to have lab drawn with pcp and will have a copy of the results fwd to this office.pt daughter agreeable with plan and verbalized understanding.

## 2013-03-16 NOTE — Telephone Encounter (Signed)
Patient is scheduled to see Dr. [name] 04/07/13 for routine follow up.  Patient is scheduled to get TSH / LFTs and Chest X-ray on 04/14/13.  She has already got this arrange to have blood work done at Dr. Laqueta Linden office, but wants to know if she should get the chest xray done on 04/07/13 prior to seeing Dr. [name], or if she should wait until 04/14/13 as scheduled.  Please call daughter Mardene Celeste) at [phone].  Thanks.

## 2013-03-17 ENCOUNTER — Ambulatory Visit (INDEPENDENT_AMBULATORY_CARE_PROVIDER_SITE_OTHER): Payer: Medicare Other | Admitting: Podiatrist

## 2013-03-17 ENCOUNTER — Encounter: Payer: Self-pay | Admitting: Podiatrist

## 2013-03-17 VITALS — BP 126/55 | HR 73 | Resp 16

## 2013-03-17 DIAGNOSIS — L84 Corns and callosities: Secondary | ICD-10-CM

## 2013-03-17 DIAGNOSIS — B351 Tinea unguium: Secondary | ICD-10-CM | POA: Diagnosis not present

## 2013-03-17 DIAGNOSIS — I872 Venous insufficiency (chronic) (peripheral): Secondary | ICD-10-CM

## 2013-03-17 DIAGNOSIS — M79609 Pain in unspecified limb: Secondary | ICD-10-CM

## 2013-03-17 NOTE — Progress Notes (Signed)
  HPI:  Patient presents today for follow up of foot and nail care. Denies any new complaints today.  Objective:  Patients chart is reviewed.  Vascular status reveals decreased pedal pulses noted at 1 out of 4 dp and pt bilateral,  variscosities present.  Neurological sensation is Normal to [employer] monofilament bilateral.  Patients nails are thickened, discolored, distrophic, friable and brittle with yellow-brown discoloration. A corn is present on her right 4th toe.  Hyperkeratotic tissue is present medial aspect of the left hallux and first metatarsal head. Patient subjectively relates they are painful with shoes and with ambulation of bilateral feet.  Assessment:  Symptomatic onychomycosis, painful corn at 4th interspace right, hyperkeratotic lesion left foot  Plan:  Discussed treatment options and alternatives.  The symptomatic toenails were debrided through manual an mechanical means without complication.  The fourth interspace was significantly tender to touch do to interdigital corn. The area was anesthetized under sterile technique with lidocaine and Marcaine plain. A thorough debridement of the lesion was then carried out. Some mild bleeding was noted which was cauterized with Lumigan and antibiotic ointment and a dressing was applied. She will watch the area for any signs of infection and call if any of these arise. She will continue to use her lambswool in the interim. Return appointment recommended at routine intervals of 3 months    Trudie Buckler, DPM

## 2013-03-18 ENCOUNTER — Ambulatory Visit (INDEPENDENT_AMBULATORY_CARE_PROVIDER_SITE_OTHER): Payer: Medicare Other | Admitting: Neurology

## 2013-03-18 ENCOUNTER — Encounter (INDEPENDENT_AMBULATORY_CARE_PROVIDER_SITE_OTHER): Payer: Self-pay

## 2013-03-18 ENCOUNTER — Encounter: Payer: Self-pay | Admitting: Neurology

## 2013-03-18 VITALS — BP 140/70 | HR 66 | Temp 96.8°F | Ht 65.0 in | Wt 132.0 lb

## 2013-03-18 DIAGNOSIS — H543 Unqualified visual loss, both eyes: Secondary | ICD-10-CM

## 2013-03-18 DIAGNOSIS — G252 Other specified forms of tremor: Secondary | ICD-10-CM

## 2013-03-18 DIAGNOSIS — R269 Unspecified abnormalities of gait and mobility: Secondary | ICD-10-CM | POA: Diagnosis not present

## 2013-03-18 DIAGNOSIS — G609 Hereditary and idiopathic neuropathy, unspecified: Secondary | ICD-10-CM

## 2013-03-18 DIAGNOSIS — R2689 Other abnormalities of gait and mobility: Secondary | ICD-10-CM

## 2013-03-18 DIAGNOSIS — H9190 Unspecified hearing loss, unspecified ear: Secondary | ICD-10-CM

## 2013-03-18 DIAGNOSIS — G250 Essential tremor: Secondary | ICD-10-CM | POA: Diagnosis not present

## 2013-03-18 NOTE — Progress Notes (Signed)
Subjective:    Patient ID: [name] is a 80 y.o. female.  HPI    Interim history:   Ms. Kamps is a very pleasant 80 year old right-handed woman with a complex underlying history of atrial fibrillation, thyroid disease, giant cell arteritis, DVT with PE, hysterectomy, status post [place] filter placement, spinal stenosis with surgery in 2000 twice, right knee replacement surgery in 2001, left knee replacement surgery in 2002, essential tremor, peripheral neuropathy, macular degeneration, hearing loss, who presents for followup consultation of her multifactorial gait disorder, secondary to peripheral neuropathy, degenerative spine disease, blindness, hearing loss, vertigo, and her essential tremor. She is accompanied by her daughter, [name], today. I first met her on 09/08/2012, at which time he felt she had remained fairly stable and I did not suggest any medication changes. My main concern was that she continued to live alone and I talked to her and her daughter about that. Because of her visual and hearing impairment, balance problems, and gait dysfunction and abnormal posture as well as her age I felt that she needed to start a discussion with her family about an alternative living situation. I advised her to use her walker at all times.  Today, her daughter reports, that Ms. Hokenson is good about using the walker. She was hospitalize for 10 days in January, from 01/17/13 to 01/27/13 for small bowel obstruction, which started with abdominal pain and nausea and vomiting, complicated by aspiration pneumonia, atrial fibrillation, pulmonary embolus, nausea and vomiting, microscopic colitis, leukocytosis, CKD (chronic kidney disease), stage IV. She had home health therapies and has a emergency call button. She continues to live alone. She has no caregiver at home on a daily basis, but her kids check with her frequently. She has help with cleaning. She cooks at the stove and occasionally uses a knife.  Her INR is stable.  She previously followed with Dr. Morene Antu and was last seen by Dr. Erling Cruz on 03/12/2012, at which time he felt that she was stable and that she was doing well living by herself. She no longer drives. She sees Dr. Hardin Negus for pain management. She has had bladder incontinence. She has a long-standing history of peripheral neuropathy. She has a 2 story home. 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 Two daughters are local and her other daughter lives 20 minutes away. She has a life alert button.   Her Past Medical History Is Significant For: Past Medical History  Diagnosis Date  . Hypothyroidism   . Colitis   . Blood clot associated with vein wall inflammation   . Pulmonary embolus     and DVT s/p IVC filter  . Paroxysmal atrial fibrillation   . Blindness   . SBO (small bowel obstruction)   . Temporal arteritis   . Goiter     s/p radioactive iod. tx  . Spinal stenosis of lumbar region with radiculopathy     possible neurogenic claudication recently?  Marland Kitchen PAF (paroxysmal atrial fibrillation)   . CKD (chronic kidney disease), stage IV     Her Past Surgical History Is Significant For: Past Surgical History  Procedure Laterality Date  . Back surgery  04/1998    spinal stenosis  . Back surgery  2000    Ruptured Disc  . Total knee arthroplasty Left 04/1999  . Total knee arthroplasty Right 02/2000  . Carpal tunnel release  2002  . Appendectomy  1940  . Partial hysterectomy    . Tonsillectomy  1934  . Breast biopsy      cysts in both breasts  . Ivc filter      Her Family History Is Significant For: Family History  Problem Relation Age of Onset  . Breast cancer Sister   . Crohn's disease Neg Hx   . Ulcerative colitis Neg Hx     Her Social History Is Significant For: History   Social History  . Marital Status: Widowed    Spouse Name: N/A    Number of Children: 4  . Years of Education: College   Occupational History  .       retired   Social History Main Topics  . Smoking status: Never Smoker   . Smokeless tobacco: Never Used  . Alcohol Use: No  . Drug Use: No  . Sexual Activity: No   Other Topics Concern  . None   Social History Narrative   Patient lives at home alone.  Use walker to get around   Caffeine Use: rarely   Patient is right handed    Her Allergies Are:  Allergies  Allergen Reactions  . Cefdinir     Caused bad diarrhea  . Cortisone     Flushed hands and face   . Prednisone     cuased blood clots  . Sulfonamide Derivatives     Stomach pains and cramping   :   Her Current Medications Are:  Outpatient Encounter Prescriptions as of 03/18/2013  Medication Sig  . amiodarone (PACERONE) 200 MG tablet TAKE (1/2) TABLET DAILY.  . Calcium Carb-Cholecalciferol (CALCIUM 600 + D) 600-200 MG-UNIT TABS Take 1 tablet by mouth 3 (three) times daily.  . furosemide (LASIX) 20 MG tablet Take 0.5 tablets (10 mg total) by mouth daily.  Marland Kitchen levothyroxine (SYNTHROID, LEVOTHROID) 150 MCG tablet Take 150 mcg by mouth daily before breakfast.  . mesalamine (LIALDA) 1.2 G EC tablet Take 4.8 g by mouth daily with breakfast.   . Misc Natural Products (GLUCOSAMINE CHONDROITIN COMPLX) TABS Take 1 tablet by mouth 2 (two) times daily as needed (joint pain relief).  . Multiple Vitamins-Minerals (OCUVITE ADULT 50+ PO) Take 1 tablet by mouth daily.  Marland Kitchen saccharomyces boulardii (FLORASTOR) 250 MG capsule Take 1 capsule (250 mg total) by mouth 2 (two) times daily.  Marland Kitchen warfarin (COUMADIN) 2.5 MG tablet Take 2.5 mg by mouth See admin instructions. On Fri take 1/2 tab (1.39m)   Review of Systems:  Out of a complete 14 point review of systems, all are reviewed and negative with the exception of these symptoms as listed below:   Review of Systems  HENT: Positive for hearing loss.   Eyes:       Mascular degeneration-both eyes  Cardiovascular:       AFib  Genitourinary:       Incontinence of bladder  Musculoskeletal:  Positive for back pain.  Neurological: Positive for tremors.       Essential tremors  Hematological: Bruises/bleeds easily.    Objective:  Neurologic Exam  Physical Exam Physical Examination:   Filed Vitals:   03/18/13 1129  BP: 140/70  Pulse: 66  Temp: 96.8 F (36 C)   General Examination: The patient is a very pleasant 80y.o. female in no acute distress. She appears well-developed and well-nourished and well groomed.   HEENT: Normocephalic, atraumatic, pupils are equal, round and reactive to light and accommodation. Extraocular tracking is good without limitation to gaze excursion or nystagmus noted. Normal smooth pursuit is noted. Hearing is impaired with  hearing aid on the L. Face is symmetric with normal facial animation and normal facial sensation. Speech is clear with no dysarthria noted. There is no hypophonia. There is a lip, no-no type neck/head, and voice tremor, unchanged. Neck is supple with full range of passive and active motion. There are no carotid bruits on auscultation. Oropharynx exam reveals: mild mouth dryness, adequate dental hygiene and no significant airway crowding.  Chest: Clear to auscultation without wheezing, rhonchi or crackles noted.  Heart: S1+S2+0, regular and normal without murmurs, rubs or gallops noted.   Abdomen: Soft, non-tender and non-distended with normal bowel sounds appreciated on auscultation.  Extremities: There is no pitting edema in the distal lower extremities bilaterally. Pedal pulses are intact.  Skin: Warm and dry without trophic changes noted. There are no varicose veins.  Musculoskeletal: exam reveals no obvious joint deformities, tenderness or joint swelling or erythema.   Neurologically:  Mental status: The patient is awake, alert and oriented in all 4 spheres. Her memory, attention, language and knowledge are appropriate. There is no aphasia, agnosia, apraxia or anomia. Speech is clear with normal prosody and enunciation.  Thought process is linear. Mood is congruent and affect is normal.  Cranial nerves are as described above under HEENT exam. In addition, shoulder shrug is normal with equal shoulder height noted. Motor exam: Normal bulk, strength and tone is noted. There is no drift, tremor or rebound. Romberg is negative. Reflexes are 1+ throughout, absent in the knees and ankles. Toes are downgoing bilaterally. Fine motor skills are fairly well preserved.  Cerebellar testing shows no dysmetria or intention tremor on finger to nose testing. There is no truncal or gait ataxia.  Sensory exam is intact to light touch, pinprick, vibration, temperature sense in the upper extremities and decrease in vibration in the LEs.  Gait, station and balance: she stands with mild difficulty and pushes herself up. She leans to the R. Posture is mildly stooped for age. Stance is slightly wide-based. She walks independently for a few steps, she is slightly unsteady. Cannot do tandem walk. She wears a soft back brace around the lower back.   Assessment and Plan:   In summary, JAKALYN KRATKY is a very pleasant 80 year old female with a history of mild peripheral neuropathy, chronic degenerative spine disease, blindness, hearing loss, vertigo, and essential tremor and a gait disorder. She remains stable in her exam and I reassured the patient and her daughter in that regard. I had a long chat with the patient and her daughter again today about her living situation. She had a recent admission to the hospital for small bowel obstruction with complications and I reviewed the hospital records. My main concern remains the fact that she lives alone. I'm particularly concerned about her cooking at the stove. She is hearing in visually impaired and has balance problems, gait dysfunction, and abnormal posture and is elderly. I would like for her to have someone at the house on a day-to-day basis. She is again advised to use her walker at all times. I did  not suggest any new medications but asked her to stay well-hydrated. I will see her back routinely in 6 months from now, sooner if the need arises. I have encouraged him to call with any interim questions, concerns, problems or updates.

## 2013-03-18 NOTE — Patient Instructions (Signed)
We will continue with the walker at all times. I would recommend that you have help during the day for safety primarily.

## 2013-03-19 ENCOUNTER — Ambulatory Visit: Payer: Medicare Other | Admitting: Podiatrist

## 2013-03-24 DIAGNOSIS — I5031 Acute diastolic (congestive) heart failure: Secondary | ICD-10-CM | POA: Diagnosis not present

## 2013-03-24 DIAGNOSIS — Z7901 Long term (current) use of anticoagulants: Secondary | ICD-10-CM | POA: Diagnosis not present

## 2013-03-24 DIAGNOSIS — I4891 Unspecified atrial fibrillation: Secondary | ICD-10-CM | POA: Diagnosis not present

## 2013-03-24 DIAGNOSIS — I509 Heart failure, unspecified: Secondary | ICD-10-CM | POA: Diagnosis not present

## 2013-03-24 DIAGNOSIS — H548 Legal blindness, as defined in USA: Secondary | ICD-10-CM | POA: Diagnosis not present

## 2013-03-24 DIAGNOSIS — N184 Chronic kidney disease, stage 4 (severe): Secondary | ICD-10-CM | POA: Diagnosis not present

## 2013-04-01 ENCOUNTER — Encounter: Payer: Self-pay | Admitting: Interventional Cardiology

## 2013-04-01 DIAGNOSIS — E782 Mixed hyperlipidemia: Secondary | ICD-10-CM | POA: Diagnosis not present

## 2013-04-01 DIAGNOSIS — I509 Heart failure, unspecified: Secondary | ICD-10-CM | POA: Diagnosis not present

## 2013-04-01 DIAGNOSIS — I10 Essential (primary) hypertension: Secondary | ICD-10-CM | POA: Diagnosis not present

## 2013-04-01 DIAGNOSIS — E039 Hypothyroidism, unspecified: Secondary | ICD-10-CM | POA: Diagnosis not present

## 2013-04-01 DIAGNOSIS — D649 Anemia, unspecified: Secondary | ICD-10-CM | POA: Diagnosis not present

## 2013-04-01 DIAGNOSIS — I4891 Unspecified atrial fibrillation: Secondary | ICD-10-CM | POA: Diagnosis not present

## 2013-04-01 DIAGNOSIS — N183 Chronic kidney disease, stage 3 unspecified: Secondary | ICD-10-CM | POA: Diagnosis not present

## 2013-04-01 DIAGNOSIS — K5289 Other specified noninfective gastroenteritis and colitis: Secondary | ICD-10-CM | POA: Diagnosis not present

## 2013-04-07 ENCOUNTER — Ambulatory Visit (INDEPENDENT_AMBULATORY_CARE_PROVIDER_SITE_OTHER): Payer: Medicare Other | Admitting: Interventional Cardiology

## 2013-04-07 ENCOUNTER — Encounter: Payer: Self-pay | Admitting: Interventional Cardiology

## 2013-04-07 ENCOUNTER — Ambulatory Visit (INDEPENDENT_AMBULATORY_CARE_PROVIDER_SITE_OTHER): Payer: Medicare Other | Admitting: Pharmacist

## 2013-04-07 VITALS — BP 139/74 | HR 63 | Ht 65.0 in | Wt 131.1 lb

## 2013-04-07 DIAGNOSIS — Z79899 Other long term (current) drug therapy: Secondary | ICD-10-CM | POA: Diagnosis not present

## 2013-04-07 DIAGNOSIS — I4891 Unspecified atrial fibrillation: Secondary | ICD-10-CM

## 2013-04-07 DIAGNOSIS — N184 Chronic kidney disease, stage 4 (severe): Secondary | ICD-10-CM | POA: Diagnosis not present

## 2013-04-07 DIAGNOSIS — Z5181 Encounter for therapeutic drug level monitoring: Secondary | ICD-10-CM

## 2013-04-07 LAB — POCT INR: INR: 2.1

## 2013-04-07 NOTE — Patient Instructions (Addendum)
Your physician has recommended you make the following change in your medication:  1) STOP Furosemide  Take all other medications as prescribed  You have a follow up appointment scheduled for 07/12/13 @11am    A chest x-ray takes a picture of the organs and structures inside the chest, including the heart, lungs, and blood vessels. This test can show several things, including, whether the heart is enlarges; whether fluid is building up in the lungs; and whether pacemaker / defibrillator leads are still in place.( [facility] [address], to be done in June 2015 you can walk in you do not need an appt)  Please have a Tsh, and Hepatic panel drawn with your pcp and results faxed to [phone]

## 2013-04-07 NOTE — Progress Notes (Signed)
Patient ID: [name], female   DOB: Jul 14, 1920, 79 y.o.   MRN: [id number]    [address] [place], [place]  [address] Phone: [phone] Fax:  [phone]  Date:  04/07/2013   ID:  [name], DOB 1920-11-23, MRN [id number]  PCP:  Brittany Kroner, MD   ASSESSMENT:  1. Atrial fibrillation, paroxysmal 2. Amiodarone therapy, for suppression of A. Fib 3. Acute on chronic diastolic heart failure occurred while hospitalized related to fluid administration and stress of small bowel obstruction 4. Chronic anticoagulation therapy   PLAN:  1. We had a long conversation concerning the difference in patient care in this office versus her prior experience at [facility] cardiology. She is very dissatisfied with a multitude physician approach. She made this very clear to me. 2. No bleeding on Coumadin 3. No complications on amiodarone   SUBJECTIVE: [name] is a 80 y.o. female who recently had hospitalization for partial small bowel obstruction. He was then complicated by recurrence of atrial fibrillation and acute diastolic heart failure. Because of our clinical schedules with realignment, she never saw me during the hospital stay. She was concerned that she saw multiple cardiologists and was fearful that no one had her best interests and none. She is also very upset that when she calls she is not able to get to a familiar voice. She is upset that she doesn't see [name] every time she comes to Coumadin clinic as she did in the past. After stenting, she calmed down.  Her current medical regimen is unchanged. She has no acute cardiopulmonary complaints. She denies edema. She has not had syncope.   Wt Readings from Last 3 Encounters:  04/07/13 131 lb 1.9 oz (59.476 kg)  03/18/13 132 lb (59.875 kg)  02/15/13 131 lb (59.421 kg)     Past Medical History  Diagnosis Date  . Hypothyroidism   . Colitis   . Blood clot associated with vein wall inflammation   . Pulmonary embolus      and DVT s/p IVC filter  . Paroxysmal atrial fibrillation   . Blindness   . SBO (small bowel obstruction)   . Temporal arteritis   . Goiter     s/p radioactive iod. tx  . Spinal stenosis of lumbar region with radiculopathy     possible neurogenic claudication recently?  Marland Kitchen PAF (paroxysmal atrial fibrillation)   . CKD (chronic kidney disease), stage IV     Current Outpatient Prescriptions  Medication Sig Dispense Refill  . amiodarone (PACERONE) 200 MG tablet TAKE (1/2) TABLET DAILY.  15 tablet  5  . Calcium Carb-Cholecalciferol (CALCIUM 600 + D) 600-200 MG-UNIT TABS Take 1 tablet by mouth 3 (three) times daily.      . furosemide (LASIX) 20 MG tablet Take 0.5 tablets (10 mg total) by mouth daily.  30 tablet  1  . levothyroxine (SYNTHROID, LEVOTHROID) 150 MCG tablet Take 150 mcg by mouth daily before breakfast.      . mesalamine (LIALDA) 1.2 G EC tablet Take 4.8 g by mouth 3 (three) times daily.       . Misc Natural Products (GLUCOSAMINE CHONDROITIN COMPLX) TABS Take 1 tablet by mouth 2 (two) times daily.       . Multiple Vitamins-Minerals (OCUVITE ADULT 50+ PO) Take 1 tablet by mouth daily.      Marland Kitchen saccharomyces boulardii (FLORASTOR) 250 MG capsule Take 1 capsule (250 mg total) by mouth 2 (two) times daily.  60 capsule  0  .  warfarin (COUMADIN) 2.5 MG tablet Take 2.5 mg by mouth See admin instructions. On Fri take 1/2 tab (1.25mg )       No current facility-administered medications for this visit.    Allergies:    Allergies  Allergen Reactions  . Cefdinir     Caused bad diarrhea  . Cortisone     Flushed hands and face   . Prednisone     cuased blood clots  . Sulfonamide Derivatives     Stomach pains and cramping     Social History:  The patient  reports that she has never smoked. She has never used smokeless tobacco. She reports that she does not drink alcohol or use illicit drugs.   ROS:  Please see the history of present illness.   Appetite is been good. Weight is been  stable.   All other systems reviewed and negative.   OBJECTIVE: VS:  BP 139/74  Pulse 63  Ht 5\' 5"  (1.651 m)  Wt 131 lb 1.9 oz (59.476 kg)  BMI 21.82 kg/m2 Well nourished, well developed, in no acute distress, appears than her stated age HEENT: normal Neck: JVD flat. Carotid bruit absent  Cardiac:  normal S1, S2; RRR; no murmur Lungs:  clear to auscultation bilaterally, no wheezing, rhonchi or rales Abd: soft, nontender, no hepatomegaly Ext: Edema absent. Pulses 2+ bilateral Skin: warm and dry Neuro:  CNs 2-12 intact, no focal abnormalities noted  EKG:  Normal sinus rhythm       Signed, Brittany Labrador III, MD 04/07/2013 5:30 PM

## 2013-04-13 ENCOUNTER — Telehealth: Payer: Self-pay | Admitting: Interventional Cardiology

## 2013-04-13 NOTE — Telephone Encounter (Signed)
New message    Faxed plan of care to Dr [name] for 01-29-13 start date.  They have not received it.  Please sign and fax to [phone]

## 2013-04-14 ENCOUNTER — Telehealth: Payer: Self-pay | Admitting: Interventional Cardiology

## 2013-04-14 ENCOUNTER — Other Ambulatory Visit: Payer: Medicare Other

## 2013-04-14 NOTE — Telephone Encounter (Signed)
Betsy form [facility] came int to the office, face to face encounter was signed by Dr.Smtih

## 2013-04-14 NOTE — Telephone Encounter (Signed)
returned pt daughter call.adv her that an order for the labs Dr.Smith is requesting will be faxed to [facility] office next week when Dr.Smith returns to the office.pt daughter verbalized understanding

## 2013-04-14 NOTE — Telephone Encounter (Signed)
New Message  [name] called states that the pt's  [facility] Dr. Moreen Fowler is requesting that an order is faxed for labs. Please assist

## 2013-04-24 ENCOUNTER — Other Ambulatory Visit: Payer: Self-pay | Admitting: Interventional Cardiology

## 2013-04-28 DIAGNOSIS — K5289 Other specified noninfective gastroenteritis and colitis: Secondary | ICD-10-CM | POA: Diagnosis not present

## 2013-04-28 DIAGNOSIS — R932 Abnormal findings on diagnostic imaging of liver and biliary tract: Secondary | ICD-10-CM | POA: Diagnosis not present

## 2013-04-28 DIAGNOSIS — R109 Unspecified abdominal pain: Secondary | ICD-10-CM | POA: Diagnosis not present

## 2013-04-28 DIAGNOSIS — R1310 Dysphagia, unspecified: Secondary | ICD-10-CM | POA: Diagnosis not present

## 2013-04-29 ENCOUNTER — Telehealth: Payer: Self-pay | Admitting: Interventional Cardiology

## 2013-04-29 NOTE — Telephone Encounter (Signed)
Faxed with confirmation.

## 2013-04-29 NOTE — Telephone Encounter (Signed)
New message    Need lab order fax over to PCP Dr,. Marijo File # [facility]. Office # [phone]

## 2013-04-30 ENCOUNTER — Telehealth: Payer: Self-pay | Admitting: Interventional Cardiology

## 2013-04-30 NOTE — Telephone Encounter (Signed)
New message     Office calling     1.patient had lab drawn on 3/19.    2. Does patient need a repeat . Thyroid , hepatic panel.

## 2013-04-30 NOTE — Telephone Encounter (Signed)
pt daughter called in adv her that orders for labs (tsh,hepatic) that Dr.Smith is rqst in 3 mo were faxed to pt pcp.she will call them to explain that repeat labs are at [place] rqst, but pt prefers to have labs drawn at their office.

## 2013-05-05 ENCOUNTER — Ambulatory Visit (INDEPENDENT_AMBULATORY_CARE_PROVIDER_SITE_OTHER): Payer: Medicare Other | Admitting: Pharmacist

## 2013-05-05 DIAGNOSIS — I4891 Unspecified atrial fibrillation: Secondary | ICD-10-CM

## 2013-05-05 DIAGNOSIS — Z5181 Encounter for therapeutic drug level monitoring: Secondary | ICD-10-CM

## 2013-05-05 LAB — POCT INR: INR: 2

## 2013-05-27 ENCOUNTER — Other Ambulatory Visit: Payer: Self-pay | Admitting: Interventional Cardiology

## 2013-06-01 ENCOUNTER — Encounter: Payer: Self-pay | Admitting: Interventional Cardiology

## 2013-06-02 ENCOUNTER — Ambulatory Visit (INDEPENDENT_AMBULATORY_CARE_PROVIDER_SITE_OTHER): Payer: Medicare Other | Admitting: Pharmacist

## 2013-06-02 DIAGNOSIS — I4891 Unspecified atrial fibrillation: Secondary | ICD-10-CM

## 2013-06-02 DIAGNOSIS — Z5181 Encounter for therapeutic drug level monitoring: Secondary | ICD-10-CM

## 2013-06-02 LAB — POCT INR: INR: 2

## 2013-06-17 ENCOUNTER — Ambulatory Visit: Payer: Medicare Other | Admitting: Podiatrist

## 2013-06-24 ENCOUNTER — Encounter: Payer: Self-pay | Admitting: Podiatrist

## 2013-06-24 ENCOUNTER — Ambulatory Visit (INDEPENDENT_AMBULATORY_CARE_PROVIDER_SITE_OTHER): Payer: Medicare Other | Admitting: Podiatrist

## 2013-06-24 VITALS — BP 117/48 | HR 64 | Resp 14 | Ht 66.5 in | Wt 133.0 lb

## 2013-06-24 DIAGNOSIS — M79609 Pain in unspecified limb: Secondary | ICD-10-CM

## 2013-06-24 DIAGNOSIS — B351 Tinea unguium: Secondary | ICD-10-CM

## 2013-06-28 DIAGNOSIS — K5289 Other specified noninfective gastroenteritis and colitis: Secondary | ICD-10-CM | POA: Diagnosis not present

## 2013-06-28 DIAGNOSIS — R109 Unspecified abdominal pain: Secondary | ICD-10-CM | POA: Diagnosis not present

## 2013-06-29 NOTE — Progress Notes (Signed)
HPI: Patient presents today for follow up of foot and nail care. Denies any new complaints today.  Objective: Patients chart is reviewed. Vascular status reveals decreased pedal pulses noted at 1 out of 4 dp and pt bilateral, variscosities present. Neurological sensation is Normal to [employer] monofilament bilateral. Patients nails are thickened, discolored, distrophic, friable and brittle with yellow-brown discoloration. A corn is present on her right 4th toe. Hyperkeratotic tissue is present medial aspect of the left hallux and first metatarsal head. Patient subjectively relates they are painful with shoes and with ambulation of bilateral feet.  Assessment: Symptomatic onychomycosis, painful corn at 4th interspace right, hyperkeratotic lesion left foot  Plan: Discussed treatment options and alternatives. The symptomatic toenails were debrided through manual an mechanical means without complication. The fourth interspace was significantly tender to touch do to interdigital corn. The area was anesthetized under sterile technique with lidocaine and Marcaine plain. A thorough debridement of the lesion was then carried out. Some mild bleeding was noted which was cauterized with Lumigan and antibiotic ointment and a dressing was applied. She will watch the area for any signs of infection and call if any of these arise. She will continue to use her lambswool in the interim. Return appointment recommended at routine intervals of 3 months

## 2013-07-05 ENCOUNTER — Encounter: Payer: Self-pay | Admitting: Interventional Cardiology

## 2013-07-05 DIAGNOSIS — N183 Chronic kidney disease, stage 3 unspecified: Secondary | ICD-10-CM | POA: Diagnosis not present

## 2013-07-05 DIAGNOSIS — D649 Anemia, unspecified: Secondary | ICD-10-CM | POA: Diagnosis not present

## 2013-07-05 DIAGNOSIS — I10 Essential (primary) hypertension: Secondary | ICD-10-CM | POA: Diagnosis not present

## 2013-07-05 DIAGNOSIS — I4891 Unspecified atrial fibrillation: Secondary | ICD-10-CM | POA: Diagnosis not present

## 2013-07-05 DIAGNOSIS — E782 Mixed hyperlipidemia: Secondary | ICD-10-CM | POA: Diagnosis not present

## 2013-07-05 DIAGNOSIS — I509 Heart failure, unspecified: Secondary | ICD-10-CM | POA: Diagnosis not present

## 2013-07-05 DIAGNOSIS — E039 Hypothyroidism, unspecified: Secondary | ICD-10-CM | POA: Diagnosis not present

## 2013-07-05 DIAGNOSIS — K5289 Other specified noninfective gastroenteritis and colitis: Secondary | ICD-10-CM | POA: Diagnosis not present

## 2013-07-07 ENCOUNTER — Encounter: Payer: Self-pay | Admitting: Interventional Cardiology

## 2013-07-07 ENCOUNTER — Ambulatory Visit (INDEPENDENT_AMBULATORY_CARE_PROVIDER_SITE_OTHER): Payer: Medicare Other | Admitting: Interventional Cardiology

## 2013-07-07 ENCOUNTER — Telehealth: Payer: Self-pay

## 2013-07-07 ENCOUNTER — Ambulatory Visit (INDEPENDENT_AMBULATORY_CARE_PROVIDER_SITE_OTHER): Payer: Medicare Other | Admitting: *Deleted

## 2013-07-07 VITALS — BP 138/63 | HR 62 | Ht 65.0 in | Wt 132.0 lb

## 2013-07-07 DIAGNOSIS — I4891 Unspecified atrial fibrillation: Secondary | ICD-10-CM

## 2013-07-07 DIAGNOSIS — I480 Paroxysmal atrial fibrillation: Secondary | ICD-10-CM

## 2013-07-07 DIAGNOSIS — Z5181 Encounter for therapeutic drug level monitoring: Secondary | ICD-10-CM

## 2013-07-07 DIAGNOSIS — Z79899 Other long term (current) drug therapy: Secondary | ICD-10-CM | POA: Diagnosis not present

## 2013-07-07 DIAGNOSIS — I5032 Chronic diastolic (congestive) heart failure: Secondary | ICD-10-CM | POA: Diagnosis not present

## 2013-07-07 LAB — POCT INR: INR: 2.1

## 2013-07-07 NOTE — Patient Instructions (Signed)
Your physician recommends that you continue on your current medications as directed. Please refer to the Current Medication list given to you today.  A chest x-ray takes a picture of the organs and structures inside the chest, including the heart, lungs, and blood vessels. This test can show several things, including, whether the heart is enlarges; whether fluid is building up in the lungs; and whether pacemaker / defibrillator leads are still in place.(To be done in 6 months 12/2013)  Please have Dr.Swayne's office fax over your 6 month labs.  Your physician wants you to follow-up in: 6 months You will receive a reminder letter in the mail two months in advance. If you don't receive a letter, please call our office to schedule the follow-up appointment.

## 2013-07-07 NOTE — Telephone Encounter (Signed)
Message copied by Lamar Laundry on Wed Jul 07, 2013  2:45 PM ------      Message from: Daneen Schick      Created: Wed Jul 07, 2013  1:48 PM       Liver and thyroid are normal. ------

## 2013-07-07 NOTE — Progress Notes (Signed)
Patient ID: [name], female   DOB: Sep 01, 1920, 80 y.o.   MRN: [id number]    [address], [address] [place], [place]  [address] Phone: [phone] Fax:  [phone]  Date:  07/07/2013   ID:  [name], DOB 01-30-1920, MRN [id number]  PCP:  [name], MD   ASSESSMENT:  1. Paroxysmal atrial fibrillation 2. Chronic amiodarone therapy, without clinical evidence of toxicity  3. Chronic diastolic heart failure, asymptomatic  PLAN:  1. Continue current medical regimen 2. PA and lateral chest x-ray in 6 months 3. Office visit in 6 months   SUBJECTIVE: [name] is a 80 y.o. female who is doing well. She denies dyspnea. She has not had syncope. No prolonged palpitations. Appetite is stable.   Wt Readings from Last 3 Encounters:  07/07/13 132 lb (59.875 kg)  06/24/13 133 lb (60.328 kg)  04/07/13 131 lb 1.9 oz (59.476 kg)     Past Medical History  Diagnosis Date  . Hypothyroidism   . Colitis   . Blood clot associated with vein wall inflammation   . Pulmonary embolus     and DVT s/p IVC filter  . Paroxysmal atrial fibrillation   . Blindness   . SBO (small bowel obstruction)   . Temporal arteritis   . Goiter     s/p radioactive iod. tx  . Spinal stenosis of lumbar region with radiculopathy     possible neurogenic claudication recently?  Marland Kitchen PAF (paroxysmal atrial fibrillation)   . CKD (chronic kidney disease), stage IV     Current Outpatient Prescriptions  Medication Sig Dispense Refill  . amiodarone (PACERONE) 200 MG tablet TAKE (1/2) TABLET DAILY.  15 tablet  1  . Calcium Carb-Cholecalciferol (CALCIUM 600 + D) 600-200 MG-UNIT TABS Take 1 tablet by mouth 3 (three) times daily.      Marland Kitchen levothyroxine (SYNTHROID, LEVOTHROID) 150 MCG tablet Take 150 mcg by mouth daily before breakfast.      . mesalamine (LIALDA) 1.2 G EC tablet Take 4.8 g by mouth 3 (three) times daily.       . Misc Natural Products (GLUCOSAMINE CHONDROITIN COMPLX) TABS Take 1 tablet by  mouth 2 (two) times daily.       . Multiple Vitamins-Minerals (OCUVITE ADULT 50+ PO) Take 1 tablet by mouth daily.      Marland Kitchen warfarin (COUMADIN) 2.5 MG tablet 1 tablet every day except 1/2 tablet on Friday or as directed by coumadin clinic  35 tablet  3   No current facility-administered medications for this visit.    Allergies:    Allergies  Allergen Reactions  . Cefdinir     Caused bad diarrhea  . Cortisone     Flushed hands and face   . Prednisone     cuased blood clots  . Sulfasalazine     Nausea side effects  . Sulfonamide Derivatives     Stomach pains and cramping     Social History:  The patient  reports that she has never smoked. She has never used smokeless tobacco. She reports that she does not drink alcohol or use illicit drugs.   ROS:  Please see the history of present illness.      All other systems reviewed and negative.   OBJECTIVE: VS:  BP 138/63  Pulse 62  Ht 5\' 5"  (1.651 m)  Wt 132 lb (59.875 kg)  BMI 21.97 kg/m2 Well nourished, well developed, in no acute distress, elderly but younger than stated age  HEENT: normal Neck: JVD flat. Carotid bruit absent  Cardiac:  normal S1, S2; RRR; no murmur Lungs:  clear to auscultation bilaterally, no wheezing, rhonchi or rales Abd: soft, nontender, no hepatomegaly Ext: Edema absent. Pulses 2+ Skin: warm and dry Neuro:  CNs 2-12 intact, no focal abnormalities noted  EKG:  Not performed       Signed, Illene Labrador III, MD 07/07/2013 11:48 AM

## 2013-07-07 NOTE — Telephone Encounter (Signed)
pt aware of lab results. Liver and thyroid are normal. pt verbalized understanding.

## 2013-07-12 ENCOUNTER — Ambulatory Visit: Payer: Medicare Other | Admitting: Interventional Cardiology

## 2013-07-21 ENCOUNTER — Other Ambulatory Visit: Payer: Self-pay | Admitting: Interventional Cardiology

## 2013-08-17 ENCOUNTER — Ambulatory Visit (INDEPENDENT_AMBULATORY_CARE_PROVIDER_SITE_OTHER): Payer: Medicare Other | Admitting: *Deleted

## 2013-08-17 DIAGNOSIS — I4891 Unspecified atrial fibrillation: Secondary | ICD-10-CM

## 2013-08-17 DIAGNOSIS — Z5181 Encounter for therapeutic drug level monitoring: Secondary | ICD-10-CM | POA: Diagnosis not present

## 2013-08-17 LAB — POCT INR: INR: 2.3

## 2013-08-21 ENCOUNTER — Telehealth: Payer: Self-pay | Admitting: Cardiology

## 2013-08-21 NOTE — Telephone Encounter (Signed)
Forgot 2 days of coumadin while visiting daughter out of town. Called 2 days worth to local pharmacy.

## 2013-08-24 DIAGNOSIS — N390 Urinary tract infection, site not specified: Secondary | ICD-10-CM | POA: Diagnosis not present

## 2013-08-24 DIAGNOSIS — R300 Dysuria: Secondary | ICD-10-CM | POA: Diagnosis not present

## 2013-08-26 ENCOUNTER — Ambulatory Visit: Payer: Medicare Other | Admitting: Podiatrist

## 2013-09-02 ENCOUNTER — Ambulatory Visit (INDEPENDENT_AMBULATORY_CARE_PROVIDER_SITE_OTHER): Payer: Medicare Other | Admitting: Podiatrist

## 2013-09-02 DIAGNOSIS — L84 Corns and callosities: Secondary | ICD-10-CM

## 2013-09-02 DIAGNOSIS — M79609 Pain in unspecified limb: Secondary | ICD-10-CM | POA: Diagnosis not present

## 2013-09-02 DIAGNOSIS — B351 Tinea unguium: Secondary | ICD-10-CM | POA: Diagnosis not present

## 2013-09-02 DIAGNOSIS — I872 Venous insufficiency (chronic) (peripheral): Secondary | ICD-10-CM

## 2013-09-02 DIAGNOSIS — M79673 Pain in unspecified foot: Secondary | ICD-10-CM

## 2013-09-02 DIAGNOSIS — H353290 Exudative age-related macular degeneration, unspecified eye, stage unspecified: Secondary | ICD-10-CM | POA: Diagnosis not present

## 2013-09-03 ENCOUNTER — Ambulatory Visit (INDEPENDENT_AMBULATORY_CARE_PROVIDER_SITE_OTHER): Payer: Medicare Other | Admitting: Pharmacist

## 2013-09-03 DIAGNOSIS — Z5181 Encounter for therapeutic drug level monitoring: Secondary | ICD-10-CM

## 2013-09-03 DIAGNOSIS — I4891 Unspecified atrial fibrillation: Secondary | ICD-10-CM | POA: Diagnosis not present

## 2013-09-03 LAB — POCT INR: INR: 1.5

## 2013-09-06 NOTE — Progress Notes (Signed)
HPI: Patient presents today for follow up of foot and nail care. Denies any new complaints today. Presents today with her daughter.  Objective: Patients chart is reviewed. Vascular status reveals decreased pedal pulses noted at 1 out of 4 dp and pt bilateral, variscosities present. Neurological sensation is Normal to [employer] monofilament bilateral. Patients nails are thickened, discolored, distrophic, friable and brittle with yellow-brown discoloration. A corn is present on her right 4th toe. Hyperkeratotic tissue is present medial aspect of the left hallux and first metatarsal head. Patient subjectively relates they are painful with shoes and with ambulation of bilateral feet.   Assessment: Symptomatic onychomycosis, painful corn at 4th interspace right, hyperkeratotic lesion left foot   Plan: Discussed treatment options and alternatives. The symptomatic toenails were debrided through manual an mechanical means without complication.  thorough debridement of the 4th IS lesion wascarried out. She will contine to use her lambswool in the interim. Return appointment recommended at routine intervals of 3 months

## 2013-09-21 ENCOUNTER — Other Ambulatory Visit: Payer: Self-pay | Admitting: Interventional Cardiology

## 2013-09-21 ENCOUNTER — Ambulatory Visit (INDEPENDENT_AMBULATORY_CARE_PROVIDER_SITE_OTHER): Payer: Medicare Other | Admitting: *Deleted

## 2013-09-21 ENCOUNTER — Ambulatory Visit (INDEPENDENT_AMBULATORY_CARE_PROVIDER_SITE_OTHER): Payer: Medicare Other | Admitting: Neurology

## 2013-09-21 ENCOUNTER — Encounter: Payer: Self-pay | Admitting: Neurology

## 2013-09-21 VITALS — BP 138/64 | HR 60 | Temp 97.1°F | Ht 64.0 in | Wt 130.0 lb

## 2013-09-21 DIAGNOSIS — G609 Hereditary and idiopathic neuropathy, unspecified: Secondary | ICD-10-CM | POA: Diagnosis not present

## 2013-09-21 DIAGNOSIS — M545 Low back pain, unspecified: Secondary | ICD-10-CM

## 2013-09-21 DIAGNOSIS — I4891 Unspecified atrial fibrillation: Secondary | ICD-10-CM | POA: Diagnosis not present

## 2013-09-21 DIAGNOSIS — R269 Unspecified abnormalities of gait and mobility: Secondary | ICD-10-CM

## 2013-09-21 DIAGNOSIS — N390 Urinary tract infection, site not specified: Secondary | ICD-10-CM | POA: Diagnosis not present

## 2013-09-21 DIAGNOSIS — R2689 Other abnormalities of gait and mobility: Secondary | ICD-10-CM

## 2013-09-21 DIAGNOSIS — G250 Essential tremor: Secondary | ICD-10-CM

## 2013-09-21 DIAGNOSIS — H9190 Unspecified hearing loss, unspecified ear: Secondary | ICD-10-CM

## 2013-09-21 DIAGNOSIS — Z5181 Encounter for therapeutic drug level monitoring: Secondary | ICD-10-CM | POA: Diagnosis not present

## 2013-09-21 DIAGNOSIS — H9193 Unspecified hearing loss, bilateral: Secondary | ICD-10-CM

## 2013-09-21 DIAGNOSIS — G252 Other specified forms of tremor: Secondary | ICD-10-CM

## 2013-09-21 DIAGNOSIS — H543 Unqualified visual loss, both eyes: Secondary | ICD-10-CM | POA: Diagnosis not present

## 2013-09-21 LAB — POCT INR: INR: 2.2

## 2013-09-21 NOTE — Patient Instructions (Signed)
From my end of things you are stable. Have your primary care physician look at your neck swelling on the L.
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
## 2013-09-22 ENCOUNTER — Telehealth: Payer: Self-pay | Admitting: Interventional Cardiology

## 2013-09-22 NOTE — Telephone Encounter (Signed)
[name] called stating had been started on Doxycycline 100 mg bid for 7 days for UTI  Made an appt for her to have INR checked on Friday Sept 11th and pt's daughter states understanding.

## 2013-09-22 NOTE — Telephone Encounter (Signed)
New message     Pt is on doxycycline-hyclate 100mg --1 tab bid.  She is also on coumadin.  Please call with any dosage changes

## 2013-09-24 ENCOUNTER — Ambulatory Visit (INDEPENDENT_AMBULATORY_CARE_PROVIDER_SITE_OTHER): Payer: Medicare Other | Admitting: Pharmacist

## 2013-09-24 DIAGNOSIS — Z5181 Encounter for therapeutic drug level monitoring: Secondary | ICD-10-CM

## 2013-09-24 DIAGNOSIS — I4891 Unspecified atrial fibrillation: Secondary | ICD-10-CM

## 2013-09-24 LAB — POCT INR: INR: 1.9

## 2013-09-29 ENCOUNTER — Telehealth: Payer: Self-pay | Admitting: Interventional Cardiology

## 2013-09-29 NOTE — Telephone Encounter (Signed)
Spoke with [name] [facility]).  Penicillin okay with Coumadin.  Will not need to change her dose at this time.

## 2013-09-29 NOTE — Telephone Encounter (Signed)
New message      Pt is now on pencillian PV 500mg ---tid---for 7 days.  She is also on coumadin.  Will her dosage be changed?

## 2013-09-30 DIAGNOSIS — R3989 Other symptoms and signs involving the genitourinary system: Secondary | ICD-10-CM | POA: Diagnosis not present

## 2013-09-30 DIAGNOSIS — N393 Stress incontinence (female) (male): Secondary | ICD-10-CM | POA: Diagnosis not present

## 2013-09-30 DIAGNOSIS — K5289 Other specified noninfective gastroenteritis and colitis: Secondary | ICD-10-CM | POA: Diagnosis not present

## 2013-09-30 DIAGNOSIS — N3000 Acute cystitis without hematuria: Secondary | ICD-10-CM | POA: Diagnosis not present

## 2013-10-08 ENCOUNTER — Ambulatory Visit (INDEPENDENT_AMBULATORY_CARE_PROVIDER_SITE_OTHER): Payer: Medicare Other | Admitting: Pharmacist

## 2013-10-08 DIAGNOSIS — I4891 Unspecified atrial fibrillation: Secondary | ICD-10-CM | POA: Diagnosis not present

## 2013-10-08 DIAGNOSIS — Z5181 Encounter for therapeutic drug level monitoring: Secondary | ICD-10-CM | POA: Diagnosis not present

## 2013-10-08 LAB — POCT INR: INR: 2.5

## 2013-10-13 DIAGNOSIS — Z23 Encounter for immunization: Secondary | ICD-10-CM | POA: Diagnosis not present

## 2013-10-20 DIAGNOSIS — R3989 Other symptoms and signs involving the genitourinary system: Secondary | ICD-10-CM | POA: Diagnosis not present

## 2013-11-02 DIAGNOSIS — N393 Stress incontinence (female) (male): Secondary | ICD-10-CM | POA: Diagnosis not present

## 2013-11-02 DIAGNOSIS — R3989 Other symptoms and signs involving the genitourinary system: Secondary | ICD-10-CM | POA: Diagnosis not present

## 2013-11-04 ENCOUNTER — Ambulatory Visit (INDEPENDENT_AMBULATORY_CARE_PROVIDER_SITE_OTHER): Payer: Medicare Other | Admitting: Podiatrist

## 2013-11-04 ENCOUNTER — Ambulatory Visit (INDEPENDENT_AMBULATORY_CARE_PROVIDER_SITE_OTHER): Payer: Medicare Other | Admitting: Pharmacist Clinician (PhC)/ Clinical Pharmacy Specialist

## 2013-11-04 DIAGNOSIS — M79676 Pain in unspecified toe(s): Secondary | ICD-10-CM | POA: Diagnosis not present

## 2013-11-04 DIAGNOSIS — Z5181 Encounter for therapeutic drug level monitoring: Secondary | ICD-10-CM | POA: Diagnosis not present

## 2013-11-04 DIAGNOSIS — B351 Tinea unguium: Secondary | ICD-10-CM | POA: Diagnosis not present

## 2013-11-04 DIAGNOSIS — I4891 Unspecified atrial fibrillation: Secondary | ICD-10-CM

## 2013-11-04 LAB — POCT INR: INR: 2.3

## 2013-11-05 NOTE — Progress Notes (Signed)
HPI: Patient presents today for follow up of foot and nail care. Denies any new complaints today. Presents today with her daughter.  Objective: Patients chart is reviewed. Vascular status reveals decreased pedal pulses noted at 1 out of 4 dp and pt bilateral, variscosities present. Neurological sensation is Normal to [employer] monofilament bilateral. Patients nails are thickened, discolored, distrophic, friable and brittle with yellow-brown discoloration. A corn is present on her right 4th toe. Hyperkeratotic tissue is present medial aspect of the left hallux and first metatarsal head. Patient subjectively relates they are painful with shoes and with ambulation of bilateral feet.  Assessment: Symptomatic onychomycosis, painful corn at 4th interspace right, hyperkeratotic lesion left foot  Plan: Discussed treatment options and alternatives. The symptomatic toenails were debrided through manual an mechanical means without complication. thorough debridement of the 4th IS lesion wascarried out. She will contine to use her lambswool in the interim. Return appointment recommended at routine intervals of 3 months

## 2013-12-02 ENCOUNTER — Ambulatory Visit (INDEPENDENT_AMBULATORY_CARE_PROVIDER_SITE_OTHER): Payer: Medicare Other

## 2013-12-02 DIAGNOSIS — Z5181 Encounter for therapeutic drug level monitoring: Secondary | ICD-10-CM | POA: Diagnosis not present

## 2013-12-02 DIAGNOSIS — I4891 Unspecified atrial fibrillation: Secondary | ICD-10-CM

## 2013-12-02 LAB — POCT INR: INR: 1.9

## 2013-12-06 DIAGNOSIS — H903 Sensorineural hearing loss, bilateral: Secondary | ICD-10-CM | POA: Diagnosis not present

## 2013-12-20 ENCOUNTER — Other Ambulatory Visit: Payer: Self-pay | Admitting: Interventional Cardiology

## 2013-12-28 ENCOUNTER — Encounter: Payer: Self-pay | Admitting: Interventional Cardiology

## 2013-12-28 DIAGNOSIS — I509 Heart failure, unspecified: Secondary | ICD-10-CM | POA: Diagnosis not present

## 2013-12-28 DIAGNOSIS — N183 Chronic kidney disease, stage 3 (moderate): Secondary | ICD-10-CM | POA: Diagnosis not present

## 2013-12-28 DIAGNOSIS — D649 Anemia, unspecified: Secondary | ICD-10-CM | POA: Diagnosis not present

## 2013-12-28 DIAGNOSIS — M542 Cervicalgia: Secondary | ICD-10-CM | POA: Diagnosis not present

## 2013-12-28 DIAGNOSIS — K529 Noninfective gastroenteritis and colitis, unspecified: Secondary | ICD-10-CM | POA: Diagnosis not present

## 2013-12-28 DIAGNOSIS — E782 Mixed hyperlipidemia: Secondary | ICD-10-CM | POA: Diagnosis not present

## 2013-12-28 DIAGNOSIS — Z23 Encounter for immunization: Secondary | ICD-10-CM | POA: Diagnosis not present

## 2013-12-28 DIAGNOSIS — Z1389 Encounter for screening for other disorder: Secondary | ICD-10-CM | POA: Diagnosis not present

## 2013-12-28 DIAGNOSIS — E039 Hypothyroidism, unspecified: Secondary | ICD-10-CM | POA: Diagnosis not present

## 2013-12-28 DIAGNOSIS — I4891 Unspecified atrial fibrillation: Secondary | ICD-10-CM | POA: Diagnosis not present

## 2013-12-29 ENCOUNTER — Ambulatory Visit (INDEPENDENT_AMBULATORY_CARE_PROVIDER_SITE_OTHER): Payer: Medicare Other | Admitting: Interventional Cardiology

## 2013-12-29 ENCOUNTER — Encounter: Payer: Self-pay | Admitting: Interventional Cardiology

## 2013-12-29 ENCOUNTER — Ambulatory Visit (INDEPENDENT_AMBULATORY_CARE_PROVIDER_SITE_OTHER): Payer: Medicare Other | Admitting: *Deleted

## 2013-12-29 VITALS — BP 106/54 | HR 60 | Ht 64.0 in | Wt 132.8 lb

## 2013-12-29 DIAGNOSIS — Z5181 Encounter for therapeutic drug level monitoring: Secondary | ICD-10-CM | POA: Diagnosis not present

## 2013-12-29 DIAGNOSIS — I4891 Unspecified atrial fibrillation: Secondary | ICD-10-CM | POA: Diagnosis not present

## 2013-12-29 DIAGNOSIS — I480 Paroxysmal atrial fibrillation: Secondary | ICD-10-CM | POA: Diagnosis not present

## 2013-12-29 DIAGNOSIS — I5032 Chronic diastolic (congestive) heart failure: Secondary | ICD-10-CM

## 2013-12-29 LAB — POCT INR: INR: 2.2

## 2013-12-29 NOTE — Patient Instructions (Signed)
Your physician recommends that you continue on your current medications as directed. Please refer to the Current Medication list given to you today.  Your physician wants you to follow-up in: 6 months. You will receive a reminder letter in the mail two months in advance. If you don't receive a letter, please call our office to schedule the follow-up appointment.  

## 2013-12-29 NOTE — Progress Notes (Signed)
Patient ID: [name], female   DOB: Oct 02, 1920, 80 y.o.   MRN: [id number]    [address], [address] [place], [place]  [address] Phone: [phone] Fax:  [phone]  Date:  12/29/2013   ID:  [name], DOB 01-19-1920, MRN [id number]  PCP:  [name], MD   ASSESSMENT:  1. Paroxysmal atrial fibrillation, asymptomatic and felt to be suppressed with amiodarone 2. Chronic amiodarone therapy, 100 mg per day without clinical evidence of toxicity 3. Chronic diastolic heart failure, asymptomatic in absence of atrial fibrillation  PLAN:  1. Continue amiodarone 100 mg per day 2. We'll defer any further chest x-ray evaluation for amiodarone toxicity 3. Clinical follow-up with me in 6 months 4. Notified should call me if dyspnea, wheezing, decreased appetite, or episodes of atrial fib.   SUBJECTIVE: [name] is a 80 y.o. female who is asymptomatic with reference to heart. She has had no episodes of atrial fibrillation. Breathing is been stable. She has chronic low back discomfort. Appetite has been stable and weight is been stable. No transient neurological symptoms. No palpitations.   Wt Readings from Last 3 Encounters:  12/29/13 132 lb 12.8 oz (60.238 kg)  09/21/13 130 lb (58.968 kg)  07/07/13 132 lb (59.875 kg)     Past Medical History  Diagnosis Date  . Hypothyroidism   . Colitis   . Blood clot associated with vein wall inflammation   . Pulmonary embolus     and DVT s/p IVC filter  . Paroxysmal atrial fibrillation   . Blindness   . SBO (small bowel obstruction)   . Temporal arteritis   . Goiter     s/p radioactive iod. tx  . Spinal stenosis of lumbar region with radiculopathy     possible neurogenic claudication recently?  Marland Kitchen PAF (paroxysmal atrial fibrillation)   . CKD (chronic kidney disease), stage IV     Current Outpatient Prescriptions  Medication Sig Dispense Refill  . amiodarone (PACERONE) 200 MG tablet TAKE (1/2) TABLET DAILY. 15 tablet 5  .  Calcium Carb-Cholecalciferol (CALCIUM 600 + D) 600-200 MG-UNIT TABS Take 1 tablet by mouth 3 (three) times daily.    Marland Kitchen COUMADIN 2.5 MG tablet TAKE 1 TABLET DAILY EXCEPT 1/2 TABLET ON FRIDAY OR AS DIRECTED BY COUMADIN CLINIC. 35 tablet 3  . levothyroxine (SYNTHROID, LEVOTHROID) 150 MCG tablet Take 150 mcg by mouth daily before breakfast.    . mesalamine (LIALDA) 1.2 G EC tablet Take by mouth daily with breakfast. Take tabs 1 daily    . Misc Natural Products (GLUCOSAMINE CHONDROITIN COMPLX) TABS Take 1 tablet by mouth 2 (two) times daily.     . Multiple Vitamins-Minerals (OCUVITE ADULT 50+ PO) Take 1 tablet by mouth daily.     No current facility-administered medications for this visit.    Allergies:    Allergies  Allergen Reactions  . Cefdinir     Caused bad diarrhea  . Cortisone     Flushed hands and face   . Prednisone     cuased blood clots  . Sulfasalazine     Nausea side effects  . Sulfonamide Derivatives     Stomach pains and cramping     Social History:  The patient  reports that she has never smoked. She has never used smokeless tobacco. She reports that she does not drink alcohol or use illicit drugs.   ROS:  Please see the history of present illness.   No orthopnea, edema, PND, double vision, wheezing, or  hemoptysis   All other systems reviewed and negative.   OBJECTIVE: VS:  BP 106/54 mmHg  Pulse 60  Ht 5\' 4"  (1.626 m)  Wt 132 lb 12.8 oz (60.238 kg)  BMI 22.78 kg/m2 Well nourished, well developed, in no acute distress, appears in stated age 42: normal Neck: JVD flat. Carotid bruit absent  Cardiac:  normal S1, S2; RRR; no murmur Lungs:  clear to auscultation bilaterally, no wheezing, rhonchi or rales Abd: soft, nontender, no hepatomegaly Ext: Edema absent. Pulses 2+ Skin: warm and dry Neuro:  CNs 2-12 intact, no focal abnormalities noted  EKG:  Not performed       Signed, Illene Labrador III, MD 12/29/2013 3:41 PM

## 2014-01-20 ENCOUNTER — Encounter: Payer: Self-pay | Admitting: Podiatrist

## 2014-01-20 ENCOUNTER — Ambulatory Visit (INDEPENDENT_AMBULATORY_CARE_PROVIDER_SITE_OTHER): Payer: Medicare Other | Admitting: Podiatrist

## 2014-01-20 DIAGNOSIS — B351 Tinea unguium: Secondary | ICD-10-CM | POA: Diagnosis not present

## 2014-01-20 DIAGNOSIS — L84 Corns and callosities: Secondary | ICD-10-CM

## 2014-01-20 DIAGNOSIS — M79676 Pain in unspecified toe(s): Secondary | ICD-10-CM

## 2014-01-21 NOTE — Progress Notes (Signed)
HPI: Patient presents today for follow up of foot and nail care. Denies any new complaints today. Presents today with her daughter. He states the corn at the fourth interspace right is much improved. Objective: Patients chart is reviewed. Vascular status reveals decreased pedal pulses noted at 1 out of 4 dp and pt bilateral, variscosities present. Neurological sensation is Normal to [employer] monofilament bilateral. Patients nails are thickened, discolored, distrophic, friable and brittle with yellow-brown discoloration. A corn is present at the interspace on her on her right 4th toe. Hyperkeratotic tissue is present medial aspect of the left hallux and first metatarsal head. Patient subjectively relates they are painful with shoes and with ambulation of bilateral feet.  Assessment: Symptomatic onychomycosis, painful corn at 4th interspace right, hyperkeratotic lesion left foot  Plan: Discussed treatment options and alternatives. The symptomatic toenails were debrided through manual an mechanical means without complication. thorough debridement of the 4th IS lesion was carried out. She will contine to use her lambswool in the interim. Return appointment recommended at routine intervals of 3 months

## 2014-01-22 ENCOUNTER — Other Ambulatory Visit: Payer: Self-pay | Admitting: Interventional Cardiology

## 2014-01-26 ENCOUNTER — Ambulatory Visit (INDEPENDENT_AMBULATORY_CARE_PROVIDER_SITE_OTHER): Payer: Medicare Other | Admitting: *Deleted

## 2014-01-26 DIAGNOSIS — I480 Paroxysmal atrial fibrillation: Secondary | ICD-10-CM | POA: Diagnosis not present

## 2014-01-26 DIAGNOSIS — Z5181 Encounter for therapeutic drug level monitoring: Secondary | ICD-10-CM | POA: Diagnosis not present

## 2014-01-26 DIAGNOSIS — I4891 Unspecified atrial fibrillation: Secondary | ICD-10-CM

## 2014-01-26 LAB — POCT INR: INR: 2.1

## 2014-02-02 DIAGNOSIS — R937 Abnormal findings on diagnostic imaging of other parts of musculoskeletal system: Secondary | ICD-10-CM | POA: Diagnosis not present

## 2014-02-02 DIAGNOSIS — R0781 Pleurodynia: Secondary | ICD-10-CM | POA: Diagnosis not present

## 2014-02-02 DIAGNOSIS — W19XXXA Unspecified fall, initial encounter: Secondary | ICD-10-CM | POA: Diagnosis not present

## 2014-02-02 DIAGNOSIS — W1809XA Striking against other object with subsequent fall, initial encounter: Secondary | ICD-10-CM | POA: Diagnosis not present

## 2014-02-02 DIAGNOSIS — S20222A Contusion of left back wall of thorax, initial encounter: Secondary | ICD-10-CM | POA: Diagnosis not present

## 2014-02-23 ENCOUNTER — Ambulatory Visit (INDEPENDENT_AMBULATORY_CARE_PROVIDER_SITE_OTHER): Payer: Medicare Other | Admitting: *Deleted

## 2014-02-23 DIAGNOSIS — Z5181 Encounter for therapeutic drug level monitoring: Secondary | ICD-10-CM

## 2014-02-23 DIAGNOSIS — I480 Paroxysmal atrial fibrillation: Secondary | ICD-10-CM

## 2014-02-23 DIAGNOSIS — I4891 Unspecified atrial fibrillation: Secondary | ICD-10-CM

## 2014-02-23 LAB — POCT INR: INR: 2

## 2014-03-01 DIAGNOSIS — H3563 Retinal hemorrhage, bilateral: Secondary | ICD-10-CM | POA: Diagnosis not present

## 2014-03-01 DIAGNOSIS — H3532 Exudative age-related macular degeneration: Secondary | ICD-10-CM | POA: Diagnosis not present

## 2014-03-08 DIAGNOSIS — H3532 Exudative age-related macular degeneration: Secondary | ICD-10-CM | POA: Diagnosis not present

## 2014-03-09 ENCOUNTER — Ambulatory Visit (INDEPENDENT_AMBULATORY_CARE_PROVIDER_SITE_OTHER): Payer: Medicare Other | Admitting: *Deleted

## 2014-03-09 DIAGNOSIS — I4891 Unspecified atrial fibrillation: Secondary | ICD-10-CM | POA: Diagnosis not present

## 2014-03-09 DIAGNOSIS — I480 Paroxysmal atrial fibrillation: Secondary | ICD-10-CM | POA: Diagnosis not present

## 2014-03-09 DIAGNOSIS — Z5181 Encounter for therapeutic drug level monitoring: Secondary | ICD-10-CM | POA: Diagnosis not present

## 2014-03-09 LAB — POCT INR: INR: 2.6

## 2014-03-17 DIAGNOSIS — R51 Headache: Secondary | ICD-10-CM | POA: Diagnosis not present

## 2014-03-17 DIAGNOSIS — I4891 Unspecified atrial fibrillation: Secondary | ICD-10-CM | POA: Diagnosis not present

## 2014-03-17 DIAGNOSIS — I509 Heart failure, unspecified: Secondary | ICD-10-CM | POA: Diagnosis not present

## 2014-03-18 ENCOUNTER — Other Ambulatory Visit: Payer: Self-pay | Admitting: Family Medicine

## 2014-03-18 ENCOUNTER — Ambulatory Visit
Admission: RE | Admit: 2014-03-18 | Discharge: 2014-03-18 | Disposition: A | Discharge status: Home / Self Care | Payer: Medicare Other | Source: Ambulatory Visit | Attending: Family Medicine | Admitting: Family Medicine

## 2014-03-18 DIAGNOSIS — R519 Headache, unspecified: Secondary | ICD-10-CM

## 2014-03-18 DIAGNOSIS — R51 Headache: Principal | ICD-10-CM

## 2014-03-24 ENCOUNTER — Ambulatory Visit: Payer: Medicare Other | Admitting: Podiatrist

## 2014-03-29 DIAGNOSIS — H2513 Age-related nuclear cataract, bilateral: Secondary | ICD-10-CM | POA: Diagnosis not present

## 2014-03-29 DIAGNOSIS — H3532 Exudative age-related macular degeneration: Secondary | ICD-10-CM | POA: Diagnosis not present

## 2014-03-29 DIAGNOSIS — H40013 Open angle with borderline findings, low risk, bilateral: Secondary | ICD-10-CM | POA: Diagnosis not present

## 2014-03-30 ENCOUNTER — Ambulatory Visit (INDEPENDENT_AMBULATORY_CARE_PROVIDER_SITE_OTHER): Payer: Medicare Other | Admitting: Podiatrist

## 2014-03-30 ENCOUNTER — Encounter: Payer: Self-pay | Admitting: Podiatrist

## 2014-03-30 DIAGNOSIS — M79673 Pain in unspecified foot: Secondary | ICD-10-CM | POA: Diagnosis not present

## 2014-03-30 DIAGNOSIS — B351 Tinea unguium: Secondary | ICD-10-CM

## 2014-03-31 ENCOUNTER — Ambulatory Visit (INDEPENDENT_AMBULATORY_CARE_PROVIDER_SITE_OTHER): Payer: Medicare Other | Admitting: *Deleted

## 2014-03-31 DIAGNOSIS — Z5181 Encounter for therapeutic drug level monitoring: Secondary | ICD-10-CM | POA: Diagnosis not present

## 2014-03-31 DIAGNOSIS — I4891 Unspecified atrial fibrillation: Secondary | ICD-10-CM

## 2014-03-31 DIAGNOSIS — I480 Paroxysmal atrial fibrillation: Secondary | ICD-10-CM

## 2014-03-31 LAB — POCT INR: INR: 2.5

## 2014-04-04 ENCOUNTER — Telehealth: Payer: Self-pay | Admitting: Neurology

## 2014-04-04 NOTE — Telephone Encounter (Signed)
Talked with daughter Mardene Celeste. For about a month, [name] has had pain and aching in jaw (sounds like it is radiating from both TMJ). Also has occasional pain in R side of head. Eye doctor denied any eye problems. PCP reports no findings in CT head or blood work. Do you want patient to come in for an appt?

## 2014-04-04 NOTE — Telephone Encounter (Signed)
Patient's daughter is calling for about her mother who for about a month has been having pain and aching in jaw and face and fleeting pain in right side of head that comes and goes. PCP did not find anything in blood work.  She also saw her eye doctor and nothing found. Could ger PCP suggesting she talk with her Neurologist.  Please call.

## 2014-04-04 NOTE — Telephone Encounter (Signed)
Not sure what to make of this. Jaw pain can be from heart issues too, unfortunately or from giant cell arteritis or temporal arteritis which she has a history of. I can see her in follow-up but it sounds like it could be a dental issue, a TMJ issue, heart issue, or flare up from her prior history of temporal arteritis. Please arrange for follow-up, if unavailable, she may be able to see a nurse practitioner and I can pop in then.

## 2014-04-05 ENCOUNTER — Ambulatory Visit (INDEPENDENT_AMBULATORY_CARE_PROVIDER_SITE_OTHER): Payer: Medicare Other | Admitting: Neurology

## 2014-04-05 ENCOUNTER — Encounter: Payer: Self-pay | Admitting: Neurology

## 2014-04-05 VITALS — BP 133/61 | HR 61 | Temp 97.8°F | Resp 14 | Ht 64.0 in | Wt 135.0 lb

## 2014-04-05 DIAGNOSIS — H540 Blindness, both eyes: Secondary | ICD-10-CM

## 2014-04-05 DIAGNOSIS — H9190 Unspecified hearing loss, unspecified ear: Secondary | ICD-10-CM | POA: Diagnosis not present

## 2014-04-05 DIAGNOSIS — R2689 Other abnormalities of gait and mobility: Secondary | ICD-10-CM | POA: Diagnosis not present

## 2014-04-05 DIAGNOSIS — G250 Essential tremor: Secondary | ICD-10-CM

## 2014-04-05 DIAGNOSIS — R51 Headache: Secondary | ICD-10-CM | POA: Diagnosis not present

## 2014-04-05 DIAGNOSIS — M2660 Temporomandibular joint disorder, unspecified: Secondary | ICD-10-CM

## 2014-04-05 DIAGNOSIS — R519 Headache, unspecified: Secondary | ICD-10-CM

## 2014-04-05 DIAGNOSIS — H9193 Unspecified hearing loss, bilateral: Secondary | ICD-10-CM

## 2014-04-05 DIAGNOSIS — R269 Unspecified abnormalities of gait and mobility: Secondary | ICD-10-CM | POA: Diagnosis not present

## 2014-04-05 DIAGNOSIS — M26609 Unspecified temporomandibular joint disorder, unspecified side: Secondary | ICD-10-CM

## 2014-04-05 DIAGNOSIS — H543 Unqualified visual loss, both eyes: Secondary | ICD-10-CM

## 2014-04-05 NOTE — Patient Instructions (Signed)
We will do blood work today.  We will do a brain MRI.   We will call you with the test results.

## 2014-04-05 NOTE — Telephone Encounter (Signed)
Talked to daughter and offered appt this afternoon

## 2014-04-05 NOTE — Progress Notes (Signed)
Subjective:    Patient ID: [name] is a 81 y.o. female.  HPI     Interim history:   [name] is a very pleasant 81 year old right-handed woman with a complex underlying history of atrial fibrillation, thyroid disease, giant cell arteritis, DVT with PE, hysterectomy, status post [place] filter placement, spinal stenosis with surgery in 2000 twice, right knee replacement surgery in 2001, left knee replacement surgery in 2002, essential tremor, peripheral neuropathy, macular degeneration, and hearing loss, who presents for followup consultation of her multifactorial gait disorder, secondary to peripheral neuropathy, degenerative spine disease, blindness, hearing loss, vertigo, and her essential tremor. She is accompanied by her daughter, [name], again today. I last saw her on 09/21/2013, at which time she reported having had a UTI. She had stayed with her daughter. She had not fallen. Nocturia became worse. She was complaining of leg pain. She presents for a sooner than scheduled appointment secondary to new onset jaw pain.  Today, 04/05/2014, the patient reports most of her symptoms herself. Her history is supplemented by her daughter. Essentially, for the past month she has had intermittent dull headache and some sharp shooting pains on the right side of her head. It seems to start in the back or base of her head and radiates forward, sometimes it starts in the jaw area and radiates to her face and reports. Symptoms are different from when she was diagnosed in the past with giant cell arteritis. Nevertheless, she saw her primary care physician who data head CT. She had a head CT on 03/18/2014: Mild patchy periventricular small vessel disease. No intracranial mass, hemorrhage, or acute appearing infarct. In addition, personally reviewed the images through the PACS system. She also reports having had a sedimentation rate and CRP checked both of which were negative for concern for temporal  arteritis. She saw her ophthalmologist who did not see any new problems explaining the situation. She has been diagnosed with wet macular degeneration and had injections into both eyes for treatment of this. This has been a new diagnosis. She has previously been diagnosed with dry macular degeneration. Her right side is worse. Symptoms coincidentally started after she had her eye injection, about one week after she had her right I injected. She had her left eye injected a week after the right. She has no new one-sided neurological symptoms such as weakness, numbness, tingling, no facial droop, she does have some joint pain in her TMJ on the right. She saw her dentist. She says he was not concerned. She was advised not to chew on hard foods and try to eat softer foods.  Previously:   I saw her on 03/18/2013, at which time her daughter reported that the patient was hospitalized for about 10 days in January 2015 for small bowel obstruction and symptoms consisting of abdominal pain, nausea and vomiting. Her hospitalization was complicated by aspiration pneumonia, A. fib, pulmonary embolus, nausea, vomiting, microscopic colitis, leukocytosis, CKD (chronic kidney disease), stage IV. She had home health therapies and has a emergency call button. She continues to live alone. She has no caregiver at home on a daily basis, but her kids check with her frequently. She has help with cleaning. She cooks at the stove and occasionally uses a knife. Her INR was stable. I talked to her and her daughter at length last time and I was particularly concerned about her continuing to live alone and working in the kitchen and at the stove because she is visually impaired and has had  balance problems, gait disorder, abnormal posture and I advised her to have someone at the house for help on a day-to-day basis. She was advised to use her walker at all times. I did not suggest any new medications.  I first met her on 09/08/2012, at which  time he felt she had remained fairly stable and I did not suggest any medication changes. My main concern was that she continued to live alone and I talked to her and her daughter about that. Because of her visual and hearing impairment, balance problems, and gait dysfunction and abnormal posture as well as her age I felt that she needed to start a discussion with her family about an alternative living situation. I advised her to use her walker at all times.   She previously followed with Dr. Morene Antu and was last seen by Dr. Erling Cruz on 03/12/2012, at which time he felt that she was stable and that she was doing well living by herself. She no longer drives. She sees Dr. Hardin Negus for pain management. She has had bladder incontinence. She has a long-standing history of peripheral neuropathy. She has a 2 story home. 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 One daughter is in [place], one in [place] and [name] is local and her son lives at [place]. She has a life alert button.    Her Past Medical History Is Significant For: Past Medical History  Diagnosis Date  . Hypothyroidism   . Colitis   . Blood clot associated with vein wall inflammation   . Pulmonary embolus     and DVT s/p IVC filter  . Paroxysmal atrial fibrillation   . Blindness   . SBO (small bowel obstruction)   . Temporal arteritis   . Goiter     s/p radioactive iod. tx  . Spinal stenosis of lumbar region with radiculopathy     possible neurogenic claudication recently?  Marland Kitchen PAF (paroxysmal atrial fibrillation)   . CKD (chronic kidney disease), stage IV     Her Past Surgical History Is Significant For: Past Surgical History  Procedure Laterality Date  . Back surgery  04/1998    spinal stenosis  . Back surgery  2000    Ruptured Disc  . Total knee arthroplasty Left 04/1999  . Total knee arthroplasty Right 02/2000  . Carpal tunnel release  2002  . Appendectomy  1940  . Partial hysterectomy    .  Tonsillectomy  1934  . Breast biopsy      cysts in both breasts  . Ivc filter      Her Family History Is Significant For: Family History  Problem Relation Age of Onset  . Breast cancer Sister   . Crohn's disease Neg Hx   . Ulcerative colitis Neg Hx     Her Social History Is Significant For: History   Social History  . Marital Status: Widowed    Spouse Name: N/A  . Number of Children: 4  . Years of Education: College   Occupational History  .      retired   Social History Main Topics  . Smoking status: Never Smoker   . Smokeless tobacco: Never Used  . Alcohol Use: No  . Drug Use: No  . Sexual Activity: No   Other Topics Concern  . None   Social History Narrative   Patient lives at home alone.  Use walker to get around   Caffeine Use: rarely   Patient  is right handed    Her Allergies Are:  Allergies  Allergen Reactions  . Cefdinir     Caused bad diarrhea  . Cortisone     Flushed hands and face   . Prednisone     cuased blood clots  . Sulfasalazine     Nausea side effects  . Sulfonamide Derivatives     Stomach pains and cramping   :   Her Current Medications Are:  Outpatient Encounter Prescriptions as of 04/05/2014  Medication Sig  . warfarin (COUMADIN) 2.5 MG tablet TAKE 1 TABLET DAILY EXCEPT 1/2 TABLET ON FRIDAY OR AS DIRECTED BY COUMADIN CLINIC.  Marland Kitchen amiodarone (PACERONE) 200 MG tablet TAKE (1/2) TABLET DAILY.  . Calcium Carb-Cholecalciferol (CALCIUM 600 + D) 600-200 MG-UNIT TABS Take 1 tablet by mouth 3 (three) times daily.  Marland Kitchen levothyroxine (SYNTHROID, LEVOTHROID) 150 MCG tablet Take 150 mcg by mouth daily before breakfast.  . mesalamine (LIALDA) 1.2 G EC tablet Take by mouth daily with breakfast. Take tabs 1 daily  . Misc Natural Products (GLUCOSAMINE CHONDROITIN COMPLX) TABS Take 1 tablet by mouth 2 (two) times daily.   . Multiple Vitamins-Minerals (OCUVITE ADULT 50+ PO) Take 1 tablet by mouth daily.  . [DISCONTINUED] COUMADIN 2.5 MG tablet TAKE 1  TABLET DAILY EXCEPT 1/2 TABLET ON FRIDAY OR AS DIRECTED BY COUMADIN CLINIC.  :  Review of Systems:  Out of a complete 14 point review of systems, all are reviewed and negative with the exception of these symptoms as listed below:  Review of Systems  Neurological:       Facial pain and occasional head pain for 3 weeks    Objective:  Neurologic Exam  Physical Exam Physical Examination:   There were no vitals filed for this visit.   General Examination: The patient is a very pleasant 81 y.o. female in no acute distress. She appears well-developed and well-nourished and well groomed.   HEENT: Normocephalic, atraumatic, pupils are equal, round and reactive to light and accommodation. She is visually impaired bilaterally. On TMJ palpation I did not appreciate any crepitation. Her temporal artery is not palpable on either side and pulses are fine. She's not tender in her temples. She hassoreness of her scalp. She has no facial rash. Extraocular tracking is good without limitation to gaze excursion or nystagmus noted. Normal smooth pursuit is noted. Hearing is impaired with hearing aid on the L. Face is symmetric with normal facial animation and normal facial sensation. Speech is clear with no dysarthria noted. There is no hypophonia. There is an unchanged lip, no-no type neck/head, and voice tremor, stable. Neck is supple with full range of passive and active motion. There are no carotid bruits on auscultation. Oropharynx exam reveals: mild mouth dryness, adequate dental hygiene and no significant airway crowding.  Chest: Clear to auscultation without wheezing, rhonchi or crackles noted.  Heart: S1+S2+0, regular and normal without murmurs, rubs or gallops noted.   Abdomen: Soft, non-tender and non-distended with normal bowel sounds appreciated on auscultation.  Extremities: There is no pitting edema in the distal lower extremities bilaterally. Pedal pulses are intact.  Skin: Warm and dry  without trophic changes noted. There are no varicose veins.  Musculoskeletal: exam reveals no obvious joint deformities, tenderness or joint swelling or erythema.   Neurologically:  Mental status: The patient is awake, alert and oriented in all 4 spheres. Her memory, attention, language and knowledge are appropriate. There is no aphasia, agnosia, apraxia or anomia. Speech is clear with normal prosody  and enunciation. Thought process is linear. Mood is congruent and affect is normal.  Cranial nerves are as described above under HEENT exam. In addition, shoulder shrug is normal with equal shoulder height noted. Motor exam: Normal bulk, strength and tone is noted. There is no drift, tremor or rebound. Romberg is negative. Reflexes are 1+ throughout, absent in the knees and ankles. Fine motor skills are fairly well preserved.  Cerebellar testing shows no dysmetria or intention tremor on finger to nose testing. There is no truncal or gait ataxia.  Sensory exam is intact to light touch, pinprick, vibration, temperature sense in the upper extremities and decreased to all modalities in the LEs, unchanged.    Gait, station and balance: she stands with mild difficulty and pushes herself up. She leans to the R. Posture is mildly stooped for age. Stance is slightly wide-based. She cannot do tandem walk. She wears a soft back brace around the lower back. She uses her rolling walker fairly well.  Assessment and Plan:   In summary, RAJA CAPUTI is a very pleasant 81 year old female with a history of peripheral neuropathy, chronic degenerative spine disease, blindness, hearing loss, vertigo, and essential tremor and a gait disorder who presents for an urgent appointment for new onset jaw pain and right-sided headache. She may have mild TMJ issues but I do not appreciate any crepitation or subluxation of her TMJ on the right. She has no symptoms in keeping with giant cell arteritis and had blood work 3 weeks ago with  sedimentation rate and CRP both reportedly normal. I will repeat these today just to make sure. We will do a brain MRI without contrast. She does have a history of chronic kidney disease and I do not think we should give contrast. Her gait disorder is stable. Her tremor is unchanged. We will call her with her MRI results and blood work results. In the past she was treated with prednisone but had a reaction to it. She developed a DVT and PE. I'm not sure how to explain her symptoms. She is advised to hydrate a little better. Symptoms started a week after she had her first eye injection into the right eye for wet macular degeneration. Her eye doctor did not feel symptoms were connected to eye injection. I am not sure how to explain her symptoms. She had a head CT which I reviewed. She was advised to talk to her dentist about a bite guard. Unless we have to move up her appointment we will keep that appointment in 6 months. We will keep them updated with her test results. I did not appreciate any new neurological signs on exam. I reassured her in that regard. I answered all her questions today and the patient and her daughter were in agreement. I spent 25 minutes in total face-to-face time with the patient, more than 50% of which was spent in counseling and coordination of care, reviewing test results, reviewing medication and discussing or reviewing the diagnosis of new onset TMJ dysfunction and R sided HA, prognosis and treatment options.

## 2014-04-06 ENCOUNTER — Telehealth: Payer: Self-pay

## 2014-04-06 LAB — COMPREHENSIVE METABOLIC PANEL
ALBUMIN: 4 g/dL (ref 3.2–4.6)
ALT: 16 IU/L (ref 0–32)
AST: 20 IU/L (ref 0–40)
Albumin/Globulin Ratio: 1.9 (ref 1.1–2.5)
Alkaline Phosphatase: 86 IU/L (ref 39–117)
BUN/Creatinine Ratio: 25 (ref 11–26)
BUN: 32 mg/dL (ref 10–36)
Bilirubin Total: 0.3 mg/dL (ref 0.0–1.2)
CALCIUM: 9.3 mg/dL (ref 8.7–10.3)
CO2: 24 mmol/L (ref 18–29)
CREATININE: 1.26 mg/dL — AB (ref 0.57–1.00)
Chloride: 101 mmol/L (ref 97–108)
GFR calc Af Amer: 43 mL/min/{1.73_m2} — ABNORMAL LOW (ref >59)
GFR calc non Af Amer: 37 mL/min/{1.73_m2} — ABNORMAL LOW (ref >59)
GLOBULIN, TOTAL: 2.1 g/dL (ref 1.5–4.5)
Glucose: 100 mg/dL — ABNORMAL HIGH (ref 65–99)
Potassium: 4.9 mmol/L (ref 3.5–5.2)
Sodium: 138 mmol/L (ref 134–144)
Total Protein: 6.1 g/dL (ref 6.0–8.5)

## 2014-04-06 LAB — SEDIMENTATION RATE: Sed Rate: 10 mm/hr (ref 0–40)

## 2014-04-06 LAB — C-REACTIVE PROTEIN: CRP: 6.5 mg/L — AB (ref 0.0–4.9)

## 2014-04-06 NOTE — Telephone Encounter (Signed)
-----   Message from Star Age, MD sent at 04/06/2014  9:19 AM EDT ----- Please call patient to or her daughter about her blood test results: Sedimentation rate was low at 10 which speaks clearly against giant cell arteritis or temporal arteritis. CRP was borderline at 6.5 but in isolation does not explain her headache. This can be elevated secondary to arthritis in her hands even or low back pain. Her kidney function is mildly impaired. Again, I would not do an MRI with contrast and therefore I ordered an MRI brain without contrast as discussed. No further action is required on these test at this time. Star Age, MD, PhD [facility])

## 2014-04-06 NOTE — Progress Notes (Signed)
Quick Note:  Please call patient to or her daughter about her blood test results: Sedimentation rate was low at 10 which speaks clearly against giant cell arteritis or temporal arteritis. CRP was borderline at 6.5 but in isolation does not explain her headache. This can be elevated secondary to arthritis in her hands even or low back pain. Her kidney function is mildly impaired. Again, I would not do an MRI with contrast and therefore I ordered an MRI brain without contrast as discussed. No further action is required on these test at this time. Star Age, MD, PhD [facility] ([facility])  ______

## 2014-04-06 NOTE — Telephone Encounter (Signed)
Talked to daughter, Mardene Celeste. She is aware of the results and states understanding. They have MRI scheduled.

## 2014-04-12 DIAGNOSIS — H3532 Exudative age-related macular degeneration: Secondary | ICD-10-CM | POA: Diagnosis not present

## 2014-04-14 DIAGNOSIS — K529 Noninfective gastroenteritis and colitis, unspecified: Secondary | ICD-10-CM | POA: Diagnosis not present

## 2014-04-14 DIAGNOSIS — Z8719 Personal history of other diseases of the digestive system: Secondary | ICD-10-CM | POA: Diagnosis not present

## 2014-04-14 NOTE — Progress Notes (Signed)
HPI: Patient presents today for follow up of foot and nail care. Denies any new complaints today. Presents today with her daughter. she states the corn at the fourth interspace right is much improved and she is having minimal pain at today's visit.  Objective: Patients chart is reviewed. Vascular status reveals decreased pedal pulses noted at 1 out of 4 dp and pt bilateral, variscosities present. Neurological sensation is Normal to [employer] monofilament bilateral. Patients nails are thickened, discolored, distrophic, friable and brittle with yellow-brown discoloration. A mild corn is present at the interspace on her on her right 4th toe. Hyperkeratotic tissue is present medial aspect of the left hallux and first metatarsal head. Patient subjectively relates they are painful with shoes and with ambulation of bilateral feet.   Assessment: Symptomatic onychomycosis, painful corn at 4th interspace right, hyperkeratotic lesion left foot   Plan: Discussed treatment options and alternatives. The symptomatic toenails were debrided through manual an mechanical means without complication. thorough debridement of the 4th IS lesion was carried out. She will contine to use her lambswool in the interim. Return appointment recommended at routine intervals of 3 months

## 2014-04-19 DIAGNOSIS — H3532 Exudative age-related macular degeneration: Secondary | ICD-10-CM | POA: Diagnosis not present

## 2014-04-21 DIAGNOSIS — N3946 Mixed incontinence: Secondary | ICD-10-CM | POA: Diagnosis not present

## 2014-04-26 ENCOUNTER — Ambulatory Visit
Admission: RE | Admit: 2014-04-26 | Discharge: 2014-04-26 | Disposition: A | Discharge status: Home / Self Care | Payer: Medicare Other | Source: Ambulatory Visit | Attending: Neurology | Admitting: Neurology

## 2014-04-26 DIAGNOSIS — H9193 Unspecified hearing loss, bilateral: Secondary | ICD-10-CM | POA: Diagnosis not present

## 2014-04-26 DIAGNOSIS — H543 Unqualified visual loss, both eyes: Secondary | ICD-10-CM

## 2014-04-26 DIAGNOSIS — R2689 Other abnormalities of gait and mobility: Secondary | ICD-10-CM

## 2014-04-26 DIAGNOSIS — G250 Essential tremor: Secondary | ICD-10-CM | POA: Diagnosis not present

## 2014-04-26 DIAGNOSIS — R519 Headache, unspecified: Secondary | ICD-10-CM

## 2014-04-26 DIAGNOSIS — R51 Headache: Secondary | ICD-10-CM | POA: Diagnosis not present

## 2014-04-26 DIAGNOSIS — H540 Blindness, both eyes: Secondary | ICD-10-CM | POA: Diagnosis not present

## 2014-04-26 DIAGNOSIS — M26609 Unspecified temporomandibular joint disorder, unspecified side: Secondary | ICD-10-CM

## 2014-04-27 NOTE — Progress Notes (Signed)
Quick Note:  Please call patient regarding the recent brain MRI: The brain scan showed a normal structure of the brain and no significant volume loss which we call atrophy. There were changes in the deeper structures of the brain, which we call white matter changes or microvascular changes. These were reported as mild in Her case. These are tiny white spots, that occur with time and are seen in a variety of conditions, including with normal aging, chronic hypertension, chronic headaches, especially migraine HAs, chronic diabetes, chronic hyperlipidemia. These are not strokes and no mass or lesion or contrast enhancement was seen which is reassuring. Again, there were no acute findings, such as a stroke, or mass or blood products. No further action is required on this test at this time, other than re-enforcing the importance of good blood pressure control, good cholesterol control, good blood sugar control, and weight management. Please remind patient to keep any upcoming appointments or tests and to call us with any interim questions, concerns, problems or updates. Thanks,  Brittany Age, MD, PhD    ______

## 2014-04-28 ENCOUNTER — Telehealth: Payer: Self-pay

## 2014-04-28 ENCOUNTER — Ambulatory Visit (INDEPENDENT_AMBULATORY_CARE_PROVIDER_SITE_OTHER): Payer: Medicare Other | Admitting: Pharmacist

## 2014-04-28 DIAGNOSIS — I4891 Unspecified atrial fibrillation: Secondary | ICD-10-CM

## 2014-04-28 DIAGNOSIS — M62838 Other muscle spasm: Secondary | ICD-10-CM | POA: Diagnosis not present

## 2014-04-28 DIAGNOSIS — M6281 Muscle weakness (generalized): Secondary | ICD-10-CM | POA: Diagnosis not present

## 2014-04-28 DIAGNOSIS — I480 Paroxysmal atrial fibrillation: Secondary | ICD-10-CM | POA: Diagnosis not present

## 2014-04-28 DIAGNOSIS — N3946 Mixed incontinence: Secondary | ICD-10-CM | POA: Diagnosis not present

## 2014-04-28 DIAGNOSIS — Z5181 Encounter for therapeutic drug level monitoring: Secondary | ICD-10-CM

## 2014-04-28 DIAGNOSIS — R278 Other lack of coordination: Secondary | ICD-10-CM | POA: Diagnosis not present

## 2014-04-28 LAB — POCT INR: INR: 2.1

## 2014-04-28 NOTE — Telephone Encounter (Signed)
-----   Message from Star Age, MD sent at 04/27/2014  6:17 PM EDT ----- Please call patient regarding the recent brain MRI: The brain scan showed a normal structure of the brain and no significant volume loss which we call atrophy. There were changes in the deeper structures of the brain, which we call white matter changes or microvascular changes. These were reported as mild in Her case. These are tiny white spots, that occur with time and are seen in a variety of conditions, including with normal aging, chronic hypertension, chronic headaches, especially migraine HAs, chronic diabetes, chronic hyperlipidemia. These are not strokes and no mass or lesion or contrast enhancement was seen which is reassuring. Again, there were no acute findings, such as a stroke, or mass or blood products. No further action is required on this test at this time, other than re-enforcing the importance of good blood pressure control, good cholesterol control, good blood sugar control, and weight management. Please remind patient to keep any upcoming appointments or tests and to call us with any interim questions, concerns, problems or updates. Thanks,  Star Age, MD, PhD

## 2014-04-28 NOTE — Telephone Encounter (Signed)
I spoke to daughter, Mardene Celeste. She is aware of the results and states that she understands. She just wanted to make you aware that Cailani is seeing a dentist that is working with her for TMJ pain.

## 2014-05-05 DIAGNOSIS — R278 Other lack of coordination: Secondary | ICD-10-CM | POA: Diagnosis not present

## 2014-05-05 DIAGNOSIS — M6281 Muscle weakness (generalized): Secondary | ICD-10-CM | POA: Diagnosis not present

## 2014-05-05 DIAGNOSIS — M62838 Other muscle spasm: Secondary | ICD-10-CM | POA: Diagnosis not present

## 2014-05-05 DIAGNOSIS — R3989 Other symptoms and signs involving the genitourinary system: Secondary | ICD-10-CM | POA: Diagnosis not present

## 2014-05-16 DIAGNOSIS — M62838 Other muscle spasm: Secondary | ICD-10-CM | POA: Diagnosis not present

## 2014-05-16 DIAGNOSIS — N3946 Mixed incontinence: Secondary | ICD-10-CM | POA: Diagnosis not present

## 2014-05-16 DIAGNOSIS — R278 Other lack of coordination: Secondary | ICD-10-CM | POA: Diagnosis not present

## 2014-05-16 DIAGNOSIS — M6281 Muscle weakness (generalized): Secondary | ICD-10-CM | POA: Diagnosis not present

## 2014-05-17 DIAGNOSIS — H3532 Exudative age-related macular degeneration: Secondary | ICD-10-CM | POA: Diagnosis not present

## 2014-05-24 DIAGNOSIS — M6281 Muscle weakness (generalized): Secondary | ICD-10-CM | POA: Diagnosis not present

## 2014-05-24 DIAGNOSIS — N3946 Mixed incontinence: Secondary | ICD-10-CM | POA: Diagnosis not present

## 2014-05-24 DIAGNOSIS — R278 Other lack of coordination: Secondary | ICD-10-CM | POA: Diagnosis not present

## 2014-05-24 DIAGNOSIS — M62838 Other muscle spasm: Secondary | ICD-10-CM | POA: Diagnosis not present

## 2014-05-24 DIAGNOSIS — H6121 Impacted cerumen, right ear: Secondary | ICD-10-CM | POA: Diagnosis not present

## 2014-05-25 ENCOUNTER — Other Ambulatory Visit: Payer: Self-pay | Admitting: Interventional Cardiology

## 2014-05-26 DIAGNOSIS — H3532 Exudative age-related macular degeneration: Secondary | ICD-10-CM | POA: Diagnosis not present

## 2014-05-30 DIAGNOSIS — M47812 Spondylosis without myelopathy or radiculopathy, cervical region: Secondary | ICD-10-CM | POA: Diagnosis not present

## 2014-06-01 DIAGNOSIS — M47812 Spondylosis without myelopathy or radiculopathy, cervical region: Secondary | ICD-10-CM | POA: Diagnosis not present

## 2014-06-02 DIAGNOSIS — M6281 Muscle weakness (generalized): Secondary | ICD-10-CM | POA: Diagnosis not present

## 2014-06-02 DIAGNOSIS — R278 Other lack of coordination: Secondary | ICD-10-CM | POA: Diagnosis not present

## 2014-06-02 DIAGNOSIS — M62838 Other muscle spasm: Secondary | ICD-10-CM | POA: Diagnosis not present

## 2014-06-02 DIAGNOSIS — N3946 Mixed incontinence: Secondary | ICD-10-CM | POA: Diagnosis not present

## 2014-06-02 DIAGNOSIS — R3989 Other symptoms and signs involving the genitourinary system: Secondary | ICD-10-CM | POA: Diagnosis not present

## 2014-06-07 DIAGNOSIS — M47812 Spondylosis without myelopathy or radiculopathy, cervical region: Secondary | ICD-10-CM | POA: Diagnosis not present

## 2014-06-09 ENCOUNTER — Ambulatory Visit (INDEPENDENT_AMBULATORY_CARE_PROVIDER_SITE_OTHER): Payer: Medicare Other

## 2014-06-09 DIAGNOSIS — I480 Paroxysmal atrial fibrillation: Secondary | ICD-10-CM

## 2014-06-09 DIAGNOSIS — I4891 Unspecified atrial fibrillation: Secondary | ICD-10-CM

## 2014-06-09 DIAGNOSIS — Z5181 Encounter for therapeutic drug level monitoring: Secondary | ICD-10-CM

## 2014-06-09 LAB — POCT INR: INR: 2.5

## 2014-06-14 DIAGNOSIS — M47812 Spondylosis without myelopathy or radiculopathy, cervical region: Secondary | ICD-10-CM | POA: Diagnosis not present

## 2014-06-16 DIAGNOSIS — R278 Other lack of coordination: Secondary | ICD-10-CM | POA: Diagnosis not present

## 2014-06-16 DIAGNOSIS — N3946 Mixed incontinence: Secondary | ICD-10-CM | POA: Diagnosis not present

## 2014-06-16 DIAGNOSIS — M62838 Other muscle spasm: Secondary | ICD-10-CM | POA: Diagnosis not present

## 2014-06-16 DIAGNOSIS — M6281 Muscle weakness (generalized): Secondary | ICD-10-CM | POA: Diagnosis not present

## 2014-06-30 DIAGNOSIS — M6281 Muscle weakness (generalized): Secondary | ICD-10-CM | POA: Diagnosis not present

## 2014-06-30 DIAGNOSIS — M62838 Other muscle spasm: Secondary | ICD-10-CM | POA: Diagnosis not present

## 2014-06-30 DIAGNOSIS — N3946 Mixed incontinence: Secondary | ICD-10-CM | POA: Diagnosis not present

## 2014-06-30 DIAGNOSIS — R278 Other lack of coordination: Secondary | ICD-10-CM | POA: Diagnosis not present

## 2014-06-30 DIAGNOSIS — H3532 Exudative age-related macular degeneration: Secondary | ICD-10-CM | POA: Diagnosis not present

## 2014-07-01 ENCOUNTER — Ambulatory Visit (INDEPENDENT_AMBULATORY_CARE_PROVIDER_SITE_OTHER): Payer: Medicare Other | Admitting: Podiatry

## 2014-07-01 ENCOUNTER — Encounter: Payer: Self-pay | Admitting: Podiatry

## 2014-07-01 DIAGNOSIS — D649 Anemia, unspecified: Secondary | ICD-10-CM | POA: Diagnosis not present

## 2014-07-01 DIAGNOSIS — K529 Noninfective gastroenteritis and colitis, unspecified: Secondary | ICD-10-CM | POA: Diagnosis not present

## 2014-07-01 DIAGNOSIS — Z8719 Personal history of other diseases of the digestive system: Secondary | ICD-10-CM | POA: Diagnosis not present

## 2014-07-01 DIAGNOSIS — M79676 Pain in unspecified toe(s): Secondary | ICD-10-CM | POA: Diagnosis not present

## 2014-07-01 DIAGNOSIS — E782 Mixed hyperlipidemia: Secondary | ICD-10-CM | POA: Diagnosis not present

## 2014-07-01 DIAGNOSIS — B351 Tinea unguium: Secondary | ICD-10-CM | POA: Diagnosis not present

## 2014-07-01 DIAGNOSIS — E039 Hypothyroidism, unspecified: Secondary | ICD-10-CM | POA: Diagnosis not present

## 2014-07-01 DIAGNOSIS — I4891 Unspecified atrial fibrillation: Secondary | ICD-10-CM | POA: Diagnosis not present

## 2014-07-01 DIAGNOSIS — N183 Chronic kidney disease, stage 3 (moderate): Secondary | ICD-10-CM | POA: Diagnosis not present

## 2014-07-01 DIAGNOSIS — I509 Heart failure, unspecified: Secondary | ICD-10-CM | POA: Diagnosis not present

## 2014-07-04 DIAGNOSIS — M62838 Other muscle spasm: Secondary | ICD-10-CM | POA: Diagnosis not present

## 2014-07-04 DIAGNOSIS — R278 Other lack of coordination: Secondary | ICD-10-CM | POA: Diagnosis not present

## 2014-07-04 DIAGNOSIS — M6281 Muscle weakness (generalized): Secondary | ICD-10-CM | POA: Diagnosis not present

## 2014-07-04 DIAGNOSIS — N3946 Mixed incontinence: Secondary | ICD-10-CM | POA: Diagnosis not present

## 2014-07-05 ENCOUNTER — Encounter: Payer: Self-pay | Admitting: Interventional Cardiology

## 2014-07-06 ENCOUNTER — Telehealth: Payer: Self-pay

## 2014-07-06 NOTE — Telephone Encounter (Signed)
-----   Message from Belva Crome, MD sent at 07/05/2014  2:03 PM EDT ----- Thyroid and liver are okay.

## 2014-07-06 NOTE — Telephone Encounter (Signed)
Pt daughter aware of lab results with verbal understanding.  Pt daughter sts that pt was suppose to see Dr.Smith this month, as she usually sees Dr.Smith every 6 months. Pt did have Amiodarone labs (tsh, hepatic drawn by her pcp. F/u appt scheduled with Dr.Smith on 8/9 Pt daughter appreciative of the assistance and verbalized understanding.

## 2014-07-06 NOTE — Progress Notes (Signed)
Patient ID: [name], female   DOB: 1920-08-29, 81 y.o.   MRN: [id number]  Subjective: 81 y.o.-year-old female returns the office today for painful, elongated, thickened toenails which she is unable to trim herself. Denies any redness or drainage around the nails.she also has a corn on the right 5th toe. Denies any acute changes since last appointment and no new complaints today. Denies any systemic complaints such as fevers, chills, nausea, vomiting.   Objective: AAO 3, NAD DP/PT pulses palpable 1/4 bilaterally, CRT less than 3 seconds Protective sensation intact with [employer] monofilament Nails hypertrophic, dystrophic, elongated, brittle, discolored 10. There is tenderness overlying the nails 1-5 bilaterally. There is no surrounding erythema or drainage along the nail sites. There is a small hyperkerotic lesion on the right 4th interspace. Subjectively it is much smaller than before. Upon debridement no underlying ulceration, drainage, or other signs of infection.  No open lesions or pre-ulcerative lesions are identified. No other areas of tenderness bilateral lower extremities. No overlying edema, erythema, increased warmth. No pain with calf compression, swelling, warmth, erythema.  Assessment: Patient presents with symptomatic onychomycosis; corn right 4th interspace  Plan: -Treatment options including alternatives, risks, complications were discussed -Nails sharply debrided *10 without complication/bleeding. -Hyperkerotic lesion right 4th interspace debrided without complications/bleeding.  -Discussed daily foot inspection. If there are any changes, to call the office immediately.  -Follow-up in 3 months or sooner if any problems are to arise. In the meantime, encouraged to call the office with any questions, concerns, changes symptoms.

## 2014-07-19 ENCOUNTER — Other Ambulatory Visit: Payer: Self-pay | Admitting: Interventional Cardiology

## 2014-07-19 DIAGNOSIS — M6281 Muscle weakness (generalized): Secondary | ICD-10-CM | POA: Diagnosis not present

## 2014-07-19 DIAGNOSIS — R3989 Other symptoms and signs involving the genitourinary system: Secondary | ICD-10-CM | POA: Diagnosis not present

## 2014-07-19 DIAGNOSIS — N3946 Mixed incontinence: Secondary | ICD-10-CM | POA: Diagnosis not present

## 2014-07-19 DIAGNOSIS — M62838 Other muscle spasm: Secondary | ICD-10-CM | POA: Diagnosis not present

## 2014-07-19 DIAGNOSIS — M545 Low back pain: Secondary | ICD-10-CM | POA: Diagnosis not present

## 2014-07-21 ENCOUNTER — Ambulatory Visit (INDEPENDENT_AMBULATORY_CARE_PROVIDER_SITE_OTHER): Payer: Medicare Other | Admitting: *Deleted

## 2014-07-21 DIAGNOSIS — I4891 Unspecified atrial fibrillation: Secondary | ICD-10-CM

## 2014-07-21 DIAGNOSIS — I480 Paroxysmal atrial fibrillation: Secondary | ICD-10-CM | POA: Diagnosis not present

## 2014-07-21 DIAGNOSIS — Z5181 Encounter for therapeutic drug level monitoring: Secondary | ICD-10-CM | POA: Diagnosis not present

## 2014-07-21 LAB — POCT INR: INR: 2.4

## 2014-08-02 DIAGNOSIS — M6281 Muscle weakness (generalized): Secondary | ICD-10-CM | POA: Diagnosis not present

## 2014-08-02 DIAGNOSIS — R278 Other lack of coordination: Secondary | ICD-10-CM | POA: Diagnosis not present

## 2014-08-02 DIAGNOSIS — M62838 Other muscle spasm: Secondary | ICD-10-CM | POA: Diagnosis not present

## 2014-08-02 DIAGNOSIS — N3946 Mixed incontinence: Secondary | ICD-10-CM | POA: Diagnosis not present

## 2014-08-16 DIAGNOSIS — M545 Low back pain: Secondary | ICD-10-CM | POA: Diagnosis not present

## 2014-08-16 DIAGNOSIS — M62838 Other muscle spasm: Secondary | ICD-10-CM | POA: Diagnosis not present

## 2014-08-16 DIAGNOSIS — M6281 Muscle weakness (generalized): Secondary | ICD-10-CM | POA: Diagnosis not present

## 2014-08-16 DIAGNOSIS — R278 Other lack of coordination: Secondary | ICD-10-CM | POA: Diagnosis not present

## 2014-08-18 DIAGNOSIS — H43813 Vitreous degeneration, bilateral: Secondary | ICD-10-CM | POA: Diagnosis not present

## 2014-08-18 DIAGNOSIS — H3532 Exudative age-related macular degeneration: Secondary | ICD-10-CM | POA: Diagnosis not present

## 2014-08-22 NOTE — Progress Notes (Signed)
And palpitations. No transient neurological complaints.    Cardiology Office Note   Date:  08/23/2014   ID:  [name], DOB 1920/06/28, MRN [id number]  PCP:  Brittany Kroner, MD  Cardiologist:  Brittany Grooms, MD   Chief Complaint  Patient presents with  . Atrial Fibrillation      History of Present Illness: [name] is a 81 y.o. female who presents for chronic diastolic heart failure, paroxysmal atrial fibrillation with rhythm control on amiodarone therapy, and significant chronic kidney disease stage IV.  The patient is doing well. She denies dyspnea, bleeding, syncope,  Past Medical History  Diagnosis Date  . Hypothyroidism   . Colitis   . Blood clot associated with vein wall inflammation   . Pulmonary embolus     and DVT s/p IVC filter  . Paroxysmal atrial fibrillation   . Blindness   . SBO (small bowel obstruction)   . Temporal arteritis   . Goiter     s/p radioactive iod. tx  . Spinal stenosis of lumbar region with radiculopathy     possible neurogenic claudication recently?  Marland Kitchen PAF (paroxysmal atrial fibrillation)   . CKD (chronic kidney disease), stage IV     Past Surgical History  Procedure Laterality Date  . Back surgery  04/1998    spinal stenosis  . Back surgery  2000    Ruptured Disc  . Total knee arthroplasty Left 04/1999  . Total knee arthroplasty Right 02/2000  . Carpal tunnel release  2002  . Appendectomy  1940  . Partial hysterectomy    . Tonsillectomy  1934  . Breast biopsy      cysts in both breasts  . Ivc filter       Current Outpatient Prescriptions  Medication Sig Dispense Refill  . amiodarone (PACERONE) 200 MG tablet Take 100 mg by mouth daily.    . Calcium Carb-Cholecalciferol (CALCIUM 600 + D) 600-200 MG-UNIT TABS Take 1 tablet by mouth 3 (three) times daily.    Marland Kitchen COUMADIN 2.5 MG tablet TAKE AS DIRECTED. 35 tablet 3  . levothyroxine (SYNTHROID, LEVOTHROID) 150 MCG tablet Take 150 mcg by mouth daily before breakfast.    . Misc  Natural Products (GLUCOSAMINE CHONDROITIN COMPLX) TABS Take 1 tablet by mouth 2 (two) times daily.     . Multiple Vitamins-Minerals (OCUVITE ADULT 50+ PO) Take 1 tablet by mouth daily.     No current facility-administered medications for this visit.    Allergies:   Cefdinir; Cortisone; Prednisone; Sulfasalazine; and Sulfonamide derivatives    Social History:  The patient  reports that she has never smoked. She has never used smokeless tobacco. She reports that she does not drink alcohol or use illicit drugs.   Family History:  The patient's family history includes Breast cancer in her sister. There is no history of Crohn's disease or Ulcerative colitis.    ROS:  Please see the history of present illness.   Otherwise, review of systems are positive for back discomfort, easy bruising, muscle aches, back pain, difficulty with balance, vision and hearing disturbance. Appetite is been great..   All other systems are reviewed and negative.    PHYSICAL EXAM: VS:  BP 120/60 mmHg  Pulse 60  Ht 5\' 4"  (1.626 m)  Wt 61.689 kg (136 lb)  BMI 23.33 kg/m2 , BMI Body mass index is 23.33 kg/(m^2). GEN: Well nourished, well developed, in no acute distress. Elderly but healthy appearing HEENT: normal Neck: no JVD, carotid bruits, or masses Cardiac:  RRR; no murmurs, rubs, or gallops,no edema  Respiratory:  clear to auscultation bilaterally, normal work of breathing GI: soft, nontender, nondistended, + BS MS: no deformity or atrophy Skin: warm and dry, no rash Neuro:  Strength and sensation are intact Psych: euthymic mood, full affect   EKG:  EKG is ordered today. The ekg ordered today demonstrates sinus rhythm with nonspecific T-wave abnormality and tall R-wave in V1. First-degree AV block is noted.   Recent Labs: 04/05/2014: ALT 16; BUN 32; Creatinine, Ser 1.26*; Potassium 4.9; Sodium 138    Lipid Panel No results found for: CHOL, TRIG, HDL, CHOLHDL, VLDL, LDLCALC, LDLDIRECT    Wt Readings  from Last 3 Encounters:  08/23/14 61.689 kg (136 lb)  04/05/14 61.236 kg (135 lb)  12/29/13 60.238 kg (132 lb 12.8 oz)      Other studies Reviewed: Additional studies/ records that were reviewed today include: . Review of the above records demonstrates:    ASSESSMENT AND PLAN:  1. Encounter for therapeutic drug monitoring No evidence of Amiodarone toxicity  2. Paroxysmal atrial fibrillation Stable with rhythm control on amio  3. Chronic diastolic heart failure No volume overload.  4. CKD (chronic kidney disease), stage IV No evaluated    Current medicines are reviewed at length with the patient today.  The patient does not have concerns regarding medicines.  The following changes have been made:  no change.  No change in current therapy and get CXR on next visit.  Labs/ tests ordered today include:  No orders of the defined types were placed in this encounter.     Disposition:   FU with HS in  PRN  Signed, Brittany Grooms, MD  08/23/2014 11:42 AM    [address], [place], [place]  [address] Phone: [phone]; Fax: [phone]

## 2014-08-23 ENCOUNTER — Ambulatory Visit (INDEPENDENT_AMBULATORY_CARE_PROVIDER_SITE_OTHER): Payer: Medicare Other | Admitting: Interventional Cardiology

## 2014-08-23 ENCOUNTER — Encounter: Payer: Self-pay | Admitting: Interventional Cardiology

## 2014-08-23 ENCOUNTER — Ambulatory Visit (INDEPENDENT_AMBULATORY_CARE_PROVIDER_SITE_OTHER): Payer: Medicare Other | Admitting: *Deleted

## 2014-08-23 VITALS — BP 120/60 | HR 60 | Ht 64.0 in | Wt 136.0 lb

## 2014-08-23 DIAGNOSIS — I4891 Unspecified atrial fibrillation: Secondary | ICD-10-CM

## 2014-08-23 DIAGNOSIS — I5032 Chronic diastolic (congestive) heart failure: Secondary | ICD-10-CM

## 2014-08-23 DIAGNOSIS — I480 Paroxysmal atrial fibrillation: Secondary | ICD-10-CM

## 2014-08-23 DIAGNOSIS — Z5181 Encounter for therapeutic drug level monitoring: Secondary | ICD-10-CM | POA: Diagnosis not present

## 2014-08-23 DIAGNOSIS — N184 Chronic kidney disease, stage 4 (severe): Secondary | ICD-10-CM | POA: Diagnosis not present

## 2014-08-23 LAB — POCT INR: INR: 1.7

## 2014-08-23 NOTE — Patient Instructions (Signed)
Medication Instructions:  Your physician recommends that you continue on your current medications as directed. Please refer to the Current Medication list given to you today.   Labwork: None   Testing/Procedures: A chest x-ray takes a picture of the organs and structures inside the chest, including the heart, lungs, and blood vessels. This test can show several things, including, whether the heart is enlarges; whether fluid is building up in the lungs; and whether pacemaker / defibrillator leads are still in place. (To be done in 6 months)  Follow-Up: Your physician wants you to follow-up in: 6 months with Dr.Smith You will receive a reminder letter in the mail two months in advance. If you don't receive a letter, please call our office to schedule the follow-up appointment.   Any Other Special Instructions Will Be Listed Below (If Applicable).

## 2014-08-30 DIAGNOSIS — N3946 Mixed incontinence: Secondary | ICD-10-CM | POA: Diagnosis not present

## 2014-08-30 DIAGNOSIS — M62838 Other muscle spasm: Secondary | ICD-10-CM | POA: Diagnosis not present

## 2014-08-30 DIAGNOSIS — M6281 Muscle weakness (generalized): Secondary | ICD-10-CM | POA: Diagnosis not present

## 2014-08-30 DIAGNOSIS — R278 Other lack of coordination: Secondary | ICD-10-CM | POA: Diagnosis not present

## 2014-09-02 ENCOUNTER — Encounter: Payer: Self-pay | Admitting: Podiatry

## 2014-09-02 ENCOUNTER — Ambulatory Visit (INDEPENDENT_AMBULATORY_CARE_PROVIDER_SITE_OTHER): Payer: Medicare Other | Admitting: Podiatry

## 2014-09-02 ENCOUNTER — Ambulatory Visit: Payer: Medicare Other | Admitting: Podiatry

## 2014-09-02 VITALS — BP 134/55 | HR 66 | Resp 16

## 2014-09-02 DIAGNOSIS — M79676 Pain in unspecified toe(s): Secondary | ICD-10-CM | POA: Diagnosis not present

## 2014-09-02 DIAGNOSIS — B351 Tinea unguium: Secondary | ICD-10-CM

## 2014-09-02 NOTE — Progress Notes (Signed)
Patient ID: [name], female   DOB: [birth year], 81 y.o.   MRN: [id number] Complaint:  Visit Type: Patient returns to my office for continued preventative foot care services. Complaint: Patient states" my nails have grown long and thick and become painful to walk and wear shoes" . The patient presents for preventative foot care services. No changes to ROS  Podiatric Exam: Vascular: dorsalis pedis and posterior tibial pulses are palpable bilateral. Capillary return is immediate. Temperature gradient is WNL. Skin turgor WNL  Sensorium: Diminished  Semmes Weinstein monofilament test. Normal tactile sensation bilaterally. Nail Exam: Pt has thick disfigured discolored nails with subungual debris noted bilateral entire nail hallux through fifth toenails Ulcer Exam: There is no evidence of ulcer or pre-ulcerative changes or infection. Orthopedic Exam: Muscle tone and strength are WNL. No limitations in general ROM. No crepitus or effusions noted. Foot type and digits show no abnormalities. Bony prominences are unremarkable. Skin: No Porokeratosis. No infection or ulcers  Diagnosis:  Onychomycosis, , Pain in right toe, pain in left toes  Treatment & Plan Procedures and Treatment: Consent by patient was obtained for treatment procedures. The patient understood the discussion of treatment and procedures well. All questions were answered thoroughly reviewed. Debridement of mycotic and hypertrophic toenails, 1 through 5 bilateral and clearing of subungual debris. No ulceration, no infection noted.  Return Visit-Office Procedure: Patient instructed to return to the office for a follow up visit 3 months for continued evaluation and treatment.

## 2014-09-06 ENCOUNTER — Ambulatory Visit (INDEPENDENT_AMBULATORY_CARE_PROVIDER_SITE_OTHER): Payer: Medicare Other | Admitting: Pharmacist

## 2014-09-06 DIAGNOSIS — I480 Paroxysmal atrial fibrillation: Secondary | ICD-10-CM | POA: Diagnosis not present

## 2014-09-06 DIAGNOSIS — I4891 Unspecified atrial fibrillation: Secondary | ICD-10-CM

## 2014-09-06 DIAGNOSIS — Z5181 Encounter for therapeutic drug level monitoring: Secondary | ICD-10-CM | POA: Diagnosis not present

## 2014-09-06 LAB — POCT INR: INR: 2.2

## 2014-09-14 ENCOUNTER — Other Ambulatory Visit: Payer: Self-pay | Admitting: Interventional Cardiology

## 2014-09-22 ENCOUNTER — Encounter: Payer: Self-pay | Admitting: Neurology

## 2014-09-22 ENCOUNTER — Ambulatory Visit (INDEPENDENT_AMBULATORY_CARE_PROVIDER_SITE_OTHER): Payer: Medicare Other | Admitting: Neurology

## 2014-09-22 ENCOUNTER — Ambulatory Visit: Payer: Medicare Other | Admitting: Neurology

## 2014-09-22 VITALS — BP 112/52 | HR 78 | Resp 16 | Ht 64.0 in | Wt 140.0 lb

## 2014-09-22 DIAGNOSIS — H543 Unqualified visual loss, both eyes: Secondary | ICD-10-CM

## 2014-09-22 DIAGNOSIS — G250 Essential tremor: Secondary | ICD-10-CM

## 2014-09-22 DIAGNOSIS — M542 Cervicalgia: Secondary | ICD-10-CM | POA: Diagnosis not present

## 2014-09-22 DIAGNOSIS — R2689 Other abnormalities of gait and mobility: Secondary | ICD-10-CM

## 2014-09-22 DIAGNOSIS — R278 Other lack of coordination: Secondary | ICD-10-CM | POA: Diagnosis not present

## 2014-09-22 DIAGNOSIS — M2660 Temporomandibular joint disorder, unspecified: Secondary | ICD-10-CM | POA: Diagnosis not present

## 2014-09-22 DIAGNOSIS — H540 Blindness, both eyes: Secondary | ICD-10-CM | POA: Diagnosis not present

## 2014-09-22 DIAGNOSIS — M62838 Other muscle spasm: Secondary | ICD-10-CM | POA: Diagnosis not present

## 2014-09-22 DIAGNOSIS — M545 Low back pain: Secondary | ICD-10-CM | POA: Diagnosis not present

## 2014-09-22 DIAGNOSIS — M6281 Muscle weakness (generalized): Secondary | ICD-10-CM | POA: Diagnosis not present

## 2014-09-22 DIAGNOSIS — M26609 Unspecified temporomandibular joint disorder, unspecified side: Secondary | ICD-10-CM

## 2014-09-22 NOTE — Progress Notes (Signed)
Subjective:    Patient ID: [name] is a 81 y.o. female.  HPI     Interim history:   [name] is a very pleasant 81 year old right-handed woman with a complex underlying history of atrial fibrillation, thyroid disease, giant cell arteritis, DVT with PE, hysterectomy, status post [place] filter placement, spinal stenosis with surgery in 2000 twice, right knee replacement surgery in 2001, left knee replacement surgery in 2002, essential tremor, peripheral neuropathy, macular degeneration, and hearing loss, who presents for followup consultation of her multifactorial gait disorder, secondary to peripheral neuropathy, degenerative spine disease, blindness, hearing loss, vertigo, and her essential tremor. She is accompanied by her daughter, [name], again today. I last saw her on 04/05/2014, at which time she reported a one month Hx of intermittent dull headache and some sharp shooting pains on the right side of her head. Symptoms were different from when she was diagnosed in the past with giant cell arteritis. Nevertheless, she saw her primary care physician who ordered a head CT. She had a head CT on 03/18/2014: Mild patchy periventricular small vessel disease. No intracranial mass, hemorrhage, or acute appearing infarct. In addition, I personally reviewed the images through the PACS system. She also reported having had a sedimentation rate and CRP checked both of which were negative for concern for temporal arteritis. She saw her ophthalmologist who did not see any new problems explaining the situation. She had been diagnosed with wet macular degeneration and had injections into both eyes for treatment of this. Symptoms coincidentally started after she had her eye injection, about one week after she had her right I injected. She had her left eye injected a week after the right. She had no new one-sided neurological symptoms such as weakness, numbness, tingling, no facial droop, she did have some joint  pain in her TMJ on the right. She saw her dentist. She said he was not concerned. She was advised not to chew on hard foods and try to eat softer foods. I suggested we proceed with a brain MRI contrast, which she had on 04/26/14: Mildly abnormal MRI brain (without) demonstrating: 1. Scattered periventricular and subcortical foci of non-specific gliosis, likely chronic small vessel ischemic disease. 2. No acute findings. In addition, I personally reviewed the images through the PACS system. We called her with her test results.  Today, 09/22/2014: She reports that her headaches on the R side have resolved. She saw another dentist and was told to use a hollowed out pillow and was given a mouth piece for TMJ. She has been better since then. She has intermittent right arm pain and weakness and numbness and grip weakness. She had neck pain and saw her orthopedic doctor, had PT and felt better.   Previously:   I saw her on 09/21/2013, at which time she reported having had a UTI. She had stayed with her daughter. She had not fallen. Nocturia became worse. She was complaining of leg pain. She presents for a sooner than scheduled appointment secondary to new onset jaw pain.  I saw her on 03/18/2013, at which time her daughter reported that the patient was hospitalized for about 10 days in January 2015 for small bowel obstruction and symptoms consisting of abdominal pain, nausea and vomiting. Her hospitalization was complicated by aspiration pneumonia, A. fib, pulmonary embolus, nausea, vomiting, microscopic colitis, leukocytosis, CKD (chronic kidney disease), stage IV. She had home health therapies and has a emergency call button. She continues to live alone. She has no caregiver at home  on a daily basis, but her kids check with her frequently. She has help with cleaning. She cooks at the stove and occasionally uses a knife. Her INR was stable. I talked to her and her daughter at length last time and I was  particularly concerned about her continuing to live alone and working in the kitchen and at the stove because she is visually impaired and has had balance problems, gait disorder, abnormal posture and I advised her to have someone at the house for help on a day-to-day basis. She was advised to use her walker at all times. I did not suggest any new medications.  I first met her on 09/08/2012, at which time he felt she had remained fairly stable and I did not suggest any medication changes. My main concern was that she continued to live alone and I talked to her and her daughter about that. Because of her visual and hearing impairment, balance problems, and gait dysfunction and abnormal posture as well as her age I felt that she needed to start a discussion with her family about an alternative living situation. I advised her to use her walker at all times.   She previously followed with Dr. Morene Antu and was last seen by Dr. Erling Cruz on 03/12/2012, at which time he felt that she was stable and that she was doing well living by herself. She no longer drives. She sees Dr. Hardin Negus for pain management. She has had bladder incontinence. She has a long-standing history of peripheral neuropathy. She has a 2 story home. 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 One daughter is in [place], one in [place] and [name] is local and her son lives at [place]. She has a life alert button.    Her Past Medical History Is Significant For: Past Medical History  Diagnosis Date  . Hypothyroidism   . Colitis   . Blood clot associated with vein wall inflammation   . Pulmonary embolus     and DVT s/p IVC filter  . Paroxysmal atrial fibrillation   . Blindness   . SBO (small bowel obstruction)   . Temporal arteritis   . Goiter     s/p radioactive iod. tx  . Spinal stenosis of lumbar region with radiculopathy     possible neurogenic claudication recently?  Marland Kitchen PAF (paroxysmal atrial fibrillation)    . CKD (chronic kidney disease), stage IV     Her Past Surgical History Is Significant For: Past Surgical History  Procedure Laterality Date  . Back surgery  04/1998    spinal stenosis  . Back surgery  2000    Ruptured Disc  . Total knee arthroplasty Left 04/1999  . Total knee arthroplasty Right 02/2000  . Carpal tunnel release  2002  . Appendectomy  1940  . Partial hysterectomy    . Tonsillectomy  1934  . Breast biopsy      cysts in both breasts  . Ivc filter      Her Family History Is Significant For: Family History  Problem Relation Age of Onset  . Breast cancer Sister   . Crohn's disease Neg Hx   . Ulcerative colitis Neg Hx     Her Social History Is Significant For: Social History   Social History  . Marital Status: Widowed    Spouse Name: N/A  . Number of Children: 4  . Years of Education: College   Occupational History  .  retired   Social History Main Topics  . Smoking status: Never Smoker   . Smokeless tobacco: Never Used  . Alcohol Use: No  . Drug Use: No  . Sexual Activity: No   Other Topics Concern  . None   Social History Narrative   Patient lives at home alone.  Use walker to get around   Caffeine Use: rarely   Patient is right handed    Her Allergies Are:  Allergies  Allergen Reactions  . Cefdinir     Caused bad diarrhea  . Cortisone     Flushed hands and face   . Prednisone     cuased blood clots  . Sulfasalazine     Nausea side effects  . Sulfonamide Derivatives     Stomach pains and cramping   :   Her Current Medications Are:  Outpatient Encounter Prescriptions as of 09/22/2014  Medication Sig  . amiodarone (PACERONE) 200 MG tablet TAKE (1/2) TABLET DAILY.  . Calcium Carb-Cholecalciferol (CALCIUM 600 + D) 600-200 MG-UNIT TABS Take 1 tablet by mouth 3 (three) times daily.  Marland Kitchen COUMADIN 2.5 MG tablet TAKE AS DIRECTED.  Marland Kitchen levothyroxine (SYNTHROID, LEVOTHROID) 150 MCG tablet Take 150 mcg by mouth daily before breakfast.  .  Misc Natural Products (GLUCOSAMINE CHONDROITIN COMPLX) TABS Take 1 tablet by mouth 2 (two) times daily.   . Multiple Vitamins-Minerals (OCUVITE ADULT 50+ PO) Take 1 tablet by mouth daily.   No facility-administered encounter medications on file as of 09/22/2014.  :  Review of Systems:  Out of a complete 14 point review of systems, all are reviewed and negative with the exception of these symptoms as listed below:    Review of Systems  Neurological:       R hand and arm weakness.  Patient reports that her R-sided headaches (from last visit) are gone.      Objective:  Neurologic Exam  Physical Exam Physical Examination:   Filed Vitals:   09/22/14 1334  BP: 112/52  Pulse: 78  Resp: 16   General Examination: The patient is a very pleasant 81 y.o. female in no acute distress. She appears well-developed and well-nourished and very well groomed. She is in good spirits today.  HEENT: Normocephalic, atraumatic, pupils are equal, round and reactive to light and accommodation. She is visually impaired bilaterally. Extraocular tracking is good without limitation to gaze excursion or nystagmus noted. Normal smooth pursuit is noted. Hearing is impaired with hearing aid on the L. Face is symmetric with normal facial animation and normal facial sensation. Speech is clear with no dysarthria noted. There is no hypophonia. There is an unchanged lower lip, and no-no type neck/head, and voice tremor, slightly worse from before. Neck is supple with decrease in range of motion. There are no carotid bruits on auscultation. Oropharynx exam reveals: mild mouth dryness, adequate dental hygiene and no significant airway crowding.  Chest: Clear to auscultation without wheezing, rhonchi or crackles noted.  Heart: S1+S2+0, regular and normal without murmurs, rubs or gallops noted.   Abdomen: Soft, non-tender and non-distended with normal bowel sounds appreciated on auscultation.  Extremities: There is no  pitting edema in the distal lower extremities bilaterally. Pedal pulses are intact. she has unremarkable scars from bilateral knee replacement surgeries.  Skin: Warm and dry without trophic changes noted. There are no varicose veins.  Musculoskeletal: exam reveals no obvious joint deformities, tenderness or joint swelling or erythema, except for arthritic changes in both hands, right more than left. She has  scoliosis. She wears a brace. She has an abnormal neck position and on equal shoulder height with left higher than right.   Neurologically:  Mental status: The patient is awake, alert and oriented in all 4 spheres. Her memory, attention, language and knowledge are appropriate. There is no aphasia, agnosia, apraxia or anomia. Speech is clear with normal prosody and enunciation. Thought process is linear. Mood is congruent and affect is normal.  Cranial nerves are as described above under HEENT exam. Motor exam: Normal bulk, strength for age and tone is noted, with the exception of mild grip weakness bilaterally, right more so than left. There is no drift, tremor or rebound. Romberg is negative. Reflexes are 1+ throughout, absent in the knees and ankles. Fine motor skills are fairly well preserved.  Cerebellar testing shows no dysmetria or intention tremor on finger to nose testing. There is no truncal or gait ataxia.  Sensory exam is intact to light touch, pinprick, vibration, temperature sense in the upper extremities and decreased to all modalities in the LEs, unchanged, up to the knees, left a little better than right.    Gait, station and balance: she stands with mild difficulty and pushes herself up. She leans to the R. Posture is mild to moderately stooped for age. Stance is slightly wide-based. She cannot do tandem walk. She wears a soft back brace around the lower back. She uses her rolling walker fairly well, but has a tendency to turn too fast.  Assessment and Plan:   In summary, ASHELYN MCCRAVY is a very pleasant 81 year old female with a history of peripheral neuropathy, chronic degenerative spine disease, blindness, hearing loss, vertigo, and essential tremor and a gait disorder who presents for follow-up consultation of her gait disorder and essential tremor. She reported right-sided headaches which have since then resolved especially after she was given a mouth guard and was told to use a special pillow to help with her TMJ issues. We did blood work last time which were fine. She is encouraged to drink more water. She is advised to use her walker at all times. Her tremor has become a little worse. We talked about this today. She is not a good candidate for medication for tremor reduction for fear of side effects. I talked about this with her and her daughter today. She had an interim brain MRI without contrast which did not show any acute findings. She has a multifactorial gait disorder secondary to spinal stenosis, peripheral neuropathy, aging, essential tremor, and visual impairment.  I suggested gentle strengthening and range of motion exercises for her upper body and hands. I would like to see her back in 6 months for a recheck, I answered all her questions today and the patient and her daughter were in agreement. I spent 25 minutes in total face-to-face time with the patient, more than 50% of which was spent in counseling and coordination of care, reviewing test results, reviewing medication and discussing or reviewing the diagnosis of  gait disorder and tremors, the prognosis and treatment options.

## 2014-09-22 NOTE — Patient Instructions (Signed)
We will keep monitoring your symptoms. For your tremor, we should avoid any new medications for fear of side effects.  Your TMJ issues are better, I am glad to hear that.  Drink more water.  Use your water. Do gentle strengthening and range of motion exercises with your arms and hands.

## 2014-10-06 ENCOUNTER — Ambulatory Visit (INDEPENDENT_AMBULATORY_CARE_PROVIDER_SITE_OTHER): Payer: Medicare Other | Admitting: *Deleted

## 2014-10-06 DIAGNOSIS — I480 Paroxysmal atrial fibrillation: Secondary | ICD-10-CM

## 2014-10-06 DIAGNOSIS — S2231XA Fracture of one rib, right side, initial encounter for closed fracture: Secondary | ICD-10-CM | POA: Diagnosis not present

## 2014-10-06 DIAGNOSIS — R0781 Pleurodynia: Secondary | ICD-10-CM | POA: Diagnosis not present

## 2014-10-06 DIAGNOSIS — I4891 Unspecified atrial fibrillation: Secondary | ICD-10-CM | POA: Diagnosis not present

## 2014-10-06 DIAGNOSIS — H3532 Exudative age-related macular degeneration: Secondary | ICD-10-CM | POA: Diagnosis not present

## 2014-10-06 DIAGNOSIS — Z5181 Encounter for therapeutic drug level monitoring: Secondary | ICD-10-CM

## 2014-10-06 DIAGNOSIS — W19XXXA Unspecified fall, initial encounter: Secondary | ICD-10-CM | POA: Diagnosis not present

## 2014-10-06 LAB — POCT INR: INR: 1.8

## 2014-10-11 ENCOUNTER — Other Ambulatory Visit: Payer: Self-pay | Admitting: Interventional Cardiology

## 2014-10-12 DIAGNOSIS — K529 Noninfective gastroenteritis and colitis, unspecified: Secondary | ICD-10-CM | POA: Diagnosis not present

## 2014-10-21 ENCOUNTER — Ambulatory Visit (INDEPENDENT_AMBULATORY_CARE_PROVIDER_SITE_OTHER): Payer: Medicare Other | Admitting: Pharmacist

## 2014-10-21 DIAGNOSIS — I4891 Unspecified atrial fibrillation: Secondary | ICD-10-CM | POA: Diagnosis not present

## 2014-10-21 DIAGNOSIS — I480 Paroxysmal atrial fibrillation: Secondary | ICD-10-CM | POA: Diagnosis not present

## 2014-10-21 DIAGNOSIS — Z5181 Encounter for therapeutic drug level monitoring: Secondary | ICD-10-CM

## 2014-10-21 LAB — POCT INR: INR: 2.1

## 2014-10-26 DIAGNOSIS — Z23 Encounter for immunization: Secondary | ICD-10-CM | POA: Diagnosis not present

## 2014-11-03 ENCOUNTER — Encounter: Payer: Self-pay | Admitting: Podiatry

## 2014-11-03 ENCOUNTER — Ambulatory Visit (INDEPENDENT_AMBULATORY_CARE_PROVIDER_SITE_OTHER): Payer: Medicare Other | Admitting: Podiatry

## 2014-11-03 DIAGNOSIS — M79676 Pain in unspecified toe(s): Secondary | ICD-10-CM | POA: Diagnosis not present

## 2014-11-03 DIAGNOSIS — B351 Tinea unguium: Secondary | ICD-10-CM

## 2014-11-03 NOTE — Progress Notes (Signed)
Patient ID: [name], female   DOB: 1920-11-12, 81 y.o.   MRN: [id number] Complaint:  Visit Type: Patient returns to my office for continued preventative foot care services. Complaint: Patient states" my nails have grown long and thick and become painful to walk and wear shoes" . The patient presents for preventative foot care services. No changes to ROS  Podiatric Exam: Vascular: dorsalis pedis and posterior tibial pulses are palpable bilateral. Capillary return is immediate. Temperature gradient is WNL. Skin turgor WNL  Sensorium: Diminished  Semmes Weinstein monofilament test. Normal tactile sensation bilaterally. Nail Exam: Pt has thick disfigured discolored nails with subungual debris noted bilateral entire nail hallux through fifth toenails Ulcer Exam: There is no evidence of ulcer or pre-ulcerative changes or infection. Orthopedic Exam: Muscle tone and strength are WNL. No limitations in general ROM. No crepitus or effusions noted. Foot type and digits show no abnormalities. Bony prominences are unremarkable. Skin: No Porokeratosis. No infection or ulcers  Diagnosis:  Onychomycosis, , Pain in right toe, pain in left toes  Treatment & Plan Procedures and Treatment: Consent by patient was obtained for treatment procedures. The patient understood the discussion of treatment and procedures well. All questions were answered thoroughly reviewed. Debridement of mycotic and hypertrophic toenails, 1 through 5 bilateral and clearing of subungual debris. No ulceration, no infection noted.  Return Visit-Office Procedure: Patient instructed to return to the office for a follow up visit 3 months for continued evaluation and treatment.

## 2014-11-17 ENCOUNTER — Ambulatory Visit (INDEPENDENT_AMBULATORY_CARE_PROVIDER_SITE_OTHER): Payer: Medicare Other | Admitting: *Deleted

## 2014-11-17 DIAGNOSIS — I4891 Unspecified atrial fibrillation: Secondary | ICD-10-CM

## 2014-11-17 DIAGNOSIS — Z5181 Encounter for therapeutic drug level monitoring: Secondary | ICD-10-CM | POA: Diagnosis not present

## 2014-11-17 DIAGNOSIS — I480 Paroxysmal atrial fibrillation: Secondary | ICD-10-CM

## 2014-11-17 DIAGNOSIS — H353211 Exudative age-related macular degeneration, right eye, with active choroidal neovascularization: Secondary | ICD-10-CM | POA: Diagnosis not present

## 2014-11-17 DIAGNOSIS — H353222 Exudative age-related macular degeneration, left eye, with inactive choroidal neovascularization: Secondary | ICD-10-CM | POA: Diagnosis not present

## 2014-11-17 LAB — POCT INR: INR: 2.2

## 2014-11-21 DIAGNOSIS — S0990XA Unspecified injury of head, initial encounter: Secondary | ICD-10-CM | POA: Diagnosis not present

## 2014-11-21 DIAGNOSIS — Z7901 Long term (current) use of anticoagulants: Secondary | ICD-10-CM | POA: Diagnosis not present

## 2014-12-15 ENCOUNTER — Ambulatory Visit (INDEPENDENT_AMBULATORY_CARE_PROVIDER_SITE_OTHER): Payer: Medicare Other

## 2014-12-15 DIAGNOSIS — I480 Paroxysmal atrial fibrillation: Secondary | ICD-10-CM | POA: Diagnosis not present

## 2014-12-15 DIAGNOSIS — Z5181 Encounter for therapeutic drug level monitoring: Secondary | ICD-10-CM | POA: Diagnosis not present

## 2014-12-15 LAB — POCT INR: INR: 1.8

## 2014-12-22 DIAGNOSIS — H353211 Exudative age-related macular degeneration, right eye, with active choroidal neovascularization: Secondary | ICD-10-CM | POA: Diagnosis not present

## 2015-01-02 ENCOUNTER — Telehealth: Payer: Self-pay

## 2015-01-02 DIAGNOSIS — Z1389 Encounter for screening for other disorder: Secondary | ICD-10-CM | POA: Diagnosis not present

## 2015-01-02 DIAGNOSIS — N183 Chronic kidney disease, stage 3 (moderate): Secondary | ICD-10-CM | POA: Diagnosis not present

## 2015-01-02 DIAGNOSIS — E782 Mixed hyperlipidemia: Secondary | ICD-10-CM | POA: Diagnosis not present

## 2015-01-02 DIAGNOSIS — E039 Hypothyroidism, unspecified: Secondary | ICD-10-CM | POA: Diagnosis not present

## 2015-01-02 DIAGNOSIS — Z8719 Personal history of other diseases of the digestive system: Secondary | ICD-10-CM | POA: Diagnosis not present

## 2015-01-02 DIAGNOSIS — I509 Heart failure, unspecified: Secondary | ICD-10-CM | POA: Diagnosis not present

## 2015-01-02 DIAGNOSIS — K625 Hemorrhage of anus and rectum: Secondary | ICD-10-CM | POA: Diagnosis not present

## 2015-01-02 DIAGNOSIS — I4891 Unspecified atrial fibrillation: Secondary | ICD-10-CM | POA: Diagnosis not present

## 2015-01-02 DIAGNOSIS — K529 Noninfective gastroenteritis and colitis, unspecified: Secondary | ICD-10-CM | POA: Diagnosis not present

## 2015-01-02 DIAGNOSIS — D649 Anemia, unspecified: Secondary | ICD-10-CM | POA: Diagnosis not present

## 2015-01-02 DIAGNOSIS — R8290 Unspecified abnormal findings in urine: Secondary | ICD-10-CM | POA: Diagnosis not present

## 2015-01-02 DIAGNOSIS — M545 Low back pain: Secondary | ICD-10-CM | POA: Diagnosis not present

## 2015-01-02 LAB — PROTIME-INR: INR: 2 — AB (ref <=1.1)

## 2015-01-02 NOTE — Telephone Encounter (Signed)
[name] called states pt went to primary MD today 01/02/15 INR 2.0, they are faxing hard copy over to our office.  Pt saw primary MD for UTI today, rx Ampicillin today.  Pt also has a bleeding  And they checked INR secondary to bleeding.  Pt has an appt on 01/05/15 which they want to cancel and r/s.  Advised as soon as we receive hard copy result from primary MD we will call pt and dose Coumadin and r/s appt on 01/05/15 to a later date.

## 2015-01-04 ENCOUNTER — Ambulatory Visit (INDEPENDENT_AMBULATORY_CARE_PROVIDER_SITE_OTHER): Payer: Medicare Other | Admitting: Internal Medicine

## 2015-01-04 DIAGNOSIS — I480 Paroxysmal atrial fibrillation: Secondary | ICD-10-CM

## 2015-01-04 DIAGNOSIS — Z5181 Encounter for therapeutic drug level monitoring: Secondary | ICD-10-CM

## 2015-01-05 ENCOUNTER — Ambulatory Visit (INDEPENDENT_AMBULATORY_CARE_PROVIDER_SITE_OTHER): Payer: Medicare Other | Admitting: Podiatry

## 2015-01-05 DIAGNOSIS — B351 Tinea unguium: Secondary | ICD-10-CM | POA: Diagnosis not present

## 2015-01-05 DIAGNOSIS — K529 Noninfective gastroenteritis and colitis, unspecified: Secondary | ICD-10-CM | POA: Diagnosis not present

## 2015-01-05 DIAGNOSIS — M79676 Pain in unspecified toe(s): Secondary | ICD-10-CM | POA: Diagnosis not present

## 2015-01-05 DIAGNOSIS — K921 Melena: Secondary | ICD-10-CM | POA: Diagnosis not present

## 2015-01-05 DIAGNOSIS — K644 Residual hemorrhoidal skin tags: Secondary | ICD-10-CM | POA: Diagnosis not present

## 2015-01-05 NOTE — Addendum Note (Signed)
Addended by: Ezzard Flax, Desman Polak L on: 01/05/2015 11:44 AM   Modules accepted: Medications

## 2015-01-05 NOTE — Progress Notes (Signed)
Patient ID: [name], female   DOB: 16-Dec-1920, 81 y.o.   MRN: [id number] Complaint:  Visit Type: Patient returns to my office for continued preventative foot care services. Complaint: Patient states" my nails have grown long and thick and become painful to walk and wear shoes" . The patient presents for preventative foot care services. No changes to ROS  Podiatric Exam: Vascular: dorsalis pedis and posterior tibial pulses are palpable bilateral. Capillary return is immediate. Temperature gradient is WNL. Skin turgor WNL  Sensorium: Diminished  Semmes Weinstein monofilament test. Normal tactile sensation bilaterally. Nail Exam: Pt has thick disfigured discolored nails with subungual debris noted bilateral entire nail hallux through fifth toenails Ulcer Exam: There is no evidence of ulcer or pre-ulcerative changes or infection. Orthopedic Exam: Muscle tone and strength are WNL. No limitations in general ROM. No crepitus or effusions noted. Foot type and digits show no abnormalities. Bony prominences are unremarkable. Skin: No Porokeratosis. No infection or ulcers  Diagnosis:  Onychomycosis, , Pain in right toe, pain in left toes  Treatment & Plan Procedures and Treatment: Consent by patient was obtained for treatment procedures. The patient understood the discussion of treatment and procedures well. All questions were answered thoroughly reviewed. Debridement of mycotic and hypertrophic toenails, 1 through 5 bilateral and clearing of subungual debris. No ulceration, no infection noted.  Return Visit-Office Procedure: Patient instructed to return to the office for a follow up visit 3 months for continued evaluation and treatment.   Gardiner Barefoot DPM

## 2015-01-19 DIAGNOSIS — H353221 Exudative age-related macular degeneration, left eye, with active choroidal neovascularization: Secondary | ICD-10-CM | POA: Diagnosis not present

## 2015-01-19 DIAGNOSIS — H353211 Exudative age-related macular degeneration, right eye, with active choroidal neovascularization: Secondary | ICD-10-CM | POA: Diagnosis not present

## 2015-01-26 ENCOUNTER — Ambulatory Visit (INDEPENDENT_AMBULATORY_CARE_PROVIDER_SITE_OTHER): Payer: Medicare Other | Admitting: *Deleted

## 2015-01-26 DIAGNOSIS — Z5181 Encounter for therapeutic drug level monitoring: Secondary | ICD-10-CM

## 2015-01-26 DIAGNOSIS — I4891 Unspecified atrial fibrillation: Secondary | ICD-10-CM | POA: Diagnosis not present

## 2015-01-26 DIAGNOSIS — I480 Paroxysmal atrial fibrillation: Secondary | ICD-10-CM

## 2015-01-26 LAB — POCT INR: INR: 2.2

## 2015-01-31 DIAGNOSIS — H903 Sensorineural hearing loss, bilateral: Secondary | ICD-10-CM | POA: Diagnosis not present

## 2015-02-07 DIAGNOSIS — D649 Anemia, unspecified: Secondary | ICD-10-CM | POA: Diagnosis not present

## 2015-02-07 DIAGNOSIS — K921 Melena: Secondary | ICD-10-CM | POA: Diagnosis not present

## 2015-02-08 DIAGNOSIS — H353211 Exudative age-related macular degeneration, right eye, with active choroidal neovascularization: Secondary | ICD-10-CM | POA: Diagnosis not present

## 2015-02-08 DIAGNOSIS — H2513 Age-related nuclear cataract, bilateral: Secondary | ICD-10-CM | POA: Diagnosis not present

## 2015-02-10 DIAGNOSIS — M47816 Spondylosis without myelopathy or radiculopathy, lumbar region: Secondary | ICD-10-CM | POA: Diagnosis not present

## 2015-02-10 DIAGNOSIS — M4316 Spondylolisthesis, lumbar region: Secondary | ICD-10-CM | POA: Diagnosis not present

## 2015-02-10 DIAGNOSIS — M4806 Spinal stenosis, lumbar region: Secondary | ICD-10-CM | POA: Diagnosis not present

## 2015-02-23 ENCOUNTER — Ambulatory Visit (INDEPENDENT_AMBULATORY_CARE_PROVIDER_SITE_OTHER): Payer: Medicare Other

## 2015-02-23 DIAGNOSIS — I4891 Unspecified atrial fibrillation: Secondary | ICD-10-CM | POA: Diagnosis not present

## 2015-02-23 DIAGNOSIS — Z5181 Encounter for therapeutic drug level monitoring: Secondary | ICD-10-CM

## 2015-02-23 DIAGNOSIS — I480 Paroxysmal atrial fibrillation: Secondary | ICD-10-CM | POA: Diagnosis not present

## 2015-02-23 LAB — POCT INR: INR: 2.5

## 2015-02-25 ENCOUNTER — Other Ambulatory Visit: Payer: Self-pay | Admitting: Interventional Cardiology

## 2015-03-02 DIAGNOSIS — H21561 Pupillary abnormality, right eye: Secondary | ICD-10-CM | POA: Diagnosis not present

## 2015-03-02 DIAGNOSIS — H25811 Combined forms of age-related cataract, right eye: Secondary | ICD-10-CM | POA: Diagnosis not present

## 2015-03-02 DIAGNOSIS — H2511 Age-related nuclear cataract, right eye: Secondary | ICD-10-CM | POA: Diagnosis not present

## 2015-03-02 DIAGNOSIS — H25041 Posterior subcapsular polar age-related cataract, right eye: Secondary | ICD-10-CM | POA: Diagnosis not present

## 2015-03-02 DIAGNOSIS — H401410 Capsular glaucoma with pseudoexfoliation of lens, right eye, stage unspecified: Secondary | ICD-10-CM | POA: Diagnosis not present

## 2015-03-09 ENCOUNTER — Encounter: Payer: Self-pay | Admitting: Podiatry

## 2015-03-09 ENCOUNTER — Ambulatory Visit (INDEPENDENT_AMBULATORY_CARE_PROVIDER_SITE_OTHER): Payer: Medicare Other | Admitting: Podiatry

## 2015-03-09 DIAGNOSIS — B351 Tinea unguium: Secondary | ICD-10-CM | POA: Diagnosis not present

## 2015-03-09 DIAGNOSIS — M79676 Pain in unspecified toe(s): Secondary | ICD-10-CM

## 2015-03-09 NOTE — Progress Notes (Signed)
Patient ID: [name], female   DOB: 1920/12/23, 82 y.o.   MRN: [id number] Complaint:  Visit Type: Patient returns to my office for continued preventative foot care services. Complaint: Patient states" my nails have grown long and thick and become painful to walk and wear shoes" . The patient presents for preventative foot care services. No changes to ROS  Podiatric Exam: Vascular: dorsalis pedis and posterior tibial pulses are palpable bilateral. Capillary return is immediate. Temperature gradient is WNL. Skin turgor WNL  Sensorium: Diminished  Semmes Weinstein monofilament test. Normal tactile sensation bilaterally. Nail Exam: Pt has thick disfigured discolored nails with subungual debris noted bilateral entire nail hallux through fifth toenails Ulcer Exam: There is no evidence of ulcer or pre-ulcerative changes or infection. Orthopedic Exam: Muscle tone and strength are WNL. No limitations in general ROM. No crepitus or effusions noted. Foot type and digits show no abnormalities. Bony prominences are unremarkable. Skin: No Porokeratosis. No infection or ulcers  Diagnosis:  Onychomycosis, , Pain in right toe, pain in left toes  Treatment & Plan Procedures and Treatment: Consent by patient was obtained for treatment procedures. The patient understood the discussion of treatment and procedures well. All questions were answered thoroughly reviewed. Debridement of mycotic and hypertrophic toenails, 1 through 5 bilateral and clearing of subungual debris. No ulceration, no infection noted.  Return Visit-Office Procedure: Patient instructed to return to the office for a follow up visit 3 months for continued evaluation and treatment.   Gardiner Barefoot DPM

## 2015-03-16 DIAGNOSIS — H353212 Exudative age-related macular degeneration, right eye, with inactive choroidal neovascularization: Secondary | ICD-10-CM | POA: Diagnosis not present

## 2015-03-16 DIAGNOSIS — H353222 Exudative age-related macular degeneration, left eye, with inactive choroidal neovascularization: Secondary | ICD-10-CM | POA: Diagnosis not present

## 2015-03-23 ENCOUNTER — Ambulatory Visit (INDEPENDENT_AMBULATORY_CARE_PROVIDER_SITE_OTHER): Payer: Medicare Other | Admitting: Neurology

## 2015-03-23 ENCOUNTER — Encounter: Payer: Self-pay | Admitting: Interventional Cardiology

## 2015-03-23 ENCOUNTER — Ambulatory Visit (INDEPENDENT_AMBULATORY_CARE_PROVIDER_SITE_OTHER): Payer: Medicare Other | Admitting: Pharmacist

## 2015-03-23 ENCOUNTER — Ambulatory Visit (INDEPENDENT_AMBULATORY_CARE_PROVIDER_SITE_OTHER): Payer: Medicare Other | Admitting: Interventional Cardiology

## 2015-03-23 ENCOUNTER — Encounter: Payer: Self-pay | Admitting: Neurology

## 2015-03-23 VITALS — BP 128/62 | HR 68 | Resp 16 | Ht 66.0 in | Wt 137.0 lb

## 2015-03-23 VITALS — BP 104/50 | HR 65 | Ht 66.0 in | Wt 137.6 lb

## 2015-03-23 DIAGNOSIS — N184 Chronic kidney disease, stage 4 (severe): Secondary | ICD-10-CM

## 2015-03-23 DIAGNOSIS — I4891 Unspecified atrial fibrillation: Secondary | ICD-10-CM

## 2015-03-23 DIAGNOSIS — Z5181 Encounter for therapeutic drug level monitoring: Secondary | ICD-10-CM

## 2015-03-23 DIAGNOSIS — I5032 Chronic diastolic (congestive) heart failure: Secondary | ICD-10-CM

## 2015-03-23 DIAGNOSIS — H9193 Unspecified hearing loss, bilateral: Secondary | ICD-10-CM

## 2015-03-23 DIAGNOSIS — M542 Cervicalgia: Secondary | ICD-10-CM | POA: Diagnosis not present

## 2015-03-23 DIAGNOSIS — H540 Blindness, both eyes: Secondary | ICD-10-CM | POA: Diagnosis not present

## 2015-03-23 DIAGNOSIS — M4806 Spinal stenosis, lumbar region: Secondary | ICD-10-CM | POA: Diagnosis not present

## 2015-03-23 DIAGNOSIS — Z79899 Other long term (current) drug therapy: Secondary | ICD-10-CM

## 2015-03-23 DIAGNOSIS — R2689 Other abnormalities of gait and mobility: Secondary | ICD-10-CM

## 2015-03-23 DIAGNOSIS — G250 Essential tremor: Secondary | ICD-10-CM

## 2015-03-23 DIAGNOSIS — H543 Unqualified visual loss, both eyes: Secondary | ICD-10-CM

## 2015-03-23 DIAGNOSIS — I482 Chronic atrial fibrillation, unspecified: Secondary | ICD-10-CM

## 2015-03-23 DIAGNOSIS — M48061 Spinal stenosis, lumbar region without neurogenic claudication: Secondary | ICD-10-CM

## 2015-03-23 LAB — POCT INR: INR: 1.9

## 2015-03-23 NOTE — Patient Instructions (Signed)
Medication Instructions:  Your physician recommends that you continue on your current medications as directed. Please refer to the Current Medication list given to you today.   Labwork: None ordered  Testing/Procedures: A chest x-ray takes a picture of the organs and structures inside the chest, including the heart, lungs, and blood vessels. This test can show several things, including, whether the heart is enlarges; whether fluid is building up in the lungs; and whether pacemaker / defibrillator leads are still in place.  ( Please have this done at [address] floor)  Follow-Up: Your physician wants you to follow-up in: 4 months with Dr.Smith You will receive a reminder letter in the mail two months in advance. If you don't receive a letter, please call our office to schedule the follow-up appointment.   Any Other Special Instructions Will Be Listed Below (If Applicable).     If you need a refill on your cardiac medications before your next appointment, please call your pharmacy.

## 2015-03-23 NOTE — Patient Instructions (Addendum)
Your exam is stable and I would like for you to continue to try to stay physically active, but use your walker at all times and please take your time turning.  Stay well hydrated and eat a balance, nutritious diet with different types of fruit and vegetables and different types of protein.  I will see you back in 6 months.

## 2015-03-23 NOTE — Progress Notes (Signed)
Cardiology Office Note   Date:  03/23/2015   ID:  [name], DOB April 14, 1920, MRN [id number]  PCP:  [name], MD  Cardiologist:  Sinclair Grooms, MD   Chief Complaint  Patient presents with  . Atrial Fibrillation      History of Present Illness: [name] is a 82 y.o. female who presents for Follow-up of paroxysmal atrial fibrillation, amiodarone therapy, and chronic anticoagulation.  She is doing well. No recurrence of atrial fibrillation. Denies episodes of syncope. Has had a funny axillary chest discomfort 2-3 times over the past 6 months. The discomfort may last up to 30 minutes. No associated radiation, diaphoresis, or dyspnea. Not exertion related. Usually occurs at night after she has eaten. She denies lower extremity swelling. No transient neurological deficits. No blood in the urine or stool.    Past Medical History  Diagnosis Date  . Hypothyroidism   . Colitis   . Blood clot associated with vein wall inflammation   . Pulmonary embolus (HCC)     and DVT s/p IVC filter  . Paroxysmal atrial fibrillation (HCC)   . Blindness   . SBO (small bowel obstruction) ([place])   . Temporal arteritis ([place])   . Goiter     s/p radioactive iod. tx  . Spinal stenosis of lumbar region with radiculopathy     possible neurogenic claudication recently?  Marland Kitchen PAF (paroxysmal atrial fibrillation) ([place])   . CKD (chronic kidney disease), stage IV [facility])     Past Surgical History  Procedure Laterality Date  . Back surgery  04/1998    spinal stenosis  . Back surgery  2000    Ruptured Disc  . Total knee arthroplasty Left 04/1999  . Total knee arthroplasty Right 02/2000  . Carpal tunnel release  2002  . Appendectomy  1940  . Partial hysterectomy    . Tonsillectomy  1934  . Breast biopsy      cysts in both breasts  . Ivc filter       Current Outpatient Prescriptions  Medication Sig Dispense Refill  . amiodarone (PACERONE) 200 MG tablet Take 100 mg by mouth daily.    .  Calcium Carb-Cholecalciferol (CALCIUM 600 + D) 600-200 MG-UNIT TABS Take 1 tablet by mouth 3 (three) times daily.    Marland Kitchen COUMADIN 2.5 MG tablet TAKE AS DIRECTED. 35 tablet 3  . levothyroxine (SYNTHROID, LEVOTHROID) 150 MCG tablet Take 150 mcg by mouth daily before breakfast.    . Misc Natural Products (GLUCOSAMINE CHONDROITIN COMPLX) TABS Take 1 tablet by mouth 2 (two) times daily.     . Multiple Vitamins-Minerals (OCUVITE ADULT 50+ PO) Take 1 tablet by mouth daily.     No current facility-administered medications for this visit.    Allergies:   Cefdinir; Cortisone; Prednisone; Sulfasalazine; and Sulfonamide derivatives    Social History:  The patient  reports that she has never smoked. She has never used smokeless tobacco. She reports that she does not drink alcohol or use illicit drugs.   Family History:  The patient's family history includes Breast cancer in her sister; Congestive Heart Failure in her mother; Stroke in her father. There is no history of Crohn's disease or Ulcerative colitis.    ROS:  Please see the history of present illness.   Otherwise, review of systems are positive for decreased hearing. Vision loss. Back discomfort and easy bruising. Also left leg discomfort. Has difficulty with balance and now uses a walker all the time..   All other  systems are reviewed and negative.    PHYSICAL EXAM: VS:  BP 104/50 mmHg  Pulse 65  Ht 5\' 6"  (1.676 m)  Wt 137 lb 9.6 oz (62.415 kg)  BMI 22.22 kg/m2 , BMI Body mass index is 22.22 kg/(m^2). GEN: Well nourished, well developed, in no acute distress HEENT: normal Neck: no JVD, carotid bruits, or masses Cardiac: RRR.  There is no murmur, rub, or gallop. There is no edema. Respiratory:  clear to auscultation bilaterally, normal work of breathing. GI: soft, nontender, nondistended, + BS MS: no deformity or atrophy Skin: warm and dry, no rash Neuro:  Strength and sensation are intact Psych: euthymic mood, full affect   EKG:  EKG  is not ordered today.    Recent Labs: 04/05/2014: ALT 16; BUN 32; Creatinine, Ser 1.26*; Potassium 4.9; Sodium 138    Lipid Panel No results found for: CHOL, TRIG, HDL, CHOLHDL, VLDL, LDLCALC, LDLDIRECT    Wt Readings from Last 3 Encounters:  03/23/15 137 lb 9.6 oz (62.415 kg)  03/23/15 137 lb (62.143 kg)  09/22/14 140 lb (63.504 kg)      Other studies Reviewed: Additional studies/ records that were reviewed today include: Unable to find any laboratory data referred by Dr. Moreen Fowler or chest x-ray reports.. The findings include last liver panel 6 months ago was unremarkable.    ASSESSMENT AND PLAN:  1. Chronic atrial fibrillation (HCC) Heart rate is well controlled  2. Encounter for therapeutic drug monitoring  Not adequately following amiodarone  3. Chronic diastolic heart failure (HCC) No volume overload  4. CKD (chronic kidney disease), stage IV ([place]) Not recently evaluated  5. Atypical chest discomfort  Current medicines are reviewed at length with the patient today.  The patient has the following concerns regarding medicines: None.  The following changes/actions have been instituted:    P A and llateral chest x-ray  Monitor chest discomfort  Labs/ tests ordered today include:  No orders of the defined types were placed in this encounter.     Disposition:   FU with HS in 4 months  Signed, Sinclair Grooms, MD  03/23/2015 2:48 PM    [facility], [place], [place]  [address] Phone: [phone]; Fax: [phone]

## 2015-03-23 NOTE — Progress Notes (Signed)
Subjective:    Patient ID: [name] is a 82 y.o. female.  HPI     Interim history:   [name] is a very pleasant 82 year old right-handed woman with a complex underlying history of atrial fibrillation, thyroid disease, giant cell arteritis, DVT with PE, hysterectomy, status post [place] filter placement, spinal stenosis with surgery in 2000, right knee replacement surgery in 2001, left knee replacement surgery in 2002, essential tremor, peripheral neuropathy, macular degeneration, and hearing loss, who presents for followup consultation of her multifactorial gait disorder, secondary to peripheral neuropathy, advanced degenerative spine disease, blindness, hearing loss, vertigo, and her essential tremor. She is accompanied by her daughter, [name], again today. I last saw her on 09/22/2014, at which time she reported that her headaches on the R side had resolved. She saw another dentist and was told to use a hollowed out pillow and was given a mouth piece for TMJ and this had helped. She had intermittent right arm pain and weakness and numbness and grip weakness. She had neck pain and saw her orthopedic doctor, had PT and felt better. I suggested she continue using her walker and drink water. I suggested gentle strengthening and range of motion exercises for her upper body.  Today, 03/23/2015: She reports more weakness in the lower extremities and multiple back pain and lower leg pain. She tries to do exercises sitting down and also uses her walker walks every day. She tries to stay upbeat. She had right-sided cataract surgery recently and did well. She has a left-sided cataract. Unfortunately, she did fall recently and bumped her head. Thankfully she had no injuries and no loss of consciousness and reports no headache. She tried to use weights to her legs to help with strengthening exercises and leg lifts but she started having more back pain. We reviewed an x-ray she had of the lumbar spine in  2008 and this showed advanced degenerative changes, and postoperative changes. She had surgery to the lower back in 2000. Sometimes she has radiating tingling and numbness from the neck on down to the right arm.her neuropathy bothers her at night. She feels that her essential tremor is worse as well.  Previously:   I saw her on 04/05/2014, at which time she reported a one month Hx of intermittent dull headache and some sharp shooting pains on the right side of her head. Symptoms were different from when she was diagnosed in the past with giant cell arteritis. Nevertheless, she saw her primary care physician who ordered a head CT. She had a head CT on 03/18/2014: Mild patchy periventricular small vessel disease. No intracranial mass, hemorrhage, or acute appearing infarct. In addition, I personally reviewed the images through the PACS system. She also reported having had a sedimentation rate and CRP checked both of which were negative for concern for temporal arteritis. She saw her ophthalmologist who did not see any new problems explaining the situation. She had been diagnosed with wet macular degeneration and had injections into both eyes for treatment of this. Symptoms coincidentally started after she had her eye injection, about one week after she had her right I injected. She had her left eye injected a week after the right. She had no new one-sided neurological symptoms such as weakness, numbness, tingling, no facial droop, she did have some joint pain in her TMJ on the right. She saw her dentist. She said he was not concerned. She was advised not to chew on hard foods and try to eat softer foods.  I suggested we proceed with a brain MRI contrast, which she had on 04/26/14: Mildly abnormal MRI brain (without) demonstrating: 1. Scattered periventricular and subcortical foci of non-specific gliosis, likely chronic small vessel ischemic disease. 2. No acute findings. In addition, I personally reviewed the  images through the PACS system. We called her with her test results.  I saw her on 09/21/2013, at which time she reported having had a UTI. She had stayed with her daughter. She had not fallen. Nocturia became worse. She was complaining of leg pain. She presents for a sooner than scheduled appointment secondary to new onset jaw pain.  I saw her on 03/18/2013, at which time her daughter reported that the patient was hospitalized for about 10 days in January 2015 for small bowel obstruction and symptoms consisting of abdominal pain, nausea and vomiting. Her hospitalization was complicated by aspiration pneumonia, A. fib, pulmonary embolus, nausea, vomiting, microscopic colitis, leukocytosis, CKD (chronic kidney disease), stage IV. She had home health therapies and has a emergency call button. She continues to live alone. She has no caregiver at home on a daily basis, but her kids check with her frequently. She has help with cleaning. She cooks at the stove and occasionally uses a knife. Her INR was stable. I talked to her and her daughter at length last time and I was particularly concerned about her continuing to live alone and working in the kitchen and at the stove because she is visually impaired and has had balance problems, gait disorder, abnormal posture and I advised her to have someone at the house for help on a day-to-day basis. She was advised to use her walker at all times. I did not suggest any new medications.  I first met her on 09/08/2012, at which time he felt she had remained fairly stable and I did not suggest any medication changes. My main concern was that she continued to live alone and I talked to her and her daughter about that. Because of her visual and hearing impairment, balance problems, and gait dysfunction and abnormal posture as well as her age I felt that she needed to start a discussion with her family about an alternative living situation. I advised her to use her walker at all  times.   She previously followed with Dr. Morene Antu and was last seen by Dr. Erling Cruz on 03/12/2012, at which time he felt that she was stable and that she was doing well living by herself. She no longer drives. She sees Dr. Hardin Negus for pain management. She has had bladder incontinence. She has a long-standing history of peripheral neuropathy. She has a 2 story home. 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 One daughter is in [place], one in [place] and [name] is local and her son lives at [place]. She has a life alert button.    Her Past Medical History Is Significant For: Past Medical History  Diagnosis Date  . Hypothyroidism   . Colitis   . Blood clot associated with vein wall inflammation   . Pulmonary embolus (HCC)     and DVT s/p IVC filter  . Paroxysmal atrial fibrillation (HCC)   . Blindness   . SBO (small bowel obstruction) ([place])   . Temporal arteritis ([place])   . Goiter     s/p radioactive iod. tx  . Spinal stenosis of lumbar region with radiculopathy     possible neurogenic claudication recently?  Marland Kitchen PAF (paroxysmal atrial fibrillation) (  [place])   . CKD (chronic kidney disease), stage IV ([place])     Her Past Surgical History Is Significant For: Past Surgical History  Procedure Laterality Date  . Back surgery  04/1998    spinal stenosis  . Back surgery  2000    Ruptured Disc  . Total knee arthroplasty Left 04/1999  . Total knee arthroplasty Right 02/2000  . Carpal tunnel release  2002  . Appendectomy  1940  . Partial hysterectomy    . Tonsillectomy  1934  . Breast biopsy      cysts in both breasts  . Ivc filter      Her Family History Is Significant For: Family History  Problem Relation Age of Onset  . Breast cancer Sister   . Crohn's disease Neg Hx   . Ulcerative colitis Neg Hx     Her Social History Is Significant For: Social History   Social History  . Marital Status: Widowed    Spouse Name: N/A  . Number of Children: 4  . Years  of Education: College   Occupational History  .      retired   Social History Main Topics  . Smoking status: Never Smoker   . Smokeless tobacco: Never Used  . Alcohol Use: No  . Drug Use: No  . Sexual Activity: No   Other Topics Concern  . None   Social History Narrative   Patient lives at home alone.  Use walker to get around   Caffeine Use: rarely   Patient is right handed    Her Allergies Are:  Allergies  Allergen Reactions  . Cefdinir     Caused bad diarrhea  . Cortisone     Flushed hands and face   . Prednisone     cuased blood clots  . Sulfasalazine     Nausea side effects  . Sulfonamide Derivatives     Stomach pains and cramping   :   Her Current Medications Are:  Outpatient Encounter Prescriptions as of 03/23/2015  Medication Sig  . amiodarone (PACERONE) 200 MG tablet TAKE (1/2) TABLET DAILY.  . Calcium Carb-Cholecalciferol (CALCIUM 600 + D) 600-200 MG-UNIT TABS Take 1 tablet by mouth 3 (three) times daily.  Marland Kitchen COUMADIN 2.5 MG tablet TAKE AS DIRECTED.  Marland Kitchen levothyroxine (SYNTHROID, LEVOTHROID) 150 MCG tablet Take 150 mcg by mouth daily before breakfast.  . Misc Natural Products (GLUCOSAMINE CHONDROITIN COMPLX) TABS Take 1 tablet by mouth 2 (two) times daily.   . Multiple Vitamins-Minerals (OCUVITE ADULT 50+ PO) Take 1 tablet by mouth daily.  . [DISCONTINUED] AMPICILLIN PO Take by mouth 2 (two) times daily.   No facility-administered encounter medications on file as of 03/23/2015.  :  Review of Systems:  Out of a complete 14 point review of systems, all are reviewed and negative with the exception of these symptoms as listed below:   Review of Systems  Neurological:       Patient reports that her legs feel more weak and she is having more leg pain bilaterally.     Objective:  Neurologic Exam  Physical Exam Physical Examination:   Filed Vitals:   03/23/15 1141  BP: 128/62  Pulse: 68  Resp: 16   General Examination: The patient is a very pleasant  82 y.o. female in no acute distress. She appears well-developed and well-nourished and very well groomed. She is in good spirits today, as is her usual.  HEENT: Normocephalic, atraumatic, pupils are equal, round and reactive to light  and accommodation. She is visually impaired bilaterally. She is status post right cataract repair and has a cataract on the left. Extraocular tracking is fair without nystagmus noted. Normal smooth pursuit is noted. Hearing is impaired. Face is symmetric with normal facial animation and normal facial sensation. Speech is clear with no dysarthria noted. There is no hypophonia. There is an unchanged lower lip and jaw tremor, and a no-no type neck/head, and voice tremor, slightly worse. Neck is supple with decrease in range of motion. There are no carotid bruits on auscultation. Oropharynx exam reveals: mild mouth dryness, adequate dental hygiene and no significant airway crowding.  Chest: Clear to auscultation without wheezing, rhonchi or crackles noted.  Heart: S1+S2+0, regular and normal without murmurs, rubs or gallops noted.   Abdomen: Soft, non-tender and non-distended with normal bowel sounds appreciated on auscultation.  Extremities: There is no pitting edema in the distal lower extremities bilaterally. Pedal pulses are intact. she has unremarkable scars from bilateral knee replacement surgeries.  Skin: Warm and dry without trophic changes noted. There are no varicose veins.  Musculoskeletal: exam reveals no obvious joint deformities, tenderness or joint swelling or erythema, except for arthritic changes in both hands, right more than left. She has scoliosis. She wears a brace. She has an abnormal neck position and on equal shoulder height with left higher than right.   Neurologically:  Mental status: The patient is awake, alert and oriented in all 4 spheres. Her memory, attention, language and knowledge are appropriate. There is no aphasia, agnosia, apraxia or  anomia. Speech is clear with normal prosody and enunciation. Thought process is linear. Mood is congruent and affect is normal.  Cranial nerves are as described above under HEENT exam. Motor exam: Normal bulk, strength for age and tone is noted, with the exception of mild grip weakness bilaterally, right more so than left. There is no drift, tremor or rebound. Romberg is negative. Reflexes are 1+ throughout, absent in the knees and ankles. Fine motor skills are fairly well preserved.  Cerebellar testing shows no dysmetria or intention tremor on finger to nose testing. There is no truncal or gait ataxia.  Sensory exam is intact to light touch, pinprick, vibration, temperature sense in the upper extremities and decreased to all modalities in the LEs, unchanged, up to the knees, left a little better than right.    Gait, station and balance: she stands with difficulty and pushes herself up. She leans to the R. Posture is mild to moderately stooped for age. She has scoliosis. Stance is slightly wide-based. She cannot do tandem walk. She wears a soft back brace around the lower back. She uses her rolling walker fairly well, but has a tendency to turn too fast, unchanged.  Assessment and Plan:   In summary, [name] is a very pleasant 82 year old female with a history of peripheral neuropathy, chronic degenerative spine disease, blindness, hearing loss, vertigo, and essential tremor and a gait disorder who presents for follow-up consultation of her gait disorder and essential tremor. She reported right-sided headaches which have since then resolved especially after she was given a mouth guard and was told to use a special pillow to help with her TMJ issues. We did blood work in the recent past, which were fine. Unfortunately, there is not a whole lot I can offer at this time. We have previously talked about the symptomatic treatment options for tremors but given her age, her other medications, her fall risk,  her visual impairment, and  her gait disorder, I don't think anything that we could try for tremor would be safe in her case. She reports or weakness and deconditioning. She is advised to continue to try to stay active within her limitations. She is advised to turn slowly when using her walker and use her walker at all times. She has always been a very active woman and is encouraged to continue to try to stay active. She is advised to drink enough water, eat a healthful diet with enough protein. She is fairly stable from the physical exam standpoint but certainly she may have worsening of her overall deconditioning, tremors, gait disorder with time. We talked about this again today. I would be happy to recheck on her in about 6 months, sooner if needed. She had a brain MRI without contrast which did not show any acute findings. Her gait disorder is likely secondary to spinal stenosis, peripheral neuropathy, tremors, visual impairment, hearing impairment, aging, abnormal posture and I encouraged her to continue with strengthening and range of motion exercises for her upper and lower body. I answered all their questions today and the patient and her daughter were in agreement. I spent 25 minutes in total face-to-face time with the patient, more than 50% of which was spent in counseling and coordination of care, reviewing test results, reviewing medication and discussing or reviewing the diagnosis of  gait disorder and tremors, the prognosis and treatment options.

## 2015-03-25 ENCOUNTER — Emergency Department (HOSPITAL_BASED_OUTPATIENT_CLINIC_OR_DEPARTMENT_OTHER): Payer: Medicare Other

## 2015-03-25 ENCOUNTER — Emergency Department (HOSPITAL_BASED_OUTPATIENT_CLINIC_OR_DEPARTMENT_OTHER)
Admission: EM | Admit: 2015-03-25 | Discharge: 2015-03-25 | Disposition: A | Discharge status: Home / Self Care | Payer: Medicare Other | Attending: Emergency Medicine | Admitting: Emergency Medicine

## 2015-03-25 ENCOUNTER — Encounter (HOSPITAL_BASED_OUTPATIENT_CLINIC_OR_DEPARTMENT_OTHER): Payer: Self-pay | Admitting: *Deleted

## 2015-03-25 DIAGNOSIS — N184 Chronic kidney disease, stage 4 (severe): Secondary | ICD-10-CM | POA: Diagnosis not present

## 2015-03-25 DIAGNOSIS — H540 Blindness, both eyes: Secondary | ICD-10-CM | POA: Insufficient documentation

## 2015-03-25 DIAGNOSIS — Z8679 Personal history of other diseases of the circulatory system: Secondary | ICD-10-CM | POA: Insufficient documentation

## 2015-03-25 DIAGNOSIS — Z7901 Long term (current) use of anticoagulants: Secondary | ICD-10-CM | POA: Diagnosis not present

## 2015-03-25 DIAGNOSIS — Z8739 Personal history of other diseases of the musculoskeletal system and connective tissue: Secondary | ICD-10-CM | POA: Insufficient documentation

## 2015-03-25 DIAGNOSIS — K56609 Unspecified intestinal obstruction, unspecified as to partial versus complete obstruction: Secondary | ICD-10-CM

## 2015-03-25 DIAGNOSIS — E049 Nontoxic goiter, unspecified: Secondary | ICD-10-CM | POA: Diagnosis not present

## 2015-03-25 DIAGNOSIS — K5669 Other intestinal obstruction: Secondary | ICD-10-CM | POA: Diagnosis not present

## 2015-03-25 DIAGNOSIS — R269 Unspecified abnormalities of gait and mobility: Secondary | ICD-10-CM | POA: Diagnosis not present

## 2015-03-25 DIAGNOSIS — E039 Hypothyroidism, unspecified: Secondary | ICD-10-CM | POA: Diagnosis not present

## 2015-03-25 DIAGNOSIS — Z86711 Personal history of pulmonary embolism: Secondary | ICD-10-CM | POA: Insufficient documentation

## 2015-03-25 DIAGNOSIS — K5730 Diverticulosis of large intestine without perforation or abscess without bleeding: Secondary | ICD-10-CM | POA: Diagnosis not present

## 2015-03-25 DIAGNOSIS — R140 Abdominal distension (gaseous): Secondary | ICD-10-CM

## 2015-03-25 DIAGNOSIS — R1084 Generalized abdominal pain: Secondary | ICD-10-CM | POA: Diagnosis present

## 2015-03-25 DIAGNOSIS — K56600 Partial intestinal obstruction, unspecified as to cause: Secondary | ICD-10-CM

## 2015-03-25 LAB — URINALYSIS, ROUTINE W REFLEX MICROSCOPIC
BILIRUBIN URINE: NEGATIVE
Glucose, UA: NEGATIVE mg/dL
Hgb urine dipstick: NEGATIVE
KETONES UR: NEGATIVE mg/dL
Leukocytes, UA: NEGATIVE
NITRITE: NEGATIVE
PROTEIN: NEGATIVE mg/dL
SPECIFIC GRAVITY, URINE: 1.023 (ref 1.005–1.030)
pH: 6 (ref 5.0–8.0)

## 2015-03-25 LAB — BASIC METABOLIC PANEL
Anion gap: 12 (ref 5–15)
BUN: 37 mg/dL — AB (ref 6–20)
CO2: 25 mmol/L (ref 22–32)
Calcium: 9.3 mg/dL (ref 8.9–10.3)
Chloride: 101 mmol/L (ref 101–111)
Creatinine, Ser: 1.31 mg/dL — ABNORMAL HIGH (ref 0.44–1.00)
GFR, EST AFRICAN AMERICAN: 40 mL/min — AB (ref >60)
GFR, EST NON AFRICAN AMERICAN: 34 mL/min — AB (ref >60)
Glucose, Bld: 136 mg/dL — ABNORMAL HIGH (ref 65–99)
Potassium: 4.2 mmol/L (ref 3.5–5.1)
Sodium: 138 mmol/L (ref 135–145)

## 2015-03-25 LAB — CBC WITH DIFFERENTIAL/PLATELET
BASOS PCT: 0 %
Basophils Absolute: 0 10*3/uL (ref 0.0–0.1)
EOS ABS: 0 10*3/uL (ref 0.0–0.7)
EOS PCT: 0 %
HCT: 42.2 % (ref 36.0–46.0)
Hemoglobin: 13.7 g/dL (ref 12.0–15.0)
LYMPHS ABS: 0.5 10*3/uL — AB (ref 0.7–4.0)
Lymphocytes Relative: 4 %
MCH: 29.9 pg (ref 26.0–34.0)
MCHC: 32.5 g/dL (ref 30.0–36.0)
MCV: 92.1 fL (ref 78.0–100.0)
Monocytes Absolute: 0.7 10*3/uL (ref 0.1–1.0)
Monocytes Relative: 6 %
Neutro Abs: 9.6 10*3/uL — ABNORMAL HIGH (ref 1.7–7.7)
Neutrophils Relative %: 90 %
PLATELETS: 236 10*3/uL (ref 150–400)
RBC: 4.58 MIL/uL (ref 3.87–5.11)
RDW: 14.5 % (ref 11.5–15.5)
WBC: 10.7 10*3/uL — AB (ref 4.0–10.5)

## 2015-03-25 LAB — PROTIME-INR
INR: 2.33 — ABNORMAL HIGH (ref 0.00–1.49)
PROTHROMBIN TIME: 25.3 s — AB (ref 11.6–15.2)

## 2015-03-25 MED ORDER — IOHEXOL 300 MG/ML  SOLN
25.0000 mL | Freq: Once | INTRAMUSCULAR | Status: AC | PRN
  Administered 2015-03-25: 25 mL via ORAL

## 2015-03-25 NOTE — ED Provider Notes (Addendum)
CSN: [id number]     Arrival date & time 03/25/15  1211 History   First MD Initiated Contact with Patient 03/25/15 1308     Chief Complaint  Patient presents with  . Abdominal Pain     (Consider location/radiation/quality/duration/timing/severity/associated sxs/prior Treatment) HPI Complains of diffuse abdominal pain onset last night , with abdomen distended. Felt like partial small bowel obstruction she's had in the past. Associated symptoms include vomiting 7 times since onset of pain. She is no longer nauseated. She had a normal bowel movement in the emergency department prior to my exam . Her abdomen continues to feel distended however less so than earlier this morning. And she feels improved. She denies fever. Denies other associated symptoms. No treatment prior to coming here Past Medical History  Diagnosis Date  . Hypothyroidism   . Colitis   . Blood clot associated with vein wall inflammation   . Pulmonary embolus (HCC)     and DVT s/p IVC filter  . Paroxysmal atrial fibrillation (HCC)   . Blindness   . SBO (small bowel obstruction) ([place])   . Temporal arteritis ([place])   . Goiter     s/p radioactive iod. tx  . Spinal stenosis of lumbar region with radiculopathy     possible neurogenic claudication recently?  Marland Kitchen PAF (paroxysmal atrial fibrillation) ([place])   . CKD (chronic kidney disease), stage IV [facility])    Past Surgical History  Procedure Laterality Date  . Back surgery  04/1998    spinal stenosis  . Back surgery  2000    Ruptured Disc  . Total knee arthroplasty Left 04/1999  . Total knee arthroplasty Right 02/2000  . Carpal tunnel release  2002  . Appendectomy  1940  . Partial hysterectomy    . Tonsillectomy  1934  . Breast biopsy      cysts in both breasts  . Ivc filter     Family History  Problem Relation Age of Onset  . Breast cancer Sister   . Crohn's disease Neg Hx   . Ulcerative colitis Neg Hx   . Congestive Heart Failure Mother   . Stroke Father    Social  History  Substance Use Topics  . Smoking status: Never Smoker   . Smokeless tobacco: Never Used  . Alcohol Use: No   OB History    No data available     Review of Systems  Gastrointestinal: Positive for vomiting and abdominal pain.       No nausea at present  Musculoskeletal: Positive for gait problem.       Walks with walker  All other systems reviewed and are negative.     Allergies  Cefdinir; Cortisone; Prednisone; Sulfasalazine; and Sulfonamide derivatives  Home Medications   Prior to Admission medications   Medication Sig Start Date End Date Taking? Authorizing Provider  amiodarone (PACERONE) 200 MG tablet Take 100 mg by mouth daily.    Historical Provider, MD  Calcium Carb-Cholecalciferol (CALCIUM 600 + D) 600-200 MG-UNIT TABS Take 1 tablet by mouth 3 (three) times daily.    Historical Provider, MD  COUMADIN 2.5 MG tablet TAKE AS DIRECTED. 02/27/15   Belva Crome, MD  levothyroxine (SYNTHROID, LEVOTHROID) 150 MCG tablet Take 150 mcg by mouth daily before breakfast.    Historical Provider, MD  Misc Natural Products (GLUCOSAMINE CHONDROITIN COMPLX) TABS Take 1 tablet by mouth 2 (two) times daily.     Historical Provider, MD  Multiple Vitamins-Minerals (OCUVITE ADULT 50+ PO) Take 1 tablet by  mouth daily.    Historical Provider, MD   BP 152/57 mmHg  Pulse 80  Temp(Src) 98 F (36.7 C) (Oral)  Resp 18  Ht 5\' 6"  (1.676 m)  Wt 140 lb (63.504 kg)  BMI 22.61 kg/m2  SpO2 98% Physical Exam  Constitutional: She is oriented to person, place, and time. She appears well-developed and well-nourished.  HENT:  Head: Normocephalic and atraumatic.  Eyes: Conjunctivae are normal. Pupils are equal, round, and reactive to light.  Neck: Neck supple. No tracheal deviation present. No thyromegaly present.  Cardiovascular: Normal rate and regular rhythm.   No murmur heard. Pulmonary/Chest: Effort normal and breath sounds normal.  Abdominal: Soft. Bowel sounds are normal. She exhibits  distension. There is no tenderness.  Musculoskeletal: Normal range of motion. She exhibits no edema or tenderness.  Neurological: She is alert and oriented to person, place, and time. Coordination normal.  Skin: Skin is warm and dry. No rash noted.  Psychiatric: She has a normal mood and affect.  Nursing note and vitals reviewed.   ED Course  Procedures (including critical care time) Labs Review Labs Reviewed  URINALYSIS, ROUTINE W REFLEX MICROSCOPIC (NOT AT [facility])    Imaging Review No results found. I have personally reviewed and evaluated these images and lab results as part of my medical decision-making.   EKG Interpretation None     3:20 PM patient is asymptomatic and pain-free. She states her abdomen feels less tight. She is able to drink water without nausea vomiting or any discomfort whatsoever. Results for orders placed or performed during the hospital encounter of 03/25/15  Urinalysis, Routine w reflex microscopic (not at [facility])  Result Value Ref Range   Color, Urine YELLOW YELLOW   APPearance CLEAR CLEAR   Specific Gravity, Urine 1.023 1.005 - 1.030   pH 6.0 5.0 - 8.0   Glucose, UA NEGATIVE NEGATIVE mg/dL   Hgb urine dipstick NEGATIVE NEGATIVE   Bilirubin Urine NEGATIVE NEGATIVE   Ketones, ur NEGATIVE NEGATIVE mg/dL   Protein, ur NEGATIVE NEGATIVE mg/dL   Nitrite NEGATIVE NEGATIVE   Leukocytes, UA NEGATIVE NEGATIVE  CBC with Differential/Platelet  Result Value Ref Range   WBC 10.7 (H) 4.0 - 10.5 K/uL   RBC 4.58 3.87 - 5.11 MIL/uL   Hemoglobin 13.7 12.0 - 15.0 g/dL   HCT 42.2 36.0 - 46.0 %   MCV 92.1 78.0 - 100.0 fL   MCH 29.9 26.0 - 34.0 pg   MCHC 32.5 30.0 - 36.0 g/dL   RDW 14.5 11.5 - 15.5 %   Platelets 236 150 - 400 K/uL   Neutrophils Relative % 90 %   Neutro Abs 9.6 (H) 1.7 - 7.7 K/uL   Lymphocytes Relative 4 %   Lymphs Abs 0.5 (L) 0.7 - 4.0 K/uL   Monocytes Relative 6 %   Monocytes Absolute 0.7 0.1 - 1.0 K/uL   Eosinophils Relative 0 %    Eosinophils Absolute 0.0 0.0 - 0.7 K/uL   Basophils Relative 0 %   Basophils Absolute 0.0 0.0 - 0.1 K/uL  Basic metabolic panel  Result Value Ref Range   Sodium 138 135 - 145 mmol/L   Potassium 4.2 3.5 - 5.1 mmol/L   Chloride 101 101 - 111 mmol/L   CO2 25 22 - 32 mmol/L   Glucose, Bld 136 (H) 65 - 99 mg/dL   BUN 37 (H) 6 - 20 mg/dL   Creatinine, Ser 1.31 (H) 0.44 - 1.00 mg/dL   Calcium 9.3 8.9 - 10.3 mg/dL  GFR calc non Af Amer 34 (L) >60 mL/min   GFR calc Af Amer 40 (L) >60 mL/min   Anion gap 12 5 - 15  Protime-INR  Result Value Ref Range   Prothrombin Time 25.3 (H) 11.6 - 15.2 seconds   INR 2.33 (H) 0.00 - 1.49   Ct Abdomen Pelvis Wo Contrast  03/25/2015  CLINICAL DATA:  82 year old female with suspected small bowel obstruction. Vomiting and abdominal pain abdominal distention. Elevated white blood cell count. EXAM: CT ABDOMEN AND PELVIS WITHOUT CONTRAST TECHNIQUE: Multidetector CT imaging of the abdomen and pelvis was performed following the standard protocol without IV contrast. COMPARISON:  CT the abdomen and pelvis 01/17/2013. FINDINGS: Lower chest: Extensive airspace consolidation in the visualize lung bases, most severe in the left lower lobe, concerning for multi lobar pneumonia or sequela of aspiration. Atherosclerotic calcifications in the left anterior descending and right coronary arteries. Hepatobiliary: No definite cystic or solid hepatic lesions are confidently identified on today's noncontrast CT examination. Tiny calcified granuloma in the left lobe of the liver incidentally noted. Unenhanced appearance of the gallbladder is normal. Pancreas: Fatty atrophy in the pancreas. No definite pancreatic mass or peripancreatic inflammatory changes on today's noncontrast CT examination. Spleen: Unremarkable. Adrenals/Urinary Tract: Unenhanced appearance of the kidneys and bilateral adrenal glands is unremarkable. No hydroureteronephrosis. Urinary bladder is normal in appearance.  Stomach/Bowel: Unenhanced appearance of the stomach is normal. Diverticulum of the third portion of the duodenum incidentally noted. There multiple borderline dilated loops of mid small bowel measuring up to 3.3 cm in diameter, predominantly involving the jejunum. In the right side of the abdomen, either in the distal jejunum or proximal ileum there is a completely decompressed segment of small bowel (image 58 of series 2). Beyond this, small bowel loops appear decompressed. There is gas and stool scattered throughout the colon including the distal rectum. Numerous colonic diverticulae are noted, most evident in the sigmoid colon, without definite surrounding inflammatory changes to suggest an acute diverticulitis at this time. Vascular/Lymphatic: Extensive atherosclerotic plaque is noted throughout the abdominal and pelvic vasculature, without definite aneurysm. Central venous filter in position which appears to extend from the right common iliac vein into the inferior aspect of the inferior vena cava. This is unchanged. No definite lymphadenopathy noted in the abdomen or pelvis on today's noncontrast CT examination. Reproductive: Uterus and ovaries are unremarkable in appearance. Other: Trace volume of ascites. No larger volume of ascites. No pneumoperitoneum. Musculoskeletal: There are no aggressive appearing lytic or blastic lesions noted in the visualized portions of the skeleton. IMPRESSION: 1. Multiple borderline dilated loops of small bowel through the jejunum. Distal small bowel is relatively decompressed. However, there is gas and stool noted throughout the colon and rectum. Findings suggest potential early or partial small bowel obstruction. Transition to decompressed loops of small bowel is noted in the right side of the abdomen, likely in the distal jejunum or proximal ileum, as above. 2. Colonic diverticulosis without evidence of acute diverticulitis at this time. 3. Trace volume of ascites. 4.   Atherosclerosis, including 2 vessel coronary artery disease. 5. Additional incidental findings, as above. Electronically Signed   By: Vinnie Langton M.D.   On: 03/25/2015 14:36   Results for orders placed or performed during the hospital encounter of 03/25/15  Urinalysis, Routine w reflex microscopic (not at [facility])  Result Value Ref Range   Color, Urine YELLOW YELLOW   APPearance CLEAR CLEAR   Specific Gravity, Urine 1.023 1.005 - 1.030   pH 6.0  5.0 - 8.0   Glucose, UA NEGATIVE NEGATIVE mg/dL   Hgb urine dipstick NEGATIVE NEGATIVE   Bilirubin Urine NEGATIVE NEGATIVE   Ketones, ur NEGATIVE NEGATIVE mg/dL   Protein, ur NEGATIVE NEGATIVE mg/dL   Nitrite NEGATIVE NEGATIVE   Leukocytes, UA NEGATIVE NEGATIVE  CBC with Differential/Platelet  Result Value Ref Range   WBC 10.7 (H) 4.0 - 10.5 K/uL   RBC 4.58 3.87 - 5.11 MIL/uL   Hemoglobin 13.7 12.0 - 15.0 g/dL   HCT 42.2 36.0 - 46.0 %   MCV 92.1 78.0 - 100.0 fL   MCH 29.9 26.0 - 34.0 pg   MCHC 32.5 30.0 - 36.0 g/dL   RDW 14.5 11.5 - 15.5 %   Platelets 236 150 - 400 K/uL   Neutrophils Relative % 90 %   Neutro Abs 9.6 (H) 1.7 - 7.7 K/uL   Lymphocytes Relative 4 %   Lymphs Abs 0.5 (L) 0.7 - 4.0 K/uL   Monocytes Relative 6 %   Monocytes Absolute 0.7 0.1 - 1.0 K/uL   Eosinophils Relative 0 %   Eosinophils Absolute 0.0 0.0 - 0.7 K/uL   Basophils Relative 0 %   Basophils Absolute 0.0 0.0 - 0.1 K/uL  Basic metabolic panel  Result Value Ref Range   Sodium 138 135 - 145 mmol/L   Potassium 4.2 3.5 - 5.1 mmol/L   Chloride 101 101 - 111 mmol/L   CO2 25 22 - 32 mmol/L   Glucose, Bld 136 (H) 65 - 99 mg/dL   BUN 37 (H) 6 - 20 mg/dL   Creatinine, Ser 1.31 (H) 0.44 - 1.00 mg/dL   Calcium 9.3 8.9 - 10.3 mg/dL   GFR calc non Af Amer 34 (L) >60 mL/min   GFR calc Af Amer 40 (L) >60 mL/min   Anion gap 12 5 - 15  Protime-INR  Result Value Ref Range   Prothrombin Time 25.3 (H) 11.6 - 15.2 seconds   INR 2.33 (H) 0.00 - 1.49   Ct Abdomen Pelvis  Wo Contrast  03/25/2015  CLINICAL DATA:  82 year old female with suspected small bowel obstruction. Vomiting and abdominal pain abdominal distention. Elevated white blood cell count. EXAM: CT ABDOMEN AND PELVIS WITHOUT CONTRAST TECHNIQUE: Multidetector CT imaging of the abdomen and pelvis was performed following the standard protocol without IV contrast. COMPARISON:  CT the abdomen and pelvis 01/17/2013. FINDINGS: Lower chest: Extensive airspace consolidation in the visualize lung bases, most severe in the left lower lobe, concerning for multi lobar pneumonia or sequela of aspiration. Atherosclerotic calcifications in the left anterior descending and right coronary arteries. Hepatobiliary: No definite cystic or solid hepatic lesions are confidently identified on today's noncontrast CT examination. Tiny calcified granuloma in the left lobe of the liver incidentally noted. Unenhanced appearance of the gallbladder is normal. Pancreas: Fatty atrophy in the pancreas. No definite pancreatic mass or peripancreatic inflammatory changes on today's noncontrast CT examination. Spleen: Unremarkable. Adrenals/Urinary Tract: Unenhanced appearance of the kidneys and bilateral adrenal glands is unremarkable. No hydroureteronephrosis. Urinary bladder is normal in appearance. Stomach/Bowel: Unenhanced appearance of the stomach is normal. Diverticulum of the third portion of the duodenum incidentally noted. There multiple borderline dilated loops of mid small bowel measuring up to 3.3 cm in diameter, predominantly involving the jejunum. In the right side of the abdomen, either in the distal jejunum or proximal ileum there is a completely decompressed segment of small bowel (image 58 of series 2). Beyond this, small bowel loops appear decompressed. There is gas and stool scattered  throughout the colon including the distal rectum. Numerous colonic diverticulae are noted, most evident in the sigmoid colon, without definite surrounding  inflammatory changes to suggest an acute diverticulitis at this time. Vascular/Lymphatic: Extensive atherosclerotic plaque is noted throughout the abdominal and pelvic vasculature, without definite aneurysm. Central venous filter in position which appears to extend from the right common iliac vein into the inferior aspect of the inferior vena cava. This is unchanged. No definite lymphadenopathy noted in the abdomen or pelvis on today's noncontrast CT examination. Reproductive: Uterus and ovaries are unremarkable in appearance. Other: Trace volume of ascites. No larger volume of ascites. No pneumoperitoneum. Musculoskeletal: There are no aggressive appearing lytic or blastic lesions noted in the visualized portions of the skeleton. IMPRESSION: 1. Multiple borderline dilated loops of small bowel through the jejunum. Distal small bowel is relatively decompressed. However, there is gas and stool noted throughout the colon and rectum. Findings suggest potential early or partial small bowel obstruction. Transition to decompressed loops of small bowel is noted in the right side of the abdomen, likely in the distal jejunum or proximal ileum, as above. 2. Colonic diverticulosis without evidence of acute diverticulitis at this time. 3. Trace volume of ascites. 4.  Atherosclerosis, including 2 vessel coronary artery disease. 5. Additional incidental findings, as above. Electronically Signed   By: Vinnie Langton M.D.   On: 03/25/2015 14:36    MDM  I suspect the patient clinically has partial small bowel obstruction which is in the process of alleviating itself. I offered hospitalization and observation to both patient and her daughter which she declines. She would rather go home with clear liquid diet. She can return if symptoms worsen. Patient is not clinically dehydrated. Renal insufficency is chronic Final diagnoses:  None   Plan clear liquid diet for 24 hours. Return or contact PMDOr gastroenterologist, Dr.Magod  if symptoms worsen Partial small bowel obstruction    Orlie Dakin, MD 03/25/15 [place], MD 03/25/15 1536

## 2015-03-25 NOTE — ED Notes (Addendum)
Pt reports abdominal pain and bloating that started last night at 9pm.  Reports vomiting, denies diarrhea, reports hx of partial SBO.  Had 3 small bowel movements last night.  Denies pain, bloating at this time.  Pt appears weak.

## 2015-03-25 NOTE — Discharge Instructions (Signed)
Stay on clear liquids such as juice, Jell-O, broth for the next 24 hours. Then you may gradually increase your diet the following day to a soft diet such as scrambled eggs, rice, and toast. Return to the emergency department if you develop worsening abdominal pain, distention or vomiting, or contact your primary care physician or Dr. Watt Climes

## 2015-03-28 ENCOUNTER — Telehealth: Payer: Self-pay | Admitting: Interventional Cardiology

## 2015-03-28 ENCOUNTER — Ambulatory Visit
Admission: RE | Admit: 2015-03-28 | Discharge: 2015-03-28 | Disposition: A | Discharge status: Home / Self Care | Payer: Medicare Other | Source: Ambulatory Visit | Attending: Interventional Cardiology | Admitting: Interventional Cardiology

## 2015-03-28 DIAGNOSIS — Z79899 Other long term (current) drug therapy: Secondary | ICD-10-CM

## 2015-03-28 DIAGNOSIS — R05 Cough: Secondary | ICD-10-CM | POA: Diagnosis not present

## 2015-03-28 NOTE — Telephone Encounter (Signed)
New message      Pt had chest xray today because of amiodarone.  Pt had obstruction and vomiting Saturday and went to the ER.  It cleared itself up and was sent home.  She also developed a cough over the weekend and they are looking at the xray for pneumonia.  Daughter wanted Dr [name] to be aware of these new developments.

## 2015-03-28 NOTE — Telephone Encounter (Signed)
Update fwd to Dr.Smith 

## 2015-03-30 DIAGNOSIS — J181 Lobar pneumonia, unspecified organism: Secondary | ICD-10-CM | POA: Diagnosis not present

## 2015-03-30 DIAGNOSIS — R05 Cough: Secondary | ICD-10-CM | POA: Diagnosis not present

## 2015-03-30 DIAGNOSIS — K5669 Other intestinal obstruction: Secondary | ICD-10-CM | POA: Diagnosis not present

## 2015-03-30 DIAGNOSIS — Z8719 Personal history of other diseases of the digestive system: Secondary | ICD-10-CM | POA: Diagnosis not present

## 2015-03-30 DIAGNOSIS — R202 Paresthesia of skin: Secondary | ICD-10-CM | POA: Diagnosis not present

## 2015-03-30 DIAGNOSIS — R933 Abnormal findings on diagnostic imaging of other parts of digestive tract: Secondary | ICD-10-CM | POA: Diagnosis not present

## 2015-03-31 ENCOUNTER — Emergency Department (HOSPITAL_COMMUNITY)
Admission: EM | Admit: 2015-03-31 | Discharge: 2015-03-31 | Disposition: A | Discharge status: Home / Self Care | Payer: Medicare Other | Attending: Emergency Medicine | Admitting: Emergency Medicine

## 2015-03-31 ENCOUNTER — Encounter (HOSPITAL_COMMUNITY): Payer: Self-pay | Admitting: Emergency Medicine

## 2015-03-31 ENCOUNTER — Emergency Department (HOSPITAL_COMMUNITY): Payer: Medicare Other

## 2015-03-31 DIAGNOSIS — Z79899 Other long term (current) drug therapy: Secondary | ICD-10-CM | POA: Insufficient documentation

## 2015-03-31 DIAGNOSIS — M7989 Other specified soft tissue disorders: Secondary | ICD-10-CM | POA: Diagnosis not present

## 2015-03-31 DIAGNOSIS — Z86711 Personal history of pulmonary embolism: Secondary | ICD-10-CM | POA: Diagnosis not present

## 2015-03-31 DIAGNOSIS — M25531 Pain in right wrist: Secondary | ICD-10-CM | POA: Insufficient documentation

## 2015-03-31 DIAGNOSIS — N184 Chronic kidney disease, stage 4 (severe): Secondary | ICD-10-CM | POA: Insufficient documentation

## 2015-03-31 DIAGNOSIS — H547 Unspecified visual loss: Secondary | ICD-10-CM | POA: Diagnosis not present

## 2015-03-31 DIAGNOSIS — Z8679 Personal history of other diseases of the circulatory system: Secondary | ICD-10-CM | POA: Insufficient documentation

## 2015-03-31 DIAGNOSIS — R2231 Localized swelling, mass and lump, right upper limb: Secondary | ICD-10-CM | POA: Diagnosis not present

## 2015-03-31 DIAGNOSIS — E039 Hypothyroidism, unspecified: Secondary | ICD-10-CM | POA: Diagnosis not present

## 2015-03-31 DIAGNOSIS — Z8719 Personal history of other diseases of the digestive system: Secondary | ICD-10-CM | POA: Diagnosis not present

## 2015-03-31 DIAGNOSIS — M79641 Pain in right hand: Secondary | ICD-10-CM | POA: Diagnosis not present

## 2015-03-31 LAB — BASIC METABOLIC PANEL
Anion gap: 11 (ref 5–15)
BUN: 16 mg/dL (ref 6–20)
CO2: 23 mmol/L (ref 22–32)
Calcium: 8.7 mg/dL — ABNORMAL LOW (ref 8.9–10.3)
Chloride: 102 mmol/L (ref 101–111)
Creatinine, Ser: 1.12 mg/dL — ABNORMAL HIGH (ref 0.44–1.00)
GFR calc Af Amer: 48 mL/min — ABNORMAL LOW (ref >60)
GFR calc non Af Amer: 41 mL/min — ABNORMAL LOW (ref >60)
Glucose, Bld: 107 mg/dL — ABNORMAL HIGH (ref 65–99)
Potassium: 4.1 mmol/L (ref 3.5–5.1)
Sodium: 136 mmol/L (ref 135–145)

## 2015-03-31 LAB — CBC WITH DIFFERENTIAL/PLATELET
Basophils Absolute: 0 10*3/uL (ref 0.0–0.1)
Basophils Relative: 0 %
Eosinophils Absolute: 0.3 10*3/uL (ref 0.0–0.7)
Eosinophils Relative: 3 %
HCT: 35.4 % — ABNORMAL LOW (ref 36.0–46.0)
Hemoglobin: 11.9 g/dL — ABNORMAL LOW (ref 12.0–15.0)
Lymphocytes Relative: 13 %
Lymphs Abs: 1.2 10*3/uL (ref 0.7–4.0)
MCH: 29.7 pg (ref 26.0–34.0)
MCHC: 33.6 g/dL (ref 30.0–36.0)
MCV: 88.3 fL (ref 78.0–100.0)
Monocytes Absolute: 1.2 10*3/uL — ABNORMAL HIGH (ref 0.1–1.0)
Monocytes Relative: 14 %
Neutro Abs: 6.2 10*3/uL (ref 1.7–7.7)
Neutrophils Relative %: 70 %
Platelets: 270 10*3/uL (ref 150–400)
RBC: 4.01 MIL/uL (ref 3.87–5.11)
RDW: 13.9 % (ref 11.5–15.5)
WBC: 8.9 10*3/uL (ref 4.0–10.5)

## 2015-03-31 LAB — URIC ACID: Uric Acid, Serum: 3.3 mg/dL (ref 2.3–6.6)

## 2015-03-31 MED ORDER — HYDROCODONE-ACETAMINOPHEN 5-325 MG PO TABS
1.0000 | ORAL_TABLET | Freq: Once | ORAL | Status: AC
  Administered 2015-03-31: 1 via ORAL
  Filled 2015-03-31: qty 1

## 2015-03-31 MED ORDER — HYDROCODONE-ACETAMINOPHEN 5-325 MG PO TABS: 1.0000 | ORAL_TABLET | Freq: Four times a day (QID) | ORAL | Status: DC | PRN

## 2015-03-31 NOTE — Discharge Instructions (Signed)
Return here in 2 days for a recheck. Follow up with your orthopedist

## 2015-03-31 NOTE — ED Provider Notes (Signed)
CSN: [id number]     Arrival date & time 03/31/15  1757 History   First MD Initiated Contact with Patient 03/31/15 1923     Chief Complaint  Patient presents with  . Hand Pain  . Wrist Pain     (Consider location/radiation/quality/duration/timing/severity/associated sxs/prior Treatment) HPI The patient presents to the ER with R hand and wrist swelling since this morning. The patient states that the area is very tender. The pain is worse with movement and palpation. The patient denies neck pain, fever, numbness, weakness, SOB, CP, back pain, rash, or syncope. The patient did not take any medications for this prior to arrival. The patient states that she has had no trauma to the area. She denies any wounds or skin issues in the area. Past Medical History  Diagnosis Date  . Hypothyroidism   . Colitis   . Blood clot associated with vein wall inflammation   . Pulmonary embolus (HCC)     and DVT s/p IVC filter  . Paroxysmal atrial fibrillation (HCC)   . Blindness   . SBO (small bowel obstruction) ([place])   . Temporal arteritis ([place])   . Goiter     s/p radioactive iod. tx  . Spinal stenosis of lumbar region with radiculopathy     possible neurogenic claudication recently?  Marland Kitchen PAF (paroxysmal atrial fibrillation) ([place])   . CKD (chronic kidney disease), stage IV ([place])   . Renal insufficiency    Past Surgical History  Procedure Laterality Date  . Back surgery  04/1998    spinal stenosis  . Back surgery  2000    Ruptured Disc  . Total knee arthroplasty Left 04/1999  . Total knee arthroplasty Right 02/2000  . Carpal tunnel release  2002  . Appendectomy  1940  . Partial hysterectomy    . Tonsillectomy  1934  . Breast biopsy      cysts in both breasts  . Ivc filter     Family History  Problem Relation Age of Onset  . Breast cancer Sister   . Crohn's disease Neg Hx   . Ulcerative colitis Neg Hx   . Congestive Heart Failure Mother   . Stroke Father    Social History  Substance Use  Topics  . Smoking status: Never Smoker   . Smokeless tobacco: Never Used  . Alcohol Use: No   OB History    No data available     Review of Systems All other systems negative except as documented in the HPI. All pertinent positives and negatives as reviewed in the HPI.    Allergies  Cefdinir; Cortisone; Prednisone; Sulfasalazine; and Sulfonamide derivatives  Home Medications   Prior to Admission medications   Medication Sig Start Date End Date Taking? Authorizing Provider  amiodarone (PACERONE) 200 MG tablet Take 100 mg by mouth daily.   Yes Historical Provider, MD  amoxicillin-clavulanate (AUGMENTIN) 875-125 MG tablet Take 1 tablet by mouth every 12 (twelve) hours. ABT Start Date 03/30/15 & End Date 03/30/15.   Yes Historical Provider, MD  Calcium Carb-Cholecalciferol (CALCIUM 600 + D) 600-200 MG-UNIT TABS Take 1 tablet by mouth 3 (three) times daily.   Yes Historical Provider, MD  COUMADIN 2.5 MG tablet TAKE AS DIRECTED. Patient taking differently: Take 2.5 mg by mouth daily 02/27/15  Yes Belva Crome, MD  levothyroxine (SYNTHROID, LEVOTHROID) 150 MCG tablet Take 150 mcg by mouth daily before breakfast.   Yes Historical Provider, MD  Misc Natural Products (GLUCOSAMINE CHONDROITIN COMPLX) TABS Take 1 tablet by  mouth 2 (two) times daily.    Yes Historical Provider, MD  Multiple Vitamins-Minerals (OCUVITE ADULT 50+ PO) Take 1 tablet by mouth daily.   Yes Historical Provider, MD  HYDROcodone-acetaminophen (NORCO/VICODIN) 5-325 MG tablet Take 1 tablet by mouth every 6 (six) hours as needed for moderate pain. 03/31/15   Aryaman Haliburton, PA-C   BP 163/61 mmHg  Pulse 68  Temp(Src) 99.3 F (37.4 C) (Oral)  Resp 20  SpO2 96% Physical Exam  Constitutional: She appears well-developed and well-nourished. No distress.  HENT:  Head: Atraumatic.  Eyes: Pupils are equal, round, and reactive to light.  Cardiovascular: Normal rate, regular rhythm and normal heart sounds.  Exam reveals no  gallop and no friction rub.   No murmur heard. Pulmonary/Chest: Effort normal and breath sounds normal. No respiratory distress.  Musculoskeletal:       Right hand: She exhibits decreased range of motion, tenderness and swelling. She exhibits no bony tenderness, normal two-point discrimination, normal capillary refill, no deformity and no laceration. Normal sensation noted.       Hands:   ED Course  Procedures (including critical care time) Labs Review Labs Reviewed  CBC WITH DIFFERENTIAL/PLATELET - Abnormal; Notable for the following:    Hemoglobin 11.9 (*)    HCT 35.4 (*)    Monocytes Absolute 1.2 (*)    All other components within normal limits  BASIC METABOLIC PANEL - Abnormal; Notable for the following:    Glucose, Bld 107 (*)    Creatinine, Ser 1.12 (*)    Calcium 8.7 (*)    GFR calc non Af Amer 41 (*)    GFR calc Af Amer 48 (*)    All other components within normal limits  URIC ACID    Imaging Review Dg Hand Complete Right  03/31/2015  CLINICAL DATA:  Acute onset of right hand pain and medial wrist swelling. Limited range of motion. Initial encounter. EXAM: RIGHT HAND - COMPLETE 3+ VIEW COMPARISON:  None. FINDINGS: There is no evidence of fracture or dislocation. Mild degenerative change is noted at the first carpometacarpal joint, and at the ulnar aspect of the carpal rows. The carpal rows are otherwise intact, and demonstrate normal alignment. Dorsal soft tissue swelling is noted at the wrist. IMPRESSION: No evidence of fracture or dislocation. Mild degenerative change at the first carpometacarpal joint, and at the ulnar aspect of the carpal rows. Electronically Signed   By: Garald Balding M.D.   On: 03/31/2015 20:21   I have personally reviewed and evaluated these images and lab results as part of my medical decision-making.   EKG Interpretation None      MDM   Final diagnoses:  Swelling of right hand    The patient is asked to return here in 2 days for a recheck  of the area. I feel that this is most likely a gout type inflammatory issue rather than infection. I did advise the patient that this could still represent infection and close follow up is important. The patient is advised to follow up with her orthopedist. Patient agrees to the plan and all questions were answered.  Dalia Heading, PA-C 04/02/15 [place], MD 04/02/15 2251

## 2015-03-31 NOTE — ED Provider Notes (Signed)
Patient presents to the emergency room with complaints of acute swelling of her right wrist. Patient denies any injuries. Not having trouble with fevers or chills. Physical Exam  BP 157/90 mmHg  Pulse 75  Temp(Src) 98.2 F (36.8 C) (Oral)  Resp 16  SpO2 97%  Physical Exam  Constitutional: She appears well-developed. No distress.  HENT:  Head: Normocephalic and atraumatic.  Right Ear: External ear normal.  Left Ear: External ear normal.  Eyes: Conjunctivae are normal. Right eye exhibits no discharge. Left eye exhibits no discharge. No scleral icterus.  Neck: Neck supple. No tracheal deviation present.  Cardiovascular: Normal rate.   Pulmonary/Chest: Effort normal. No stridor. No respiratory distress.  Musculoskeletal: She exhibits edema and tenderness.       Right hand: She exhibits tenderness and swelling. She exhibits no laceration. Normal sensation noted.  Edema and increased warmth with swelling in the carpal region of the right hand, no lymphangitic streaking  Neurological: She is alert. Cranial nerve deficit: no gross deficits.  Skin: Skin is warm and dry. No rash noted.  Psychiatric: She has a normal mood and affect.  Nursing note and vitals reviewed.   ED Course  Procedures Dg Hand Complete Right  03/31/2015  CLINICAL DATA:  Acute onset of right hand pain and medial wrist swelling. Limited range of motion. Initial encounter. EXAM: RIGHT HAND - COMPLETE 3+ VIEW COMPARISON:  None. FINDINGS: There is no evidence of fracture or dislocation. Mild degenerative change is noted at the first carpometacarpal joint, and at the ulnar aspect of the carpal rows. The carpal rows are otherwise intact, and demonstrate normal alignment. Dorsal soft tissue swelling is noted at the wrist. IMPRESSION: No evidence of fracture or dislocation. Mild degenerative change at the first carpometacarpal joint, and at the ulnar aspect of the carpal rows. Electronically Signed   By: Garald Balding M.D.   On:  03/31/2015 20:21    MDM Doubt infection, septic arthritis.  No fever, wbc not elevated.  May be an inflammatory arthritis such as pseudogout.  Will dc with pain meds. MOnitor closely for worsening sx, redness, fever.  Return to be rechecked in 24 to 48 hours      Dorie Rank, MD 03/31/15 2143

## 2015-03-31 NOTE — ED Notes (Signed)
Pt from [place] after she awoke this am with sudden swelling, redness and warmth to her R thumb joint and R wrist joint. Pt adds that she has experienced numbness to the R arm and hand x1 year. Pt is currently being tx for LL lobe PNA dx 1 week ago. Pt denies injury or trauma to the affected extremity. Pt is A&O and in NAD

## 2015-04-03 ENCOUNTER — Ambulatory Visit (INDEPENDENT_AMBULATORY_CARE_PROVIDER_SITE_OTHER): Payer: Medicare Other | Admitting: *Deleted

## 2015-04-03 DIAGNOSIS — I482 Chronic atrial fibrillation, unspecified: Secondary | ICD-10-CM

## 2015-04-03 DIAGNOSIS — Z5181 Encounter for therapeutic drug level monitoring: Secondary | ICD-10-CM

## 2015-04-03 DIAGNOSIS — I4891 Unspecified atrial fibrillation: Secondary | ICD-10-CM | POA: Diagnosis not present

## 2015-04-03 DIAGNOSIS — M25531 Pain in right wrist: Secondary | ICD-10-CM | POA: Diagnosis not present

## 2015-04-03 LAB — POCT INR: INR: 6.4

## 2015-04-04 ENCOUNTER — Telehealth: Payer: Self-pay | Admitting: Pharmacist

## 2015-04-04 LAB — PROTIME-INR
INR: 5.35 — AB (ref 0.00–1.49)
PROTHROMBIN TIME: 47 s — AB (ref 11.6–15.2)

## 2015-04-04 NOTE — Telephone Encounter (Signed)
Spoke with dtr Mardene Celeste & she informed me that pt is on prednisone tapered dose of 4 tabs x 2 days, 3 tabs x 2 days, 2 tabs x 2 days, and 1 tab x 2 days.  Advised that the medication interferes with Coumadin & that the pt's INR was supratherapeutic on 04/03/15.  Patient has skipped Monday's  (04/03/15) and Tuesday's (04/04/15) dose of Coumadin as instructed in the office; now that she is on Prednisone tapered dose on Wednesday (04/05/15) she will take 1/2 tablet of Coumadin and 1/2 tablet of Coumadin on Thursday (3/23) and report for INR recheck on Friday at 145pm.  The dtr wrote the instructions down & read back to me and verbalized understanding.  Advised to call back with any other issues.

## 2015-04-04 NOTE — Telephone Encounter (Signed)
New Message  Pt dtr calling to speak w/ COUM- stated that since yesterday appt 3/20- pt has started prednisone and wanted to clarify dosage. Please call back and discuss.

## 2015-04-07 ENCOUNTER — Ambulatory Visit (INDEPENDENT_AMBULATORY_CARE_PROVIDER_SITE_OTHER): Payer: Medicare Other | Admitting: Pharmacist

## 2015-04-07 DIAGNOSIS — I482 Chronic atrial fibrillation, unspecified: Secondary | ICD-10-CM

## 2015-04-07 DIAGNOSIS — I4891 Unspecified atrial fibrillation: Secondary | ICD-10-CM

## 2015-04-07 DIAGNOSIS — Z5181 Encounter for therapeutic drug level monitoring: Secondary | ICD-10-CM | POA: Diagnosis not present

## 2015-04-07 LAB — POCT INR: INR: 2.1

## 2015-04-11 DIAGNOSIS — M13131 Monoarthritis, not elsewhere classified, right wrist: Secondary | ICD-10-CM | POA: Diagnosis not present

## 2015-04-11 DIAGNOSIS — M25531 Pain in right wrist: Secondary | ICD-10-CM | POA: Diagnosis not present

## 2015-04-20 ENCOUNTER — Ambulatory Visit (INDEPENDENT_AMBULATORY_CARE_PROVIDER_SITE_OTHER): Payer: Medicare Other | Admitting: *Deleted

## 2015-04-20 DIAGNOSIS — I4891 Unspecified atrial fibrillation: Secondary | ICD-10-CM

## 2015-04-20 DIAGNOSIS — Z5181 Encounter for therapeutic drug level monitoring: Secondary | ICD-10-CM

## 2015-04-20 DIAGNOSIS — I482 Chronic atrial fibrillation, unspecified: Secondary | ICD-10-CM

## 2015-04-20 LAB — POCT INR: INR: 2.9

## 2015-05-11 ENCOUNTER — Ambulatory Visit (INDEPENDENT_AMBULATORY_CARE_PROVIDER_SITE_OTHER): Payer: Medicare Other | Admitting: *Deleted

## 2015-05-11 DIAGNOSIS — I482 Chronic atrial fibrillation, unspecified: Secondary | ICD-10-CM

## 2015-05-11 DIAGNOSIS — Z5181 Encounter for therapeutic drug level monitoring: Secondary | ICD-10-CM

## 2015-05-11 DIAGNOSIS — I4891 Unspecified atrial fibrillation: Secondary | ICD-10-CM | POA: Diagnosis not present

## 2015-05-11 LAB — POCT INR: INR: 2.4

## 2015-05-18 ENCOUNTER — Encounter: Payer: Self-pay | Admitting: Podiatry

## 2015-05-18 ENCOUNTER — Ambulatory Visit (INDEPENDENT_AMBULATORY_CARE_PROVIDER_SITE_OTHER): Payer: Medicare Other | Admitting: Podiatry

## 2015-05-18 DIAGNOSIS — M79676 Pain in unspecified toe(s): Secondary | ICD-10-CM | POA: Diagnosis not present

## 2015-05-18 DIAGNOSIS — B351 Tinea unguium: Secondary | ICD-10-CM

## 2015-05-18 DIAGNOSIS — M79673 Pain in unspecified foot: Secondary | ICD-10-CM | POA: Diagnosis not present

## 2015-05-18 NOTE — Progress Notes (Signed)
Patient ID: [name], female   DOB: 07/17/1920, 82 y.o.   MRN: [id number] Complaint:  Visit Type: Patient returns to my office for continued preventative foot care services. Complaint: Patient states" my nails have grown long and thick and become painful to walk and wear shoes" . The patient presents for preventative foot care services. No changes to ROS  Podiatric Exam: Vascular: dorsalis pedis and posterior tibial pulses are palpable bilateral. Capillary return is immediate. Temperature gradient is WNL. Skin turgor WNL  Sensorium: Diminished  Semmes Weinstein monofilament test. Normal tactile sensation bilaterally. Nail Exam: Pt has thick disfigured discolored nails with subungual debris noted bilateral entire nail hallux through fifth toenails Ulcer Exam: There is no evidence of ulcer or pre-ulcerative changes or infection. Orthopedic Exam: Muscle tone and strength are WNL. No limitations in general ROM. No crepitus or effusions noted. Foot type and digits show no abnormalities. Bony prominences are unremarkable. Skin: No Porokeratosis. No infection or ulcers  Diagnosis:  Onychomycosis, , Pain in right toe, pain in left toes  Treatment & Plan Procedures and Treatment: Consent by patient was obtained for treatment procedures. The patient understood the discussion of treatment and procedures well. All questions were answered thoroughly reviewed. Debridement of mycotic and hypertrophic toenails, 1 through 5 bilateral and clearing of subungual debris. No ulceration, no infection noted.  Return Visit-Office Procedure: Patient instructed to return to the office for a follow up visit 3 months for continued evaluation and treatment.   Juandaniel Manfredo DPM 

## 2015-06-01 DIAGNOSIS — H353222 Exudative age-related macular degeneration, left eye, with inactive choroidal neovascularization: Secondary | ICD-10-CM | POA: Diagnosis not present

## 2015-06-01 DIAGNOSIS — H353212 Exudative age-related macular degeneration, right eye, with inactive choroidal neovascularization: Secondary | ICD-10-CM | POA: Diagnosis not present

## 2015-06-07 ENCOUNTER — Ambulatory Visit (INDEPENDENT_AMBULATORY_CARE_PROVIDER_SITE_OTHER): Payer: Medicare Other | Admitting: *Deleted

## 2015-06-07 DIAGNOSIS — I4891 Unspecified atrial fibrillation: Secondary | ICD-10-CM | POA: Diagnosis not present

## 2015-06-07 DIAGNOSIS — Z5181 Encounter for therapeutic drug level monitoring: Secondary | ICD-10-CM | POA: Diagnosis not present

## 2015-06-07 DIAGNOSIS — I482 Chronic atrial fibrillation, unspecified: Secondary | ICD-10-CM

## 2015-06-07 LAB — POCT INR: INR: 2.5

## 2015-06-27 DIAGNOSIS — K529 Noninfective gastroenteritis and colitis, unspecified: Secondary | ICD-10-CM | POA: Diagnosis not present

## 2015-07-03 DIAGNOSIS — I509 Heart failure, unspecified: Secondary | ICD-10-CM | POA: Diagnosis not present

## 2015-07-03 DIAGNOSIS — E039 Hypothyroidism, unspecified: Secondary | ICD-10-CM | POA: Diagnosis not present

## 2015-07-03 DIAGNOSIS — D649 Anemia, unspecified: Secondary | ICD-10-CM | POA: Diagnosis not present

## 2015-07-03 DIAGNOSIS — I4891 Unspecified atrial fibrillation: Secondary | ICD-10-CM | POA: Diagnosis not present

## 2015-07-03 DIAGNOSIS — E782 Mixed hyperlipidemia: Secondary | ICD-10-CM | POA: Diagnosis not present

## 2015-07-03 DIAGNOSIS — K529 Noninfective gastroenteritis and colitis, unspecified: Secondary | ICD-10-CM | POA: Diagnosis not present

## 2015-07-03 DIAGNOSIS — M25431 Effusion, right wrist: Secondary | ICD-10-CM | POA: Diagnosis not present

## 2015-07-03 DIAGNOSIS — Z8719 Personal history of other diseases of the digestive system: Secondary | ICD-10-CM | POA: Diagnosis not present

## 2015-07-03 DIAGNOSIS — Z1389 Encounter for screening for other disorder: Secondary | ICD-10-CM | POA: Diagnosis not present

## 2015-07-03 DIAGNOSIS — N183 Chronic kidney disease, stage 3 (moderate): Secondary | ICD-10-CM | POA: Diagnosis not present

## 2015-07-05 ENCOUNTER — Ambulatory Visit (INDEPENDENT_AMBULATORY_CARE_PROVIDER_SITE_OTHER): Payer: Medicare Other | Admitting: *Deleted

## 2015-07-05 DIAGNOSIS — I482 Chronic atrial fibrillation, unspecified: Secondary | ICD-10-CM

## 2015-07-05 DIAGNOSIS — Z5181 Encounter for therapeutic drug level monitoring: Secondary | ICD-10-CM | POA: Diagnosis not present

## 2015-07-05 DIAGNOSIS — I4891 Unspecified atrial fibrillation: Secondary | ICD-10-CM

## 2015-07-05 LAB — POCT INR: INR: 2.8

## 2015-07-06 ENCOUNTER — Encounter: Payer: Self-pay | Admitting: Interventional Cardiology

## 2015-07-06 DIAGNOSIS — M4806 Spinal stenosis, lumbar region: Secondary | ICD-10-CM | POA: Diagnosis not present

## 2015-07-06 DIAGNOSIS — M150 Primary generalized (osteo)arthritis: Secondary | ICD-10-CM | POA: Diagnosis not present

## 2015-07-06 DIAGNOSIS — M79642 Pain in left hand: Secondary | ICD-10-CM | POA: Diagnosis not present

## 2015-07-06 DIAGNOSIS — M79641 Pain in right hand: Secondary | ICD-10-CM | POA: Diagnosis not present

## 2015-07-06 DIAGNOSIS — R5383 Other fatigue: Secondary | ICD-10-CM | POA: Diagnosis not present

## 2015-07-06 DIAGNOSIS — G5601 Carpal tunnel syndrome, right upper limb: Secondary | ICD-10-CM | POA: Diagnosis not present

## 2015-07-06 DIAGNOSIS — M064 Inflammatory polyarthropathy: Secondary | ICD-10-CM | POA: Diagnosis not present

## 2015-07-17 ENCOUNTER — Other Ambulatory Visit: Payer: Self-pay | Admitting: Interventional Cardiology

## 2015-07-20 ENCOUNTER — Ambulatory Visit (INDEPENDENT_AMBULATORY_CARE_PROVIDER_SITE_OTHER): Payer: Medicare Other | Admitting: Podiatry

## 2015-07-20 ENCOUNTER — Encounter: Payer: Self-pay | Admitting: Podiatry

## 2015-07-20 DIAGNOSIS — B351 Tinea unguium: Secondary | ICD-10-CM | POA: Diagnosis not present

## 2015-07-20 DIAGNOSIS — M79676 Pain in unspecified toe(s): Secondary | ICD-10-CM | POA: Diagnosis not present

## 2015-07-20 NOTE — Progress Notes (Signed)
Patient ID: [name], female   DOB: 04/28/1920, 82 y.o.   MRN: [id number] Complaint:  Visit Type: Patient returns to my office for continued preventative foot care services. Complaint: Patient states" my nails have grown long and thick and become painful to walk and wear shoes" . The patient presents for preventative foot care services. No changes to ROS  Podiatric Exam: Vascular: dorsalis pedis and posterior tibial pulses are palpable bilateral. Capillary return is immediate. Temperature gradient is WNL. Skin turgor WNL  Sensorium: Diminished  Semmes Weinstein monofilament test. Normal tactile sensation bilaterally. Nail Exam: Pt has thick disfigured discolored nails with subungual debris noted bilateral entire nail hallux through fifth toenails Ulcer Exam: There is no evidence of ulcer or pre-ulcerative changes or infection. Orthopedic Exam: Muscle tone and strength are WNL. No limitations in general ROM. No crepitus or effusions noted. Foot type and digits show no abnormalities. Bony prominences are unremarkable. Skin: No Porokeratosis. No infection or ulcers  Diagnosis:  Onychomycosis, , Pain in right toe, pain in left toes  Treatment & Plan Procedures and Treatment: Consent by patient was obtained for treatment procedures. The patient understood the discussion of treatment and procedures well. All questions were answered thoroughly reviewed. Debridement of mycotic and hypertrophic toenails, 1 through 5 bilateral and clearing of subungual debris. No ulceration, no infection noted.  Return Visit-Office Procedure: Patient instructed to return to the office for a follow up visit 3 months for continued evaluation and treatment.   Leston Schueller DPM 

## 2015-07-30 NOTE — Progress Notes (Signed)
Cardiology Office Note    Date:  07/31/2015   ID:  [name], DOB 02/05/1920, MRN [id number]  PCP:  [name], MD  Cardiologist: Sinclair Grooms, MD   Chief Complaint  Patient presents with  . Atrial Fibrillation    History of Present Illness:  [name] is a 82 y.o. female paroxysmal atrial fibrillation, chronic amiodarone therapy, history of PE, chronic anticoagulation therapy, and chronic kidney disease.  Doing well. No syncope, palpitations, chest pain, edema, or volume overload complaints. Ambulates with a walker. No transient neurological symptoms.    Past Medical History  Diagnosis Date  . Hypothyroidism   . Colitis   . Blood clot associated with vein wall inflammation   . Pulmonary embolus (HCC)     and DVT s/p IVC filter  . Paroxysmal atrial fibrillation (HCC)   . Blindness   . SBO (small bowel obstruction) ([place])   . Temporal arteritis ([place])   . Goiter     s/p radioactive iod. tx  . Spinal stenosis of lumbar region with radiculopathy     possible neurogenic claudication recently?  Marland Kitchen PAF (paroxysmal atrial fibrillation) ([place])   . CKD (chronic kidney disease), stage IV ([place])   . Renal insufficiency     Past Surgical History  Procedure Laterality Date  . Back surgery  04/1998    spinal stenosis  . Back surgery  2000    Ruptured Disc  . Total knee arthroplasty Left 04/1999  . Total knee arthroplasty Right 02/2000  . Carpal tunnel release  2002  . Appendectomy  1940  . Partial hysterectomy    . Tonsillectomy  1934  . Breast biopsy      cysts in both breasts  . Ivc filter      Current Medications: Outpatient Prescriptions Prior to Visit  Medication Sig Dispense Refill  . amiodarone (PACERONE) 200 MG tablet Take 100 mg by mouth daily.    Marland Kitchen amoxicillin-clavulanate (AUGMENTIN) 875-125 MG tablet Take 1 tablet by mouth every 12 (twelve) hours. ABT Start Date 03/30/15 & End Date 03/30/15.    . Calcium Carb-Cholecalciferol (CALCIUM 600 + D)  600-200 MG-UNIT TABS Take 1 tablet by mouth 3 (three) times daily.    Marland Kitchen COUMADIN 2.5 MG tablet TAKE AS DIRECTED. 35 tablet 3  . HYDROcodone-acetaminophen (NORCO/VICODIN) 5-325 MG tablet Take 1 tablet by mouth every 6 (six) hours as needed for moderate pain. 15 tablet 0  . levothyroxine (SYNTHROID, LEVOTHROID) 150 MCG tablet Take 150 mcg by mouth daily before breakfast.    . Misc Natural Products (GLUCOSAMINE CHONDROITIN COMPLX) TABS Take 1 tablet by mouth 2 (two) times daily.     . Multiple Vitamins-Minerals (OCUVITE ADULT 50+ PO) Take 1 tablet by mouth daily.     No facility-administered medications prior to visit.     Allergies:   Cefdinir; Cortisone; Prednisone; Sulfasalazine; and Sulfonamide derivatives   Social History   Social History  . Marital Status: Widowed    Spouse Name: N/A  . Number of Children: 4  . Years of Education: College   Occupational History  .      retired   Social History Main Topics  . Smoking status: Never Smoker   . Smokeless tobacco: Never Used  . Alcohol Use: No  . Drug Use: No  . Sexual Activity: No   Other Topics Concern  . None   Social History Narrative   Patient lives at home alone.  Use walker to get around  Caffeine Use: rarely   Patient is right handed     Family History:  The patient's family history includes Breast cancer in her sister; Congestive Heart Failure in her mother; Stroke in her father. There is no history of Crohn's disease or Ulcerative colitis.   ROS:   Please see the history of present illness.    Occasional joint swelling, easy bruising, some vision disturbance, occasional "shooting" left upper chest discomfort.  All other systems reviewed and are negative.   PHYSICAL EXAM:   VS:  BP 112/60 mmHg  Pulse 58  Ht 5\' 6"  (1.676 m)  Wt 136 lb 1.9 oz (61.744 kg)  BMI 21.98 kg/m2   GEN: Well nourished, well developed, in no acute distress HEENT: normal Neck: no JVD, carotid bruits, or masses Cardiac: RRR; no  murmurs, rubs, or gallops,no edema  Respiratory:  clear to auscultation bilaterally, normal work of breathing GI: soft, nontender, nondistended, + BS MS: no deformity or atrophy Skin: warm and dry, no rash Neuro:  Alert and Oriented x 3, Strength and sensation are intact Psych: euthymic mood, full affect  Wt Readings from Last 3 Encounters:  07/31/15 136 lb 1.9 oz (61.744 kg)  03/25/15 140 lb (63.504 kg)  03/23/15 137 lb 9.6 oz (62.415 kg)      Studies/Labs Reviewed:   EKG:  EKG  Sinus bradycardia 58 bpm, ST-T wave abnormality, right bundle branch block (incomplete). No change compared to prior.  Recent Labs: 03/31/2015: BUN 16; Creatinine, Ser 1.12*; Hemoglobin 11.9*; Platelets 270; Potassium 4.1; Sodium 136   Lipid Panel No results found for: CHOL, TRIG, HDL, CHOLHDL, VLDL, LDLCALC, LDLDIRECT  Additional studies/ records that were reviewed today include:  Laboratory data from [facility] revealing normal thyroid and liver panel.    ASSESSMENT:    1. Paroxysmal atrial fibrillation (HCC)   2. On amiodarone therapy   3. Chronic diastolic heart failure ([place])   4. CKD (chronic kidney disease), stage IV (HCC)      PLAN:  In order of problems listed above:  1. No symptomatic recurrences of atrial fibrillation on rhythm control with amiodarone. 2. No laboratory evidence of liver or thyroid toxicity based upon laboratory data from [facility]) 3. No volume overload based upon clinical exam    Medication Adjustments/Labs and Tests Ordered: Current medicines are reviewed at length with the patient today.  Concerns regarding medicines are outlined above.  Medication changes, Labs and Tests ordered today are listed in the Patient Instructions below. Patient Instructions  Medication Instructions:  Your physician recommends that you continue on your current medications as directed. Please refer to the Current Medication list given to you today.   Labwork: None  ordered   Testing/Procedures: None ordered  Follow-Up: Your physician wants you to follow-up in:1 year with Dr.Nashika Coker You will receive a reminder letter in the mail two months in advance. If you don't receive a letter, please call our office to schedule the follow-up appointment.   Any Other Special Instructions Will Be Listed Below (If Applicable).     If you need a refill on your cardiac medications before your next appointment, please call your pharmacy.       Signed, Sinclair Grooms, MD  07/31/2015 9:16 AM    [facility] [address], [place], [place]  [address] Phone: [phone]; Fax: [phone]

## 2015-07-31 ENCOUNTER — Encounter: Payer: Self-pay | Admitting: Interventional Cardiology

## 2015-07-31 ENCOUNTER — Ambulatory Visit (INDEPENDENT_AMBULATORY_CARE_PROVIDER_SITE_OTHER): Payer: Medicare Other

## 2015-07-31 ENCOUNTER — Ambulatory Visit (INDEPENDENT_AMBULATORY_CARE_PROVIDER_SITE_OTHER): Payer: Medicare Other | Admitting: Interventional Cardiology

## 2015-07-31 VITALS — BP 112/60 | HR 58 | Ht 66.0 in | Wt 136.1 lb

## 2015-07-31 DIAGNOSIS — I4891 Unspecified atrial fibrillation: Secondary | ICD-10-CM

## 2015-07-31 DIAGNOSIS — I5032 Chronic diastolic (congestive) heart failure: Secondary | ICD-10-CM

## 2015-07-31 DIAGNOSIS — Z79899 Other long term (current) drug therapy: Secondary | ICD-10-CM | POA: Diagnosis not present

## 2015-07-31 DIAGNOSIS — I480 Paroxysmal atrial fibrillation: Secondary | ICD-10-CM | POA: Diagnosis not present

## 2015-07-31 DIAGNOSIS — Z5181 Encounter for therapeutic drug level monitoring: Secondary | ICD-10-CM

## 2015-07-31 DIAGNOSIS — N184 Chronic kidney disease, stage 4 (severe): Secondary | ICD-10-CM

## 2015-07-31 LAB — POCT INR: INR: 3.2

## 2015-07-31 NOTE — Patient Instructions (Signed)

## 2015-08-14 DIAGNOSIS — G5601 Carpal tunnel syndrome, right upper limb: Secondary | ICD-10-CM | POA: Diagnosis not present

## 2015-08-14 DIAGNOSIS — M0609 Rheumatoid arthritis without rheumatoid factor, multiple sites: Secondary | ICD-10-CM | POA: Diagnosis not present

## 2015-08-14 DIAGNOSIS — M4806 Spinal stenosis, lumbar region: Secondary | ICD-10-CM | POA: Diagnosis not present

## 2015-08-14 DIAGNOSIS — M150 Primary generalized (osteo)arthritis: Secondary | ICD-10-CM | POA: Diagnosis not present

## 2015-08-16 ENCOUNTER — Telehealth: Payer: Self-pay | Admitting: *Deleted

## 2015-08-16 NOTE — Telephone Encounter (Signed)
Pt's dtr called to inform us that the pt was started on Prednisone 5mg  daily on 08/15/15 & she may be on it for 2 months.  Advised that since the med is new to her (although low dose) & she hasn't taken a prednisone we should evaluate her INR on Friday. She verbalized & appt set over the phone.

## 2015-08-18 ENCOUNTER — Ambulatory Visit (INDEPENDENT_AMBULATORY_CARE_PROVIDER_SITE_OTHER): Payer: Medicare Other

## 2015-08-18 ENCOUNTER — Encounter (INDEPENDENT_AMBULATORY_CARE_PROVIDER_SITE_OTHER): Payer: Self-pay

## 2015-08-18 DIAGNOSIS — I4891 Unspecified atrial fibrillation: Secondary | ICD-10-CM

## 2015-08-18 DIAGNOSIS — Z5181 Encounter for therapeutic drug level monitoring: Secondary | ICD-10-CM | POA: Diagnosis not present

## 2015-08-18 LAB — POCT INR: INR: 2.5

## 2015-08-31 ENCOUNTER — Ambulatory Visit (INDEPENDENT_AMBULATORY_CARE_PROVIDER_SITE_OTHER): Payer: Medicare Other

## 2015-08-31 DIAGNOSIS — I4891 Unspecified atrial fibrillation: Secondary | ICD-10-CM

## 2015-08-31 DIAGNOSIS — Z5181 Encounter for therapeutic drug level monitoring: Secondary | ICD-10-CM

## 2015-08-31 LAB — POCT INR: INR: 2.3

## 2015-09-11 DIAGNOSIS — M25552 Pain in left hip: Secondary | ICD-10-CM | POA: Diagnosis not present

## 2015-09-12 DIAGNOSIS — M25552 Pain in left hip: Secondary | ICD-10-CM | POA: Diagnosis not present

## 2015-09-19 ENCOUNTER — Ambulatory Visit (INDEPENDENT_AMBULATORY_CARE_PROVIDER_SITE_OTHER): Payer: Medicare Other | Admitting: *Deleted

## 2015-09-19 ENCOUNTER — Telehealth: Payer: Self-pay | Admitting: *Deleted

## 2015-09-19 DIAGNOSIS — Z5181 Encounter for therapeutic drug level monitoring: Secondary | ICD-10-CM | POA: Diagnosis not present

## 2015-09-19 DIAGNOSIS — I4891 Unspecified atrial fibrillation: Secondary | ICD-10-CM

## 2015-09-19 LAB — POCT INR: INR: 3

## 2015-09-19 NOTE — Telephone Encounter (Signed)
Spoke with pt's daughter and she states that pt was started on Prednisone dose pack take prednisone 10 mg tabs 4  for 2 days then tabs 3 for 2 days then 2 tabs for 2 days and 1 tab for 2 days for Inflammation in her hips . She started the Prednisone on Saturday so appt made for her to have INR checked today

## 2015-09-21 ENCOUNTER — Ambulatory Visit: Payer: Medicare Other | Admitting: Neurology

## 2015-09-21 DIAGNOSIS — M25552 Pain in left hip: Secondary | ICD-10-CM | POA: Diagnosis not present

## 2015-09-27 ENCOUNTER — Ambulatory Visit: Payer: Medicare Other | Admitting: Neurology

## 2015-09-28 ENCOUNTER — Ambulatory Visit: Payer: Medicare Other | Admitting: Podiatry

## 2015-09-28 ENCOUNTER — Other Ambulatory Visit: Payer: Self-pay | Admitting: Interventional Cardiology

## 2015-10-03 DIAGNOSIS — N183 Chronic kidney disease, stage 3 (moderate): Secondary | ICD-10-CM | POA: Diagnosis not present

## 2015-10-03 DIAGNOSIS — I509 Heart failure, unspecified: Secondary | ICD-10-CM | POA: Diagnosis not present

## 2015-10-03 DIAGNOSIS — I4891 Unspecified atrial fibrillation: Secondary | ICD-10-CM | POA: Diagnosis not present

## 2015-10-03 DIAGNOSIS — D649 Anemia, unspecified: Secondary | ICD-10-CM | POA: Diagnosis not present

## 2015-10-03 DIAGNOSIS — E039 Hypothyroidism, unspecified: Secondary | ICD-10-CM | POA: Diagnosis not present

## 2015-10-03 DIAGNOSIS — M069 Rheumatoid arthritis, unspecified: Secondary | ICD-10-CM | POA: Diagnosis not present

## 2015-10-03 DIAGNOSIS — Z8719 Personal history of other diseases of the digestive system: Secondary | ICD-10-CM | POA: Diagnosis not present

## 2015-10-03 DIAGNOSIS — K529 Noninfective gastroenteritis and colitis, unspecified: Secondary | ICD-10-CM | POA: Diagnosis not present

## 2015-10-03 DIAGNOSIS — Z23 Encounter for immunization: Secondary | ICD-10-CM | POA: Diagnosis not present

## 2015-10-03 DIAGNOSIS — E782 Mixed hyperlipidemia: Secondary | ICD-10-CM | POA: Diagnosis not present

## 2015-10-04 ENCOUNTER — Ambulatory Visit (INDEPENDENT_AMBULATORY_CARE_PROVIDER_SITE_OTHER): Payer: Medicare Other | Admitting: Podiatry

## 2015-10-04 ENCOUNTER — Encounter: Payer: Self-pay | Admitting: Podiatry

## 2015-10-04 VITALS — BP 121/61 | HR 64 | Resp 14

## 2015-10-04 DIAGNOSIS — B351 Tinea unguium: Secondary | ICD-10-CM | POA: Diagnosis not present

## 2015-10-04 DIAGNOSIS — M79676 Pain in unspecified toe(s): Secondary | ICD-10-CM

## 2015-10-04 NOTE — Progress Notes (Signed)
Patient ID: [name], female   DOB: November 17, 1920, 82 y.o.   MRN: [id number] Complaint:  Visit Type: Patient returns to my office for continued preventative foot care services. Complaint: Patient states" my nails have grown long and thick and become painful to walk and wear shoes" . The patient presents for preventative foot care services. No changes to ROS  Podiatric Exam: Vascular: dorsalis pedis and posterior tibial pulses are palpable bilateral. Capillary return is immediate. Temperature gradient is WNL. Skin turgor WNL  Sensorium: Diminished  Semmes Weinstein monofilament test. Normal tactile sensation bilaterally. Nail Exam: Pt has thick disfigured discolored nails with subungual debris noted bilateral entire nail hallux through fifth toenails Ulcer Exam: There is no evidence of ulcer or pre-ulcerative changes or infection. Orthopedic Exam: Muscle tone and strength are WNL. No limitations in general ROM. No crepitus or effusions noted. Foot type and digits show no abnormalities. Bony prominences are unremarkable. Skin: No Porokeratosis. No infection or ulcers  Diagnosis:  Onychomycosis, , Pain in right toe, pain in left toes  Treatment & Plan Procedures and Treatment: Consent by patient was obtained for treatment procedures. The patient understood the discussion of treatment and procedures well. All questions were answered thoroughly reviewed. Debridement of mycotic and hypertrophic toenails, 1 through 5 bilateral and clearing of subungual debris. No ulceration, no infection noted.  Return Visit-Office Procedure: Patient instructed to return to the office for a follow up visit 3 months for continued evaluation and treatment.   Gardiner Barefoot DPM

## 2015-10-16 DIAGNOSIS — G5601 Carpal tunnel syndrome, right upper limb: Secondary | ICD-10-CM | POA: Diagnosis not present

## 2015-10-16 DIAGNOSIS — M150 Primary generalized (osteo)arthritis: Secondary | ICD-10-CM | POA: Diagnosis not present

## 2015-10-16 DIAGNOSIS — M0609 Rheumatoid arthritis without rheumatoid factor, multiple sites: Secondary | ICD-10-CM | POA: Diagnosis not present

## 2015-10-17 ENCOUNTER — Ambulatory Visit (INDEPENDENT_AMBULATORY_CARE_PROVIDER_SITE_OTHER): Payer: Medicare Other

## 2015-10-17 DIAGNOSIS — Z5181 Encounter for therapeutic drug level monitoring: Secondary | ICD-10-CM | POA: Diagnosis not present

## 2015-10-17 DIAGNOSIS — I4891 Unspecified atrial fibrillation: Secondary | ICD-10-CM

## 2015-10-17 LAB — POCT INR: INR: 2.7

## 2015-10-24 ENCOUNTER — Ambulatory Visit (INDEPENDENT_AMBULATORY_CARE_PROVIDER_SITE_OTHER): Payer: Medicare Other | Admitting: Neurology

## 2015-10-24 ENCOUNTER — Encounter: Payer: Self-pay | Admitting: Neurology

## 2015-10-24 VITALS — BP 142/58 | HR 70 | Resp 16 | Ht 66.0 in | Wt 137.0 lb

## 2015-10-24 DIAGNOSIS — G250 Essential tremor: Secondary | ICD-10-CM | POA: Diagnosis not present

## 2015-10-24 DIAGNOSIS — H543 Unqualified visual loss, both eyes: Secondary | ICD-10-CM

## 2015-10-24 DIAGNOSIS — R2689 Other abnormalities of gait and mobility: Secondary | ICD-10-CM

## 2015-10-24 NOTE — Progress Notes (Signed)
Subjective:    Patient ID: [name] is a 82 y.o. female.  HPI     Interim history:  [name] is a very pleasant 82-year-old right-handed woman with a complex underlying history of atrial fibrillation, thyroid disease, giant cell arteritis, DVT with PE, hysterectomy, status post [place] filter placement, spinal stenosis with surgery in 2000, right knee replacement surgery in 2001, left knee replacement surgery in 2002, essential tremor, peripheral neuropathy, macular degeneration, and hearing loss, who presents for followup consultation of her gait disorder, likely secondary to peripheral neuropathy, advanced degenerative spine disease, blindness, hearing loss, vertigo, and essential tremor. She is accompanied by her oldest daughter today, who is visiting from [place]. I last saw her on 03/23/2015, at which time she reported more weakness in her lower extremities and lower back pain. She was trying to do exercises sitting down and was walking with her walker. She had cataract surgery on the right. She did have a recent fall and bumped her head. She had no loss of consciousness and no headache, she felt that her tremor was worse as well.    Today, 10/24/2015: She reports doing okay. In the interim, she went to the emergency room on 2 occasions. She went to the emergency room on 03/25/2015 secondary to abdominal pain and was found to have partial small bowel obstruction, which was managed as an outpatient. She went to the emergency room on 03/31/2015 for right hand swelling and had an x-ray. I reviewed the x-ray of the right hand, 3 views, from 03/31/2015: IMPRESSION: No evidence of fracture or dislocation. Mild degenerative change at the first carpometacarpal joint, and at the ulnar aspect of the carpal rows.   She was diagnosed with rheumatoid arthritis. She has seen her orthopedic doctor and was referred to rheumatology. She was treated on 2 occasions with a steroid taper and is now on  prednisone 5 mg daily per rheumatologist. She developed severe left hip pain and this with time has improved as well. This was due to flareup of osteoarthritis. She had a near fall recently but thankfully caught herself. Her tremor may be a little bit worse.  Previously:    I saw her on 09/22/2014, at which time she reported that her headaches on the R side had resolved. She saw another dentist and was told to use a hollowed out pillow and was given a mouth piece for TMJ and this had helped. She had intermittent right arm pain and weakness and numbness and grip weakness. She had neck pain and saw her orthopedic doctor, had PT and felt better. I suggested she continue using her walker and drink water. I suggested gentle strengthening and range of motion exercises for her upper body.   I saw her on 04/05/2014, at which time she reported a one month Hx of intermittent dull headache and some sharp shooting pains on the right side of her head. Symptoms were different from when she was diagnosed in the past with giant cell arteritis. Nevertheless, she saw her primary care physician who ordered a head CT. She had a head CT on 03/18/2014: Mild patchy periventricular small vessel disease. No intracranial mass, hemorrhage, or acute appearing infarct. In addition, I personally reviewed the images through the PACS system. She also reported having had a sedimentation rate and CRP checked both of which were negative for concern for temporal arteritis. She saw her ophthalmologist who did not see any new problems explaining the situation. She had been diagnosed with wet macular   degeneration and had injections into both eyes for treatment of this. Symptoms coincidentally started after she had her eye injection, about one week after she had her right I injected. She had her left eye injected a week after the right. She had no new one-sided neurological symptoms such as weakness, numbness, tingling, no facial droop, she did have  some joint pain in her TMJ on the right. She saw her dentist. She said he was not concerned. She was advised not to chew on hard foods and try to eat softer foods. I suggested we proceed with a brain MRI contrast, which she had on 04/26/14: Mildly abnormal MRI brain (without) demonstrating: 1. Scattered periventricular and subcortical foci of non-specific gliosis, likely chronic small vessel ischemic disease. 2. No acute findings. In addition, I personally reviewed the images through the PACS system. We called her with her test results.   I saw her on 09/21/2013, at which time she reported having had a UTI. She had stayed with her daughter. She had not fallen. Nocturia became worse. She was complaining of leg pain. She presents for a sooner than scheduled appointment secondary to new onset jaw pain.   I saw her on 03/18/2013, at which time her daughter reported that the patient was hospitalized for about 10 days in January 2015 for small bowel obstruction and symptoms consisting of abdominal pain, nausea and vomiting. Her hospitalization was complicated by aspiration pneumonia, A. fib, pulmonary embolus, nausea, vomiting, microscopic colitis, leukocytosis, CKD (chronic kidney disease), stage IV. She had home health therapies and has a emergency call button. She continues to live alone. She has no caregiver at home on a daily basis, but her kids check with her frequently. She has help with cleaning. She cooks at the stove and occasionally uses a knife. Her INR was stable. I talked to her and her daughter at length last time and I was particularly concerned about her continuing to live alone and working in the kitchen and at the stove because she is visually impaired and has had balance problems, gait disorder, abnormal posture and I advised her to have someone at the house for help on a day-to-day basis. She was advised to use her walker at all times. I did not suggest any new medications.   I first met her  on 09/08/2012, at which time he felt she had remained fairly stable and I did not suggest any medication changes. My main concern was that she continued to live alone and I talked to her and her daughter about that. Because of her visual and hearing impairment, balance problems, and gait dysfunction and abnormal posture as well as her age I felt that she needed to start a discussion with her family about an alternative living situation. I advised her to use her walker at all times.   She previously followed with Dr. Morene Antu and was last seen by Dr. Erling Cruz on 03/12/2012, at which time he felt that she was stable and that she was doing well living by herself. She no longer drives. She sees Dr. Hardin Negus for pain management. She has had bladder incontinence. She has a long-standing history of peripheral neuropathy. She has a 2 story home. 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 One daughter is in [place], one in [place] and [name] is local and her son lives at [place]. She has a life alert button.    Her Past Medical History Is Significant For: Past  Medical History:  Diagnosis Date  . Blindness   . Blood clot associated with vein wall inflammation   . CKD (chronic kidney disease), stage IV ([place])   . Colitis   . Goiter    s/p radioactive iod. tx  . Hypothyroidism   . PAF (paroxysmal atrial fibrillation) ([place])   . Paroxysmal atrial fibrillation ([facility])   . Pulmonary embolus ([facility])    and DVT s/p IVC filter  . Renal insufficiency   . SBO (small bowel obstruction)   . Spinal stenosis of lumbar region with radiculopathy    possible neurogenic claudication recently?  . Temporal arteritis ([place])     Her Past Surgical History Is Significant For: Past Surgical History:  Procedure Laterality Date  . APPENDECTOMY  1940  . BACK SURGERY  04/1998   spinal stenosis  . BACK SURGERY  2000   Ruptured Disc  . BREAST BIOPSY     cysts in both breasts  . CARPAL TUNNEL RELEASE   2002  . ivc filter    . PARTIAL HYSTERECTOMY    . TONSILLECTOMY  1934  . TOTAL KNEE ARTHROPLASTY Left 04/1999  . TOTAL KNEE ARTHROPLASTY Right 02/2000    Her Family History Is Significant For: Family History  Problem Relation Age of Onset  . Breast cancer Sister   . Congestive Heart Failure Mother   . Stroke Father   . Crohn's disease Neg Hx   . Ulcerative colitis Neg Hx     Her Social History Is Significant For: Social History   Social History  . Marital status: Widowed    Spouse name: N/A  . Number of children: 4  . Years of education: College   Occupational History  .  Retired    retired   Social History Main Topics  . Smoking status: Never Smoker  . Smokeless tobacco: Never Used  . Alcohol use No  . Drug use: No  . Sexual activity: No   Other Topics Concern  . None   Social History Narrative   Patient lives at home alone.  Use walker to get around   Caffeine Use: rarely   Patient is right handed    Her Allergies Are:  Allergies  Allergen Reactions  . Cefdinir     Caused bad diarrhea  . Cortisone     Flushed hands and face   . Prednisone     cuased blood clots  . Sulfasalazine     Nausea side effects  . Sulfonamide Derivatives     Stomach pains and cramping   :   Her Current Medications Are:  Outpatient Encounter Prescriptions as of 10/24/2015  Medication Sig  . amiodarone (PACERONE) 200 MG tablet Take 0.5 tablets (100 mg total) by mouth daily.  . Calcium Carb-Cholecalciferol (CALCIUM 600 + D) 600-200 MG-UNIT TABS Take 1 tablet by mouth 3 (three) times daily.  Marland Kitchen COUMADIN 2.5 MG tablet TAKE AS DIRECTED.  Marland Kitchen levothyroxine (SYNTHROID, LEVOTHROID) 150 MCG tablet Take 150 mcg by mouth daily before breakfast.  . Misc Natural Products (GLUCOSAMINE CHONDROITIN COMPLX) TABS Take 1 tablet by mouth 2 (two) times daily.   . Multiple Vitamins-Minerals (OCUVITE ADULT 50+ PO) Take 1 tablet by mouth daily.  . prednisoLONE 5 MG TABS tablet Take by mouth daily.   . [DISCONTINUED] amoxicillin-clavulanate (AUGMENTIN) 875-125 MG tablet Take 1 tablet by mouth every 12 (twelve) hours. ABT Start Date 03/30/15 & End Date 03/30/15.  . [DISCONTINUED] HYDROcodone-acetaminophen (NORCO/VICODIN) 5-325 MG tablet Take 1 tablet by mouth every  6 (six) hours as needed for moderate pain.   No facility-administered encounter medications on file as of 10/24/2015.   :  Review of Systems:  Out of a complete 14 point review of systems, all are reviewed and negative with the exception of these symptoms as listed below: Review of Systems  Neurological:       Patient is here for f/u. No new concerns.     Objective:  Neurologic Exam  Physical Exam Physical Examination:   Vitals:   10/24/15 0931  BP: (!) 142/58  Pulse: 70  Resp: 16   General Examination: The patient is a very pleasant 82 y.o. female in no acute distress. She appears well-developed and well-nourished and very well groomed. She is in good spirits today, as is her usual.  HEENT: Normocephalic, atraumatic, pupils are equal, round and reactive to light and accommodation. She is visually impaired bilaterally. She is status post right cataract repair and has a cataract on the left. Extraocular tracking is fair without nystagmus noted. Normal smooth pursuit is noted. Hearing is impaired. Face is symmetric with normal facial animation and normal facial sensation. Speech is clear with no dysarthria noted. There is no hypophonia. There is an unchanged lower lip and jaw tremor, and a no-no type neck/head, and voice tremor, worse. Neck is supple with decrease in range of motion. There are no carotid bruits on auscultation. Oropharynx exam reveals: mild mouth dryness, adequate dental hygiene and no significant airway crowding.  Chest: Clear to auscultation without wheezing, rhonchi or crackles noted.  Heart: S1+S2+0, regular and normal without murmurs, rubs or gallops noted.   Abdomen: Soft, non-tender and  non-distended with normal bowel sounds appreciated on auscultation.  Extremities: There is no pitting edema in the distal lower extremities bilaterally. Pedal pulses are intact. she has unremarkable scars from bilateral knee replacement surgeries.  Skin: Warm and dry without trophic changes noted. There are no varicose veins.  Musculoskeletal: exam reveals no obvious joint deformities, tenderness or joint swelling or erythema, except for arthritic changes in both hands, right more than left. She has scoliosis. She wears a brace. She has an abnormal neck position and on equal shoulder height with left higher than right.   Neurologically:  Mental status: The patient is awake, alert and oriented in all 4 spheres. Her memory, attention, language and knowledge are appropriate. There is no aphasia, agnosia, apraxia or anomia. Speech is clear with normal prosody and enunciation. Thought process is linear. Mood is congruent and affect is normal.  Cranial nerves are as described above under HEENT exam. Motor exam: Normal bulk, strength for age and tone is noted, with the exception of mild grip weakness bilaterally, right more so than left. There is no drift, rest tremor or rebound. Romberg is not testable safely. Has b/l UE posture and action tremor. Reflexes are 1+ throughout, absent in the knees and ankles, stable. Fine motor skills are fairly well preserved.  Cerebellar testing shows no dysmetria or intention tremor on finger to nose testing. There is no truncal or gait ataxia. HTS good.  Sensory exam is intact to light touch, pinprick, vibration, temperature sense in the upper extremities and decreased to all modalities in the LEs, unchanged, up to the knees, left a little better than right.    Gait, station and balance: she stands with difficulty and pushes herself up. She leans to the R. Posture is mild to moderately stooped for age. She has scoliosis, all stable. Stance is slightly wide-based. She cannot    do tandem walk. She wears a soft back brace around the lower back. She uses her rolling walker fairly well, turns better today.   Assessment and Plan:   In summary, Epsie P Cullop is a very pleasant 82-year old female with a history of peripheral neuropathy, chronic degenerative spine disease, blindness, hearing loss, vertigo, and essential tremor and a gait disorder who presents for follow-up consultation of her gait disorder and essential tremor. She reported right-sided headaches which have since then resolved especially after she was given a mouth guard and was told to use a special pillow to help with her TMJ issues. Her neck pain improved as well. She is using a very thin pillow to sleep on. She has recently been diagnosed with rheumatoid arthritis and also had flareup of her osteoarthritis. Overall, she is doing quite well given her circumstances and her age. We did blood work in the recent past, which were fine. Unfortunately, there is not a whole lot I can offer at this time. We have previously talked about the symptomatic treatment options for tremors but given her age, her other medications, her fall risk, her visual impairment, and her gait disorder, I did not think she would benefit from a medication trial and may be at risk for side effects. She continues to stay fairly active. She uses her walker at all times. She has a good support system. She had a brain MRI without contrast which did not show any acute findings. Her gait disorder is likely secondary to spinal stenosis, peripheral neuropathy, tremors, visual impairment, hearing impairment, aging, abnormal posture and ET. I encouraged her to continue with strengthening and range of motion exercises for her upper and lower body, she is advised to stay well hydrated with water and eat healthy and balanced diet. I suggested a 6 month follow-up, sooner as needed. I answered all their questions today and the patient and her daughter were in  agreement. I spent 25 minutes in total face-to-face time with the patient, more than 50% of which was spent in counseling and coordination of care, reviewing test results, reviewing medication and discussing or reviewing the diagnosis of  gait disorder and tremors, the prognosis and treatment options.  

## 2015-10-24 NOTE — Patient Instructions (Addendum)
Please continue to be mindful of your balance issues. Use your walker at all times.  Please stay well hydrated and eat well.  Your exam is fairly stable.

## 2015-11-15 ENCOUNTER — Ambulatory Visit (INDEPENDENT_AMBULATORY_CARE_PROVIDER_SITE_OTHER): Payer: Medicare Other | Admitting: *Deleted

## 2015-11-15 DIAGNOSIS — Z5181 Encounter for therapeutic drug level monitoring: Secondary | ICD-10-CM

## 2015-11-15 DIAGNOSIS — I4891 Unspecified atrial fibrillation: Secondary | ICD-10-CM

## 2015-11-15 LAB — POCT INR: INR: 2.8

## 2015-11-21 ENCOUNTER — Telehealth: Payer: Self-pay | Admitting: Interventional Cardiology

## 2015-11-21 NOTE — Telephone Encounter (Signed)
Spoke with daughter and she is concerned about pt increasing Amio at this time due to balance issues.  Offered to schedule pt to see APP on a day that Dr. [name] is in the office so that we can get her seen sooner.  Daughter agreeable with this plan.  Scheduled pt to see Melina Copa, PA-C on 11/30/15.  Daughter verbalized understanding and was very appreciative for assistance.

## 2015-11-21 NOTE — Telephone Encounter (Signed)
Daughter states a couple of weeks ago pt rolled over to left side during the night and her heart began racing.  Pt told daughter that it was going so fast she couldn't even count it.  Pt sat up in bed and heart rate slowly came back down.  This has not happened since and pt has not attempted to sleep on left side anymore.  Daughter states pt did not complain or any dizziness, lightheadedness or SOB when episode occurred.  Both pt and daughter are very concerned about this episode and pt being afraid to try to sleep on left side again.  Daughter would like to speak with Dr. [name] directly and have pt come in to see Dr. [name].  Scheduled appt and advised I will send message to Dr. [name].  Advised daughter he may send message back to me and I can call with his recommendations. Daughter verbalized understanding and was appreciative for assistance.

## 2015-11-21 NOTE — Telephone Encounter (Signed)
New message    Daughter calling couple of week ago a flare up on Afib during the night . Wants to discuss with Dr. [name].

## 2015-11-21 NOTE — Telephone Encounter (Signed)
Increase amiodarone to 200 mg per day for 3 weeks. After 3 weeks decrease back to 100 mg per day.

## 2015-11-28 ENCOUNTER — Encounter: Payer: Self-pay | Admitting: Physician Assistant

## 2015-11-28 DIAGNOSIS — H353222 Exudative age-related macular degeneration, left eye, with inactive choroidal neovascularization: Secondary | ICD-10-CM | POA: Diagnosis not present

## 2015-11-28 DIAGNOSIS — D649 Anemia, unspecified: Secondary | ICD-10-CM | POA: Insufficient documentation

## 2015-11-28 DIAGNOSIS — N183 Chronic kidney disease, stage 3 (moderate): Secondary | ICD-10-CM

## 2015-11-28 DIAGNOSIS — H353212 Exudative age-related macular degeneration, right eye, with inactive choroidal neovascularization: Secondary | ICD-10-CM | POA: Diagnosis not present

## 2015-11-28 DIAGNOSIS — N1831 Chronic kidney disease, stage 3a: Secondary | ICD-10-CM | POA: Insufficient documentation

## 2015-11-28 NOTE — Progress Notes (Signed)
Cardiology Office Note    Date:  11/30/2015  ID:  [name], DOB 1920-11-11, MRN [id number] PCP:  [name], MD  Cardiologist:  [name]   Chief Complaint: f/u afib  History of Present Illness:  [name] is a 82 y.o. female with history of paroxysmal atrial fib, chronic amiodarone treatment, history of PE and DVT, temporal arteritis, spinal stenosis, CKD stage III who presents for f/u of atrial fib. Recently called in with more palpitations. LFTs, TSH wnl 07/2015. BMET 03/2015 with Cr 1.12 (GFR 40s), K 4.1, Hgb 11.9. 2D Echo 01/2013: EF 55-60%, mild-mod LVH, mod LAE/RAE, mildly dilated RV.  She returns for f/u with her daughter to discuss palpitations. About 3 weeks ago she turned over in bed and noticed her heart racing. This lasted about 10 minutes, associated with a sensation of tremor in her chest. No chest pain, SOB. The palpitations resolved spontaneously. Her daughter called in - per phone note Dr. [name] advised she increase amiodarone to 200mg  daily for several weeks then drop back down to 100mg  daily. They were hesitant to do so as they feel in the past amiodarone has exacerbated her essential tremor. She has continued 100mg  daily. She has not noticed any recurrent palpitations. This is the first episode she has had in a long time. She does have issues with gait instability due to macular degeneration and peripheral neuropathy, but ambulates with a walker and has not had any falls. She's fairly sharp for her age.   Past Medical History:  Diagnosis Date  . Blindness   . Blood clot associated with vein wall inflammation   . CKD (chronic kidney disease), stage III   . Colitis   . Goiter    s/p radioactive iod. tx  . Hypothyroidism   . On amiodarone therapy   . Paroxysmal atrial fibrillation (HCC)   . Pulmonary embolus (HCC)    and DVT s/p IVC filter  . SBO (small bowel obstruction)   . Spinal stenosis of lumbar region with radiculopathy    possible neurogenic  claudication recently?  . Temporal arteritis [facility])     Past Surgical History:  Procedure Laterality Date  . APPENDECTOMY  1940  . BACK SURGERY  04/1998   spinal stenosis  . BACK SURGERY  2000   Ruptured Disc  . BREAST BIOPSY     cysts in both breasts  . CARPAL TUNNEL RELEASE  2002  . ivc filter    . PARTIAL HYSTERECTOMY    . TONSILLECTOMY  1934  . TOTAL KNEE ARTHROPLASTY Left 04/1999  . TOTAL KNEE ARTHROPLASTY Right 02/2000    Current Medications: Current Outpatient Prescriptions  Medication Sig Dispense Refill  . amiodarone (PACERONE) 200 MG tablet Take 0.5 tablets (100 mg total) by mouth daily. 45 tablet 2  . Calcium Carb-Cholecalciferol (CALCIUM 600 + D) 600-200 MG-UNIT TABS Take 1 tablet by mouth 3 (three) times daily.    Marland Kitchen COUMADIN 2.5 MG tablet TAKE AS DIRECTED. 35 tablet 3  . levothyroxine (SYNTHROID, LEVOTHROID) 150 MCG tablet Take 150 mcg by mouth daily before breakfast.    . Misc Natural Products (GLUCOSAMINE CHONDROITIN COMPLX) TABS Take 1 tablet by mouth 2 (two) times daily.     . Multiple Vitamins-Minerals (OCUVITE ADULT 50+ PO) Take 1 tablet by mouth daily.    . prednisoLONE 5 MG TABS tablet Take by mouth daily.     No current facility-administered medications for this visit.      Allergies:   Cefdinir; Cortisone;  Prednisone; Sulfasalazine; and Sulfonamide derivatives   Social History   Social History  . Marital status: Widowed    Spouse name: N/A  . Number of children: 4  . Years of education: College   Occupational History  .  Retired    retired   Social History Main Topics  . Smoking status: Never Smoker  . Smokeless tobacco: Never Used  . Alcohol use No  . Drug use: No  . Sexual activity: No   Other Topics Concern  . None   Social History Narrative   Patient lives at home alone.  Use walker to get around   Caffeine Use: rarely   Patient is right handed     Family History:  The patient's family history includes Breast cancer in her  sister; Congestive Heart Failure in her mother; Stroke in her father.   ROS:   Please see the history of present illness. All other systems are reviewed and otherwise negative.    PHYSICAL EXAM:   VS:  BP (!) 110/58   Pulse 60   Ht 5\' 6"  (1.676 m)   Wt 137 lb (62.1 kg)   BMI 22.11 kg/m   BMI: Body mass index is 22.11 kg/m. GEN: Well nourished, well developed WF in no acute distress  HEENT: normocephalic, atraumatic Neck: no JVD, carotid bruits, or masses Cardiac: RRR; no murmurs, rubs, or gallops, no edema  Respiratory:  clear to auscultation bilaterally, normal work of breathing GI: soft, nontender, nondistended, + BS MS: no deformity or atrophy  Skin: warm and dry, no rash Neuro:  Alert and Oriented x 3, Strength and sensation are intact, follows commands. Essential tremor noted Psych: euthymic mood, full affect  Wt Readings from Last 3 Encounters:  11/30/15 137 lb (62.1 kg)  10/24/15 137 lb (62.1 kg)  07/31/15 136 lb 1.9 oz (61.7 kg)      Studies/Labs Reviewed:   EKG:  EKG was ordered today and personally reviewed by me and demonstrates NSR 60bpm with PACs, incomplete RBBB, septal infarct age undetermined, nonspecific TW changes  Recent Labs: 03/31/2015: BUN 16; Creatinine, Ser 1.12; Hemoglobin 11.9; Platelets 270; Potassium 4.1; Sodium 136   Lipid Panel No results found for: CHOL, TRIG, HDL, CHOLHDL, VLDL, LDLCALC, LDLDIRECT  Additional studies/ records that were reviewed today include: Summarized above.    ASSESSMENT & PLAN:   1. Paroxysmal atrial fib - had short-lived episode of palpitations about 3 weeks ago, have not recurred since that time. Given her tremor and tendency for low-normal BP, I favor continuing amiodarone at present dose of 100mg  daily and observing for more symptoms. If her palpitations recur, would revisit the idea of a short-term increase. Will check screening labs to make sure there has been no metabolic change to prompt the recurrence. We  also discussed the importance of continuing to work on gait stability. I asked them to please notify our office immediately if she has any falls, at which time we would need to revisit candidacy for anticoagulation. Her INR is followed in our Coumadin clinic. 2. CKD stage III - recheck labs today. Will also send labs to her PCP and rheumatologist Dr. Lenna Gilford per her request when they result. 3. Mild anemia - recheck labs today. 4. H/o amiodarone therapy - as above. Further monitoring of organ systems while on amiodarone will be at discretion of primary cardiologist. LFTs in June were normal.  Disposition: F/u with Dr. [name] in 12/2015 as scheduled (they wish to keep this appointment).   Medication  Adjustments/Labs and Tests Ordered: Current medicines are reviewed at length with the patient today.  Concerns regarding medicines are outlined above. Medication changes, Labs and Tests ordered today are summarized above and listed in the Patient Instructions accessible in Encounters.   Raechel Ache PA-C  11/30/2015 10:21 AM    [address], [place], [place]  [address] Phone: [phone]; Fax: [phone]

## 2015-11-30 ENCOUNTER — Ambulatory Visit (INDEPENDENT_AMBULATORY_CARE_PROVIDER_SITE_OTHER): Payer: Medicare Other | Admitting: Physician Assistant

## 2015-11-30 ENCOUNTER — Encounter: Payer: Self-pay | Admitting: Physician Assistant

## 2015-11-30 ENCOUNTER — Encounter (INDEPENDENT_AMBULATORY_CARE_PROVIDER_SITE_OTHER): Payer: Self-pay

## 2015-11-30 VITALS — BP 110/58 | HR 60 | Ht 66.0 in | Wt 137.0 lb

## 2015-11-30 DIAGNOSIS — Z9229 Personal history of other drug therapy: Secondary | ICD-10-CM | POA: Diagnosis not present

## 2015-11-30 DIAGNOSIS — I480 Paroxysmal atrial fibrillation: Secondary | ICD-10-CM | POA: Diagnosis not present

## 2015-11-30 DIAGNOSIS — D649 Anemia, unspecified: Secondary | ICD-10-CM

## 2015-11-30 DIAGNOSIS — N183 Chronic kidney disease, stage 3 unspecified: Secondary | ICD-10-CM

## 2015-11-30 LAB — CBC
HCT: 38.9 % (ref 35.0–45.0)
HEMOGLOBIN: 12.8 g/dL (ref 11.7–15.5)
MCH: 30.3 pg (ref 27.0–33.0)
MCHC: 32.9 g/dL (ref 32.0–36.0)
MCV: 92.2 fL (ref 80.0–100.0)
MPV: 10.1 fL (ref 7.5–12.5)
PLATELETS: 247 10*3/uL (ref 140–400)
RBC: 4.22 MIL/uL (ref 3.80–5.10)
RDW: 14.9 % (ref 11.0–15.0)
WBC: 11.3 10*3/uL — ABNORMAL HIGH (ref 3.8–10.8)

## 2015-11-30 LAB — BASIC METABOLIC PANEL
BUN: 36 mg/dL — ABNORMAL HIGH (ref 7–25)
CALCIUM: 9.6 mg/dL (ref 8.6–10.4)
CHLORIDE: 102 mmol/L (ref 98–110)
CO2: 29 mmol/L (ref 20–31)
Creat: 1.54 mg/dL — ABNORMAL HIGH (ref 0.60–0.88)
GLUCOSE: 64 mg/dL — AB (ref 65–99)
Potassium: 4.4 mmol/L (ref 3.5–5.3)
SODIUM: 138 mmol/L (ref 135–146)

## 2015-11-30 LAB — MAGNESIUM: Magnesium: 2.3 mg/dL (ref 1.5–2.5)

## 2015-11-30 LAB — TSH: TSH: 4.15 m[IU]/L

## 2015-11-30 NOTE — Patient Instructions (Addendum)
Medication Instructions:  Your physician recommends that you continue on your current medications as directed. Please refer to the Current Medication list given to you today.   Labwork: TODAY:  BMET, MAG, CBC, & TSH  Testing/Procedures: None ordered  Follow-Up: Your physician recommends that you schedule a follow-up appointment in: 12/22/15 WITH DR. [name]   Any Other Special Instructions Will Be Listed Below (If Applicable).     If you need a refill on your cardiac medications before your next appointment, please call your pharmacy.

## 2015-12-05 DIAGNOSIS — H547 Unspecified visual loss: Secondary | ICD-10-CM | POA: Diagnosis not present

## 2015-12-05 DIAGNOSIS — D72829 Elevated white blood cell count, unspecified: Secondary | ICD-10-CM | POA: Diagnosis not present

## 2015-12-05 DIAGNOSIS — I4891 Unspecified atrial fibrillation: Secondary | ICD-10-CM | POA: Diagnosis not present

## 2015-12-05 DIAGNOSIS — N183 Chronic kidney disease, stage 3 (moderate): Secondary | ICD-10-CM | POA: Diagnosis not present

## 2015-12-13 ENCOUNTER — Ambulatory Visit (INDEPENDENT_AMBULATORY_CARE_PROVIDER_SITE_OTHER): Payer: Medicare Other | Admitting: *Deleted

## 2015-12-13 ENCOUNTER — Ambulatory Visit (INDEPENDENT_AMBULATORY_CARE_PROVIDER_SITE_OTHER): Payer: Medicare Other | Admitting: Podiatry

## 2015-12-13 ENCOUNTER — Encounter: Payer: Self-pay | Admitting: Podiatry

## 2015-12-13 VITALS — Ht 66.0 in | Wt 137.0 lb

## 2015-12-13 DIAGNOSIS — M79676 Pain in unspecified toe(s): Secondary | ICD-10-CM | POA: Diagnosis not present

## 2015-12-13 DIAGNOSIS — B351 Tinea unguium: Secondary | ICD-10-CM

## 2015-12-13 DIAGNOSIS — Z5181 Encounter for therapeutic drug level monitoring: Secondary | ICD-10-CM

## 2015-12-13 DIAGNOSIS — I4891 Unspecified atrial fibrillation: Secondary | ICD-10-CM

## 2015-12-13 LAB — POCT INR: INR: 2.7

## 2015-12-13 NOTE — Progress Notes (Signed)
Patient ID: [name], female   DOB: [birth year], 82 y.o.   MRN: [id number] Complaint:  Visit Type: Patient returns to my office for continued preventative foot care services. Complaint: Patient states" my nails have grown long and thick and become painful to walk and wear shoes" . The patient presents for preventative foot care services. No changes to ROS  Podiatric Exam: Vascular: dorsalis pedis and posterior tibial pulses are palpable bilateral. Capillary return is immediate. Temperature gradient is WNL. Skin turgor WNL  Sensorium: Diminished  Semmes Weinstein monofilament test. Normal tactile sensation bilaterally. Nail Exam: Pt has thick disfigured discolored nails with subungual debris noted bilateral entire nail hallux through fifth toenails Ulcer Exam: There is no evidence of ulcer or pre-ulcerative changes or infection. Orthopedic Exam: Muscle tone and strength are WNL. No limitations in general ROM. No crepitus or effusions noted. Foot type and digits show no abnormalities. Bony prominences are unremarkable. Skin: No Porokeratosis. No infection or ulcers  Diagnosis:  Onychomycosis, , Pain in right toe, pain in left toes  Treatment & Plan Procedures and Treatment: Consent by patient was obtained for treatment procedures. The patient understood the discussion of treatment and procedures well. All questions were answered thoroughly reviewed. Debridement of mycotic and hypertrophic toenails, 1 through 5 bilateral and clearing of subungual debris. No ulceration, no infection noted.  Return Visit-Office Procedure: Patient instructed to return to the office for a follow up visit 3 months for continued evaluation and treatment.   Gardiner Barefoot DPM

## 2015-12-18 ENCOUNTER — Other Ambulatory Visit: Payer: Self-pay | Admitting: Interventional Cardiology

## 2015-12-19 DIAGNOSIS — M069 Rheumatoid arthritis, unspecified: Secondary | ICD-10-CM | POA: Diagnosis not present

## 2015-12-19 DIAGNOSIS — M79602 Pain in left arm: Secondary | ICD-10-CM | POA: Diagnosis not present

## 2015-12-19 DIAGNOSIS — R0789 Other chest pain: Secondary | ICD-10-CM | POA: Diagnosis not present

## 2015-12-19 DIAGNOSIS — R208 Other disturbances of skin sensation: Secondary | ICD-10-CM | POA: Diagnosis not present

## 2015-12-21 DIAGNOSIS — Z8719 Personal history of other diseases of the digestive system: Secondary | ICD-10-CM | POA: Diagnosis not present

## 2015-12-21 DIAGNOSIS — R933 Abnormal findings on diagnostic imaging of other parts of digestive tract: Secondary | ICD-10-CM | POA: Diagnosis not present

## 2015-12-21 DIAGNOSIS — K529 Noninfective gastroenteritis and colitis, unspecified: Secondary | ICD-10-CM | POA: Diagnosis not present

## 2015-12-22 ENCOUNTER — Ambulatory Visit (INDEPENDENT_AMBULATORY_CARE_PROVIDER_SITE_OTHER): Payer: Medicare Other | Admitting: Interventional Cardiology

## 2015-12-22 ENCOUNTER — Encounter: Payer: Self-pay | Admitting: Interventional Cardiology

## 2015-12-22 ENCOUNTER — Encounter (INDEPENDENT_AMBULATORY_CARE_PROVIDER_SITE_OTHER): Payer: Self-pay

## 2015-12-22 ENCOUNTER — Ambulatory Visit: Payer: Medicare Other | Admitting: Interventional Cardiology

## 2015-12-22 VITALS — BP 148/64 | HR 73 | Ht 64.0 in | Wt 138.8 lb

## 2015-12-22 DIAGNOSIS — I5032 Chronic diastolic (congestive) heart failure: Secondary | ICD-10-CM | POA: Diagnosis not present

## 2015-12-22 DIAGNOSIS — N184 Chronic kidney disease, stage 4 (severe): Secondary | ICD-10-CM | POA: Diagnosis not present

## 2015-12-22 DIAGNOSIS — Z79899 Other long term (current) drug therapy: Secondary | ICD-10-CM

## 2015-12-22 DIAGNOSIS — I480 Paroxysmal atrial fibrillation: Secondary | ICD-10-CM | POA: Diagnosis not present

## 2015-12-22 NOTE — Progress Notes (Signed)
Cardiology Office Note    Date:  12/22/2015   ID:  Brittany, Hamilton 12/23/1920, MRN [id number]  PCP:  [name], MD  Cardiologist: Sinclair Grooms, MD   Chief Complaint  Patient presents with  . Atrial Fibrillation    History of Present Illness:  [name] is a 81 y.o. female paroxysmal atrial fibrillation, chronic amiodarone therapy, history of PE, chronic anticoagulation therapy, and chronic kidney disease  She is doing well. She does not want to be on the higher dose of amiodarone because she already has a tremor. She has not had difficulty since the event 3 weeks ago. She was seen by Melina Copa, PA-C after the event. Creatinine was back to a baseline of around 1.5. [name] became concerned because in comparison to the prior creatinine of 1.12 she felt that there was worsening of the kidney function.   Past Medical History:  Diagnosis Date  . Blindness   . Blood clot associated with vein wall inflammation   . CKD (chronic kidney disease), stage III   . Colitis   . Goiter    s/p radioactive iod. tx  . Hypothyroidism   . On amiodarone therapy   . Paroxysmal atrial fibrillation (HCC)   . Pulmonary embolus (HCC)    and DVT s/p IVC filter  . SBO (small bowel obstruction)   . Spinal stenosis of lumbar region with radiculopathy    possible neurogenic claudication recently?  . Temporal arteritis [facility])     Past Surgical History:  Procedure Laterality Date  . APPENDECTOMY  1940  . BACK SURGERY  04/1998   spinal stenosis  . BACK SURGERY  2000   Ruptured Disc  . BREAST BIOPSY     cysts in both breasts  . CARPAL TUNNEL RELEASE  2002  . ivc filter    . PARTIAL HYSTERECTOMY    . TONSILLECTOMY  1934  . TOTAL KNEE ARTHROPLASTY Left 04/1999  . TOTAL KNEE ARTHROPLASTY Right 02/2000    Current Medications: Outpatient Medications Prior to Visit  Medication Sig Dispense Refill  . amiodarone (PACERONE) 200 MG tablet Take 0.5 tablets (100 mg total) by mouth daily. 45  tablet 2  . Calcium Carb-Cholecalciferol (CALCIUM 600 + D) 600-200 MG-UNIT TABS Take 1 tablet by mouth 3 (three) times daily.    Marland Kitchen COUMADIN 2.5 MG tablet TAKE AS DIRECTED. 35 tablet 3  . levothyroxine (SYNTHROID, LEVOTHROID) 150 MCG tablet Take 150 mcg by mouth daily before breakfast.    . Misc Natural Products (GLUCOSAMINE CHONDROITIN COMPLX) TABS Take 1 tablet by mouth 2 (two) times daily.     . Multiple Vitamins-Minerals (OCUVITE ADULT 50+ PO) Take 1 tablet by mouth daily.    . prednisoLONE 5 MG TABS tablet Take 5 mg by mouth daily.      No facility-administered medications prior to visit.      Allergies:   Cefdinir; Cortisone; Prednisone; Sulfasalazine; and Sulfonamide derivatives   Social History   Social History  . Marital status: Widowed    Spouse name: N/A  . Number of children: 4  . Years of education: College   Occupational History  .  Retired    retired   Social History Main Topics  . Smoking status: Never Smoker  . Smokeless tobacco: Never Used  . Alcohol use No  . Drug use: No  . Sexual activity: No   Other Topics Concern  . None   Social History Narrative   Patient lives at home  alone.  Use walker to get around   Caffeine Use: rarely   Patient is right handed     Family History:  The patient's family history includes Breast cancer in her sister; Congestive Heart Failure in her mother; Stroke in her father.   ROS:   Please see the history of present illness.    She is frightened that this rapid heart rate will come back. She is resistant to increasing her amiodarone dose. She has stage III chronic kidney disease.  All other systems reviewed and are negative.   PHYSICAL EXAM:   VS:  BP (!) 148/64 (BP Location: Left Arm)   Pulse 73   Ht 5\' 4"  (1.626 m)   Wt 138 lb 12.8 oz (63 kg)   BMI 23.82 kg/m    GEN: Well nourished, well developed, in no acute distress  HEENT: normal  Neck: no JVD, carotid bruits, or masses Cardiac: RRR; no murmurs, rubs, or  gallops,no edema  Respiratory:  clear to auscultation bilaterally, normal work of breathing GI: soft, nontender, nondistended, + BS MS: no deformity or atrophy  Skin: warm and dry, no rash Neuro:  Alert and Oriented x 3, Strength and sensation are intact Psych: euthymic mood, full affect  Wt Readings from Last 3 Encounters:  12/22/15 138 lb 12.8 oz (63 kg)  12/13/15 137 lb (62.1 kg)  11/30/15 137 lb (62.1 kg)      Studies/Labs Reviewed:   EKG:  EKG  Not performed  Recent Labs: 11/30/2015: BUN 36; Creat 1.54; Hemoglobin 12.8; Magnesium 2.3; Platelets 247; Potassium 4.4; Sodium 138; TSH 4.15   Lipid Panel No results found for: CHOL, TRIG, HDL, CHOLHDL, VLDL, LDLCALC, LDLDIRECT  Additional studies/ records that were reviewed today include:  No new data other than labs that were performed in November as noted above.    ASSESSMENT:    1. Chronic diastolic heart failure (HCC)   2. Paroxysmal atrial fibrillation ([place])   3. On amiodarone therapy   4. CKD (chronic kidney disease), stage IV (HCC)      PLAN:  In order of problems listed above:  1. No volume overload on exam or based upon history. 2. Atrial fibrillation is controlled in sinus rhythm. 3. Liver panel and TSH was performed recently and was within normal limits. 4. In comparing the recent laboratory data with historical values, kidney function has not significantly changed. The normal creatinine obtained in March 2016 is the outlying.    Medication Adjustments/Labs and Tests Ordered: Current medicines are reviewed at length with the patient today.  Concerns regarding medicines are outlined above.  Medication changes, Labs and Tests ordered today are listed in the Patient Instructions below. Patient Instructions  Medication Instructions:  None  Labwork: TSH and liver at the time of follow up appointment or just prior to.  Testing/Procedures: None  Follow-Up: Your physician wants you to follow-up in: 6  months with Dr. [name].  You will receive a reminder letter in the mail two months in advance. If you don't receive a letter, please call our office to schedule the follow-up appointment.   Any Other Special Instructions Will Be Listed Below (If Applicable).     If you need a refill on your cardiac medications before your next appointment, please call your pharmacy.      Signed, Sinclair Grooms, MD  12/22/2015 4:01 PM    [facility] [address], [place],   [address] Phone: [phone]; Fax: [phone]

## 2015-12-22 NOTE — Patient Instructions (Signed)
Medication Instructions:  None  Labwork: TSH and liver at the time of follow up appointment or just prior to.  Testing/Procedures: None  Follow-Up: Your physician wants you to follow-up in: 6 months with Dr. [name].  You will receive a reminder letter in the mail two months in advance. If you don't receive a letter, please call our office to schedule the follow-up appointment.   Any Other Special Instructions Will Be Listed Below (If Applicable).     If you need a refill on your cardiac medications before your next appointment, please call your pharmacy.

## 2016-01-02 DIAGNOSIS — I509 Heart failure, unspecified: Secondary | ICD-10-CM | POA: Diagnosis not present

## 2016-01-02 DIAGNOSIS — N183 Chronic kidney disease, stage 3 (moderate): Secondary | ICD-10-CM | POA: Diagnosis not present

## 2016-01-02 DIAGNOSIS — I4891 Unspecified atrial fibrillation: Secondary | ICD-10-CM | POA: Diagnosis not present

## 2016-01-02 DIAGNOSIS — K529 Noninfective gastroenteritis and colitis, unspecified: Secondary | ICD-10-CM | POA: Diagnosis not present

## 2016-01-02 DIAGNOSIS — M069 Rheumatoid arthritis, unspecified: Secondary | ICD-10-CM | POA: Diagnosis not present

## 2016-01-02 DIAGNOSIS — E039 Hypothyroidism, unspecified: Secondary | ICD-10-CM | POA: Diagnosis not present

## 2016-01-02 DIAGNOSIS — E782 Mixed hyperlipidemia: Secondary | ICD-10-CM | POA: Diagnosis not present

## 2016-01-02 DIAGNOSIS — Z8719 Personal history of other diseases of the digestive system: Secondary | ICD-10-CM | POA: Diagnosis not present

## 2016-01-02 DIAGNOSIS — D649 Anemia, unspecified: Secondary | ICD-10-CM | POA: Diagnosis not present

## 2016-01-17 ENCOUNTER — Ambulatory Visit (INDEPENDENT_AMBULATORY_CARE_PROVIDER_SITE_OTHER): Payer: Medicare Other | Admitting: *Deleted

## 2016-01-17 DIAGNOSIS — I4891 Unspecified atrial fibrillation: Secondary | ICD-10-CM

## 2016-01-17 DIAGNOSIS — Z5181 Encounter for therapeutic drug level monitoring: Secondary | ICD-10-CM | POA: Diagnosis not present

## 2016-01-17 LAB — POCT INR: INR: 2.7

## 2016-01-18 DIAGNOSIS — Z6822 Body mass index (BMI) 22.0-22.9, adult: Secondary | ICD-10-CM | POA: Diagnosis not present

## 2016-01-18 DIAGNOSIS — M150 Primary generalized (osteo)arthritis: Secondary | ICD-10-CM | POA: Diagnosis not present

## 2016-01-18 DIAGNOSIS — M0609 Rheumatoid arthritis without rheumatoid factor, multiple sites: Secondary | ICD-10-CM | POA: Diagnosis not present

## 2016-01-18 DIAGNOSIS — G5601 Carpal tunnel syndrome, right upper limb: Secondary | ICD-10-CM | POA: Diagnosis not present

## 2016-01-19 DIAGNOSIS — J22 Unspecified acute lower respiratory infection: Secondary | ICD-10-CM | POA: Diagnosis not present

## 2016-01-19 DIAGNOSIS — J0100 Acute maxillary sinusitis, unspecified: Secondary | ICD-10-CM | POA: Diagnosis not present

## 2016-01-22 ENCOUNTER — Ambulatory Visit
Admission: RE | Admit: 2016-01-22 | Discharge: 2016-01-22 | Disposition: A | Discharge status: Home / Self Care | Payer: Medicare Other | Source: Ambulatory Visit | Attending: Family Medicine | Admitting: Family Medicine

## 2016-01-22 ENCOUNTER — Other Ambulatory Visit: Payer: Self-pay | Admitting: Family Medicine

## 2016-01-22 DIAGNOSIS — J209 Acute bronchitis, unspecified: Secondary | ICD-10-CM

## 2016-01-22 DIAGNOSIS — M79601 Pain in right arm: Secondary | ICD-10-CM | POA: Diagnosis not present

## 2016-01-22 DIAGNOSIS — R05 Cough: Secondary | ICD-10-CM | POA: Diagnosis not present

## 2016-01-22 DIAGNOSIS — R0602 Shortness of breath: Secondary | ICD-10-CM | POA: Diagnosis not present

## 2016-01-22 DIAGNOSIS — I5032 Chronic diastolic (congestive) heart failure: Secondary | ICD-10-CM | POA: Diagnosis not present

## 2016-01-29 ENCOUNTER — Telehealth: Payer: Self-pay | Admitting: Interventional Cardiology

## 2016-01-29 NOTE — Telephone Encounter (Signed)
New Message     Per Daughter she has been on augmentin for 10 days does she need to have coumadin checked again?

## 2016-01-29 NOTE — Telephone Encounter (Signed)
Returned call to patient's daughter and advised her that Augmentin will not interact with Coumadin. She verbalized understanding. Pt will keep next appt on 1/31.

## 2016-02-14 ENCOUNTER — Ambulatory Visit: Payer: Medicare Other | Admitting: Podiatry

## 2016-02-14 ENCOUNTER — Ambulatory Visit (INDEPENDENT_AMBULATORY_CARE_PROVIDER_SITE_OTHER): Payer: Medicare Other | Admitting: Podiatry

## 2016-02-14 ENCOUNTER — Ambulatory Visit (INDEPENDENT_AMBULATORY_CARE_PROVIDER_SITE_OTHER): Payer: Medicare Other | Admitting: *Deleted

## 2016-02-14 ENCOUNTER — Encounter: Payer: Self-pay | Admitting: Podiatry

## 2016-02-14 VITALS — Ht 64.0 in | Wt 138.0 lb

## 2016-02-14 DIAGNOSIS — I4891 Unspecified atrial fibrillation: Secondary | ICD-10-CM

## 2016-02-14 DIAGNOSIS — B351 Tinea unguium: Secondary | ICD-10-CM

## 2016-02-14 DIAGNOSIS — M79676 Pain in unspecified toe(s): Secondary | ICD-10-CM

## 2016-02-14 DIAGNOSIS — Z5181 Encounter for therapeutic drug level monitoring: Secondary | ICD-10-CM

## 2016-02-14 LAB — POCT INR: INR: 2.7

## 2016-02-14 NOTE — Progress Notes (Signed)
Patient ID: [name], female   DOB: 1920/03/07, 83 y.o.   MRN: [id number] Complaint:  Visit Type: Patient returns to my office for continued preventative foot care services. Complaint: Patient states" my nails have grown long and thick and become painful to walk and wear shoes" . The patient presents for preventative foot care services. No changes to ROS  Podiatric Exam: Vascular: dorsalis pedis and posterior tibial pulses are palpable bilateral. Capillary return is immediate. Temperature gradient is WNL. Skin turgor WNL  Sensorium: Diminished  Semmes Weinstein monofilament test. Normal tactile sensation bilaterally. Nail Exam: Pt has thick disfigured discolored nails with subungual debris noted bilateral entire nail hallux through fifth toenails Ulcer Exam: There is no evidence of ulcer or pre-ulcerative changes or infection. Orthopedic Exam: Muscle tone and strength are WNL. No limitations in general ROM. No crepitus or effusions noted. Foot type and digits show no abnormalities. Bony prominences are unremarkable. Skin: No Porokeratosis. No infection or ulcers  Diagnosis:  Onychomycosis, , Pain in right toe, pain in left toes  Treatment & Plan Procedures and Treatment: Consent by patient was obtained for treatment procedures. The patient understood the discussion of treatment and procedures well. All questions were answered thoroughly reviewed. Debridement of mycotic and hypertrophic toenails, 1 through 5 bilateral and clearing of subungual debris. No ulceration, no infection noted.  Return Visit-Office Procedure: Patient instructed to return to the office for a follow up visit 3 months for continued evaluation and treatment.   Gardiner Barefoot DPM

## 2016-03-12 ENCOUNTER — Ambulatory Visit (INDEPENDENT_AMBULATORY_CARE_PROVIDER_SITE_OTHER): Payer: Medicare Other | Admitting: *Deleted

## 2016-03-12 DIAGNOSIS — I4891 Unspecified atrial fibrillation: Secondary | ICD-10-CM | POA: Diagnosis not present

## 2016-03-12 DIAGNOSIS — Z5181 Encounter for therapeutic drug level monitoring: Secondary | ICD-10-CM | POA: Diagnosis not present

## 2016-03-12 LAB — POCT INR: INR: 2.7

## 2016-03-13 DIAGNOSIS — H353212 Exudative age-related macular degeneration, right eye, with inactive choroidal neovascularization: Secondary | ICD-10-CM | POA: Diagnosis not present

## 2016-04-09 ENCOUNTER — Ambulatory Visit (INDEPENDENT_AMBULATORY_CARE_PROVIDER_SITE_OTHER): Payer: Medicare Other

## 2016-04-09 DIAGNOSIS — I4891 Unspecified atrial fibrillation: Secondary | ICD-10-CM

## 2016-04-09 DIAGNOSIS — Z5181 Encounter for therapeutic drug level monitoring: Secondary | ICD-10-CM | POA: Diagnosis not present

## 2016-04-09 LAB — POCT INR: INR: 2.7

## 2016-04-16 DIAGNOSIS — Z0289 Encounter for other administrative examinations: Secondary | ICD-10-CM

## 2016-04-18 ENCOUNTER — Ambulatory Visit (INDEPENDENT_AMBULATORY_CARE_PROVIDER_SITE_OTHER): Payer: Medicare Other | Admitting: Podiatry

## 2016-04-18 DIAGNOSIS — M79676 Pain in unspecified toe(s): Secondary | ICD-10-CM

## 2016-04-18 DIAGNOSIS — B351 Tinea unguium: Secondary | ICD-10-CM

## 2016-04-18 NOTE — Progress Notes (Signed)
Patient ID: [name], female   DOB: February 09, 1920, 83 y.o.   MRN: [id number] Complaint:  Visit Type: Patient returns to my office for continued preventative foot care services. Complaint: Patient states" my nails have grown long and thick and become painful to walk and wear shoes" . The patient presents for preventative foot care services. No changes to ROS  Podiatric Exam: Vascular: dorsalis pedis and posterior tibial pulses are palpable bilateral. Capillary return is immediate. Temperature gradient is WNL. Skin turgor WNL  Sensorium: Diminished  Semmes Weinstein monofilament test. Normal tactile sensation bilaterally. Nail Exam: Pt has thick disfigured discolored nails with subungual debris noted bilateral entire nail hallux through fifth toenails Ulcer Exam: There is no evidence of ulcer or pre-ulcerative changes or infection. Orthopedic Exam: Muscle tone and strength are WNL. No limitations in general ROM. No crepitus or effusions noted. Foot type and digits show no abnormalities. Bony prominences are unremarkable. Skin: No Porokeratosis. No infection or ulcers  Diagnosis:  Onychomycosis, , Pain in right toe, pain in left toes  Treatment & Plan Procedures and Treatment: Consent by patient was obtained for treatment procedures. The patient understood the discussion of treatment and procedures well. All questions were answered thoroughly reviewed. Debridement of mycotic and hypertrophic toenails, 1 through 5 bilateral and clearing of subungual debris. No ulceration, no infection noted.  Return Visit-Office Procedure: Patient instructed to return to the office for a follow up visit 3 months for continued evaluation and treatment.   Gardiner Barefoot DPM

## 2016-04-23 DIAGNOSIS — R040 Epistaxis: Secondary | ICD-10-CM | POA: Diagnosis not present

## 2016-04-23 DIAGNOSIS — I4891 Unspecified atrial fibrillation: Secondary | ICD-10-CM | POA: Diagnosis not present

## 2016-04-23 DIAGNOSIS — M25552 Pain in left hip: Secondary | ICD-10-CM | POA: Diagnosis not present

## 2016-04-23 DIAGNOSIS — J0100 Acute maxillary sinusitis, unspecified: Secondary | ICD-10-CM | POA: Diagnosis not present

## 2016-04-25 ENCOUNTER — Ambulatory Visit (INDEPENDENT_AMBULATORY_CARE_PROVIDER_SITE_OTHER): Payer: Medicare Other | Admitting: Neurology

## 2016-04-25 ENCOUNTER — Encounter: Payer: Self-pay | Admitting: Neurology

## 2016-04-25 ENCOUNTER — Other Ambulatory Visit: Payer: Self-pay | Admitting: Interventional Cardiology

## 2016-04-25 VITALS — BP 142/52 | HR 99 | Ht 65.0 in | Wt 140.0 lb

## 2016-04-25 DIAGNOSIS — G250 Essential tremor: Secondary | ICD-10-CM | POA: Diagnosis not present

## 2016-04-25 DIAGNOSIS — R2689 Other abnormalities of gait and mobility: Secondary | ICD-10-CM

## 2016-04-25 NOTE — Patient Instructions (Addendum)
Your exam is fairly stable.   Please use your walker at all times. Don't walk backwards and don't turn too fast!  I can see you back as needed at this time.   Please try to hydrate well with water.

## 2016-04-25 NOTE — Progress Notes (Signed)
Subjective:    Patient ID: [name] is a 83 y.o. female.  HPI     Interim history:   [name] is a very pleasant 83 year old right-handed woman with a complex underlying history of atrial fibrillation, thyroid disease, giant cell arteritis, DVT with PE, hysterectomy, status post [place] filter placement, spinal stenosis with surgery in 2000, right knee replacement surgery in 2001, left knee replacement surgery in 2002, essential tremor, peripheral neuropathy, macular degeneration, and hearing loss, who presents for followup consultation of her multifactorial gait disorder, (likely secondary to peripheral neuropathy, advanced degenerative spine disease, blindness, hearing loss, vertigo, tremor) and essential tremor. She is accompanied by her daughter, [name] today. I last saw her on 10/24/2015, at which time she reported doing okay. She had 2 interim ER visits which I reviewed: on 03/25/2015 secondary to abdominal pain and was found to have partial small bowel obstruction, which was managed as an outpatient; 03/31/2015 for right hand swelling and had an x-ray of the right hand, 3 views, on 03/31/2015: IMPRESSION:  No evidence of fracture or dislocation. Mild degenerative change at the first carpometacarpal joint, and at the ulnar aspect of the carpal rows.   She was diagnosed with rheumatoid arthritis and first saw orthopedics, then rheumatology. She was placed on a steroid taper and then on maintenance prednisone 5 mg daily. She developed severe left hip pain which was gradually improving. She had no recent falls but a near fall. She felt that her tremor was slightly worse.  Today, 04/25/2016 (all dictated new, as well as above notes, some dictation done in note pad or Word, outside of chart, may appear as copied):  She reports doing well, had to be on antibiotic for recent infection, also steroid taper for bursitis of the left hip and hip arthritis. She had no recent fall, she uses her walker  consistently. She's trying to stay hydrated.   The patient's allergies, current medications, family history, past medical history, past social history, past surgical history and problem list were reviewed and updated as appropriate.   Previously (copied from previous notes for reference):   I saw her on 03/23/2015, at which time she reported more weakness in her lower extremities and lower back pain. She was trying to do exercises sitting down and was walking with her walker. She had cataract surgery on the right. She did have a recent fall and bumped her head. She had no loss of consciousness and no headache, she felt that her tremor was worse as well.    I saw her on 09/22/2014, at which time she reported that her headaches on the R side had resolved. She saw another dentist and was told to use a hollowed out pillow and was given a mouth piece for TMJ and this had helped. She had intermittent right arm pain and weakness and numbness and grip weakness. She had neck pain and saw her orthopedic doctor, had PT and felt better. I suggested she continue using her walker and drink water. I suggested gentle strengthening and range of motion exercises for her upper body.   I saw her on 04/05/2014, at which time she reported a one month Hx of intermittent dull headache and some sharp shooting pains on the right side of her head. Symptoms were different from when she was diagnosed in the past with giant cell arteritis. Nevertheless, she saw her primary care physician who ordered a head CT. She had a head CT on 03/18/2014: Mild patchy periventricular small vessel  disease. No intracranial mass, hemorrhage, or acute appearing infarct. In addition, I personally reviewed the images through the PACS system. She also reported having had a sedimentation rate and CRP checked both of which were negative for concern for temporal arteritis. She saw her ophthalmologist who did not see any new problems explaining the situation.  She had been diagnosed with wet macular degeneration and had injections into both eyes for treatment of this. Symptoms coincidentally started after she had her eye injection, about one week after she had her right I injected. She had her left eye injected a week after the right. She had no new one-sided neurological symptoms such as weakness, numbness, tingling, no facial droop, she did have some joint pain in her TMJ on the right. She saw her dentist. She said he was not concerned. She was advised not to chew on hard foods and try to eat softer foods. I suggested we proceed with a brain MRI contrast, which she had on 04/26/14: Mildly abnormal MRI brain (without) demonstrating: 1. Scattered periventricular and subcortical foci of non-specific gliosis, likely chronic small vessel ischemic disease. 2. No acute findings. In addition, I personally reviewed the images through the PACS system. We called her with her test results.   I saw her on 09/21/2013, at which time she reported having had a UTI. She had stayed with her daughter. She had not fallen. Nocturia became worse. She was complaining of leg pain. She presents for a sooner than scheduled appointment secondary to new onset jaw pain.   I saw her on 03/18/2013, at which time her daughter reported that the patient was hospitalized for about 10 days in January 2015 for small bowel obstruction and symptoms consisting of abdominal pain, nausea and vomiting. Her hospitalization was complicated by aspiration pneumonia, A. fib, pulmonary embolus, nausea, vomiting, microscopic colitis, leukocytosis, CKD (chronic kidney disease), stage IV. She had home health therapies and has a emergency call button. She continues to live alone. She has no caregiver at home on a daily basis, but her kids check with her frequently. She has help with cleaning. She cooks at the stove and occasionally uses a knife. Her INR was stable. I talked to her and her daughter at length last  time and I was particularly concerned about her continuing to live alone and working in the kitchen and at the stove because she is visually impaired and has had balance problems, gait disorder, abnormal posture and I advised her to have someone at the house for help on a day-to-day basis. She was advised to use her walker at all times. I did not suggest any new medications.   I first met her on 09/08/2012, at which time he felt she had remained fairly stable and I did not suggest any medication changes. My main concern was that she continued to live alone and I talked to her and her daughter about that. Because of her visual and hearing impairment, balance problems, and gait dysfunction and abnormal posture as well as her age I felt that she needed to start a discussion with her family about an alternative living situation. I advised her to use her walker at all times.   She previously followed with Dr. Morene Antu and was last seen by Dr. Erling Cruz on 03/12/2012, at which time he felt that she was stable and that she was doing well living by herself. She no longer drives. She sees Dr. Hardin Negus for pain management. She has had bladder incontinence. She has  a long-standing history of peripheral neuropathy. She has a 2 story home. 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 One daughter is in [place], one in [place] and [name] is local and her son lives at [place]. She has a life alert button.    Her Past Medical History Is Significant For: Past Medical History:  Diagnosis Date  . Blindness   . Blood clot associated with vein wall inflammation   . CKD (chronic kidney disease), stage III   . Colitis   . Goiter    s/p radioactive iod. tx  . Hypothyroidism   . On amiodarone therapy   . Paroxysmal atrial fibrillation (HCC)   . Pulmonary embolus (HCC)    and DVT s/p IVC filter  . SBO (small bowel obstruction)   . Spinal stenosis of lumbar region with radiculopathy    possible  neurogenic claudication recently?  . Temporal arteritis ([place])     Her Past Surgical History Is Significant For: Past Surgical History:  Procedure Laterality Date  . APPENDECTOMY  1940  . BACK SURGERY  04/1998   spinal stenosis  . BACK SURGERY  2000   Ruptured Disc  . BREAST BIOPSY     cysts in both breasts  . CARPAL TUNNEL RELEASE  2002  . ivc filter    . PARTIAL HYSTERECTOMY    . TONSILLECTOMY  1934  . TOTAL KNEE ARTHROPLASTY Left 04/1999  . TOTAL KNEE ARTHROPLASTY Right 02/2000    Her Family History Is Significant For: Family History  Problem Relation Age of Onset  . Breast cancer Sister   . Congestive Heart Failure Mother   . Stroke Father   . Crohn's disease Neg Hx   . Ulcerative colitis Neg Hx     Her Social History Is Significant For: Social History   Social History  . Marital status: Widowed    Spouse name: N/A  . Number of children: 4  . Years of education: College   Occupational History  .  Retired    retired   Social History Main Topics  . Smoking status: Never Smoker  . Smokeless tobacco: Never Used  . Alcohol use No  . Drug use: No  . Sexual activity: No   Other Topics Concern  . None   Social History Narrative   Patient lives at home alone.  Use walker to get around   Caffeine Use: rarely   Patient is right handed    Her Allergies Are:  Allergies  Allergen Reactions  . Cefdinir     Caused bad diarrhea  . Cortisone     Flushed hands and face   . Prednisone     cuased blood clots  . Sulfasalazine     Nausea side effects  . Sulfonamide Derivatives     Stomach pains and cramping   :   Her Current Medications Are:  Outpatient Encounter Prescriptions as of 04/25/2016  Medication Sig  . amiodarone (PACERONE) 200 MG tablet Take 0.5 tablets (100 mg total) by mouth daily.  . AMOXICILLIN PO Take 10 mg by mouth.  . Calcium Carb-Cholecalciferol (CALCIUM 600 + D) 600-200 MG-UNIT TABS Take 1 tablet by mouth 3 (three) times daily.  Marland Kitchen  COUMADIN 2.5 MG tablet TAKE AS DIRECTED.  Marland Kitchen levothyroxine (SYNTHROID, LEVOTHROID) 150 MCG tablet Take 150 mcg by mouth daily before breakfast.  . Misc Natural Products (GLUCOSAMINE CHONDROITIN COMPLX) TABS Take 1 tablet by mouth 2 (two) times daily.   Marland Kitchen  Multiple Vitamins-Minerals (OCUVITE ADULT 50+ PO) Take 1 tablet by mouth daily.  Marland Kitchen PREDNISONE PO Take by mouth. Prednisone taper  . prednisoLONE 5 MG TABS tablet Take 5 mg by mouth daily.    No facility-administered encounter medications on file as of 04/25/2016.   :  Review of Systems:  Out of a complete 14 point review of systems, all are reviewed and negative with the exception of these symptoms as listed below: Review of Systems  Neurological:       Pt presents today to follow up. Pt has sinus infection and is taking amoxicillin. Pt is also having a flare of RA and is on a prednisone taper. Pt is also having problems with her left hip.    Objective:  Neurologic Exam  Physical Exam Physical Examination:   Vitals:   04/25/16 1044  BP: (!) 142/52  Pulse: 99   General Examination: The patient is a very pleasant 83 y.o. female in no acute distress. She appears well-developed and well-nourished and very well groomed.   HEENT: Normocephalic, atraumatic, pupils are equal, round and reactive to light, She is hearing impaired, she is visually impaired. Oropharynx examination reveals of mild voice tremor. She has a prominent lower jaw tremor.  Chest: Clear to auscultation without wheezing, rhonchi or crackles noted.  Heart: S1+S2+0, regular and normal without murmurs, rubs or gallops noted.   Abdomen: Soft, non-tender and non-distended with normal bowel sounds appreciated on auscultation.  Extremities: There is no pitting edema in the distal lower extremities bilaterally. Pedal pulses are intact.  Skin: Warm and dry without trophic changes noted.  Musculoskeletal: exam reveals no obvious joint deformities, tenderness or joint  swelling or erythema. Mild puffiness noted right ankle.  Neurologically:  Mental status: The patient is awake, alert and oriented in all 4 spheres. Her immediate and remote memory, attention, language skills and fund of knowledge are appropriate for age. Thought process is linear. Mood is normal and affect is normal.  Cranial nerves II - XII are as described above under HEENT exam. In addition: shoulder shrug is normal with equal shoulder height noted. Motor exam: Thin bulk, global strength of 4 out of 5, normal tone. Reflexes are 1+. Fine motor skills are mildly impaired globally. She has no significant resting tremor, minimal bilateral upper extremity postural and action tremor. She stands with difficulty, she walks with her 4 wheeled walker. She has a tendency to turn too quickly. Romberg is not possible to test safely, tandem walk is not possible for her. Sensory exam is intact to light touch.  Assessment and Plan:   In summary, [name] is a very pleasant 83 y.o.-year old female with an underlying medical history of peripheral neuropathy, chronic degenerative spine disease, blindness, hearing loss, vertigo, and essential tremor and a gait disorder who presents for follow-up consultation of her gait disorder and essential tremor. She  has not had any recent falls. She has been using her walker. She has a good support system with her kids. Workup has included a brain MRI. We have decided not to try new medication for her ET. Her gait disorder is multifactorial, secondary to spinal stenosis, peripheral neuropathy, tremors, visual impairment, hearing impairment, aging, abnormal posture and ET. I encouraged her to continue with strengthening and range of motion exercises and supportive care. At this juncture, I suggested as needed follow-up. I answered all their questions today and the patient and her daughter were in agreement. I spent 25 minutes in total face-to-face time  with the patient, more  than 50% of which was spent in counseling and coordination of care, reviewing test results, reviewing medication and discussing or reviewing the diagnosis of  gait disorder and tremors, the prognosis and treatment options.

## 2016-05-02 DIAGNOSIS — H903 Sensorineural hearing loss, bilateral: Secondary | ICD-10-CM | POA: Diagnosis not present

## 2016-05-06 DIAGNOSIS — N3946 Mixed incontinence: Secondary | ICD-10-CM | POA: Diagnosis not present

## 2016-05-07 ENCOUNTER — Ambulatory Visit (INDEPENDENT_AMBULATORY_CARE_PROVIDER_SITE_OTHER): Payer: Medicare Other | Admitting: *Deleted

## 2016-05-07 DIAGNOSIS — I4891 Unspecified atrial fibrillation: Secondary | ICD-10-CM | POA: Diagnosis not present

## 2016-05-07 DIAGNOSIS — Z5181 Encounter for therapeutic drug level monitoring: Secondary | ICD-10-CM

## 2016-05-07 LAB — POCT INR: INR: 2.4

## 2016-05-28 DIAGNOSIS — M5416 Radiculopathy, lumbar region: Secondary | ICD-10-CM | POA: Diagnosis not present

## 2016-05-28 DIAGNOSIS — M150 Primary generalized (osteo)arthritis: Secondary | ICD-10-CM | POA: Diagnosis not present

## 2016-05-28 DIAGNOSIS — M25552 Pain in left hip: Secondary | ICD-10-CM | POA: Diagnosis not present

## 2016-05-28 DIAGNOSIS — G5601 Carpal tunnel syndrome, right upper limb: Secondary | ICD-10-CM | POA: Diagnosis not present

## 2016-05-28 DIAGNOSIS — M0609 Rheumatoid arthritis without rheumatoid factor, multiple sites: Secondary | ICD-10-CM | POA: Diagnosis not present

## 2016-05-28 DIAGNOSIS — Z6822 Body mass index (BMI) 22.0-22.9, adult: Secondary | ICD-10-CM | POA: Diagnosis not present

## 2016-06-05 ENCOUNTER — Ambulatory Visit (INDEPENDENT_AMBULATORY_CARE_PROVIDER_SITE_OTHER): Payer: Medicare Other | Admitting: *Deleted

## 2016-06-05 DIAGNOSIS — Z5181 Encounter for therapeutic drug level monitoring: Secondary | ICD-10-CM | POA: Diagnosis not present

## 2016-06-05 DIAGNOSIS — M5416 Radiculopathy, lumbar region: Secondary | ICD-10-CM | POA: Diagnosis not present

## 2016-06-05 DIAGNOSIS — M150 Primary generalized (osteo)arthritis: Secondary | ICD-10-CM | POA: Diagnosis not present

## 2016-06-05 DIAGNOSIS — G5601 Carpal tunnel syndrome, right upper limb: Secondary | ICD-10-CM | POA: Diagnosis not present

## 2016-06-05 DIAGNOSIS — M25552 Pain in left hip: Secondary | ICD-10-CM | POA: Diagnosis not present

## 2016-06-05 DIAGNOSIS — Z6821 Body mass index (BMI) 21.0-21.9, adult: Secondary | ICD-10-CM | POA: Diagnosis not present

## 2016-06-05 DIAGNOSIS — M0609 Rheumatoid arthritis without rheumatoid factor, multiple sites: Secondary | ICD-10-CM | POA: Diagnosis not present

## 2016-06-05 LAB — POCT INR: INR: 3.2

## 2016-06-13 DIAGNOSIS — M5441 Lumbago with sciatica, right side: Secondary | ICD-10-CM | POA: Diagnosis not present

## 2016-06-13 DIAGNOSIS — M5442 Lumbago with sciatica, left side: Secondary | ICD-10-CM | POA: Diagnosis not present

## 2016-06-13 DIAGNOSIS — M5136 Other intervertebral disc degeneration, lumbar region: Secondary | ICD-10-CM | POA: Diagnosis not present

## 2016-06-13 DIAGNOSIS — G8929 Other chronic pain: Secondary | ICD-10-CM | POA: Diagnosis not present

## 2016-06-21 ENCOUNTER — Other Ambulatory Visit: Payer: Self-pay | Admitting: Interventional Cardiology

## 2016-06-21 DIAGNOSIS — M5136 Other intervertebral disc degeneration, lumbar region: Secondary | ICD-10-CM | POA: Diagnosis not present

## 2016-06-25 DIAGNOSIS — M5136 Other intervertebral disc degeneration, lumbar region: Secondary | ICD-10-CM | POA: Diagnosis not present

## 2016-06-27 DIAGNOSIS — M5441 Lumbago with sciatica, right side: Secondary | ICD-10-CM | POA: Diagnosis not present

## 2016-06-27 DIAGNOSIS — M5136 Other intervertebral disc degeneration, lumbar region: Secondary | ICD-10-CM | POA: Diagnosis not present

## 2016-06-27 DIAGNOSIS — M5442 Lumbago with sciatica, left side: Secondary | ICD-10-CM | POA: Diagnosis not present

## 2016-06-27 DIAGNOSIS — G8929 Other chronic pain: Secondary | ICD-10-CM | POA: Diagnosis not present

## 2016-07-02 DIAGNOSIS — M069 Rheumatoid arthritis, unspecified: Secondary | ICD-10-CM | POA: Diagnosis not present

## 2016-07-02 DIAGNOSIS — Z8719 Personal history of other diseases of the digestive system: Secondary | ICD-10-CM | POA: Diagnosis not present

## 2016-07-02 DIAGNOSIS — K529 Noninfective gastroenteritis and colitis, unspecified: Secondary | ICD-10-CM | POA: Diagnosis not present

## 2016-07-02 DIAGNOSIS — I4891 Unspecified atrial fibrillation: Secondary | ICD-10-CM | POA: Diagnosis not present

## 2016-07-02 DIAGNOSIS — I509 Heart failure, unspecified: Secondary | ICD-10-CM | POA: Diagnosis not present

## 2016-07-02 DIAGNOSIS — M5432 Sciatica, left side: Secondary | ICD-10-CM | POA: Diagnosis not present

## 2016-07-02 DIAGNOSIS — N183 Chronic kidney disease, stage 3 (moderate): Secondary | ICD-10-CM | POA: Diagnosis not present

## 2016-07-02 DIAGNOSIS — D649 Anemia, unspecified: Secondary | ICD-10-CM | POA: Diagnosis not present

## 2016-07-02 DIAGNOSIS — E039 Hypothyroidism, unspecified: Secondary | ICD-10-CM | POA: Diagnosis not present

## 2016-07-02 DIAGNOSIS — E782 Mixed hyperlipidemia: Secondary | ICD-10-CM | POA: Diagnosis not present

## 2016-07-03 DIAGNOSIS — G8929 Other chronic pain: Secondary | ICD-10-CM | POA: Diagnosis not present

## 2016-07-03 DIAGNOSIS — M5442 Lumbago with sciatica, left side: Secondary | ICD-10-CM | POA: Diagnosis not present

## 2016-07-07 DIAGNOSIS — Z5181 Encounter for therapeutic drug level monitoring: Secondary | ICD-10-CM | POA: Insufficient documentation

## 2016-07-07 DIAGNOSIS — Z79899 Other long term (current) drug therapy: Secondary | ICD-10-CM

## 2016-07-07 NOTE — Progress Notes (Signed)
Cardiology Office Note    Date:  07/08/2016   ID:  [name], [name] October 30, 1920, MRN [id number]  PCP:  Antony Contras, MD  Cardiologist: Sinclair Grooms, MD   Chief Complaint  Patient presents with  . Atrial Fibrillation    History of Present Illness:  [name] is a 83 y.o. female  paroxysmal atrial fibrillation, chronic amiodarone therapy, history of PE, chronic anticoagulation therapy, and chronic kidney disease.  Unable to do much walking because of balance, vision, and difficulty with leg weakness. No palpitations or chest pain. Denies dyspnea.   Past Medical History:  Diagnosis Date  . Blindness   . Blood clot associated with vein wall inflammation   . CKD (chronic kidney disease), stage III   . Colitis   . Goiter    s/p radioactive iod. tx  . Hypothyroidism   . On amiodarone therapy   . Paroxysmal atrial fibrillation (HCC)   . Pulmonary embolus (HCC)    and DVT s/p IVC filter  . SBO (small bowel obstruction) ([place])   . Spinal stenosis of lumbar region with radiculopathy    possible neurogenic claudication recently?  . Temporal arteritis [facility])     Past Surgical History:  Procedure Laterality Date  . APPENDECTOMY  1940  . BACK SURGERY  04/1998   spinal stenosis  . BACK SURGERY  2000   Ruptured Disc  . BREAST BIOPSY     cysts in both breasts  . CARPAL TUNNEL RELEASE  2002  . ivc filter    . PARTIAL HYSTERECTOMY    . TONSILLECTOMY  1934  . TOTAL KNEE ARTHROPLASTY Left 04/1999  . TOTAL KNEE ARTHROPLASTY Right 02/2000    Current Medications: Outpatient Medications Prior to Visit  Medication Sig Dispense Refill  . amiodarone (PACERONE) 200 MG tablet Take 0.5 tablets (100 mg total) by mouth daily. 45 tablet 1  . Calcium Carb-Cholecalciferol (CALCIUM 600 + D) 600-200 MG-UNIT TABS Take 1 tablet by mouth 3 (three) times daily.    Marland Kitchen COUMADIN 2.5 MG tablet TAKE AS DIRECTED. 35 tablet 3  . levothyroxine (SYNTHROID, LEVOTHROID) 150 MCG tablet Take 150 mcg by  mouth daily before breakfast.    . Misc Natural Products (GLUCOSAMINE CHONDROITIN COMPLX) TABS Take 1 tablet by mouth 2 (two) times daily.     . Multiple Vitamins-Minerals (OCUVITE ADULT 50+ PO) Take 1 tablet by mouth daily.    . prednisoLONE 5 MG TABS tablet Take 5 mg by mouth daily.     . AMOXICILLIN PO Take 10 mg by mouth.    . Saccharomyces boulardii (FLORASTOR PO) Take by mouth.     No facility-administered medications prior to visit.      Allergies:   Cefdinir; Cortisone; Prednisone; Sulfasalazine; and Sulfonamide derivatives   Social History   Social History  . Marital status: Widowed    Spouse name: N/A  . Number of children: 4  . Years of education: College   Occupational History  .  Retired    retired   Social History Main Topics  . Smoking status: Never Smoker  . Smokeless tobacco: Never Used  . Alcohol use No  . Drug use: No  . Sexual activity: No   Other Topics Concern  . None   Social History Narrative   Patient lives at home alone.  Use walker to get around   Caffeine Use: rarely   Patient is right handed     Family History:  The patient's family history  includes Breast cancer in her sister; Congestive Heart Failure in her mother; Stroke in her father.   ROS:   Please see the history of present illness.    No bleeding on Coumadin. No head trauma. Some shortness of breath with activity. Leg pain and back pain are persistent.  All other systems reviewed and are negative.   PHYSICAL EXAM:   VS:  BP 130/60 (BP Location: Left Arm)   Pulse 63   Ht 5\' 5"  (1.651 m)   Wt 139 lb 12.8 oz (63.4 kg)   BMI 23.26 kg/m    GEN: Well nourished, well developed, in no acute distress  HEENT: normal  Neck: no JVD, carotid bruits, or masses Cardiac: RRR; no murmurs, rubs, or gallops,no edema  Respiratory:  clear to auscultation bilaterally, normal work of breathing GI: soft, nontender, nondistended, + BS MS: no deformity or atrophy  Skin: warm and dry, no  rash Neuro:  Alert and Oriented x 3, Strength and sensation are intact Psych: euthymic mood, full affect  Wt Readings from Last 3 Encounters:  07/08/16 139 lb 12.8 oz (63.4 kg)  04/25/16 140 lb (63.5 kg)  02/14/16 138 lb (62.6 kg)      Studies/Labs Reviewed:   EKG:  EKG  none  Recent Labs: 11/30/2015: BUN 36; Creat 1.54; Hemoglobin 12.8; Magnesium 2.3; Platelets 247; Potassium 4.4; Sodium 138; TSH 4.15   Lipid Panel No results found for: CHOL, TRIG, HDL, CHOLHDL, VLDL, LDLCALC, LDLDIRECT  Additional studies/ records that were reviewed today include:  none    ASSESSMENT:    1. Paroxysmal atrial fibrillation (HCC)   2. Chronic diastolic heart failure ([place])   3. CKD (chronic kidney disease), stage III   4. Encounter for monitoring amiodarone therapy      PLAN:  In order of problems listed above:  1. Control rhythm on low-dose amiodarone. TSH was normal on Levoxyl 150 g daily. Liver panel is pending at the time of this dictation. Last chest x-ray was January 2018 without any significant abnormality. 2. No evidence of volume overload. 3. Kidney function is stable with most recent creatinine of 1.42 on 07/02/2016. 4. There is no evidence of amiodarone toxicity. Chest x-ray will be done on return visit in January. TSH and hepatic panel will be done at that time as well.  Overall no change in current medical regimen. Continue amiodarone as listed. Six-month clinical follow-up.    Medication Adjustments/Labs and Tests Ordered: Current medicines are reviewed at length with the patient today.  Concerns regarding medicines are outlined above.  Medication changes, Labs and Tests ordered today are listed in the Patient Instructions below. Patient Instructions  Medication Instructions:  None  Labwork: TSH and liver panel prior to follow up appointment in 6 months.  Testing/Procedures: A chest x-ray takes a picture of the organs and structures inside the chest, including the  heart, lungs, and blood vessels. This test can show several things, including, whether the heart is enlarges; whether fluid is building up in the lungs; and whether pacemaker / defibrillator leads are still in place. This needs to be done just prior to your follow up appointment in January 2019.  Follow-Up: Your physician wants you to follow-up in: 6 months with Dr. [name].  You will receive a reminder letter in the mail two months in advance. If you don't receive a letter, please call our office to schedule the follow-up appointment.   Any Other Special Instructions Will Be Listed Below (If Applicable).  If you need a refill on your cardiac medications before your next appointment, please call your pharmacy.      Signed, Sinclair Grooms, MD  07/08/2016 10:12 AM    [facility] [place], [place], [place]  [address] Phone: [phone]; Fax: [phone]

## 2016-07-08 ENCOUNTER — Ambulatory Visit (INDEPENDENT_AMBULATORY_CARE_PROVIDER_SITE_OTHER): Payer: Medicare Other | Admitting: Interventional Cardiology

## 2016-07-08 ENCOUNTER — Encounter: Payer: Self-pay | Admitting: Interventional Cardiology

## 2016-07-08 ENCOUNTER — Ambulatory Visit (INDEPENDENT_AMBULATORY_CARE_PROVIDER_SITE_OTHER): Payer: Medicare Other | Admitting: *Deleted

## 2016-07-08 VITALS — BP 130/60 | HR 63 | Ht 65.0 in | Wt 139.8 lb

## 2016-07-08 DIAGNOSIS — Z79899 Other long term (current) drug therapy: Secondary | ICD-10-CM | POA: Diagnosis not present

## 2016-07-08 DIAGNOSIS — I480 Paroxysmal atrial fibrillation: Secondary | ICD-10-CM

## 2016-07-08 DIAGNOSIS — Z5181 Encounter for therapeutic drug level monitoring: Secondary | ICD-10-CM

## 2016-07-08 DIAGNOSIS — N183 Chronic kidney disease, stage 3 unspecified: Secondary | ICD-10-CM

## 2016-07-08 DIAGNOSIS — M5442 Lumbago with sciatica, left side: Secondary | ICD-10-CM | POA: Diagnosis not present

## 2016-07-08 DIAGNOSIS — I5032 Chronic diastolic (congestive) heart failure: Secondary | ICD-10-CM

## 2016-07-08 DIAGNOSIS — G8929 Other chronic pain: Secondary | ICD-10-CM | POA: Diagnosis not present

## 2016-07-08 LAB — POCT INR: INR: 1.9

## 2016-07-08 NOTE — Patient Instructions (Signed)
Medication Instructions:  None  Labwork: TSH and liver panel prior to follow up appointment in 6 months.  Testing/Procedures: A chest x-ray takes a picture of the organs and structures inside the chest, including the heart, lungs, and blood vessels. This test can show several things, including, whether the heart is enlarges; whether fluid is building up in the lungs; and whether pacemaker / defibrillator leads are still in place. This needs to be done just prior to your follow up appointment in January 2019.  Follow-Up: Your physician wants you to follow-up in: 6 months with Dr. [name].  You will receive a reminder letter in the mail two months in advance. If you don't receive a letter, please call our office to schedule the follow-up appointment.   Any Other Special Instructions Will Be Listed Below (If Applicable).     If you need a refill on your cardiac medications before your next appointment, please call your pharmacy.

## 2016-07-10 DIAGNOSIS — H353232 Exudative age-related macular degeneration, bilateral, with inactive choroidal neovascularization: Secondary | ICD-10-CM | POA: Diagnosis not present

## 2016-07-10 DIAGNOSIS — H43813 Vitreous degeneration, bilateral: Secondary | ICD-10-CM | POA: Diagnosis not present

## 2016-07-11 DIAGNOSIS — G8929 Other chronic pain: Secondary | ICD-10-CM | POA: Diagnosis not present

## 2016-07-11 DIAGNOSIS — M5442 Lumbago with sciatica, left side: Secondary | ICD-10-CM | POA: Diagnosis not present

## 2016-07-16 DIAGNOSIS — G8929 Other chronic pain: Secondary | ICD-10-CM | POA: Diagnosis not present

## 2016-07-16 DIAGNOSIS — G5601 Carpal tunnel syndrome, right upper limb: Secondary | ICD-10-CM | POA: Diagnosis not present

## 2016-07-16 DIAGNOSIS — M150 Primary generalized (osteo)arthritis: Secondary | ICD-10-CM | POA: Diagnosis not present

## 2016-07-16 DIAGNOSIS — M5442 Lumbago with sciatica, left side: Secondary | ICD-10-CM | POA: Diagnosis not present

## 2016-07-16 DIAGNOSIS — Z6822 Body mass index (BMI) 22.0-22.9, adult: Secondary | ICD-10-CM | POA: Diagnosis not present

## 2016-07-16 DIAGNOSIS — M5416 Radiculopathy, lumbar region: Secondary | ICD-10-CM | POA: Diagnosis not present

## 2016-07-16 DIAGNOSIS — M25552 Pain in left hip: Secondary | ICD-10-CM | POA: Diagnosis not present

## 2016-07-16 DIAGNOSIS — M0609 Rheumatoid arthritis without rheumatoid factor, multiple sites: Secondary | ICD-10-CM | POA: Diagnosis not present

## 2016-07-18 DIAGNOSIS — G8929 Other chronic pain: Secondary | ICD-10-CM | POA: Diagnosis not present

## 2016-07-18 DIAGNOSIS — M5442 Lumbago with sciatica, left side: Secondary | ICD-10-CM | POA: Diagnosis not present

## 2016-07-19 ENCOUNTER — Ambulatory Visit (INDEPENDENT_AMBULATORY_CARE_PROVIDER_SITE_OTHER): Payer: Medicare Other | Admitting: Podiatry

## 2016-07-19 ENCOUNTER — Encounter: Payer: Self-pay | Admitting: Podiatry

## 2016-07-19 DIAGNOSIS — M79676 Pain in unspecified toe(s): Secondary | ICD-10-CM | POA: Diagnosis not present

## 2016-07-19 DIAGNOSIS — B351 Tinea unguium: Secondary | ICD-10-CM

## 2016-07-19 NOTE — Progress Notes (Signed)
Patient ID: [name], female   DOB: 09/03/1920, 84 y.o.   MRN: [id number] Complaint:  Visit Type: Patient returns to my office for continued preventative foot care services. Complaint: Patient states" my nails have grown long and thick and become painful to walk and wear shoes" . The patient presents for preventative foot care services. No changes to ROS  Podiatric Exam: Vascular: dorsalis pedis and posterior tibial pulses are palpable bilateral. Capillary return is immediate. Temperature gradient is WNL. Skin turgor WNL  Sensorium: Diminished  Semmes Weinstein monofilament test. Normal tactile sensation bilaterally. Nail Exam: Pt has thick disfigured discolored nails with subungual debris noted bilateral entire nail hallux through fifth toenails Ulcer Exam: There is no evidence of ulcer or pre-ulcerative changes or infection. Orthopedic Exam: Muscle tone and strength are WNL. No limitations in general ROM. No crepitus or effusions noted. Foot type and digits show no abnormalities. Bony prominences are unremarkable. Skin: No Porokeratosis. No infection or ulcers  Diagnosis:  Onychomycosis, , Pain in right toe, pain in left toes  Treatment & Plan Procedures and Treatment: Consent by patient was obtained for treatment procedures. The patient understood the discussion of treatment and procedures well. All questions were answered thoroughly reviewed. Debridement of mycotic and hypertrophic toenails, 1 through 5 bilateral and clearing of subungual debris. No ulceration, no infection noted.  Return Visit-Office Procedure: Patient instructed to return to the office for a follow up visit 3 months for continued evaluation and treatment.   Gardiner Barefoot DPM

## 2016-07-29 ENCOUNTER — Ambulatory Visit (INDEPENDENT_AMBULATORY_CARE_PROVIDER_SITE_OTHER): Payer: Medicare Other | Admitting: Pharmacist Clinician (PhC)/ Clinical Pharmacy Specialist

## 2016-07-29 DIAGNOSIS — I480 Paroxysmal atrial fibrillation: Secondary | ICD-10-CM

## 2016-07-29 DIAGNOSIS — M5136 Other intervertebral disc degeneration, lumbar region: Secondary | ICD-10-CM | POA: Diagnosis not present

## 2016-07-29 DIAGNOSIS — Z5181 Encounter for therapeutic drug level monitoring: Secondary | ICD-10-CM

## 2016-07-29 LAB — POCT INR: INR: 2.1

## 2016-07-30 DIAGNOSIS — M5442 Lumbago with sciatica, left side: Secondary | ICD-10-CM | POA: Diagnosis not present

## 2016-07-30 DIAGNOSIS — G8929 Other chronic pain: Secondary | ICD-10-CM | POA: Diagnosis not present

## 2016-07-31 DIAGNOSIS — K529 Noninfective gastroenteritis and colitis, unspecified: Secondary | ICD-10-CM | POA: Diagnosis not present

## 2016-08-01 DIAGNOSIS — G8929 Other chronic pain: Secondary | ICD-10-CM | POA: Diagnosis not present

## 2016-08-01 DIAGNOSIS — M5442 Lumbago with sciatica, left side: Secondary | ICD-10-CM | POA: Diagnosis not present

## 2016-08-05 DIAGNOSIS — M5442 Lumbago with sciatica, left side: Secondary | ICD-10-CM | POA: Diagnosis not present

## 2016-08-05 DIAGNOSIS — G8929 Other chronic pain: Secondary | ICD-10-CM | POA: Diagnosis not present

## 2016-08-08 DIAGNOSIS — G8929 Other chronic pain: Secondary | ICD-10-CM | POA: Diagnosis not present

## 2016-08-08 DIAGNOSIS — M5442 Lumbago with sciatica, left side: Secondary | ICD-10-CM | POA: Diagnosis not present

## 2016-08-13 DIAGNOSIS — M5442 Lumbago with sciatica, left side: Secondary | ICD-10-CM | POA: Diagnosis not present

## 2016-08-13 DIAGNOSIS — G8929 Other chronic pain: Secondary | ICD-10-CM | POA: Diagnosis not present

## 2016-08-16 DIAGNOSIS — R300 Dysuria: Secondary | ICD-10-CM | POA: Diagnosis not present

## 2016-08-16 DIAGNOSIS — Z1389 Encounter for screening for other disorder: Secondary | ICD-10-CM | POA: Diagnosis not present

## 2016-08-22 ENCOUNTER — Telehealth: Payer: Self-pay | Admitting: *Deleted

## 2016-08-22 DIAGNOSIS — M5442 Lumbago with sciatica, left side: Secondary | ICD-10-CM | POA: Diagnosis not present

## 2016-08-22 DIAGNOSIS — G8929 Other chronic pain: Secondary | ICD-10-CM | POA: Diagnosis not present

## 2016-08-22 NOTE — Telephone Encounter (Signed)
Spoke with pt's dtr and she states the pt was prescribed Ampicillin & has been prescribed Augmentin for 10 days and she started it on 08/19/16. Advised that the med will not interfere with Coumadin & she verbalized understanding.

## 2016-08-28 ENCOUNTER — Ambulatory Visit (INDEPENDENT_AMBULATORY_CARE_PROVIDER_SITE_OTHER): Payer: Medicare Other | Admitting: Pharmacist

## 2016-08-28 DIAGNOSIS — Z5181 Encounter for therapeutic drug level monitoring: Secondary | ICD-10-CM

## 2016-08-28 DIAGNOSIS — I480 Paroxysmal atrial fibrillation: Secondary | ICD-10-CM | POA: Diagnosis not present

## 2016-08-28 DIAGNOSIS — G8929 Other chronic pain: Secondary | ICD-10-CM | POA: Diagnosis not present

## 2016-08-28 DIAGNOSIS — M5442 Lumbago with sciatica, left side: Secondary | ICD-10-CM | POA: Diagnosis not present

## 2016-08-28 LAB — POCT INR: INR: 2.1

## 2016-09-04 DIAGNOSIS — M5442 Lumbago with sciatica, left side: Secondary | ICD-10-CM | POA: Diagnosis not present

## 2016-09-04 DIAGNOSIS — G8929 Other chronic pain: Secondary | ICD-10-CM | POA: Diagnosis not present

## 2016-09-24 ENCOUNTER — Other Ambulatory Visit: Payer: Self-pay | Admitting: Interventional Cardiology

## 2016-09-24 ENCOUNTER — Ambulatory Visit (INDEPENDENT_AMBULATORY_CARE_PROVIDER_SITE_OTHER): Payer: Medicare Other

## 2016-09-24 DIAGNOSIS — Z5181 Encounter for therapeutic drug level monitoring: Secondary | ICD-10-CM | POA: Diagnosis not present

## 2016-09-24 DIAGNOSIS — I480 Paroxysmal atrial fibrillation: Secondary | ICD-10-CM | POA: Diagnosis not present

## 2016-09-24 LAB — POCT INR: INR: 2.7

## 2016-10-08 DIAGNOSIS — N183 Chronic kidney disease, stage 3 (moderate): Secondary | ICD-10-CM | POA: Diagnosis not present

## 2016-10-08 DIAGNOSIS — K529 Noninfective gastroenteritis and colitis, unspecified: Secondary | ICD-10-CM | POA: Diagnosis not present

## 2016-10-08 DIAGNOSIS — Z23 Encounter for immunization: Secondary | ICD-10-CM | POA: Diagnosis not present

## 2016-10-08 DIAGNOSIS — E782 Mixed hyperlipidemia: Secondary | ICD-10-CM | POA: Diagnosis not present

## 2016-10-08 DIAGNOSIS — M5432 Sciatica, left side: Secondary | ICD-10-CM | POA: Diagnosis not present

## 2016-10-08 DIAGNOSIS — R42 Dizziness and giddiness: Secondary | ICD-10-CM | POA: Diagnosis not present

## 2016-10-08 DIAGNOSIS — R0981 Nasal congestion: Secondary | ICD-10-CM | POA: Diagnosis not present

## 2016-10-08 DIAGNOSIS — Z8719 Personal history of other diseases of the digestive system: Secondary | ICD-10-CM | POA: Diagnosis not present

## 2016-10-08 DIAGNOSIS — E039 Hypothyroidism, unspecified: Secondary | ICD-10-CM | POA: Diagnosis not present

## 2016-10-08 DIAGNOSIS — I509 Heart failure, unspecified: Secondary | ICD-10-CM | POA: Diagnosis not present

## 2016-10-08 DIAGNOSIS — I4891 Unspecified atrial fibrillation: Secondary | ICD-10-CM | POA: Diagnosis not present

## 2016-10-08 DIAGNOSIS — D649 Anemia, unspecified: Secondary | ICD-10-CM | POA: Diagnosis not present

## 2016-10-15 ENCOUNTER — Telehealth: Payer: Self-pay

## 2016-10-15 ENCOUNTER — Ambulatory Visit: Payer: Medicare Other | Admitting: Neurology

## 2016-10-15 NOTE — Telephone Encounter (Signed)
I called pt to r/s her appt today with Dr. Rexene Alberts. No answer, left a message asking pt to call me back.

## 2016-10-16 ENCOUNTER — Ambulatory Visit (INDEPENDENT_AMBULATORY_CARE_PROVIDER_SITE_OTHER): Payer: Medicare Other | Admitting: Podiatry

## 2016-10-16 DIAGNOSIS — B351 Tinea unguium: Secondary | ICD-10-CM | POA: Diagnosis not present

## 2016-10-16 DIAGNOSIS — M79676 Pain in unspecified toe(s): Secondary | ICD-10-CM

## 2016-10-16 NOTE — Telephone Encounter (Signed)
It appears that pt's daughter called back and pt's appt was rescheduled for 10/30/16.

## 2016-10-16 NOTE — Progress Notes (Signed)
Patient ID: [name], female   DOB: 30-May-1920, 83 y.o.   MRN: [id number] Complaint:  Visit Type: Patient returns to my office for continued preventative foot care services. Complaint: Patient states" my nails have grown long and thick and become painful to walk and wear shoes" . The patient presents for preventative foot care services. No changes to ROS  Podiatric Exam: Vascular: dorsalis pedis and posterior tibial pulses are palpable bilateral. Capillary return is immediate. Temperature gradient is WNL. Skin turgor WNL  Sensorium: Diminished  Semmes Weinstein monofilament test. Normal tactile sensation bilaterally. Nail Exam: Pt has thick disfigured discolored nails with subungual debris noted bilateral entire nail hallux through fifth toenails Ulcer Exam: There is no evidence of ulcer or pre-ulcerative changes or infection. Orthopedic Exam: Muscle tone and strength are WNL. No limitations in general ROM. No crepitus or effusions noted. Foot type and digits show no abnormalities. Bony prominences are unremarkable. Skin: No Porokeratosis. No infection or ulcers  Diagnosis:  Onychomycosis, , Pain in right toe, pain in left toes  Treatment & Plan Procedures and Treatment: Consent by patient was obtained for treatment procedures. The patient understood the discussion of treatment and procedures well. All questions were answered thoroughly reviewed. Debridement of mycotic and hypertrophic toenails, 1 through 5 bilateral and clearing of subungual debris. No ulceration, no infection noted.  Dispense heel pillow. Return Visit-Office Procedure: Patient instructed to return to the office for a follow up visit 3 months for continued evaluation and treatment.   Gardiner Barefoot DPM

## 2016-10-22 ENCOUNTER — Ambulatory Visit (INDEPENDENT_AMBULATORY_CARE_PROVIDER_SITE_OTHER): Payer: Medicare Other | Admitting: *Deleted

## 2016-10-22 DIAGNOSIS — I2782 Chronic pulmonary embolism: Secondary | ICD-10-CM

## 2016-10-22 DIAGNOSIS — I480 Paroxysmal atrial fibrillation: Secondary | ICD-10-CM | POA: Diagnosis not present

## 2016-10-22 DIAGNOSIS — Z5181 Encounter for therapeutic drug level monitoring: Secondary | ICD-10-CM

## 2016-10-22 LAB — POCT INR: INR: 3.1

## 2016-10-29 ENCOUNTER — Telehealth: Payer: Self-pay

## 2016-10-29 NOTE — Telephone Encounter (Signed)
I called Mardene Celeste, pt's daughter, per DPR, asked her to have pt come at 8:30am instead of 8:00am tomorrow. [name] is agreeable to this and verbalized understanding of new time.

## 2016-10-30 ENCOUNTER — Ambulatory Visit: Payer: Medicare Other | Admitting: Neurology

## 2016-10-30 ENCOUNTER — Ambulatory Visit: Payer: Self-pay | Admitting: Neurology

## 2016-10-30 ENCOUNTER — Ambulatory Visit (INDEPENDENT_AMBULATORY_CARE_PROVIDER_SITE_OTHER): Payer: Medicare Other | Admitting: Neurology

## 2016-10-30 ENCOUNTER — Encounter: Payer: Self-pay | Admitting: Neurology

## 2016-10-30 VITALS — BP 139/68 | HR 72 | Ht 65.0 in | Wt 143.0 lb

## 2016-10-30 DIAGNOSIS — R42 Dizziness and giddiness: Secondary | ICD-10-CM | POA: Diagnosis not present

## 2016-10-30 NOTE — Progress Notes (Signed)
Subjective:    Patient ID: [name] is a 83 y.o. female.  HPI     Interim history:   [name] is a 83 year old right-handed woman with a complex underlying history of atrial fibrillation, thyroid disease, giant cell arteritis, DVT with PE, hysterectomy, status post [place] filter placement, spinal stenosis with surgery in 2000, right knee replacement surgery in 2001, left knee replacement surgery in 2002, essential tremor, peripheral neuropathy, macular degeneration, and hearing loss, gait disorder, and he central tremor, who presents for a new problem of dizziness. The patient is accompanied by her daughter again today. I last saw her on 04/25/2016, at which time she was feeling stable, no recent fall, she was walking with her walker quite well. She was trying to hydrate well. I suggested as needed follow-up.  Today, 10/30/2016 (all dictated new, as well as above notes, some dictation done in note pad or Word, outside of chart, may appear as copied):  She reports dizzy spells when changing position quickly, especially when lying down. She had blood work in July per daughter, she has a 2 month history of funny feeling in the head, intermittent brief, also sharp pain in the scalp, brief, seconds, not sustained. Of note, her brain MRI from 04/26/2014 showed mild white matter changes, quite benign for age. She feels that her balance is worse as well. She has hearing loss. She sees an audiologist but has not seen ENT and 7 years. She denies spinning sensation or lightheadedness upon standing.   The patient's allergies, current medications, family history, past medical history, past social history, past surgical history and problem list were reviewed and updated as appropriate.    Previously (copied from previous notes for reference):   I saw her on 10/24/2015, at which time she reported doing okay. She had 2 interim ER visits which I reviewed: on 03/25/2015 secondary to abdominal pain and  was found to have partial small bowel obstruction, which was managed as an outpatient; 03/31/2015 for right hand swelling and had an x-ray of the right hand, 3 views, on 03/31/2015: IMPRESSION:  No evidence of fracture or dislocation. Mild degenerative change at the first carpometacarpal joint, and at the ulnar aspect of the carpal rows.   She was diagnosed with rheumatoid arthritis and first saw orthopedics, then rheumatology. She was placed on a steroid taper and then on maintenance prednisone 5 mg daily. She developed severe left hip pain which was gradually improving. She had no recent falls but a near fall. She felt that her tremor was slightly worse.    I saw her on 03/23/2015, at which time she reported more weakness in her lower extremities and lower back pain. She was trying to do exercises sitting down and was walking with her walker. She had cataract surgery on the right. She did have a recent fall and bumped her head. She had no loss of consciousness and no headache, she felt that her tremor was worse as well.    I saw her on 09/22/2014, at which time she reported that her headaches on the R side had resolved. She saw another dentist and was told to use a hollowed out pillow and was given a mouth piece for TMJ and this had helped. She had intermittent right arm pain and weakness and numbness and grip weakness. She had neck pain and saw her orthopedic doctor, had PT and felt better. I suggested she continue using her walker and drink water. I suggested gentle strengthening and range  of motion exercises for her upper body.   I saw her on 04/05/2014, at which time she reported a one month Hx of intermittent dull headache and some sharp shooting pains on the right side of her head. Symptoms were different from when she was diagnosed in the past with giant cell arteritis. Nevertheless, she saw her primary care physician who ordered a head CT. She had a head CT on 03/18/2014: Mild patchy periventricular  small vessel disease. No intracranial mass, hemorrhage, or acute appearing infarct. In addition, I personally reviewed the images through the PACS system. She also reported having had a sedimentation rate and CRP checked both of which were negative for concern for temporal arteritis. She saw her ophthalmologist who did not see any new problems explaining the situation. She had been diagnosed with wet macular degeneration and had injections into both eyes for treatment of this. Symptoms coincidentally started after she had her eye injection, about one week after she had her right I injected. She had her left eye injected a week after the right. She had no new one-sided neurological symptoms such as weakness, numbness, tingling, no facial droop, she did have some joint pain in her TMJ on the right. She saw her dentist. She said he was not concerned. She was advised not to chew on hard foods and try to eat softer foods. I suggested we proceed with a brain MRI contrast, which she had on 04/26/14: Mildly abnormal MRI brain (without) demonstrating: 1. Scattered periventricular and subcortical foci of non-specific gliosis, likely chronic small vessel ischemic disease. 2. No acute findings. In addition, I personally reviewed the images through the PACS system. We called her with her test results.   I saw her on 09/21/2013, at which time she reported having had a UTI. She had stayed with her daughter. She had not fallen. Nocturia became worse. She was complaining of leg pain. She presents for a sooner than scheduled appointment secondary to new onset jaw pain.   I saw her on 03/18/2013, at which time her daughter reported that the patient was hospitalized for about 10 days in January 2015 for small bowel obstruction and symptoms consisting of abdominal pain, nausea and vomiting. Her hospitalization was complicated by aspiration pneumonia, A. fib, pulmonary embolus, nausea, vomiting, microscopic colitis, leukocytosis,  CKD (chronic kidney disease), stage IV. She had home health therapies and has a emergency call button. She continues to live alone. She has no caregiver at home on a daily basis, but her kids check with her frequently. She has help with cleaning. She cooks at the stove and occasionally uses a knife. Her INR was stable. I talked to her and her daughter at length last time and I was particularly concerned about her continuing to live alone and working in the kitchen and at the stove because she is visually impaired and has had balance problems, gait disorder, abnormal posture and I advised her to have someone at the house for help on a day-to-day basis. She was advised to use her walker at all times. I did not suggest any new medications.   I first met her on 09/08/2012, at which time he felt she had remained fairly stable and I did not suggest any medication changes. My main concern was that she continued to live alone and I talked to her and her daughter about that. Because of her visual and hearing impairment, balance problems, and gait dysfunction and abnormal posture as well as her age I felt that  she needed to start a discussion with her family about an alternative living situation. I advised her to use her walker at all times.   She previously followed with Dr. Morene Antu and was last seen by Dr. Erling Cruz on 03/12/2012, at which time he felt that she was stable and that she was doing well living by herself. She no longer drives. She sees Dr. Hardin Negus for pain management. She has had bladder incontinence. She has a long-standing history of peripheral neuropathy. She has a 2 story home. 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 One daughter is in [place], one in [place] and [name] is local and her son lives at [place]. She has a life alert button.  Her Past Medical History Is Significant For: Past Medical History:  Diagnosis Date  . Blindness   . Blood clot associated with vein  wall inflammation   . CKD (chronic kidney disease), stage III ([place])   . Colitis   . Goiter    s/p radioactive iod. tx  . Hypothyroidism   . On amiodarone therapy   . Paroxysmal atrial fibrillation ([facility])   . Pulmonary embolus ([facility])    and DVT s/p IVC filter  . SBO (small bowel obstruction) ([place])   . Spinal stenosis of lumbar region with radiculopathy    possible neurogenic claudication recently?  . Temporal arteritis ([place])     Her Past Surgical History Is Significant For: Past Surgical History:  Procedure Laterality Date  . APPENDECTOMY  1940  . BACK SURGERY  04/1998   spinal stenosis  . BACK SURGERY  2000   Ruptured Disc  . BREAST BIOPSY     cysts in both breasts  . CARPAL TUNNEL RELEASE  2002  . ivc filter    . PARTIAL HYSTERECTOMY    . TONSILLECTOMY  1934  . TOTAL KNEE ARTHROPLASTY Left 04/1999  . TOTAL KNEE ARTHROPLASTY Right 02/2000    Her Family History Is Significant For: Family History  Problem Relation Age of Onset  . Breast cancer Sister   . Congestive Heart Failure Mother   . Stroke Father   . Crohn's disease Neg Hx   . Ulcerative colitis Neg Hx     Her Social History Is Significant For: Social History   Social History  . Marital status: Widowed    Spouse name: N/A  . Number of children: 4  . Years of education: College   Occupational History  .  Retired    retired   Social History Main Topics  . Smoking status: Never Smoker  . Smokeless tobacco: Never Used  . Alcohol use No  . Drug use: No  . Sexual activity: No   Other Topics Concern  . None   Social History Narrative   Patient lives at home alone.  Use walker to get around   Caffeine Use: rarely   Patient is right handed    Her Allergies Are:  Allergies  Allergen Reactions  . Cefdinir     Caused bad diarrhea  . Cortisone     Flushed hands and face   . Prednisone     cuased blood clots  . Sulfasalazine     Nausea side effects  . Sulfonamide Derivatives     Stomach pains  and cramping   :   Her Current Medications Are:  Outpatient Encounter Prescriptions as of 10/30/2016  Medication Sig  . amiodarone (PACERONE) 200 MG tablet Take 0.5 tablets (100 mg  total) by mouth daily.  . Calcium Carb-Cholecalciferol (CALCIUM 600 + D) 600-200 MG-UNIT TABS Take 1 tablet by mouth 3 (three) times daily.  Marland Kitchen levothyroxine (SYNTHROID, LEVOTHROID) 150 MCG tablet Take 150 mcg by mouth daily before breakfast.  . Misc Natural Products (GLUCOSAMINE CHONDROITIN COMPLX) TABS Take 1 tablet by mouth 2 (two) times daily.   . Multiple Vitamins-Minerals (OCUVITE ADULT 50+ PO) Take 1 tablet by mouth daily.  . prednisoLONE 5 MG TABS tablet Take 5 mg by mouth daily.   Marland Kitchen warfarin (COUMADIN) 2.5 MG tablet Take 1 tablet by mouth daily or as directed by coumadin clinic   No facility-administered encounter medications on file as of 10/30/2016.   :  Review of Systems:  Out of a complete 14 point review of systems, all are reviewed and negative with the exception of these symptoms as listed below: Review of Systems  Neurological:       Pt presents today to discuss a strange feeling that she has in her head almost daily. When she goes to lie down, she has a "strange" feeling but pt denies that it is dizziness. She also has occasional shooting pains in her head.    Objective:  Neurological Exam  Physical Exam Physical Examination:   Vitals:   10/30/16 1523  BP: 139/68  Pulse: 72   General Examination: The patient is a very pleasant 83 y.o. female in no acute distress. She appears well-developed and well-nourished and well groomed.   HEENT: Normocephalic, atraumatic, pupils are equal, round and reactive to light, She is hearing impaired, she is visually impaired. Oropharynx examination reveals Moderate voice tremor. She has a more prominent head and jaw tremor, worse from before.   Chest: Clear to auscultation without wheezing, rhonchi or crackles noted.  Heart: S1+S2+0, regular and  normal without murmurs, rubs or gallops noted.   Abdomen: Soft, non-tender and non-distended with normal bowel sounds appreciated on auscultation.  Extremities: There is no pitting edema in the distal lower extremities bilaterally. Pedal pulses are intact.  Skin: Warm and dry without trophic changes noted.  Musculoskeletal: exam reveals no obvious joint deformities, tenderness or joint swelling or erythema. Mild puffiness noted right ankle.  Neurologically:  Mental status: The patient is awake, alert and oriented in all 4 spheres. Her immediate and remote memory, attention, language skills and fund of knowledge are appropriate for age. Thought process is linear. Mood is normal and affect is normal.  Cranial nerves II - XII are as described above under HEENT exam. In addition: shoulder shrug is normal with equal shoulder height noted. Motor exam: Thin bulk, global strength of 4 out of 5, normal tone. Reflexes are 1+. Fine motor skills are mildly impaired globally. She has no significant resting tremor, minimal bilateral upper extremity postural and action tremor. She stands with difficulty, she walks with her 4 wheeled walker.  Romberg is not tested for safety. She is visually impaired. Sensory exam is intact to light touch but she reports feeling of numbness in the soles of her feet.   Assessment and Plan:   In summary, MICHELYN SCULLIN is a very pleasant 83 year old female with a complex medical history of heart disease including paroxysmal A. fib, on amiodarone therapy, history of pulmonary embolus, on Coumadin therapy, history of peripheral neuropathy, degenerative spine disease, blindness, hearing loss, history of vertigo, essential tremor, and gait disorder who presents for evaluation of an intermittent brief sense of dizziness and funny feeling in her head especially when her tremor  is more pronounced than when she is lying down or changing position. She does not report a spinning  sensation. She had a brain MRI in April 2016 with benign results. Her exam is stable with the exception of more noticeable head tremor. Unfortunately, we are limited in our treatment options for her given her age and complex medical history and concern for side effects. She continues to live alone. She is advised to consider coming back for a repeat brain MRI for reassurance more than anything else. She is also advised to consider seeing ENT again. We will call her with her MRI results. We will schedule with the help of her daughter. I answered all their questions today and the patient and her daughter were in agreement. I suggested as needed follow-up.  I spent 25 minutes in total face-to-face time with the patient, more than 50% of which was spent in counseling and coordination of care, reviewing test results, reviewing medication and discussing or reviewing the diagnosis of dizziness, its prognosis and treatment options. Pertinent laboratory and imaging test results that were available during this visit with the patient were reviewed by me and considered in my medical decision making (see chart for details).

## 2016-10-30 NOTE — Patient Instructions (Addendum)
Your exam is stable, with the exception of your voice and head tremor a little worse, which may contribute to your funny feeling in the head. You may benefit from seeing ENT again.  I am not sure, what else to suggest, we will try to get you in for another MRI brain for reassurance.  We will call you with the results.

## 2016-10-31 ENCOUNTER — Telehealth: Payer: Self-pay | Admitting: Neurology

## 2016-10-31 NOTE — Telephone Encounter (Signed)
Patient daughter called in stating that Dr. Janeice Robinson office needs Dr. Rexene Alberts notes from yesterday visit in order to schedule her ENT appointment. She said that if the information isn't sent then they would have to go through her PCP.

## 2016-11-01 NOTE — Telephone Encounter (Signed)
I Don't have a  Referral on this patient .

## 2016-11-01 NOTE — Telephone Encounter (Signed)
Faxed Records to Dr. Flonnie Overman [facility]. [phone]

## 2016-11-06 DIAGNOSIS — M25552 Pain in left hip: Secondary | ICD-10-CM | POA: Diagnosis not present

## 2016-11-06 DIAGNOSIS — M25551 Pain in right hip: Secondary | ICD-10-CM | POA: Diagnosis not present

## 2016-11-06 DIAGNOSIS — M25512 Pain in left shoulder: Secondary | ICD-10-CM | POA: Diagnosis not present

## 2016-11-09 DIAGNOSIS — M25551 Pain in right hip: Secondary | ICD-10-CM | POA: Diagnosis not present

## 2016-11-11 ENCOUNTER — Other Ambulatory Visit: Payer: Medicare Other

## 2016-11-12 ENCOUNTER — Ambulatory Visit (INDEPENDENT_AMBULATORY_CARE_PROVIDER_SITE_OTHER): Payer: Medicare Other | Admitting: *Deleted

## 2016-11-12 DIAGNOSIS — H353232 Exudative age-related macular degeneration, bilateral, with inactive choroidal neovascularization: Secondary | ICD-10-CM | POA: Diagnosis not present

## 2016-11-12 DIAGNOSIS — I480 Paroxysmal atrial fibrillation: Secondary | ICD-10-CM

## 2016-11-12 DIAGNOSIS — Z5181 Encounter for therapeutic drug level monitoring: Secondary | ICD-10-CM

## 2016-11-12 DIAGNOSIS — M25512 Pain in left shoulder: Secondary | ICD-10-CM | POA: Diagnosis not present

## 2016-11-12 DIAGNOSIS — H43813 Vitreous degeneration, bilateral: Secondary | ICD-10-CM | POA: Diagnosis not present

## 2016-11-12 DIAGNOSIS — M25551 Pain in right hip: Secondary | ICD-10-CM | POA: Diagnosis not present

## 2016-11-12 DIAGNOSIS — M25552 Pain in left hip: Secondary | ICD-10-CM | POA: Diagnosis not present

## 2016-11-12 LAB — POCT INR: INR: 3

## 2016-11-14 DIAGNOSIS — M25552 Pain in left hip: Secondary | ICD-10-CM | POA: Diagnosis not present

## 2016-11-14 DIAGNOSIS — M79622 Pain in left upper arm: Secondary | ICD-10-CM | POA: Diagnosis not present

## 2016-11-18 DIAGNOSIS — H9113 Presbycusis, bilateral: Secondary | ICD-10-CM | POA: Diagnosis not present

## 2016-11-18 DIAGNOSIS — J300 Vasomotor rhinitis: Secondary | ICD-10-CM | POA: Diagnosis not present

## 2016-11-18 DIAGNOSIS — M26629 Arthralgia of temporomandibular joint, unspecified side: Secondary | ICD-10-CM | POA: Diagnosis not present

## 2016-11-18 DIAGNOSIS — R2689 Other abnormalities of gait and mobility: Secondary | ICD-10-CM | POA: Diagnosis not present

## 2016-11-20 ENCOUNTER — Ambulatory Visit
Admission: RE | Admit: 2016-11-20 | Discharge: 2016-11-20 | Disposition: A | Discharge status: Home / Self Care | Payer: Medicare Other | Source: Ambulatory Visit | Attending: Neurology | Admitting: Neurology

## 2016-11-20 DIAGNOSIS — M79622 Pain in left upper arm: Secondary | ICD-10-CM | POA: Diagnosis not present

## 2016-11-20 DIAGNOSIS — R42 Dizziness and giddiness: Secondary | ICD-10-CM | POA: Diagnosis not present

## 2016-11-20 DIAGNOSIS — M25552 Pain in left hip: Secondary | ICD-10-CM | POA: Diagnosis not present

## 2016-11-21 ENCOUNTER — Inpatient Hospital Stay (HOSPITAL_COMMUNITY): Payer: Medicare Other

## 2016-11-21 ENCOUNTER — Emergency Department (HOSPITAL_COMMUNITY): Payer: Medicare Other

## 2016-11-21 ENCOUNTER — Encounter (HOSPITAL_COMMUNITY): Payer: Self-pay | Admitting: Nurse Practitioner

## 2016-11-21 ENCOUNTER — Inpatient Hospital Stay (HOSPITAL_COMMUNITY)
Admission: EM | Admit: 2016-11-21 | Discharge: 2016-11-25 | DRG: 388 | Disposition: A | Discharge status: Home Health | Payer: Medicare Other | Attending: Internal Medicine | Admitting: Internal Medicine

## 2016-11-21 ENCOUNTER — Other Ambulatory Visit: Payer: Self-pay

## 2016-11-21 DIAGNOSIS — R918 Other nonspecific abnormal finding of lung field: Secondary | ICD-10-CM | POA: Diagnosis not present

## 2016-11-21 DIAGNOSIS — Z66 Do not resuscitate: Secondary | ICD-10-CM | POA: Diagnosis present

## 2016-11-21 DIAGNOSIS — D649 Anemia, unspecified: Secondary | ICD-10-CM | POA: Diagnosis present

## 2016-11-21 DIAGNOSIS — N179 Acute kidney failure, unspecified: Secondary | ICD-10-CM | POA: Diagnosis present

## 2016-11-21 DIAGNOSIS — Z86711 Personal history of pulmonary embolism: Secondary | ICD-10-CM | POA: Diagnosis not present

## 2016-11-21 DIAGNOSIS — Z09 Encounter for follow-up examination after completed treatment for conditions other than malignant neoplasm: Secondary | ICD-10-CM

## 2016-11-21 DIAGNOSIS — Z96653 Presence of artificial knee joint, bilateral: Secondary | ICD-10-CM | POA: Diagnosis present

## 2016-11-21 DIAGNOSIS — R791 Abnormal coagulation profile: Secondary | ICD-10-CM | POA: Diagnosis present

## 2016-11-21 DIAGNOSIS — E876 Hypokalemia: Secondary | ICD-10-CM | POA: Diagnosis present

## 2016-11-21 DIAGNOSIS — Z0189 Encounter for other specified special examinations: Secondary | ICD-10-CM | POA: Diagnosis not present

## 2016-11-21 DIAGNOSIS — N183 Chronic kidney disease, stage 3 (moderate): Secondary | ICD-10-CM | POA: Diagnosis present

## 2016-11-21 DIAGNOSIS — E039 Hypothyroidism, unspecified: Secondary | ICD-10-CM | POA: Diagnosis present

## 2016-11-21 DIAGNOSIS — K56609 Unspecified intestinal obstruction, unspecified as to partial versus complete obstruction: Secondary | ICD-10-CM

## 2016-11-21 DIAGNOSIS — R0602 Shortness of breath: Secondary | ICD-10-CM

## 2016-11-21 DIAGNOSIS — Z823 Family history of stroke: Secondary | ICD-10-CM | POA: Diagnosis not present

## 2016-11-21 DIAGNOSIS — H3530 Unspecified macular degeneration: Secondary | ICD-10-CM | POA: Diagnosis present

## 2016-11-21 DIAGNOSIS — Z8249 Family history of ischemic heart disease and other diseases of the circulatory system: Secondary | ICD-10-CM | POA: Diagnosis not present

## 2016-11-21 DIAGNOSIS — R109 Unspecified abdominal pain: Secondary | ICD-10-CM | POA: Diagnosis not present

## 2016-11-21 DIAGNOSIS — R748 Abnormal levels of other serum enzymes: Secondary | ICD-10-CM | POA: Diagnosis present

## 2016-11-21 DIAGNOSIS — K56600 Partial intestinal obstruction, unspecified as to cause: Secondary | ICD-10-CM | POA: Diagnosis present

## 2016-11-21 DIAGNOSIS — D72829 Elevated white blood cell count, unspecified: Secondary | ICD-10-CM | POA: Diagnosis not present

## 2016-11-21 DIAGNOSIS — E860 Dehydration: Secondary | ICD-10-CM | POA: Diagnosis present

## 2016-11-21 DIAGNOSIS — H547 Unspecified visual loss: Secondary | ICD-10-CM | POA: Diagnosis present

## 2016-11-21 DIAGNOSIS — N184 Chronic kidney disease, stage 4 (severe): Secondary | ICD-10-CM | POA: Diagnosis present

## 2016-11-21 DIAGNOSIS — I480 Paroxysmal atrial fibrillation: Secondary | ICD-10-CM | POA: Diagnosis present

## 2016-11-21 DIAGNOSIS — I340 Nonrheumatic mitral (valve) insufficiency: Secondary | ICD-10-CM | POA: Diagnosis not present

## 2016-11-21 DIAGNOSIS — D72828 Other elevated white blood cell count: Secondary | ICD-10-CM

## 2016-11-21 DIAGNOSIS — R112 Nausea with vomiting, unspecified: Secondary | ICD-10-CM | POA: Diagnosis not present

## 2016-11-21 DIAGNOSIS — J690 Pneumonitis due to inhalation of food and vomit: Secondary | ICD-10-CM | POA: Diagnosis present

## 2016-11-21 DIAGNOSIS — Z4682 Encounter for fitting and adjustment of non-vascular catheter: Secondary | ICD-10-CM | POA: Diagnosis not present

## 2016-11-21 DIAGNOSIS — M129 Arthropathy, unspecified: Secondary | ICD-10-CM | POA: Diagnosis present

## 2016-11-21 DIAGNOSIS — M7989 Other specified soft tissue disorders: Secondary | ICD-10-CM

## 2016-11-21 DIAGNOSIS — I5032 Chronic diastolic (congestive) heart failure: Secondary | ICD-10-CM | POA: Diagnosis present

## 2016-11-21 DIAGNOSIS — Z7901 Long term (current) use of anticoagulants: Secondary | ICD-10-CM | POA: Diagnosis not present

## 2016-11-21 DIAGNOSIS — I130 Hypertensive heart and chronic kidney disease with heart failure and stage 1 through stage 4 chronic kidney disease, or unspecified chronic kidney disease: Secondary | ICD-10-CM | POA: Diagnosis present

## 2016-11-21 DIAGNOSIS — R079 Chest pain, unspecified: Secondary | ICD-10-CM

## 2016-11-21 DIAGNOSIS — Z803 Family history of malignant neoplasm of breast: Secondary | ICD-10-CM

## 2016-11-21 DIAGNOSIS — K2970 Gastritis, unspecified, without bleeding: Secondary | ICD-10-CM | POA: Diagnosis not present

## 2016-11-21 DIAGNOSIS — R740 Nonspecific elevation of levels of transaminase and lactic acid dehydrogenase [LDH]: Secondary | ICD-10-CM | POA: Diagnosis present

## 2016-11-21 LAB — CBC
HCT: 41.9 % (ref 36.0–46.0)
HEMOGLOBIN: 13.9 g/dL (ref 12.0–15.0)
MCH: 31.4 pg (ref 26.0–34.0)
MCHC: 33.2 g/dL (ref 30.0–36.0)
MCV: 94.8 fL (ref 78.0–100.0)
Platelets: 235 10*3/uL (ref 150–400)
RBC: 4.42 MIL/uL (ref 3.87–5.11)
RDW: 15.2 % (ref 11.5–15.5)
WBC: 21.3 10*3/uL — ABNORMAL HIGH (ref 4.0–10.5)

## 2016-11-21 LAB — TROPONIN I
TROPONIN I: 0.04 ng/mL — AB (ref <0.03)
Troponin I: 0.04 ng/mL (ref <0.03)
Troponin I: 0.04 ng/mL (ref <0.03)

## 2016-11-21 LAB — COMPREHENSIVE METABOLIC PANEL
ALT: 18 U/L (ref 14–54)
ANION GAP: 11 (ref 5–15)
AST: 31 U/L (ref 15–41)
Albumin: 4.3 g/dL (ref 3.5–5.0)
Alkaline Phosphatase: 71 U/L (ref 38–126)
BUN: 38 mg/dL — ABNORMAL HIGH (ref 6–20)
CHLORIDE: 105 mmol/L (ref 101–111)
CO2: 26 mmol/L (ref 22–32)
CREATININE: 1.35 mg/dL — AB (ref 0.44–1.00)
Calcium: 9.3 mg/dL (ref 8.9–10.3)
GFR calc non Af Amer: 32 mL/min — ABNORMAL LOW (ref >60)
GFR, EST AFRICAN AMERICAN: 38 mL/min — AB (ref >60)
Glucose, Bld: 151 mg/dL — ABNORMAL HIGH (ref 65–99)
Potassium: 4 mmol/L (ref 3.5–5.1)
SODIUM: 142 mmol/L (ref 135–145)
Total Bilirubin: 1 mg/dL (ref 0.3–1.2)
Total Protein: 6.5 g/dL (ref 6.5–8.1)

## 2016-11-21 LAB — LACTIC ACID, PLASMA
Lactic Acid, Venous: 2.8 mmol/L (ref 0.5–1.9)
Lactic Acid, Venous: 2 mmol/L (ref 0.5–1.9)

## 2016-11-21 LAB — URINALYSIS, ROUTINE W REFLEX MICROSCOPIC
Bilirubin Urine: NEGATIVE
Glucose, UA: NEGATIVE mg/dL
Hgb urine dipstick: NEGATIVE
KETONES UR: NEGATIVE mg/dL
Leukocytes, UA: NEGATIVE
Nitrite: POSITIVE — AB
PROTEIN: NEGATIVE mg/dL
Specific Gravity, Urine: 1.043 — ABNORMAL HIGH (ref 1.005–1.030)
pH: 7 (ref 5.0–8.0)

## 2016-11-21 LAB — MAGNESIUM: MAGNESIUM: 1.9 mg/dL (ref 1.7–2.4)

## 2016-11-21 LAB — LIPASE, BLOOD: LIPASE: 21 U/L (ref 11–51)

## 2016-11-21 LAB — I-STAT CG4 LACTIC ACID, ED
LACTIC ACID, VENOUS: 2.44 mmol/L — AB (ref 0.5–1.9)
Lactic Acid, Venous: 3.01 mmol/L (ref 0.5–1.9)

## 2016-11-21 LAB — PROTIME-INR
INR: 2.5
Prothrombin Time: 26.8 seconds — ABNORMAL HIGH (ref 11.4–15.2)

## 2016-11-21 MED ORDER — DIPHENHYDRAMINE HCL 50 MG/ML IJ SOLN: 12.5000 mg | Freq: Four times a day (QID) | INTRAMUSCULAR | Status: DC | PRN

## 2016-11-21 MED ORDER — MAGIC MOUTHWASH
15.0000 mL | Freq: Four times a day (QID) | ORAL | Status: DC | PRN
  Filled 2016-11-21: qty 15

## 2016-11-21 MED ORDER — ONDANSETRON HCL 4 MG/2ML IJ SOLN: 4.0000 mg | Freq: Four times a day (QID) | INTRAMUSCULAR | Status: DC | PRN

## 2016-11-21 MED ORDER — CHLORPROMAZINE HCL 25 MG/ML IJ SOLN
25.0000 mg | Freq: Four times a day (QID) | INTRAMUSCULAR | Status: DC | PRN
  Filled 2016-11-21: qty 1

## 2016-11-21 MED ORDER — LACTATED RINGERS IV BOLUS (SEPSIS)
1000.0000 mL | Freq: Once | INTRAVENOUS | Status: AC
  Administered 2016-11-21: 1000 mL via INTRAVENOUS

## 2016-11-21 MED ORDER — LABETALOL HCL 5 MG/ML IV SOLN
5.0000 mg | INTRAVENOUS | Status: DC | PRN
  Filled 2016-11-21: qty 4

## 2016-11-21 MED ORDER — SODIUM CHLORIDE 0.9 % IV SOLN
INTRAVENOUS | Status: DC
  Administered 2016-11-21 – 2016-11-22 (×2): via INTRAVENOUS

## 2016-11-21 MED ORDER — PROMETHAZINE HCL 25 MG/ML IJ SOLN
12.5000 mg | Freq: Four times a day (QID) | INTRAMUSCULAR | Status: DC | PRN
  Administered 2016-11-22: 12.5 mg via INTRAVENOUS
  Filled 2016-11-21: qty 1

## 2016-11-21 MED ORDER — IOPAMIDOL (ISOVUE-300) INJECTION 61%
INTRAVENOUS | Status: AC
  Filled 2016-11-21: qty 100

## 2016-11-21 MED ORDER — PIPERACILLIN-TAZOBACTAM 3.375 G IVPB 30 MIN
3.3750 g | Freq: Once | INTRAVENOUS | Status: AC
  Administered 2016-11-21: 3.375 g via INTRAVENOUS
  Filled 2016-11-21: qty 50

## 2016-11-21 MED ORDER — PIPERACILLIN-TAZOBACTAM 3.375 G IVPB
3.3750 g | Freq: Three times a day (TID) | INTRAVENOUS | Status: DC
  Administered 2016-11-21 – 2016-11-24 (×9): 3.375 g via INTRAVENOUS
  Filled 2016-11-21 (×10): qty 50

## 2016-11-21 MED ORDER — HEPARIN SODIUM (PORCINE) 5000 UNIT/ML IJ SOLN: 5000.0000 [IU] | Freq: Three times a day (TID) | INTRAMUSCULAR | Status: DC

## 2016-11-21 MED ORDER — LEVOTHYROXINE SODIUM 100 MCG IV SOLR
75.0000 ug | Freq: Every day | INTRAVENOUS | Status: DC
  Administered 2016-11-21 – 2016-11-24 (×4): 75 ug via INTRAVENOUS
  Filled 2016-11-21 (×4): qty 5

## 2016-11-21 MED ORDER — SODIUM CHLORIDE 0.9 % IV SOLN
Freq: Once | INTRAVENOUS | Status: AC
  Administered 2016-11-21: 08:00:00 via INTRAVENOUS

## 2016-11-21 MED ORDER — MORPHINE SULFATE (PF) 2 MG/ML IV SOLN: 1.0000 mg | INTRAVENOUS | Status: DC | PRN

## 2016-11-21 MED ORDER — FENTANYL CITRATE (PF) 100 MCG/2ML IJ SOLN
50.0000 ug | Freq: Once | INTRAMUSCULAR | Status: AC
  Administered 2016-11-21: 50 ug via INTRAVENOUS
  Filled 2016-11-21: qty 2

## 2016-11-21 MED ORDER — HYDROCORTISONE 1 % EX CREA: 1.0000 "application " | TOPICAL_CREAM | Freq: Three times a day (TID) | CUTANEOUS | Status: DC | PRN

## 2016-11-21 MED ORDER — IOPAMIDOL (ISOVUE-300) INJECTION 61%
100.0000 mL | Freq: Once | INTRAVENOUS | Status: AC | PRN
  Administered 2016-11-21: 75 mL via INTRAVENOUS

## 2016-11-21 MED ORDER — PHENOL 1.4 % MT LIQD: 1.0000 | OROMUCOSAL | Status: DC | PRN

## 2016-11-21 MED ORDER — GUAIFENESIN-DM 100-10 MG/5ML PO SYRP
10.0000 mL | ORAL_SOLUTION | ORAL | Status: DC | PRN
  Administered 2016-11-25: 10 mL via ORAL
  Filled 2016-11-21: qty 10

## 2016-11-21 MED ORDER — ONDANSETRON HCL 4 MG/2ML IJ SOLN
4.0000 mg | Freq: Once | INTRAMUSCULAR | Status: AC
Start: 2016-11-21 — End: 2016-11-21
  Administered 2016-11-21: 4 mg via INTRAVENOUS
  Filled 2016-11-21: qty 2

## 2016-11-21 MED ORDER — MENTHOL 3 MG MT LOZG
1.0000 | LOZENGE | OROMUCOSAL | Status: DC | PRN
Start: 2016-11-21 — End: 2016-11-25

## 2016-11-21 MED ORDER — ONDANSETRON HCL 4 MG/2ML IJ SOLN
4.0000 mg | Freq: Four times a day (QID) | INTRAMUSCULAR | Status: DC | PRN
  Administered 2016-11-21 – 2016-11-22 (×2): 4 mg via INTRAVENOUS
  Filled 2016-11-21 (×2): qty 2

## 2016-11-21 MED ORDER — LIP MEDEX EX OINT
1.0000 "application " | TOPICAL_OINTMENT | Freq: Two times a day (BID) | CUTANEOUS | Status: DC
  Administered 2016-11-21 – 2016-11-24 (×5): 1 via TOPICAL
  Filled 2016-11-21: qty 7

## 2016-11-21 MED ORDER — DIATRIZOATE MEGLUMINE & SODIUM 66-10 % PO SOLN
90.0000 mL | Freq: Once | ORAL | Status: AC
  Administered 2016-11-21: 90 mL via NASOGASTRIC
  Filled 2016-11-21: qty 90

## 2016-11-21 MED ORDER — ACETAMINOPHEN 650 MG RE SUPP
650.0000 mg | Freq: Four times a day (QID) | RECTAL | Status: DC | PRN
  Administered 2016-11-21: 650 mg via RECTAL
  Filled 2016-11-21: qty 1

## 2016-11-21 MED ORDER — METOPROLOL TARTRATE 5 MG/5ML IV SOLN
5.0000 mg | Freq: Four times a day (QID) | INTRAVENOUS | Status: DC
  Administered 2016-11-22 – 2016-11-25 (×15): 5 mg via INTRAVENOUS
  Filled 2016-11-21 (×14): qty 5

## 2016-11-21 MED ORDER — SODIUM CHLORIDE 0.9 % IV SOLN
8.0000 mg | Freq: Four times a day (QID) | INTRAVENOUS | Status: DC | PRN
  Filled 2016-11-21: qty 4

## 2016-11-21 MED ORDER — SODIUM CHLORIDE 0.9 % IV BOLUS (SEPSIS)
1000.0000 mL | Freq: Once | INTRAVENOUS | Status: AC
  Administered 2016-11-21: 1000 mL via INTRAVENOUS

## 2016-11-21 MED ORDER — HYDROCORTISONE 2.5 % RE CREA
1.0000 "application " | TOPICAL_CREAM | Freq: Four times a day (QID) | RECTAL | Status: DC | PRN
  Filled 2016-11-21 (×2): qty 28.35

## 2016-11-21 MED ORDER — ALUM & MAG HYDROXIDE-SIMETH 200-200-20 MG/5ML PO SUSP: 30.0000 mL | Freq: Four times a day (QID) | ORAL | Status: DC | PRN

## 2016-11-21 NOTE — ED Notes (Signed)
Bed: WA21 Expected date:  Expected time:  Means of arrival:  Comments: 61 f n/v

## 2016-11-21 NOTE — Progress Notes (Signed)
CRITICAL VALUE ALERT  Critical Value:  Lactic acid 2.0  Date & Time Notied:  11/21/16  1830  Provider Notified: Dr Wyline Copas

## 2016-11-21 NOTE — ED Notes (Signed)
Date and time results received: 11/21/16 2:15 PM   Test: Lactic Acid Critical Value: 2.8  Name of Provider Notified: Wyline Copas, MD paged   Orders Received? Or Actions Taken?: awaiting response

## 2016-11-21 NOTE — Progress Notes (Signed)
Received patient from ED, NGT placed to sx, VS obtained, telemetry applied, oriented to unit, call light placed in reach

## 2016-11-21 NOTE — Consult Note (Signed)
[facility] Surgery Consult Note  [name] [birth year]  [id number].    Requesting MD: Ellender Hose Chief Complaint/Reason for Consult: SBO HPI:  Patient is a 83 y.o. Female who presented to [facility] this AM after sudden onset sharp abdominal pain late last night. Patient states the pain is in her upper abdomen on the left and feels similar to when she has had bowel obstructions in the past. She has never had surgery for a bowel obstruction and would like to avoid surgery if possible. She also experienced some nausea and vomited 8-10 times. No blood or coffee ground appearance in emesis. Last BM was yesterday morning, and she normally goes before and after breakfast every morning. Has not passed any flatus since onset of pain. Denies fever, chills, urinary symptoms.  Patient has severe macular degeneration and can't see well but daughters have not noted any blood in stools. Patient also reports some mild chest tightness, but states she feels like it is more peripheral and may be related to vomiting. Patient is on coumadin for paroxysmal A. Fib and has an IVC Filter for hx of PE. Past abdominal surgeries include appendectomy in 1940 and abdominal hysterectomy about 15-20 yrs ago. Denies alcohol, tobacco, or illicit drug use.   PMH significant for PE, AFib, CKD, chronic diastolic CHF, hypothyroidism.  ROS: Review of Systems  Constitutional: Negative for chills and fever.  Respiratory: Negative for cough and shortness of breath.   Cardiovascular: Positive for chest pain. Negative for palpitations.  Gastrointestinal: Positive for abdominal pain, constipation, nausea and vomiting. Negative for blood in stool, diarrhea and heartburn.  Genitourinary: Negative for dysuria.  Neurological: Positive for dizziness.  All other systems reviewed and are negative.   Family History  Problem Relation Age of Onset  . Breast cancer Sister   . Congestive Heart Failure Mother   . Stroke Father   . Crohn's  disease Neg Hx   . Ulcerative colitis Neg Hx     Past Medical History:  Diagnosis Date  . Blindness   . Blood clot associated with vein wall inflammation   . CKD (chronic kidney disease), stage III ([place])   . Colitis   . Goiter    s/p radioactive iod. tx  . Hypothyroidism   . On amiodarone therapy   . Paroxysmal atrial fibrillation (HCC)   . Pulmonary embolus (HCC)    and DVT s/p IVC filter  . SBO (small bowel obstruction) ([place])   . Spinal stenosis of lumbar region with radiculopathy    possible neurogenic claudication recently?  . Temporal arteritis [facility])     Past Surgical History:  Procedure Laterality Date  . APPENDECTOMY  1940  . BACK SURGERY  04/1998   spinal stenosis  . BACK SURGERY  2000   Ruptured Disc  . BREAST BIOPSY     cysts in both breasts  . CARPAL TUNNEL RELEASE  2002  . ivc filter    . PARTIAL HYSTERECTOMY    . TONSILLECTOMY  1934  . TOTAL KNEE ARTHROPLASTY Left 04/1999  . TOTAL KNEE ARTHROPLASTY Right 02/2000    Social History:  reports that  has never smoked. she has never used smokeless tobacco. She reports that she does not drink alcohol or use drugs.  Allergies:  Allergies  Allergen Reactions  . Cefdinir     Caused bad diarrhea  . Cortisone     Flushed hands and face   . Prednisone     cuased blood clots Can have very small dose  .  Sulfasalazine     Nausea side effects  . Sulfonamide Derivatives     Stomach pains and cramping      (Not in a hospital admission)  Blood pressure (!) 154/58, pulse 72, temperature 98.4 F (36.9 C), temperature source Rectal, resp. rate (!) 22, SpO2 94 %. Physical Exam: Physical Exam  Constitutional: She is oriented to person, place, and time. She appears well-developed. She appears cachectic. She is cooperative.  Non-toxic appearance. No distress. Nasal cannula in place.  HENT:  Head: Normocephalic and atraumatic.  Right Ear: External ear normal.  Left Ear: External ear normal.  Nose: Nose normal.   Mouth/Throat: Mucous membranes are dry.  NGT in place  Eyes: Conjunctivae and lids are normal. No scleral icterus.  Pupils equal and round  Neck: Phonation normal. Neck supple.  Cardiovascular: Normal rate and regular rhythm.  Pulses:      Radial pulses are 2+ on the right side, and 2+ on the left side.       Dorsalis pedis pulses are 2+ on the right side, and 2+ on the left side.  Pulmonary/Chest: Effort normal and breath sounds normal.  Abdominal: Soft. Normal appearance. She exhibits distension (mild). Bowel sounds are decreased. There is no hepatosplenomegaly. There is tenderness in the epigastric area and left upper quadrant. There is no rigidity, no rebound and no guarding. No hernia.  Musculoskeletal:  No gross deformities in BL upper and lower extremities.   Neurological: She is alert and oriented to person, place, and time.  Skin: Skin is warm, dry and intact. No rash noted. She is not diaphoretic.  Psychiatric: She has a normal mood and affect. Her speech is normal and behavior is normal.    Results for orders placed or performed during the hospital encounter of 11/21/16 (from the past 48 hour(s))  Lipase, blood     Status: None   Collection Time: 11/21/16  6:14 AM  Result Value Ref Range   Lipase 21 11 - 51 U/L  Comprehensive metabolic panel     Status: Abnormal   Collection Time: 11/21/16  6:14 AM  Result Value Ref Range   Sodium 142 135 - 145 mmol/L   Potassium 4.0 3.5 - 5.1 mmol/L   Chloride 105 101 - 111 mmol/L   CO2 26 22 - 32 mmol/L   Glucose, Bld 151 (H) 65 - 99 mg/dL   BUN 38 (H) 6 - 20 mg/dL   Creatinine, Ser 1.35 (H) 0.44 - 1.00 mg/dL   Calcium 9.3 8.9 - 10.3 mg/dL   Total Protein 6.5 6.5 - 8.1 g/dL   Albumin 4.3 3.5 - 5.0 g/dL   AST 31 15 - 41 U/L   ALT 18 14 - 54 U/L   Alkaline Phosphatase 71 38 - 126 U/L   Total Bilirubin 1.0 0.3 - 1.2 mg/dL   GFR calc non Af Amer 32 (L) >60 mL/min   GFR calc Af Amer 38 (L) >60 mL/min    Comment: (NOTE) The eGFR  has been calculated using the CKD EPI equation. This calculation has not been validated in all clinical situations. eGFR's persistently <60 mL/min signify possible Chronic Kidney Disease.    Anion gap 11 5 - 15  CBC     Status: Abnormal   Collection Time: 11/21/16  6:14 AM  Result Value Ref Range   WBC 21.3 (H) 4.0 - 10.5 K/uL   RBC 4.42 3.87 - 5.11 MIL/uL   Hemoglobin 13.9 12.0 - 15.0 g/dL   HCT 41.9  36.0 - 46.0 %   MCV 94.8 78.0 - 100.0 fL   MCH 31.4 26.0 - 34.0 pg   MCHC 33.2 30.0 - 36.0 g/dL   RDW 15.2 11.5 - 15.5 %   Platelets 235 150 - 400 K/uL  I-Stat CG4 Lactic Acid, ED     Status: Abnormal   Collection Time: 11/21/16  7:50 AM  Result Value Ref Range   Lactic Acid, Venous 3.01 (HH) 0.5 - 1.9 mmol/L   Comment NOTIFIED PHYSICIAN   Protime-INR     Status: Abnormal   Collection Time: 11/21/16  9:55 AM  Result Value Ref Range   Prothrombin Time 26.8 (H) 11.4 - 15.2 seconds   INR 2.50   Troponin I     Status: Abnormal   Collection Time: 11/21/16  9:55 AM  Result Value Ref Range   Troponin I 0.04 (HH) <0.03 ng/mL    Comment: CRITICAL RESULT CALLED TO, READ BACK BY AND VERIFIED WITH: ROSSER,M. RN AT 1104 11/21/16 MULLINS,T   I-Stat CG4 Lactic Acid, ED     Status: Abnormal   Collection Time: 11/21/16 10:03 AM  Result Value Ref Range   Lactic Acid, Venous 2.44 (HH) 0.5 - 1.9 mmol/L   Comment NOTIFIED PHYSICIAN   Urinalysis, Routine w reflex microscopic     Status: Abnormal   Collection Time: 11/21/16 10:30 AM  Result Value Ref Range   Color, Urine YELLOW YELLOW   APPearance CLEAR CLEAR   Specific Gravity, Urine 1.043 (H) 1.005 - 1.030   pH 7.0 5.0 - 8.0   Glucose, UA NEGATIVE NEGATIVE mg/dL   Hgb urine dipstick NEGATIVE NEGATIVE   Bilirubin Urine NEGATIVE NEGATIVE   Ketones, ur NEGATIVE NEGATIVE mg/dL   Protein, ur NEGATIVE NEGATIVE mg/dL   Nitrite POSITIVE (A) NEGATIVE   Leukocytes, UA NEGATIVE NEGATIVE   RBC / HPF 0-5 0 - 5 RBC/hpf   WBC, UA 0-5 0 - 5 WBC/hpf    Bacteria, UA MANY (A) NONE SEEN   Squamous Epithelial / LPF 0-5 (A) NONE SEEN   Mucus PRESENT    Ct Abdomen Pelvis W Contrast  Result Date: 11/21/2016 CLINICAL DATA:  Abdominal pain for several hours, history of bowel obstruction EXAM: CT ABDOMEN AND PELVIS WITH CONTRAST TECHNIQUE: Multidetector CT imaging of the abdomen and pelvis was performed using the standard protocol following bolus administration of intravenous contrast. CONTRAST:  4m ISOVUE-300 IOPAMIDOL (ISOVUE-300) INJECTION 61% COMPARISON:  03/25/2015 FINDINGS: Lower chest: Lung bases demonstrate patchy airspace consolidation particularly in the right middle and left lower lobes. The left basilar changes are more chronic in nature Hepatobiliary: No focal liver abnormality is seen. No gallstones, gallbladder wall thickening, or biliary dilatation. Pancreas: Pancreas is diffusely fatty infiltrated. No mass lesion is seen. Spleen: Normal in size without focal abnormality. Adrenals/Urinary Tract: The adrenal glands are within normal limits. The kidneys show no mass lesion or hydronephrosis. Delayed images demonstrate normal excretion. The bladder is partially distended. Stomach/Bowel: Diffuse diverticular change of the colon is seen without evidence of diverticulitis. The appendix is been surgically removed. Multiple dilated loops of small bowel beginning in the jejunum are noted. This extends to the midportion of the abdomen likely at the junction of the ileum and jejunum were there is an abrupt transition zone identified. This is best seen on image number 59 of series 2 and image number 41 of series 4. This is similar location to the area of previous partial small bowel obstruction. The more distal small bowel is within normal limits.  Is cecal junction is unremarkable. Vascular/Lymphatic: IVC filter is noted in place. Atherosclerotic changes of the aorta are noted without definitive aneurysmal dilatation. No significant lymphadenopathy is noted.  Reproductive: Uterus and bilateral adnexa are unremarkable. Other: Minimal free fluid is noted within the abdomen. No findings to suggest perforation seen. Musculoskeletal: Degenerative changes of the lumbar spine are noted. IMPRESSION: Changes consistent with partial small-bowel obstruction noted at the junction of the jejunum and ileum in the mid abdomen. This is likely related to adhesions. No definitive internal hernia is identified. Increase in right basilar consolidation involving the right middle lobe. Electronically Signed   By: Inez Catalina M.D.   On: 11/21/2016 09:05      Assessment/Plan Chronic diastolic CHF CKD stage III Paroxysmal A.Fib Hx of PE with IVC Filter Hypothyroidism  SBO - CT: Changes consistent with partial small-bowel obstruction noted at the junction of the jejunum and ileum in the mid abdomen. This is likely related to adhesions - NGT placed with 100 cc out already, some improvement in pain/distention - continue on LIWS - SB protocol after patient has had some time to decompress - no indications for acute surgical intervention, hopefully this will resolve with conservative management Leukocytosis - WBC 21.3, but patient is afebrile and had recent course of steroids Chest pain/Slightly elevated troponin - troponin 0.04 - could be some demand ischemia, would obtain EKG and follow serial troponin - CT also shows some possible consolidation in right lung base - could have aspirated while vomiting  FEN: NPO, IVF; NGT to LIWS VTE: SCDs, IVC filter ID: IV Zosyn due to concern for aspiration PNA potential (11/8>>)  Plan: small bowel protocol   Brigid Re, [facility] Surgery 11/21/2016, 11:29 AM Pager: [phone] Consults: [phone] Mon-Fri 7:00 am-4:30 pm Sat-Sun 7:00 am-11:30 am

## 2016-11-21 NOTE — Progress Notes (Signed)
Pharmacy Antibiotic and Anticoagulation Note  [name] is a 83 y.o. female admitted on 11/21/2016 with intra-abdominal infection and aspiration PNA.  Pharmacy has been consulted for zosyn dosing. First dose zosyn 3.375 gm given in ED at 1010am.  WBC 21.3, creat 1.36. Lactate 3.01>2.52. AF 11/8 abd CT: pSBO, R basilar consolidation of RML  Anticoag: on coumadin PTA for afib and hx PE/DVT s/p IVC filter .  NPO for pSBO.  Pharmacy consulted to start [place] when INR < 2.  Coumadin clinic pt w/ last OP INR 3.0 on 11/12/16. Home dose 2.5 mg daily with last dose 11/7 at 1800.  Admission INR is 2.5. CBC WNL, no bleeding reported.  No wt in EPIC  Plan: Zosyn 3.375g IV q8h (4 hour infusion).  Daily INR - start LMWH when INR < 2 per MD orders F/u weight for [facility] - order placed  Temp (24hrs), Avg:98.4 F (36.9 C), Min:98.4 F (36.9 C), Max:98.4 F (36.9 C)  Recent Labs  Lab 11/21/16 0614 11/21/16 0750 11/21/16 1003  WBC 21.3*  --   --   CREATININE 1.35*  --   --   LATICACIDVEN  --  3.01* 2.44*    CrCl cannot be calculated (Unknown ideal weight.).    Allergies  Allergen Reactions  . Cefdinir     Caused bad diarrhea  . Cortisone     Flushed hands and face   . Prednisone     cuased blood clots Can have very small dose  . Sulfasalazine     Nausea side effects  . Sulfonamide Derivatives     Stomach pains and cramping     Antimicrobials this admission: 11/8 Zosyn >> Dose adjustments this admission:  Microbiology results: 11/8 BCx2>>  Thank you for allowing pharmacy to be a part of this patient's care. Eudelia Bunch, Pharm.D. [phone] 11/21/2016 10:58 AM

## 2016-11-21 NOTE — ED Notes (Signed)
Call report to [place], RN at [phone] at [phone]

## 2016-11-21 NOTE — ED Notes (Signed)
Spoke with Wyline Copas, MD regarding elevated troponin; verbal to continue serial troponin per orders. Loma Sousa, primary RN aware.

## 2016-11-21 NOTE — ED Triage Notes (Signed)
Pt BIB EMS from home with c/o abdominal pain that started around 10pm last night. Pain is reported in the middle of the abdomen without radiation. Pain is intermittent. She reports this happening years ago and she had a bowel obstruction and was admitted. She reports Zofran 4mg  IV #22 Rhand NSL. Patient reports medication helped some.  Denies coffee ground emesis, with hx of DVT, PE. VS: 159/72, 74/ 16resp, 96%RA, 183 cbg. She is legally blind.

## 2016-11-21 NOTE — ED Notes (Signed)
Spoke with Wyline Copas, MD regarding pt elevated lactic acid; verbal to continue basal fluids.

## 2016-11-21 NOTE — ED Notes (Signed)
Date and time results received: 11/21/16 11:09 AM   Test: Troponin  Critical Value:0.04  Name of Provider Notified:Chiu MD  Orders Received? Or Actions Taken?: paged awaiting response. Loma Sousa primary nurse aware.

## 2016-11-21 NOTE — H&P (Signed)
History and Physical    UVA RUNKEL DTO:[id number] DOB: May 06, 1920 DOA: 11/21/2016  PCP: Antony Contras, MD  Patient coming from: Home  Chief Complaint: abd pain, nausea, vomiting  HPI: [name] is a 83 y.o. female with medical history significant of recurrent small bowel obstructions, hypothyroid, paroxysmal atrial fibrillation, prior history of pulmonary embolism with IVC filter placement, CK D, chronic diastolic CHF who presents to the hospital with acute onset abdominal pain and distention started on the evening prior to hospital admission. Symptoms are highly reminiscent of patient's prior small bowel obstructions which historically resolved without surgical intervention. Addition, patient did report at least 10 episodes of nausea and vomiting. Symptoms did not improve and subsequently patient presented to the emergency department for further workup.  ED Course: In emergency form, patient was found to have radiographic evidence of small bowel obstruction on CT scan. Findings are thought related to adhesions on imaging. General surgery was consulted through the emergency department. Patient was started on IV fluid hydration., Patient was found to have a WBC count of 23,000 with chest x-ray findings suggestive of possible infiltrate. Patient was started on empiric Zosyn for coverage of aspiration pneumonia. Hospital service was consulted for consideration for admission  Review of Systems:  ROS  Past Medical History:  Diagnosis Date  . Blindness   . Blood clot associated with vein wall inflammation   . CKD (chronic kidney disease), stage III ([place])   . Colitis   . Goiter    s/p radioactive iod. tx  . Hypothyroidism   . On amiodarone therapy   . Paroxysmal atrial fibrillation (HCC)   . Pulmonary embolus (HCC)    and DVT s/p IVC filter  . SBO (small bowel obstruction) ([place])   . Spinal stenosis of lumbar region with radiculopathy    possible neurogenic claudication recently?  .  Temporal arteritis [facility])     Past Surgical History:  Procedure Laterality Date  . APPENDECTOMY  1940  . BACK SURGERY  04/1998   spinal stenosis  . BACK SURGERY  2000   Ruptured Disc  . BREAST BIOPSY     cysts in both breasts  . CARPAL TUNNEL RELEASE  2002  . ivc filter    . PARTIAL HYSTERECTOMY    . TONSILLECTOMY  1934  . TOTAL KNEE ARTHROPLASTY Left 04/1999  . TOTAL KNEE ARTHROPLASTY Right 02/2000     reports that  has never smoked. she has never used smokeless tobacco. She reports that she does not drink alcohol or use drugs.  Allergies  Allergen Reactions  . Cefdinir     Caused bad diarrhea  . Cortisone     Flushed hands and face   . Prednisone     cuased blood clots Can have very small dose  . Sulfasalazine     Nausea side effects  . Sulfonamide Derivatives     Stomach pains and cramping     Family History  Problem Relation Age of Onset  . Breast cancer Sister   . Congestive Heart Failure Mother   . Stroke Father   . Crohn's disease Neg Hx   . Ulcerative colitis Neg Hx     Prior to Admission medications   Medication Sig Start Date End Date Taking? Authorizing Provider  amiodarone (PACERONE) 200 MG tablet Take 0.5 tablets (100 mg total) by mouth daily. 06/21/16  Yes Belva Crome, MD  Calcium Carb-Cholecalciferol (CALCIUM 600 + D) 600-200 MG-UNIT TABS Take 1 tablet by mouth 3 (three)  times daily.   Yes [provider]  levothyroxine (SYNTHROID, LEVOTHROID) 150 MCG tablet Take 150 mcg by mouth daily before breakfast.   Yes [provider]  Misc Natural Products (GLUCOSAMINE CHONDROITIN COMPLX) TABS Take 1 tablet by mouth 2 (two) times daily.    Yes [provider]  Multiple Vitamins-Minerals (OCUVITE ADULT 50+ PO) Take 1 tablet by mouth daily.   Yes [provider]  prednisoLONE 5 MG TABS tablet Take 5 mg by mouth daily.    Yes [provider]  warfarin (COUMADIN) 2.5 MG tablet Take 1 tablet by mouth daily or as  directed by coumadin clinic Patient taking differently: Take 2.5 mg daily by mouth.  09/24/16  Yes Belva Crome, MD    Physical Exam: Vitals:   11/21/16 0615 11/21/16 0700 11/21/16 0748 11/21/16 0830  BP: (!) 153/56 (!) 149/57  (!) 172/75  Pulse: 68 67  71  Resp: 20 18    Temp:   98.4 F (36.9 C)   TempSrc:   Rectal   SpO2: 93% 94%  92%    Constitutional: NAD, calm, comfortable Vitals:   11/21/16 0615 11/21/16 0700 11/21/16 0748 11/21/16 0830  BP: (!) 153/56 (!) 149/57  (!) 172/75  Pulse: 68 67  71  Resp: 20 18    Temp:   98.4 F (36.9 C)   TempSrc:   Rectal   SpO2: 93% 94%  92%   Eyes: PERRL, lids and conjunctivae normal ENMT: Mucous membranes are moist. Posterior pharynx clear of any exudate or lesions.Normal dentition.  Neck: normal, supple, no masses, no thyromegaly Respiratory: clear to auscultation bilaterally, no wheezing, no crackles. Normal respiratory effort. No accessory muscle use.  Cardiovascular: Regular rate and rhythm, no murmurs / rubs / gallops. No extremity edema. 2+ pedal pulses. No carotid bruits.  Abdomen: no tenderness, no masses palpated. No hepatosplenomegaly. Bowel sounds positive.  Musculoskeletal: no clubbing / cyanosis. No joint deformity upper and lower extremities. Good ROM, no contractures. Normal muscle tone.  Skin: no rashes, lesions, ulcers. No induration Neurologic: CN 2-12 grossly intact. Sensation intact, DTR normal. Strength 5/5 in all 4.  Psychiatric: Normal judgment and insight. Alert and oriented x 3. Normal mood.    Labs on Admission: I have personally reviewed following labs and imaging studies  CBC: Recent Labs  Lab 11/21/16 0614  WBC 21.3*  HGB 13.9  HCT 41.9  MCV 94.8  PLT 122   Basic Metabolic Panel: Recent Labs  Lab 11/21/16 0614  NA 142  K 4.0  CL 105  CO2 26  GLUCOSE 151*  BUN 38*  CREATININE 1.35*  CALCIUM 9.3   GFR: CrCl cannot be calculated (Unknown ideal weight.). Liver Function Tests: Recent  Labs  Lab 11/21/16 0614  AST 31  ALT 18  ALKPHOS 71  BILITOT 1.0  PROT 6.5  ALBUMIN 4.3   Recent Labs  Lab 11/21/16 0614  LIPASE 21   No results for input(s): AMMONIA in the last 168 hours. Coagulation Profile: Recent Labs  Lab 11/21/16 0955  INR 2.50   Cardiac Enzymes: No results for input(s): CKTOTAL, CKMB, CKMBINDEX, TROPONINI in the last 168 hours. BNP (last 3 results) No results for input(s): PROBNP in the last 8760 hours. HbA1C: No results for input(s): HGBA1C in the last 72 hours. CBG: No results for input(s): GLUCAP in the last 168 hours. Lipid Profile: No results for input(s): CHOL, HDL, LDLCALC, TRIG, CHOLHDL, LDLDIRECT in the last 72 hours. Thyroid Function Tests: No results for  input(s): TSH, T4TOTAL, FREET4, T3FREE, THYROIDAB in the last 72 hours. Anemia Panel: No results for input(s): VITAMINB12, FOLATE, FERRITIN, TIBC, IRON, RETICCTPCT in the last 72 hours. Urine analysis:    Component Value Date/Time   COLORURINE YELLOW 03/25/2015 [place] 03/25/2015 1234   LABSPEC 1.023 03/25/2015 1234   PHURINE 6.0 03/25/2015 1234   GLUCOSEU NEGATIVE 03/25/2015 1234   HGBUR NEGATIVE 03/25/2015 1234   BILIRUBINUR NEGATIVE 03/25/2015 [place] 03/25/2015 1234   PROTEINUR NEGATIVE 03/25/2015 1234   UROBILINOGEN 0.2 01/17/2013 0730   NITRITE NEGATIVE 03/25/2015 [place] 03/25/2015 1234   Sepsis Labs: !!!!!!!!!!!!!!!!!!!!!!!!!!!!!!!!!!!!!!!!!!!! @LABRCNTIP (procalcitonin:4,lacticidven:4) )No results found for this or any previous visit (from the past 240 hour(s)).   Radiological Exams on Admission: Ct Abdomen Pelvis W Contrast  Result Date: 11/21/2016 CLINICAL DATA:  Abdominal pain for several hours, history of bowel obstruction EXAM: CT ABDOMEN AND PELVIS WITH CONTRAST TECHNIQUE: Multidetector CT imaging of the abdomen and pelvis was performed using the standard protocol following bolus administration of  intravenous contrast. CONTRAST:  39mL ISOVUE-300 IOPAMIDOL (ISOVUE-300) INJECTION 61% COMPARISON:  03/25/2015 FINDINGS: Lower chest: Lung bases demonstrate patchy airspace consolidation particularly in the right middle and left lower lobes. The left basilar changes are more chronic in nature Hepatobiliary: No focal liver abnormality is seen. No gallstones, gallbladder wall thickening, or biliary dilatation. Pancreas: Pancreas is diffusely fatty infiltrated. No mass lesion is seen. Spleen: Normal in size without focal abnormality. Adrenals/Urinary Tract: The adrenal glands are within normal limits. The kidneys show no mass lesion or hydronephrosis. Delayed images demonstrate normal excretion. The bladder is partially distended. Stomach/Bowel: Diffuse diverticular change of the colon is seen without evidence of diverticulitis. The appendix is been surgically removed. Multiple dilated loops of small bowel beginning in the jejunum are noted. This extends to the midportion of the abdomen likely at the junction of the ileum and jejunum were there is an abrupt transition zone identified. This is best seen on image number 59 of series 2 and image number 41 of series 4. This is similar location to the area of previous partial small bowel obstruction. The more distal small bowel is within normal limits. Is cecal junction is unremarkable. Vascular/Lymphatic: IVC filter is noted in place. Atherosclerotic changes of the aorta are noted without definitive aneurysmal dilatation. No significant lymphadenopathy is noted. Reproductive: Uterus and bilateral adnexa are unremarkable. Other: Minimal free fluid is noted within the abdomen. No findings to suggest perforation seen. Musculoskeletal: Degenerative changes of the lumbar spine are noted. IMPRESSION: Changes consistent with partial small-bowel obstruction noted at the junction of the jejunum and ileum in the mid abdomen. This is likely related to adhesions. No definitive internal  hernia is identified. Increase in right basilar consolidation involving the right middle lobe. Electronically Signed   By: Inez Catalina M.D.   On: 11/21/2016 09:05    EKG: Independently reviewed. NSR  Assessment/Plan Active Problems:   Arthropathy   SBO (small bowel obstruction) (HCC)   Nausea and vomiting   Leukocytosis   CKD (chronic kidney disease), stage IV (HCC)   Aspiration pneumonia (HCC)   Chronic diastolic heart failure ([place])   1. Recurrent SBO 1. Pt with multiple prior episodes for SBO that had historically been self limiting not requiring surgery 2. Abdomen distended, decreased bowel sounds 3. General surgery consulted through the emergency department 4. NG tube ordered per emergency room physician 2. Nausea and vomiting 1. Likely secondary to above small bowel obstruction  2. Continue antiemetics as needed 3. CKD 1. Renal function thus far stable, although patient is clinically dehydrated 2. Continue IV fluids as tolerated 3. Repeat basic metabolic panel morning 4. Aspiration Pneumonia 1. Chest x-ray findings suggestive of possible infiltrates in the setting of nausea and vomiting 2. Pt with WBC of 23k on presentation 3. Continue Zosyn as already ordered 5. Chronic diastolic CHF 1. Clinically dehydrated at present 2. Monitor volume status closely 6. History of atrial fibrillation 1. Presently in sinus rhythm 2. Patient on Coumadin prior to admission, will hold as she is nothing by mouth 3. Would continue patient on therapeutic Lovenox when INR is less than 2 7. Left arm swelling 1. Family reports swelling involving the proximal left upper extremity which appears somewhat improved today 2. Will request ultrasound of the left upper extremity  DVT prophylaxis: Therapeutic coagulation  Code Status: DO NOT RESUSCITATE, confirmed with patient in presence of family members Family Communication: Patient in room, family at bedside  Disposition Plan: Uncertain at this  time  Consults called: Gen. surgery consulted through the ER Admission status: Inpatient as will likely require greater than 2 minutes day to resolve small bowel obstruction   Cristoval Teall, Orpah Melter MD [facility] Pager [phone]  If 7PM-7AM, please contact night-coverage [email or web address] Password [facility]  11/21/2016, 10:24 AM

## 2016-11-21 NOTE — ED Provider Notes (Signed)
I would expect hematuria [facility] Provider Note   CSN: [id number] Arrival date & time: 11/21/16  0548     History   Chief Complaint Chief Complaint  Patient presents with  . Abdominal Pain  . Nausea    HPI [name] is a 83 y.o. female.  HPI   83 year old female with past medical history as below including history of small bowel obstructions here with abdominal pain.  The patient reports acute onset of severe abdominal pain at around midnight.  She then developed a stabbing, diffuse, lower abdominal pain with associated nausea and vomiting.  She has vomited multiple times overnight.  She has not had a bowel movement or passed gas since the onset of her pain.  She states the pain mildly improved on arrival to the ED, but her nausea has persisted.  No diarrhea.  No blood in her bowels.  Denies any fevers.  Denies any dysuria.  No vaginal bleeding or discharge.  Pain is worse with palpation.  Denies any alleviating factors.  Past Medical History:  Diagnosis Date  . Blindness   . Blood clot associated with vein wall inflammation   . CKD (chronic kidney disease), stage III ([place])   . Colitis   . Goiter    s/p radioactive iod. tx  . Hypothyroidism   . On amiodarone therapy   . Paroxysmal atrial fibrillation (HCC)   . Pulmonary embolus (HCC)    and DVT s/p IVC filter  . SBO (small bowel obstruction) ([place])   . Spinal stenosis of lumbar region with radiculopathy    possible neurogenic claudication recently?  . Temporal arteritis [facility])     Patient Active Problem List   Diagnosis Date Noted  . Encounter for monitoring amiodarone therapy 07/07/2016  . CKD (chronic kidney disease), stage III ([place]) 11/28/2015  . Mild anemia 11/28/2015  . Chronic diastolic heart failure ([place]) 12/29/2013  . Encounter for therapeutic drug monitoring 04/07/2013  . SBO (small bowel obstruction) ([place]) 01/17/2013  . Nausea and vomiting 01/17/2013  .  Microscopic colitis 01/17/2013  . Leukocytosis 01/17/2013  . Aspiration pneumonia ([place]) 01/17/2013  . Pulmonary embolus ([place])   . CKD (chronic kidney disease), stage IV ([place])   . SYNCOPE 09/21/2008  . Unspecified disorder of thyroid 09/17/2008  . Atrial fibrillation ([place]) 09/17/2008  . TEMPORAL ARTERITIS 09/17/2008  . Arthropathy 09/17/2008  . DYSPNEA ON EXERTION 09/17/2008    Past Surgical History:  Procedure Laterality Date  . APPENDECTOMY  1940  . BACK SURGERY  04/1998   spinal stenosis  . BACK SURGERY  2000   Ruptured Disc  . BREAST BIOPSY     cysts in both breasts  . CARPAL TUNNEL RELEASE  2002  . ivc filter    . PARTIAL HYSTERECTOMY    . TONSILLECTOMY  1934  . TOTAL KNEE ARTHROPLASTY Left 04/1999  . TOTAL KNEE ARTHROPLASTY Right 02/2000    OB History    No data available       Home Medications    Prior to Admission medications   Medication Sig Start Date End Date Taking? Authorizing Provider  amiodarone (PACERONE) 200 MG tablet Take 0.5 tablets (100 mg total) by mouth daily. 06/21/16  Yes Belva Crome, MD  Calcium Carb-Cholecalciferol (CALCIUM 600 + D) 600-200 MG-UNIT TABS Take 1 tablet by mouth 3 (three) times daily.   Yes [provider]  levothyroxine (SYNTHROID, LEVOTHROID) 150 MCG tablet Take 150 mcg by mouth daily before breakfast.  Yes [provider]  Misc Natural Products (GLUCOSAMINE CHONDROITIN COMPLX) TABS Take 1 tablet by mouth 2 (two) times daily.    Yes [provider]  Multiple Vitamins-Minerals (OCUVITE ADULT 50+ PO) Take 1 tablet by mouth daily.   Yes [provider]  prednisoLONE 5 MG TABS tablet Take 5 mg by mouth daily.    Yes [provider]  warfarin (COUMADIN) 2.5 MG tablet Take 1 tablet by mouth daily or as directed by coumadin clinic Patient taking differently: Take 2.5 mg daily by mouth.  09/24/16  Yes Belva Crome, MD    Family History Family History  Problem Relation Age of Onset  .  Breast cancer Sister   . Congestive Heart Failure Mother   . Stroke Father   . Crohn's disease Neg Hx   . Ulcerative colitis Neg Hx     Social History Social History   Tobacco Use  . Smoking status: Never Smoker  . Smokeless tobacco: Never Used  Substance Use Topics  . Alcohol use: No  . Drug use: No     Allergies   Cefdinir; Cortisone; Prednisone; Sulfasalazine; and Sulfonamide derivatives   Review of Systems Review of Systems  Constitutional: Positive for fatigue. Negative for fever.  Gastrointestinal: Positive for abdominal distention, abdominal pain, nausea and vomiting.  All other systems reviewed and are negative.    Physical Exam Updated Vital Signs BP (!) 136/44 (BP Location: Right Arm)   Pulse 74   Temp 99.5 F (37.5 C) (Oral)   Resp 20   Ht 5\' 4"  (1.626 m)   Wt 65.5 kg (144 lb 6.4 oz)   SpO2 95%   BMI 24.79 kg/m   Physical Exam  Constitutional: She is oriented to person, place, and time. She appears well-developed and well-nourished. No distress.  HENT:  Head: Normocephalic and atraumatic.  Eyes: Conjunctivae are normal.  Neck: Neck supple.  Cardiovascular: Normal rate, regular rhythm and normal heart sounds. Exam reveals no friction rub.  No murmur heard. Pulmonary/Chest: Effort normal and breath sounds normal. No respiratory distress. She has no wheezes. She has no rales.  Abdominal: Normal appearance. She exhibits distension. Bowel sounds are increased. There is generalized tenderness and tenderness in the periumbilical area and suprapubic area. There is no rigidity, no rebound and no guarding.  Musculoskeletal: She exhibits no edema.  Neurological: She is alert and oriented to person, place, and time. She exhibits normal muscle tone.  Skin: Skin is warm. Capillary refill takes less than 2 seconds.  Psychiatric: She has a normal mood and affect.  Nursing note and vitals reviewed.    ED Treatments / Results  Labs (all labs ordered are  listed, but only abnormal results are displayed) Labs Reviewed  COMPREHENSIVE METABOLIC PANEL - Abnormal; Notable for the following components:      Result Value   Glucose, Bld 151 (*)    BUN 38 (*)    Creatinine, Ser 1.35 (*)    GFR calc non Af Amer 32 (*)    GFR calc Af Amer 38 (*)    All other components within normal limits  CBC - Abnormal; Notable for the following components:   WBC 21.3 (*)    All other components within normal limits  URINALYSIS, ROUTINE W REFLEX MICROSCOPIC - Abnormal; Notable for the following components:   Specific Gravity, Urine 1.043 (*)    Nitrite POSITIVE (*)    Bacteria, UA MANY (*)    Squamous Epithelial / LPF 0-5 (*)  All other components within normal limits  PROTIME-INR - Abnormal; Notable for the following components:   Prothrombin Time 26.8 (*)    All other components within normal limits  TROPONIN I - Abnormal; Notable for the following components:   Troponin I 0.04 (*)    All other components within normal limits  LACTIC ACID, PLASMA - Abnormal; Notable for the following components:   Lactic Acid, Venous 2.8 (*)    All other components within normal limits  LACTIC ACID, PLASMA - Abnormal; Notable for the following components:   Lactic Acid, Venous 2.0 (*)    All other components within normal limits  TROPONIN I - Abnormal; Notable for the following components:   Troponin I 0.04 (*)    All other components within normal limits  I-STAT CG4 LACTIC ACID, ED - Abnormal; Notable for the following components:   Lactic Acid, Venous 3.01 (*)    All other components within normal limits  I-STAT CG4 LACTIC ACID, ED - Abnormal; Notable for the following components:   Lactic Acid, Venous 2.44 (*)    All other components within normal limits  CULTURE, BLOOD (ROUTINE X 2)  CULTURE, BLOOD (ROUTINE X 2)  LIPASE, BLOOD  MAGNESIUM  TROPONIN I  COMPREHENSIVE METABOLIC PANEL  CBC  PROTIME-INR    EKG  EKG Interpretation  Date/Time:  Thursday  November 21 2016 09:48:39 EST Ventricular Rate:  75 PR Interval:    QRS Duration: 104 QT Interval:  444 QTC Calculation: 496 R Axis:   -5 Text Interpretation:  Sinus rhythm Incomplete RBBB and LAFB Abnormal R-wave progression, early transition Nonspecific T abnormalities, lateral leads Borderline prolonged QT interval No significant change since last tracing Confirmed by Duffy Bruce [phone]) on 11/21/2016 7:18:50 PM       Radiology Ct Abdomen Pelvis W Contrast  Result Date: 11/21/2016 CLINICAL DATA:  Abdominal pain for several hours, history of bowel obstruction EXAM: CT ABDOMEN AND PELVIS WITH CONTRAST TECHNIQUE: Multidetector CT imaging of the abdomen and pelvis was performed using the standard protocol following bolus administration of intravenous contrast. CONTRAST:  26mL ISOVUE-300 IOPAMIDOL (ISOVUE-300) INJECTION 61% COMPARISON:  03/25/2015 FINDINGS: Lower chest: Lung bases demonstrate patchy airspace consolidation particularly in the right middle and left lower lobes. The left basilar changes are more chronic in nature Hepatobiliary: No focal liver abnormality is seen. No gallstones, gallbladder wall thickening, or biliary dilatation. Pancreas: Pancreas is diffusely fatty infiltrated. No mass lesion is seen. Spleen: Normal in size without focal abnormality. Adrenals/Urinary Tract: The adrenal glands are within normal limits. The kidneys show no mass lesion or hydronephrosis. Delayed images demonstrate normal excretion. The bladder is partially distended. Stomach/Bowel: Diffuse diverticular change of the colon is seen without evidence of diverticulitis. The appendix is been surgically removed. Multiple dilated loops of small bowel beginning in the jejunum are noted. This extends to the midportion of the abdomen likely at the junction of the ileum and jejunum were there is an abrupt transition zone identified. This is best seen on image number 59 of series 2 and image number 41 of series 4.  This is similar location to the area of previous partial small bowel obstruction. The more distal small bowel is within normal limits. Is cecal junction is unremarkable. Vascular/Lymphatic: IVC filter is noted in place. Atherosclerotic changes of the aorta are noted without definitive aneurysmal dilatation. No significant lymphadenopathy is noted. Reproductive: Uterus and bilateral adnexa are unremarkable. Other: Minimal free fluid is noted within the abdomen. No findings to suggest perforation seen.  Musculoskeletal: Degenerative changes of the lumbar spine are noted. IMPRESSION: Changes consistent with partial small-bowel obstruction noted at the junction of the jejunum and ileum in the mid abdomen. This is likely related to adhesions. No definitive internal hernia is identified. Increase in right basilar consolidation involving the right middle lobe. Electronically Signed   By: Inez Catalina M.D.   On: 11/21/2016 09:05   Dg Chest Port 1 View  Result Date: 11/21/2016 CLINICAL DATA:  SBO, ng tube, abd distention, some mid chest discomfort with possible infiltrate seen on ct abd today, ? Aspiration pneumonia EXAM: PORTABLE CHEST 1 VIEW COMPARISON:  01/22/2016 FINDINGS: Nasogastric tube is in place, tip off the image field, beyond the gastroesophageal junction. The heart size is accentuated by the technique. There is new opacity at the right lung base which obscures the hemidiaphragm. There is minimal left lower lobe atelectasis. IMPRESSION: 1. Bibasilar opacities, right greater than left. 2. Nasogastric tube beyond the image beyond the gastroesophageal junction. Electronically Signed   By: Nolon Nations M.D.   On: 11/21/2016 12:44   Dg Abd Portable 1v-small Bowel Protocol-position Verification  Result Date: 11/21/2016 CLINICAL DATA:  NG tube placement for small bowel protocol, Loma Sousa RN given instructions. EXAM: PORTABLE ABDOMEN - 1 VIEW COMPARISON:  CT of the abdomen and pelvis 11/21/2016 FINDINGS:  Nasogastric tube is in place, tip overlying the level of the stomach. There is elevation of right hemidiaphragm. Persistently dilated loops of small bowel identified within the abdomen. No evidence for free intraperitoneal air. There is airspace filling of opacity within the lower lobes bilaterally. IVC catheter in place, the apex overlying L4. IMPRESSION: Interval placement of nasogastric tube. Small bowel obstruction. Bibasilar airspace filling opacities. Electronically Signed   By: Nolon Nations M.D.   On: 11/21/2016 11:38   [place] Lt Upper Extrem Ltd Soft Tissue Non Vascular  Result Date: 11/21/2016 CLINICAL DATA:  Left forearm swelling for 3 weeks. EXAM: ULTRASOUND left UPPER EXTREMITY LIMITED TECHNIQUE: Ultrasound examination of the upper extremity soft tissues was performed in the area of clinical concern. COMPARISON:  None FINDINGS: Expected muscular and subcutaneous tissues are encounter it in the region of concern, without focal fluid collection or obvious mass. IMPRESSION: 1. No significant sonographic abnormality. If symptoms persists despite conservative therapy, MRI may be warranted for further characterization. Electronically Signed   By: Van Clines M.D.   On: 11/21/2016 12:05    Procedures Procedures (including critical care time)  Medications Ordered in ED Medications  iopamidol (ISOVUE-300) 61 % injection (not administered)  0.9 %  sodium chloride infusion ( Intravenous New Bag/Given 11/21/16 1503)  morphine 2 MG/ML injection 1 mg (not administered)  metoprolol tartrate (LOPRESSOR) injection 5 mg (5 mg Intravenous Not Given 11/21/16 1811)  labetalol (NORMODYNE,TRANDATE) injection 5 mg (not administered)  levothyroxine (SYNTHROID, LEVOTHROID) injection 75 mcg (not administered)  acetaminophen (TYLENOL) suppository 650 mg (not administered)  lip balm (CARMEX) ointment 1 application (not administered)  magic mouthwash (not administered)  chlorproMAZINE (THORAZINE) 25 mg in  sodium chloride 0.9 % 25 mL IVPB (not administered)  ondansetron (ZOFRAN) injection 4 mg (not administered)    Or  ondansetron (ZOFRAN) 8 mg in sodium chloride 0.9 % 50 mL IVPB (not administered)  guaiFENesin-dextromethorphan (ROBITUSSIN DM) 100-10 MG/5ML syrup 10 mL (not administered)  hydrocortisone (ANUSOL-HC) 2.5 % rectal cream 1 application (not administered)  alum & mag hydroxide-simeth (MAALOX/MYLANTA) 200-200-20 MG/5ML suspension 30 mL (not administered)  hydrocortisone cream 1 % 1 application (not administered)  menthol-cetylpyridinium (CEPACOL) lozenge 3  mg (not administered)  phenol (CHLORASEPTIC) mouth spray 1-2 spray (not administered)  diphenhydrAMINE (BENADRYL) injection 12.5-25 mg (not administered)  piperacillin-tazobactam (ZOSYN) IVPB 3.375 g (3.375 g Intravenous New Bag/Given 11/21/16 1818)  0.9 %  sodium chloride infusion ( Intravenous New Bag/Given 11/21/16 0747)  ondansetron (ZOFRAN) injection 4 mg (4 mg Intravenous Given 11/21/16 0747)  sodium chloride 0.9 % bolus 1,000 mL (0 mLs Intravenous Stopped 11/21/16 1405)  piperacillin-tazobactam (ZOSYN) IVPB 3.375 g (0 g Intravenous Stopped 11/21/16 1318)  iopamidol (ISOVUE-300) 61 % injection 100 mL (75 mLs Intravenous Contrast Given 11/21/16 0837)  fentaNYL (SUBLIMAZE) injection 50 mcg (50 mcg Intravenous Given 11/21/16 1047)  lactated ringers bolus 1,000 mL (0 mLs Intravenous Stopped 11/21/16 1501)  diatrizoate meglumine-sodium (GASTROGRAFIN) 66-10 % solution 90 mL (90 mLs Per NG tube Given 11/21/16 1520)     Initial Impression / Assessment and Plan / ED Course  I have reviewed the triage vital signs and the nursing notes.  Pertinent labs & imaging results that were available during my care of the patient were reviewed by me and considered in my medical decision making (see chart for details).     83 yo F with h/o SBO, CHF here with diffuse abd pain. Lab work shows moderate leukocytosis, mild likely pre-renal dehydration. LA  also elevated at 3.0, which I suspect is likely 2/2 dehydration. Given her leukocytosis, however, Zosyn given and CT obtained, which is c/w SBO. No signs of perforation. Of note, pt just finished course of prednisone which may also be contributing to her WBC elevation. Will continue cautious fluids given h/o CHF, admit to hospitalist. Pt clinically stable on serial exams, in NAD. General Surgery aware and will c/s on pt.  Final Clinical Impressions(s) / ED Diagnoses   Final diagnoses:  SBO (small bowel obstruction) ([place])  Other elevated white blood cell (WBC) count    ED Discharge Orders    None       Duffy Bruce, MD 11/21/16 1919

## 2016-11-22 ENCOUNTER — Inpatient Hospital Stay (HOSPITAL_COMMUNITY): Payer: Medicare Other

## 2016-11-22 DIAGNOSIS — D72829 Elevated white blood cell count, unspecified: Secondary | ICD-10-CM

## 2016-11-22 DIAGNOSIS — R112 Nausea with vomiting, unspecified: Secondary | ICD-10-CM

## 2016-11-22 DIAGNOSIS — K56609 Unspecified intestinal obstruction, unspecified as to partial versus complete obstruction: Secondary | ICD-10-CM

## 2016-11-22 DIAGNOSIS — I5032 Chronic diastolic (congestive) heart failure: Secondary | ICD-10-CM

## 2016-11-22 DIAGNOSIS — N184 Chronic kidney disease, stage 4 (severe): Secondary | ICD-10-CM

## 2016-11-22 LAB — COMPREHENSIVE METABOLIC PANEL
ALT: 26 U/L (ref 14–54)
AST: 29 U/L (ref 15–41)
Albumin: 2.8 g/dL — ABNORMAL LOW (ref 3.5–5.0)
Alkaline Phosphatase: 38 U/L (ref 38–126)
Anion gap: 8 (ref 5–15)
BILIRUBIN TOTAL: 1.1 mg/dL (ref 0.3–1.2)
BUN: 33 mg/dL — AB (ref 6–20)
CO2: 26 mmol/L (ref 22–32)
CREATININE: 1.35 mg/dL — AB (ref 0.44–1.00)
Calcium: 7.9 mg/dL — ABNORMAL LOW (ref 8.9–10.3)
Chloride: 110 mmol/L (ref 101–111)
GFR calc Af Amer: 38 mL/min — ABNORMAL LOW (ref >60)
GFR, EST NON AFRICAN AMERICAN: 32 mL/min — AB (ref >60)
Glucose, Bld: 137 mg/dL — ABNORMAL HIGH (ref 65–99)
POTASSIUM: 3.9 mmol/L (ref 3.5–5.1)
Sodium: 144 mmol/L (ref 135–145)
TOTAL PROTEIN: 5.1 g/dL — AB (ref 6.5–8.1)

## 2016-11-22 LAB — LACTIC ACID, PLASMA: LACTIC ACID, VENOUS: 1.3 mmol/L (ref 0.5–1.9)

## 2016-11-22 LAB — CBC
HCT: 35.2 % — ABNORMAL LOW (ref 36.0–46.0)
Hemoglobin: 11.3 g/dL — ABNORMAL LOW (ref 12.0–15.0)
MCH: 30.5 pg (ref 26.0–34.0)
MCHC: 32.1 g/dL (ref 30.0–36.0)
MCV: 95.1 fL (ref 78.0–100.0)
PLATELETS: 191 10*3/uL (ref 150–400)
RBC: 3.7 MIL/uL — AB (ref 3.87–5.11)
RDW: 15.3 % (ref 11.5–15.5)
WBC: 12.8 10*3/uL — AB (ref 4.0–10.5)

## 2016-11-22 LAB — PROTIME-INR
INR: 3.82
PROTHROMBIN TIME: 37.3 s — AB (ref 11.4–15.2)

## 2016-11-22 MED ORDER — METOCLOPRAMIDE HCL 5 MG/ML IJ SOLN
5.0000 mg | Freq: Four times a day (QID) | INTRAMUSCULAR | Status: DC | PRN
  Administered 2016-11-22: 10 mg via INTRAVENOUS
  Filled 2016-11-22: qty 2

## 2016-11-22 MED ORDER — MUSCLE RUB 10-15 % EX CREA
1.0000 "application " | TOPICAL_CREAM | CUTANEOUS | Status: DC | PRN
  Filled 2016-11-22: qty 85

## 2016-11-22 MED ORDER — FUROSEMIDE 10 MG/ML IJ SOLN
20.0000 mg | Freq: Once | INTRAMUSCULAR | Status: AC
  Administered 2016-11-22: 20 mg via INTRAVENOUS
  Filled 2016-11-22: qty 2

## 2016-11-22 MED ORDER — HYDRALAZINE HCL 20 MG/ML IJ SOLN: 5.0000 mg | Freq: Four times a day (QID) | INTRAMUSCULAR | Status: DC | PRN

## 2016-11-22 MED ORDER — DIPHENHYDRAMINE HCL 50 MG/ML IJ SOLN: 12.5000 mg | Freq: Four times a day (QID) | INTRAMUSCULAR | Status: DC | PRN

## 2016-11-22 MED ORDER — LACTATED RINGERS IV BOLUS (SEPSIS): 1000.0000 mL | Freq: Three times a day (TID) | INTRAVENOUS | Status: AC | PRN

## 2016-11-22 MED ORDER — BISACODYL 10 MG RE SUPP
10.0000 mg | Freq: Every day | RECTAL | Status: DC
  Administered 2016-11-22 – 2016-11-25 (×4): 10 mg via RECTAL
  Filled 2016-11-22 (×4): qty 1

## 2016-11-22 NOTE — Progress Notes (Signed)
PROGRESS NOTE    [name]  YKD:[id number] DOB: [birth year] DOA: 11/21/2016 PCP: Brittany Contras, MD    Brief Narrative:   [name] is a 83 y.o. female with medical history significant of recurrent small bowel obstructions, hypothyroid, paroxysmal atrial fibrillation, prior history of pulmonary embolism with IVC filter placement, CK D, chronic diastolic CHF who presents to the hospital with acute onset abdominal pain and distention started on the evening prior to hospital admission.    Assessment & Plan:   Active Problems:   Arthropathy   SBO (small bowel obstruction) (HCC)   Nausea and vomiting   Leukocytosis   CKD (chronic kidney disease), stage IV (HCC)   Aspiration pneumonia (HCC)   Chronic diastolic heart failure (HCC)    SBO:  NG tube placed and is on intermittent suction.  SURGERY consulted and recommendations given.  Clears / sips ordered.    Nausea and vomiting : symptomatic management, improved.    Aspiration pneumonia:  On IV antibiotics.  Discussed the results with the patient's family.  Afebrile and improving leukocytosis.    Atrial fibrillation;  Rate controlled. On lovenox when INR is less than 2.   Left arm swelling:  Swelling improved and ruled out DVT.   Chronic diastolic CHF:  Dehydrated , gentle hydration.   Stage 3 CKD:  CREATININE AT BASELINE.    Elevated lactic acid:  Possibly from dehydration and infection.      DVT prophylaxis: coumadin, held.  Code Status: DNR Family Communication: family at bedside.  Disposition Plan: pending PT evaluation.   Consultants:   Surgery    Procedures: none at bedside.    Antimicrobials: zosyn.    Subjective: Feeling better.   Objective: Vitals:   11/21/16 1727 11/21/16 2105 11/22/16 0443 11/22/16 1310  BP: (!) 136/44 (!) 137/54 (!) 131/57 (!) 125/45  Pulse: 74 71 76 70  Resp: 20 20 20    Temp: 99.5 F (37.5 C) 99 F (37.2 C) 98.8 F (37.1 C) 98.2 F (36.8 C)  TempSrc:  Oral Oral Oral Oral  SpO2: 95% 97% 98% 90%  Weight: 65.5 kg (144 lb 6.4 oz)     Height: 5\' 4"  (1.626 m)       Intake/Output Summary (Last 24 hours) at 11/22/2016 1518 Last data filed at 11/22/2016 1037 Gross per 24 hour  Intake 1287.5 ml  Output 675 ml  Net 612.5 ml   Filed Weights   11/21/16 1727  Weight: 65.5 kg (144 lb 6.4 oz)    Examination:  General exam: Appears calm and comfortable , NG TUBE.  Respiratory system: Clear to auscultation. Respiratory effort normal. Cardiovascular system: S1 & S2 heard, RRR. No JVD, murmurs, rubs, gallops or clicks. No pedal edema. Gastrointestinal system: Abdomen is nondistended, soft and nontender. No organomegaly or masses felt. BS slow.  Central nervous system: Alert and oriented. No focal neurological deficits. Extremities: Symmetric 5 x 5 power. Skin: No rashes, lesions or ulcers Psychiatry:  Mood & affect appropriate.     Data Reviewed: I have personally reviewed following labs and imaging studies  CBC: Recent Labs  Lab 11/21/16 0614 11/22/16 0547  WBC 21.3* 12.8*  HGB 13.9 11.3*  HCT 41.9 35.2*  MCV 94.8 95.1  PLT 235 397   Basic Metabolic Panel: Recent Labs  Lab 11/21/16 0614 11/21/16 0955 11/22/16 0547  NA 142  --  144  K 4.0  --  3.9  CL 105  --  110  CO2 26  --  26  GLUCOSE 151*  --  137*  BUN 38*  --  33*  CREATININE 1.35*  --  1.35*  CALCIUM 9.3  --  7.9*  MG  --  1.9  --    GFR: Estimated Creatinine Clearance: 22 mL/min (A) (by C-G formula based on SCr of 1.35 mg/dL (H)). Liver Function Tests: Recent Labs  Lab 11/21/16 0614 11/22/16 0547  AST 31 29  ALT 18 26  ALKPHOS 71 38  BILITOT 1.0 1.1  PROT 6.5 5.1*  ALBUMIN 4.3 2.8*   Recent Labs  Lab 11/21/16 0614  LIPASE 21   No results for input(s): AMMONIA in the last 168 hours. Coagulation Profile: Recent Labs  Lab 11/21/16 0955 11/22/16 0547  INR 2.50 3.82   Cardiac Enzymes: Recent Labs  Lab 11/21/16 0955 11/21/16 1730  11/21/16 2314  TROPONINI 0.04* 0.04* 0.04*   BNP (last 3 results) No results for input(s): PROBNP in the last 8760 hours. HbA1C: No results for input(s): HGBA1C in the last 72 hours. CBG: No results for input(s): GLUCAP in the last 168 hours. Lipid Profile: No results for input(s): CHOL, HDL, LDLCALC, TRIG, CHOLHDL, LDLDIRECT in the last 72 hours. Thyroid Function Tests: No results for input(s): TSH, T4TOTAL, FREET4, T3FREE, THYROIDAB in the last 72 hours. Anemia Panel: No results for input(s): VITAMINB12, FOLATE, FERRITIN, TIBC, IRON, RETICCTPCT in the last 72 hours. Sepsis Labs: Recent Labs  Lab 11/21/16 1003 11/21/16 1335 11/21/16 1730 11/22/16 0918  LATICACIDVEN 2.44* 2.8* 2.0* 1.3    No results found for this or any previous visit (from the past 240 hour(s)).       Radiology Studies: Dg Abd 1 View  Result Date: 11/22/2016 CLINICAL DATA:  Repositioned NG tube EXAM: ABDOMEN - 1 VIEW COMPARISON:  11/21/2016 FINDINGS: The enteric tube has been advanced. Tip is now in the left upper quadrant consistent with location in the body of the stomach. Proximal side hole projects over the upper stomach. Residual contrast material in the colon. Atelectasis in the lung bases. IVC filter present. Thoracolumbar scoliosis convex towards the left. Degenerative changes in the spine. IMPRESSION: Enteric tube is been advanced with tip now projected over the body of the stomach. Electronically Signed   By: [name] M.D.   On: 11/22/2016 05:33   Mr Brain Wo Contrast  Result Date: 11/22/2016  [facility] [address] [place], [place] [address] [phone] NEUROIMAGING REPORT STUDY DATE:11/20/2016 PATIENT NAME: [name] DOB: 12/27/1920 MRN: [id number] ORDERING CLINICIAN: Dr Rexene Hamilton CLINICAL HISTORY:  33 year patient with dizziness COMPARISON FILMS:  MRI Brain wo 04/26/2014 EXAM: MRI Brain wo TECHNIQUE:  MRI of the brain without contrast was obtained utilizing 5 mm  axial slices with T1, T2, T2 flair, T2 star gradient echo and diffusion weighted views.  T1 sagittal and T2 coronal views were obtained. CONTRAST: none IMAGING SITE: [facility] FINDINGS: Brain parenchyma shows mild age-appropriate changes of chronic microvascular ischemia and generalized degree of cerebral atrophy.  No structural lesion, tumor or infarcts are noted.  There are mild changes of chronic paranasal sinusitis.  No other structural lesion, tumor infarcts are seen.No abnormal lesions are seen on diffusion-weighted views to suggest acute ischemia. The cortical sulci, fissures and cisterns are normal in size and appearance. Lateral, third and fourth ventricle are normal in size and appearance. No extra-axial fluid collections are seen. No evidence of mass effect or midline shift.  On sagittal views the posterior fossa, pituitary gland and corpus callosum are unremarkable.  No evidence of intracranial hemorrhage on gradient-echo views. The orbits and their contents, paranasal sinuses and calvarium are unremarkable.  Intracranial flow voids are present.   Slightly abnormal MRI scan showing age-appropriate changes of chronic microvascular ischemia and generalized cerebral atrophy.  No acute abnormality.  No significant change compared with previous MRI dated 04/26/2014 INTERPRETING PHYSICIAN: PRAMOD SETHI, MD Certified in  [place] by [employer] and [employer]   Ct Abdomen Pelvis W Contrast  Result Date: 11/21/2016 CLINICAL DATA:  Abdominal pain for several hours, history of bowel obstruction EXAM: CT ABDOMEN AND PELVIS WITH CONTRAST TECHNIQUE: Multidetector CT imaging of the abdomen and pelvis was performed using the standard protocol following bolus administration of intravenous contrast. CONTRAST:  75mL ISOVUE-300 IOPAMIDOL (ISOVUE-300) INJECTION 61% COMPARISON:  03/25/2015 FINDINGS: Lower chest: Lung bases demonstrate patchy airspace  consolidation particularly in the right middle and left lower lobes. The left basilar changes are more chronic in nature Hepatobiliary: No focal liver abnormality is seen. No gallstones, gallbladder wall thickening, or biliary dilatation. Pancreas: Pancreas is diffusely fatty infiltrated. No mass lesion is seen. Spleen: Normal in size without focal abnormality. Adrenals/Urinary Tract: The adrenal glands are within normal limits. The kidneys show no mass lesion or hydronephrosis. Delayed images demonstrate normal excretion. The bladder is partially distended. Stomach/Bowel: Diffuse diverticular change of the colon is seen without evidence of diverticulitis. The appendix is been surgically removed. Multiple dilated loops of small bowel beginning in the jejunum are noted. This extends to the midportion of the abdomen likely at the junction of the ileum and jejunum were there is an abrupt transition zone identified. This is best seen on image number 59 of series 2 and image number 41 of series 4. This is similar location to the area of previous partial small bowel obstruction. The more distal small bowel is within normal limits. Is cecal junction is unremarkable. Vascular/Lymphatic: IVC filter is noted in place. Atherosclerotic changes of the aorta are noted without definitive aneurysmal dilatation. No significant lymphadenopathy is noted. Reproductive: Uterus and bilateral adnexa are unremarkable. Other: Minimal free fluid is noted within the abdomen. No findings to suggest perforation seen. Musculoskeletal: Degenerative changes of the lumbar spine are noted. IMPRESSION: Changes consistent with partial small-bowel obstruction noted at the junction of the jejunum and ileum in the mid abdomen. This is likely related to adhesions. No definitive internal hernia is identified. Increase in right basilar consolidation involving the right middle lobe. Electronically Signed   By: Inez Catalina M.D.   On: 11/21/2016 09:05   Dg  Chest Port 1 View  Result Date: 11/21/2016 CLINICAL DATA:  SBO, ng tube, abd distention, some mid chest discomfort with possible infiltrate seen on ct abd today, ? Aspiration pneumonia EXAM: PORTABLE CHEST 1 VIEW COMPARISON:  01/22/2016 FINDINGS: Nasogastric tube is in place, tip off the image field, beyond the gastroesophageal junction. The heart size is accentuated by the technique. There is new opacity at the right lung base which obscures the hemidiaphragm. There is minimal left lower lobe atelectasis. IMPRESSION: 1. Bibasilar opacities, right greater than left. 2. Nasogastric tube beyond the image beyond the gastroesophageal junction. Electronically Signed   By: Nolon Nations M.D.   On: 11/21/2016 12:44   Dg Abd Portable 1v-small Bowel Obstruction Protocol-initial, 8 Hr Delay  Result Date: 11/21/2016 CLINICAL DATA:  Small-bowel protocol.  8 hour post contrast image. EXAM: PORTABLE ABDOMEN - 1 VIEW COMPARISON:  11/21/2016 FINDINGS: Limited field of view study does not include  the entire abdomen. An enteric tube is present with tip in the left upper quadrant consistent with location in the upper stomach. The proximal side hole is projected at or just above the expected location of the EG junction. Consider advancement. Diffusely dilated small bowel. Dilute contrast material within the small bowel. No contrast material is definitively identified in the colon although much of the colon is off the field of view. Inferior vena caval filter. Thoracolumbar scoliosis convex towards the left. Degenerative changes in the spine. IMPRESSION: 1. Enteric tube tip appears to be in the upper stomach with proximal side hole probably in the distal esophagus. Advancement is suggested. 2. Gas distended small bowel consistent with small bowel obstruction. No contrast material in the colon suggesting high-grade obstruction. 3. Degenerative changes and scoliosis of the lumbar spine. Electronically Signed   By: [name] M.D.   On: 11/21/2016 23:47   Dg Abd Portable 1v-small Bowel Protocol-position Verification  Result Date: 11/21/2016 CLINICAL DATA:  NG tube placement for small bowel protocol, Loma Sousa RN given instructions. EXAM: PORTABLE ABDOMEN - 1 VIEW COMPARISON:  CT of the abdomen and pelvis 11/21/2016 FINDINGS: Nasogastric tube is in place, tip overlying the level of the stomach. There is elevation of right hemidiaphragm. Persistently dilated loops of small bowel identified within the abdomen. No evidence for free intraperitoneal air. There is airspace filling of opacity within the lower lobes bilaterally. IVC catheter in place, the apex overlying L4. IMPRESSION: Interval placement of nasogastric tube. Small bowel obstruction. Bibasilar airspace filling opacities. Electronically Signed   By: Nolon Nations M.D.   On: 11/21/2016 11:38   [place] Lt Upper Extrem Ltd Soft Tissue Non Vascular  Result Date: 11/21/2016 CLINICAL DATA:  Left forearm swelling for 3 weeks. EXAM: ULTRASOUND left UPPER EXTREMITY LIMITED TECHNIQUE: Ultrasound examination of the upper extremity soft tissues was performed in the area of clinical concern. COMPARISON:  None FINDINGS: Expected muscular and subcutaneous tissues are encounter it in the region of concern, without focal fluid collection or obvious mass. IMPRESSION: 1. No significant sonographic abnormality. If symptoms persists despite conservative therapy, MRI may be warranted for further characterization. Electronically Signed   By: Van Clines M.D.   On: 11/21/2016 12:05        Scheduled Meds: . bisacodyl  10 mg Rectal Daily  . levothyroxine  75 mcg Intravenous Daily  . lip balm  1 application Topical BID  . metoprolol tartrate  5 mg Intravenous Q6H   Continuous Infusions: . sodium chloride 75 mL/hr at 11/21/16 1503  . chlorproMAZINE (THORAZINE) IV    . lactated ringers    . ondansetron (ZOFRAN) IV    . piperacillin-tazobactam (ZOSYN)  IV 3.375 g (11/22/16  1037)     LOS: 1 day    Time spent: 35 minutes.     Hosie Poisson, MD [facility] Pager [phone]   If 7PM-7AM, please contact night-coverage [email or web address] Password [email or web address] 11/22/2016, 3:18 PM

## 2016-11-22 NOTE — Care Management Note (Signed)
Case Management Note  Patient Details  Name: [name] MRN: [id number] Date of Birth: 1920/11/29  Subjective/Objective:83 y/o f admitted w/SBO. From home. Lives alone, but has family staying, & periodically stopping by. Patient has LTC plan-asked for private duty care list as resource for additional help @ home. PT recc HHPT-provided w/HHC agency list-will await choice.                    Action/Plan:d/c home w/HHC.   Expected Discharge Date:  (unknown)               Expected Discharge Plan:  [name]  In-House Referral:     Discharge planning Services  CM Consult  Post Acute Care Choice:    Choice offered to:  Patient  DME Arranged:    DME Agency:     HH Arranged:    [place] Agency:     Status of Service:  In process, will continue to follow  If discussed at Long Length of Stay Meetings, dates discussed:    Additional Comments:  Dessa Phi, RN 11/22/2016, 2:39 PM

## 2016-11-22 NOTE — Progress Notes (Addendum)
Pt c/o feeling nauseated after receiving two doses of PRN zofran and one dose of phenergan see MAR. This RN paged on call Olevia Bowens about abdominal xray result suggesting tube advancement. Olevia Bowens ordered NG tube advancement of 5-6cm. This RN advanced pt's NG tube 6cm without complication. Pt tolerated well. Will continue to monitor pt closely.

## 2016-11-22 NOTE — Progress Notes (Signed)
MRI brain wo contrast from 11/20/16 shows age appropriate changes and no new findings; actually quite stable since last exam from Apr 2016, which is actually very reassuring.  As discussed would recommend PCP FU and consider seeing ENT for dizziness.   Please notify pt or daughter. thx  Star Age, MD, PhD [facility])

## 2016-11-22 NOTE — Progress Notes (Signed)
During report pt. Reported difficulty breathing and expressed concern with possible fluid overload. Lung sounds assessed and were wheezing/wet. On call [facility] paged and made aware. STAT CXR ordered, along with d/c of IV fluids NS at 75 and 20 mg IV lasix to be administered. Pt. And daughter made aware. Will carry out new orders and continue to monitor.

## 2016-11-22 NOTE — Evaluation (Signed)
Physical Therapy Evaluation Patient Details Name: [name] MRN: [id number] DOB: June 19, 1920 Today's Date: 11/22/2016   History of Present Illness  83 y.o. female with medical history significant of recurrent small bowel obstructions, hypothyroid, paroxysmal atrial fibrillation, prior history of pulmonary embolism with IVC filter placement, CKD, chronic diastolic CHF, and admitted for SBO  Clinical Impression  Pt admitted with above diagnosis. Pt currently with functional limitations due to the deficits listed below (see PT Problem List).  Pt will benefit from skilled PT to increase their independence and safety with mobility to allow discharge to the venue listed below.   Pt assisted with ambulating in hallway and plans to d/c home with daughter assist.  Pt encouraged to ambulate with nursing staff during acute stay.     Follow Up Recommendations Home health PT;Supervision/Assistance - 24 hour    Equipment Recommendations  None recommended by PT    Recommendations for Other Services       Precautions / Restrictions Precautions Precautions: Fall Precaution Comments: recent L hip muscle strain per family, NG tube, legally blind Restrictions Weight Bearing Restrictions: No      Mobility  Bed Mobility Overal bed mobility: Needs Assistance Bed Mobility: Supine to Sit     Supine to sit: Supervision     General bed mobility comments: supervision for lines  Transfers Overall transfer level: Needs assistance Equipment used: Rolling walker (2 wheeled) Transfers: Sit to/from Stand Sit to Stand: Min guard         General transfer comment: min/guard for safety, slightly unsteady however able to self correct  Ambulation/Gait Ambulation/Gait assistance: Min assist Ambulation Distance (Feet): 120 Feet Assistive device: Rolling walker (2 wheeled) Gait Pattern/deviations: Step-through pattern;Decreased stride length     General Gait Details: pt used to using rollator so  some assist required to maneuver RW only, verbal cues for environment, tolerated distance well  Stairs            Wheelchair Mobility    Modified Rankin (Stroke Patients Only)       Balance Overall balance assessment: Needs assistance         Standing balance support: Bilateral upper extremity supported Standing balance-Leahy Scale: Poor Standing balance comment: requires UE support, pt reports poor balance                             Pertinent Vitals/Pain Pain Assessment: No/denies pain    Home Living Family/patient expects to be discharged to:: Private residence Living Arrangements: Alone Available Help at Discharge: Family(daughter to stay with pt for 1.5 weeks after d/c) Type of Home: House Home Access: Stairs to enter Entrance Stairs-Rails: Right Entrance Stairs-Number of Steps: 3 Home Layout: Able to live on main level with bedroom/bathroom Home Equipment: Walker - 4 wheels;Bedside commode      Prior Function Level of Independence: Independent with assistive device(s)         Comments: does much better in her own environment due to macular degeneration, family assists as needed and as been staying with her nights recently     Hand Dominance        Extremity/Trunk Assessment        Lower Extremity Assessment Lower Extremity Assessment: Generalized weakness       Communication   Communication: Other (comment)(legally blind)  Cognition Arousal/Alertness: Awake/alert Behavior During Therapy: WFL for tasks assessed/performed Overall Cognitive Status: Within Functional Limits for tasks assessed  General Comments      Exercises     Assessment/Plan    PT Assessment Patient needs continued PT services  PT Problem List Decreased strength;Decreased mobility;Decreased balance;Decreased activity tolerance       PT Treatment Interventions Gait training;DME  instruction;Therapeutic activities;Therapeutic exercise;Patient/family education;Functional mobility training;Stair training    PT Goals (Current goals can be found in the Care Plan section)  Acute Rehab PT Goals PT Goal Formulation: With patient Time For Goal Achievement: 12/06/16 Potential to Achieve Goals: Good    Frequency Min 3X/week   Barriers to discharge        Co-evaluation               AM-PAC PT "6 Clicks" Daily Activity  Outcome Measure Difficulty turning over in bed (including adjusting bedclothes, sheets and blankets)?: None Difficulty moving from lying on back to sitting on the side of the bed? : A Little Difficulty sitting down on and standing up from a chair with arms (e.g., wheelchair, bedside commode, etc,.)?: A Little Help needed moving to and from a bed to chair (including a wheelchair)?: A Little Help needed walking in hospital room?: A Little Help needed climbing 3-5 steps with a railing? : A Little 6 Click Score: 19    End of Session Equipment Utilized During Treatment: Gait belt Activity Tolerance: Patient tolerated treatment well Patient left: in chair;with call bell/phone within reach;with chair alarm set;with family/visitor present Nurse Communication: Mobility status PT Visit Diagnosis: Difficulty in walking, not elsewhere classified (R26.2)    Time: 5732-2025 PT Time Calculation (min) (ACUTE ONLY): 16 min   Charges:   PT Evaluation $PT Eval Low Complexity: 1 Low     PT G CodesCarmelia Bake, PT, DPT 11/22/2016 Pager: [phone]  York Ram E 11/22/2016, 1:58 PM

## 2016-11-22 NOTE — Progress Notes (Signed)
[address] [name], [place] [address] Phone: [phone]  FAX: [facility] [phone] 1920-02-26  CARE TEAM:  PCP: Antony Contras, MD  Outpatient Care Team: Patient Care Team: Antony Contras, MD as PCP - General (Family Medicine)  Inpatient Treatment Team: Treatment Team: Attending Provider: Hosie Poisson, MD; Consulting Physician: Edison Pace, Md, MD; Registered Nurse: Saunders Glance, RN; Rounding Team: Garner Gavel, MD; Technician: Renea Ee, NT; Registered Nurse: Ananias Pilgrim, RN; Case Manager: Dessa Phi, RN   Problem List:   Active Problems:   Arthropathy   SBO (small bowel obstruction) (HCC)   Nausea and vomiting   Leukocytosis   CKD (chronic kidney disease), stage IV ([place])   Aspiration pneumonia ([place])   Chronic diastolic heart failure ([place])      * No surgery found *     Assessment  SBO  Plan:  -SBO protocol - contrast in colon.  Try NGT clamping trial w still having distention & bilious NGT ouput -discussed need for surgery if fails clamping trial.  She initially was against any surgery.  I cautioned her that she would die if her obstruction does not resolve.  She is scared and not waking up as what happened with a family member decades ago.  She is tolerated anesthesia in the past few years without much issues.  I am hopeful she will not require surgery, but I cautioned against her refusal against any surgery at all.  She will think about things.  Her INR is way too elevated anyway, so would require more aggressive correction if needed.  Hopefully we can avoid that.  -VTE prophylaxis- SCDs, etc -mobilize as tolerated to help recovery  20 minutes spent in review, evaluation, examination, counseling, and coordination of care.  More than 50% of that time was spent in counseling.  Adin Hector, M.D., F.A.C.S. Gastrointestinal and Minimally Invasive [facility] [address] [place], [place] [address] [phone] Main / Paging   11/22/2016    Subjective: (Chief complaint)  Had a bowel movement.  Feels better.  Family in room.  Normally active at home and relatively independent.  Scared of the idea of any surgery.  Objective:  Vital signs:  Vitals:   11/21/16 1630 11/21/16 1727 11/21/16 2105 11/22/16 0443  BP: (!) 134/53 (!) 136/44 (!) 137/54 (!) 131/57  Pulse: 72 74 71 76  Resp: (!) '24 20 20 20  ' Temp:  99.5 F (37.5 C) 99 F (37.2 C) 98.8 F (37.1 C)  TempSrc:  Oral Oral Oral  SpO2: 96% 95% 97% 98%  Weight:  65.5 kg (144 lb 6.4 oz)    Height:  '5\' 4"'  (1.626 m)         Intake/Output   Yesterday:  11/08 0701 - 11/09 0700 In: 1237.5 [I.V.:1137.5; IV Piggyback:100] Out: 675 [Emesis/NG output:675] This shift:  No intake/output data recorded.  Bowel function:  Flatus: YES  BM:  YES  Drain: Bilious NG tube output but more thin and low volume.   Physical Exam:  General: Pt awake/alert/oriented x4 in no acute distress Eyes: PERRL, normal EOM.  Sclera clear.  No icterus Neuro: CN II-XII intact w/o focal sensory/motor deficits. Lymph: No head/neck/groin lymphadenopathy Psych:  No delerium/psychosis/paranoia HENT: Normocephalic, Mucus membranes moist.  No thrush Neck: Supple, No tracheal deviation Chest: No chest wall pain w good excursion CV:  Pulses intact.  Regular rhythm  MS: Normal AROM mjr joints.  No obvious deformity  Abdomen: Soft.  Moderately distended.  Nontender.  No evidence of peritonitis.  No incarcerated hernias.  Ext:  No deformity.  No mjr edema.  No cyanosis Skin: No petechiae / purpura  Results:   Labs: Results for orders placed or performed during the hospital encounter of 11/21/16 (from the past 48 hour(s))  Lipase, blood     Status: None   Collection Time: 11/21/16  6:14 AM  Result Value Ref Range   Lipase 21 11 - 51 U/L  Comprehensive  metabolic panel     Status: Abnormal   Collection Time: 11/21/16  6:14 AM  Result Value Ref Range   Sodium 142 135 - 145 mmol/L   Potassium 4.0 3.5 - 5.1 mmol/L   Chloride 105 101 - 111 mmol/L   CO2 26 22 - 32 mmol/L   Glucose, Bld 151 (H) 65 - 99 mg/dL   BUN 38 (H) 6 - 20 mg/dL   Creatinine, Ser 1.35 (H) 0.44 - 1.00 mg/dL   Calcium 9.3 8.9 - 10.3 mg/dL   Total Protein 6.5 6.5 - 8.1 g/dL   Albumin 4.3 3.5 - 5.0 g/dL   AST 31 15 - 41 U/L   ALT 18 14 - 54 U/L   Alkaline Phosphatase 71 38 - 126 U/L   Total Bilirubin 1.0 0.3 - 1.2 mg/dL   GFR calc non Af Amer 32 (L) >60 mL/min   GFR calc Af Amer 38 (L) >60 mL/min    Comment: (NOTE) The eGFR has been calculated using the CKD EPI equation. This calculation has not been validated in all clinical situations. eGFR's persistently <60 mL/min signify possible Chronic Kidney Disease.    Anion gap 11 5 - 15  CBC     Status: Abnormal   Collection Time: 11/21/16  6:14 AM  Result Value Ref Range   WBC 21.3 (H) 4.0 - 10.5 K/uL   RBC 4.42 3.87 - 5.11 MIL/uL   Hemoglobin 13.9 12.0 - 15.0 g/dL   HCT 41.9 36.0 - 46.0 %   MCV 94.8 78.0 - 100.0 fL   MCH 31.4 26.0 - 34.0 pg   MCHC 33.2 30.0 - 36.0 g/dL   RDW 15.2 11.5 - 15.5 %   Platelets 235 150 - 400 K/uL  I-Stat CG4 Lactic Acid, ED     Status: Abnormal   Collection Time: 11/21/16  7:50 AM  Result Value Ref Range   Lactic Acid, Venous 3.01 (HH) 0.5 - 1.9 mmol/L   Comment NOTIFIED PHYSICIAN   Protime-INR     Status: Abnormal   Collection Time: 11/21/16  9:55 AM  Result Value Ref Range   Prothrombin Time 26.8 (H) 11.4 - 15.2 seconds   INR 2.50   Troponin I     Status: Abnormal   Collection Time: 11/21/16  9:55 AM  Result Value Ref Range   Troponin I 0.04 (HH) <0.03 ng/mL    Comment: CRITICAL RESULT CALLED TO, READ BACK BY AND VERIFIED WITH: ROSSER,M. RN AT 1104 11/21/16 MULLINS,T   Magnesium     Status: None   Collection Time: 11/21/16  9:55 AM  Result Value Ref Range   Magnesium 1.9  1.7 - 2.4 mg/dL  I-Stat CG4 Lactic Acid, ED     Status: Abnormal   Collection Time: 11/21/16 10:03 AM  Result Value Ref Range   Lactic Acid, Venous 2.44 (HH) 0.5 - 1.9 mmol/L   Comment NOTIFIED PHYSICIAN   Urinalysis, Routine  w reflex microscopic     Status: Abnormal   Collection Time: 11/21/16 10:30 AM  Result Value Ref Range   Color, Urine YELLOW YELLOW   APPearance CLEAR CLEAR   Specific Gravity, Urine 1.043 (H) 1.005 - 1.030   pH 7.0 5.0 - 8.0   Glucose, UA NEGATIVE NEGATIVE mg/dL   Hgb urine dipstick NEGATIVE NEGATIVE   Bilirubin Urine NEGATIVE NEGATIVE   Ketones, ur NEGATIVE NEGATIVE mg/dL   Protein, ur NEGATIVE NEGATIVE mg/dL   Nitrite POSITIVE (A) NEGATIVE   Leukocytes, UA NEGATIVE NEGATIVE   RBC / HPF 0-5 0 - 5 RBC/hpf   WBC, UA 0-5 0 - 5 WBC/hpf   Bacteria, UA MANY (A) NONE SEEN   Squamous Epithelial / LPF 0-5 (A) NONE SEEN   Mucus PRESENT   Lactic acid, plasma     Status: Abnormal   Collection Time: 11/21/16  1:35 PM  Result Value Ref Range   Lactic Acid, Venous 2.8 (HH) 0.5 - 1.9 mmol/L    Comment: CRITICAL RESULT CALLED TO, READ BACK BY AND VERIFIED WITH: ROSSER,M. RN AT 1413 11/21/16 MULLINS,T   Lactic acid, plasma     Status: Abnormal   Collection Time: 11/21/16  5:30 PM  Result Value Ref Range   Lactic Acid, Venous 2.0 (HH) 0.5 - 1.9 mmol/L    Comment: CRITICAL RESULT CALLED TO, READ BACK BY AND VERIFIED WITH: GURLEY,K AT 1826 ON 11/21/2016 BY MOSLEY,J   Troponin I (q 6hr x 3)     Status: Abnormal   Collection Time: 11/21/16  5:30 PM  Result Value Ref Range   Troponin I 0.04 (HH) <0.03 ng/mL    Comment: CRITICAL VALUE NOTED.  VALUE IS CONSISTENT WITH PREVIOUSLY REPORTED AND CALLED VALUE.  Troponin I (q 6hr x 3)     Status: Abnormal   Collection Time: 11/21/16 11:14 PM  Result Value Ref Range   Troponin I 0.04 (HH) <0.03 ng/mL    Comment: CRITICAL VALUE NOTED.  VALUE IS CONSISTENT WITH PREVIOUSLY REPORTED AND CALLED VALUE.  Comprehensive metabolic panel      Status: Abnormal   Collection Time: 11/22/16  5:47 AM  Result Value Ref Range   Sodium 144 135 - 145 mmol/L   Potassium 3.9 3.5 - 5.1 mmol/L   Chloride 110 101 - 111 mmol/L   CO2 26 22 - 32 mmol/L   Glucose, Bld 137 (H) 65 - 99 mg/dL   BUN 33 (H) 6 - 20 mg/dL   Creatinine, Ser 1.35 (H) 0.44 - 1.00 mg/dL   Calcium 7.9 (L) 8.9 - 10.3 mg/dL   Total Protein 5.1 (L) 6.5 - 8.1 g/dL   Albumin 2.8 (L) 3.5 - 5.0 g/dL   AST 29 15 - 41 U/L   ALT 26 14 - 54 U/L   Alkaline Phosphatase 38 38 - 126 U/L   Total Bilirubin 1.1 0.3 - 1.2 mg/dL   GFR calc non Af Amer 32 (L) >60 mL/min   GFR calc Af Amer 38 (L) >60 mL/min    Comment: (NOTE) The eGFR has been calculated using the CKD EPI equation. This calculation has not been validated in all clinical situations. eGFR's persistently <60 mL/min signify possible Chronic Kidney Disease.    Anion gap 8 5 - 15  CBC     Status: Abnormal   Collection Time: 11/22/16  5:47 AM  Result Value Ref Range   WBC 12.8 (H) 4.0 - 10.5 K/uL   RBC 3.70 (L) 3.87 - 5.11 MIL/uL  Hemoglobin 11.3 (L) 12.0 - 15.0 g/dL   HCT 35.2 (L) 36.0 - 46.0 %   MCV 95.1 78.0 - 100.0 fL   MCH 30.5 26.0 - 34.0 pg   MCHC 32.1 30.0 - 36.0 g/dL   RDW 15.3 11.5 - 15.5 %   Platelets 191 150 - 400 K/uL  Protime-INR     Status: Abnormal   Collection Time: 11/22/16  5:47 AM  Result Value Ref Range   Prothrombin Time 37.3 (H) 11.4 - 15.2 seconds   INR 3.82     Imaging / Studies: Dg Abd 1 View  Result Date: 11/22/2016 CLINICAL DATA:  Repositioned NG tube EXAM: ABDOMEN - 1 VIEW COMPARISON:  11/21/2016 FINDINGS: The enteric tube has been advanced. Tip is now in the left upper quadrant consistent with location in the body of the stomach. Proximal side hole projects over the upper stomach. Residual contrast material in the colon. Atelectasis in the lung bases. IVC filter present. Thoracolumbar scoliosis convex towards the left. Degenerative changes in the spine. IMPRESSION: Enteric tube is  been advanced with tip now projected over the body of the stomach. Electronically Signed   By: Lucienne Capers M.D.   On: 11/22/2016 05:33   Ct Abdomen Pelvis W Contrast  Result Date: 11/21/2016 CLINICAL DATA:  Abdominal pain for several hours, history of bowel obstruction EXAM: CT ABDOMEN AND PELVIS WITH CONTRAST TECHNIQUE: Multidetector CT imaging of the abdomen and pelvis was performed using the standard protocol following bolus administration of intravenous contrast. CONTRAST:  21m ISOVUE-300 IOPAMIDOL (ISOVUE-300) INJECTION 61% COMPARISON:  03/25/2015 FINDINGS: Lower chest: Lung bases demonstrate patchy airspace consolidation particularly in the right middle and left lower lobes. The left basilar changes are more chronic in nature Hepatobiliary: No focal liver abnormality is seen. No gallstones, gallbladder wall thickening, or biliary dilatation. Pancreas: Pancreas is diffusely fatty infiltrated. No mass lesion is seen. Spleen: Normal in size without focal abnormality. Adrenals/Urinary Tract: The adrenal glands are within normal limits. The kidneys show no mass lesion or hydronephrosis. Delayed images demonstrate normal excretion. The bladder is partially distended. Stomach/Bowel: Diffuse diverticular change of the colon is seen without evidence of diverticulitis. The appendix is been surgically removed. Multiple dilated loops of small bowel beginning in the jejunum are noted. This extends to the midportion of the abdomen likely at the junction of the ileum and jejunum were there is an abrupt transition zone identified. This is best seen on image number 59 of series 2 and image number 41 of series 4. This is similar location to the area of previous partial small bowel obstruction. The more distal small bowel is within normal limits. Is cecal junction is unremarkable. Vascular/Lymphatic: IVC filter is noted in place. Atherosclerotic changes of the aorta are noted without definitive aneurysmal dilatation.  No significant lymphadenopathy is noted. Reproductive: Uterus and bilateral adnexa are unremarkable. Other: Minimal free fluid is noted within the abdomen. No findings to suggest perforation seen. Musculoskeletal: Degenerative changes of the lumbar spine are noted. IMPRESSION: Changes consistent with partial small-bowel obstruction noted at the junction of the jejunum and ileum in the mid abdomen. This is likely related to adhesions. No definitive internal hernia is identified. Increase in right basilar consolidation involving the right middle lobe. Electronically Signed   By: MInez CatalinaM.D.   On: 11/21/2016 09:05   Dg Chest Port 1 View  Result Date: 11/21/2016 CLINICAL DATA:  SBO, ng tube, abd distention, some mid chest discomfort with possible infiltrate seen on ct abd today, ?  Aspiration pneumonia EXAM: PORTABLE CHEST 1 VIEW COMPARISON:  01/22/2016 FINDINGS: Nasogastric tube is in place, tip off the image field, beyond the gastroesophageal junction. The heart size is accentuated by the technique. There is new opacity at the right lung base which obscures the hemidiaphragm. There is minimal left lower lobe atelectasis. IMPRESSION: 1. Bibasilar opacities, right greater than left. 2. Nasogastric tube beyond the image beyond the gastroesophageal junction. Electronically Signed   By: Nolon Nations M.D.   On: 11/21/2016 12:44   Dg Abd Portable 1v-small Bowel Obstruction Protocol-initial, 8 Hr Delay  Result Date: 11/21/2016 CLINICAL DATA:  Small-bowel protocol.  8 hour post contrast image. EXAM: PORTABLE ABDOMEN - 1 VIEW COMPARISON:  11/21/2016 FINDINGS: Limited field of view study does not include the entire abdomen. An enteric tube is present with tip in the left upper quadrant consistent with location in the upper stomach. The proximal side hole is projected at or just above the expected location of the EG junction. Consider advancement. Diffusely dilated small bowel. Dilute contrast material within the  small bowel. No contrast material is definitively identified in the colon although much of the colon is off the field of view. Inferior vena caval filter. Thoracolumbar scoliosis convex towards the left. Degenerative changes in the spine. IMPRESSION: 1. Enteric tube tip appears to be in the upper stomach with proximal side hole probably in the distal esophagus. Advancement is suggested. 2. Gas distended small bowel consistent with small bowel obstruction. No contrast material in the colon suggesting high-grade obstruction. 3. Degenerative changes and scoliosis of the lumbar spine. Electronically Signed   By: Lucienne Capers M.D.   On: 11/21/2016 23:47   Dg Abd Portable 1v-small Bowel Protocol-position Verification  Result Date: 11/21/2016 CLINICAL DATA:  NG tube placement for small bowel protocol, Loma Sousa RN given instructions. EXAM: PORTABLE ABDOMEN - 1 VIEW COMPARISON:  CT of the abdomen and pelvis 11/21/2016 FINDINGS: Nasogastric tube is in place, tip overlying the level of the stomach. There is elevation of right hemidiaphragm. Persistently dilated loops of small bowel identified within the abdomen. No evidence for free intraperitoneal air. There is airspace filling of opacity within the lower lobes bilaterally. IVC catheter in place, the apex overlying L4. IMPRESSION: Interval placement of nasogastric tube. Small bowel obstruction. Bibasilar airspace filling opacities. Electronically Signed   By: Nolon Nations M.D.   On: 11/21/2016 11:38   [place] Lt Upper Extrem Ltd Soft Tissue Non Vascular  Result Date: 11/21/2016 CLINICAL DATA:  Left forearm swelling for 3 weeks. EXAM: ULTRASOUND left UPPER EXTREMITY LIMITED TECHNIQUE: Ultrasound examination of the upper extremity soft tissues was performed in the area of clinical concern. COMPARISON:  None FINDINGS: Expected muscular and subcutaneous tissues are encounter it in the region of concern, without focal fluid collection or obvious mass. IMPRESSION: 1. No  significant sonographic abnormality. If symptoms persists despite conservative therapy, MRI may be warranted for further characterization. Electronically Signed   By: Van Clines M.D.   On: 11/21/2016 12:05    Medications / Allergies: per chart  Antibiotics: Anti-infectives (From admission, onward)   Start     Dose/Rate Route Frequency Ordered Stop   11/21/16 1600  piperacillin-tazobactam (ZOSYN) IVPB 3.375 g     3.375 g 12.5 mL/hr over 240 Minutes Intravenous Every 8 hours 11/21/16 1044     11/21/16 0815  piperacillin-tazobactam (ZOSYN) IVPB 3.375 g     3.375 g 100 mL/hr over 30 Minutes Intravenous  Once 11/21/16 0808 11/21/16 1318  Note: Portions of this report may have been transcribed using voice recognition software. Every effort was made to ensure accuracy; however, inadvertent computerized transcription errors may be present.   Any transcriptional errors that result from this process are unintentional.     Adin Hector, M.D., F.A.C.S. Gastrointestinal and Minimally Invasive Surgery [facility] [address] [place], [place] [address] [phone] Main / Paging   11/22/2016

## 2016-11-22 NOTE — Progress Notes (Signed)
[facility] paged and made aware results of CXR were available. MD also made aware pt. Having labored breathing during ambulation to the bathroom. Verbal order for another dose of 20 mg IV lasix given. O2 sats 90%, 3L of O2 applied and sats increased to 98%. Will administer lasix and continue to monitor pt. Closely.

## 2016-11-23 DIAGNOSIS — Z7901 Long term (current) use of anticoagulants: Secondary | ICD-10-CM

## 2016-11-23 LAB — CBC
HCT: 36.1 % (ref 36.0–46.0)
Hemoglobin: 11.5 g/dL — ABNORMAL LOW (ref 12.0–15.0)
MCH: 30.7 pg (ref 26.0–34.0)
MCHC: 31.9 g/dL (ref 30.0–36.0)
MCV: 96.3 fL (ref 78.0–100.0)
Platelets: 189 10*3/uL (ref 150–400)
RBC: 3.75 MIL/uL — ABNORMAL LOW (ref 3.87–5.11)
RDW: 15.4 % (ref 11.5–15.5)
WBC: 12.5 10*3/uL — ABNORMAL HIGH (ref 4.0–10.5)

## 2016-11-23 LAB — BASIC METABOLIC PANEL
Anion gap: 9 (ref 5–15)
BUN: 28 mg/dL — AB (ref 6–20)
CALCIUM: 8 mg/dL — AB (ref 8.9–10.3)
CO2: 27 mmol/L (ref 22–32)
CREATININE: 1.46 mg/dL — AB (ref 0.44–1.00)
Chloride: 106 mmol/L (ref 101–111)
GFR calc non Af Amer: 30 mL/min — ABNORMAL LOW (ref >60)
GFR, EST AFRICAN AMERICAN: 34 mL/min — AB (ref >60)
Glucose, Bld: 103 mg/dL — ABNORMAL HIGH (ref 65–99)
Potassium: 3.3 mmol/L — ABNORMAL LOW (ref 3.5–5.1)
SODIUM: 142 mmol/L (ref 135–145)

## 2016-11-23 LAB — PROTIME-INR
INR: 3.58
PROTHROMBIN TIME: 35.5 s — AB (ref 11.4–15.2)

## 2016-11-23 MED ORDER — ENSURE SURGERY PO LIQD
237.0000 mL | Freq: Two times a day (BID) | ORAL | Status: DC
  Filled 2016-11-23 (×5): qty 237

## 2016-11-23 MED ORDER — SACCHAROMYCES BOULARDII 250 MG PO CAPS
250.0000 mg | ORAL_CAPSULE | Freq: Two times a day (BID) | ORAL | Status: DC
  Administered 2016-11-23 – 2016-11-25 (×4): 250 mg via ORAL
  Filled 2016-11-23 (×4): qty 1

## 2016-11-23 MED ORDER — POTASSIUM CHLORIDE CRYS ER 20 MEQ PO TBCR
40.0000 meq | EXTENDED_RELEASE_TABLET | Freq: Two times a day (BID) | ORAL | Status: AC
  Administered 2016-11-23 – 2016-11-24 (×2): 40 meq via ORAL
  Filled 2016-11-23 (×2): qty 2

## 2016-11-23 MED ORDER — PSYLLIUM 95 % PO PACK
1.0000 | PACK | Freq: Every day | ORAL | Status: DC
  Administered 2016-11-23 – 2016-11-25 (×3): 1 via ORAL
  Filled 2016-11-23 (×3): qty 1

## 2016-11-23 NOTE — Progress Notes (Signed)
[facility]  [address] [place], [place] [address] Phone: [phone]  FAX: [phone]      [name] [id number] 02-19-1920  CARE TEAM:  PCP: Antony Contras, MD  Outpatient Care Team: Patient Care Team: Antony Contras, MD as PCP - General (Family Medicine)  Inpatient Treatment Team: Treatment Team: Attending Provider: Hosie Poisson, MD; Consulting Physician: Edison Pace, Md, MD; Registered Nurse: Saunders Glance, RN; Rounding Team: Garner Gavel, MD; Technician: Renea Ee, NT; Registered Nurse: Ananias Pilgrim, RN; Registered Nurse: Drucilla Chalet, RN; Physical Therapist: Galen Manila, PT; Technician: Vira Blanco, NT; Technician: Demaris Callander, NT; Occupational Therapist: Raymondo Band, OT   Problem List:   Principal Problem:   SBO (small bowel obstruction) ([place]) Active Problems:   Arthropathy   Leukocytosis   CKD (chronic kidney disease), stage IV ([place])   Aspiration pneumonia ([place])   Chronic diastolic heart failure ([place])   Anticoagulated on warfarin      * No surgery found *     Assessment  SBO resloving  Plan:  -I removed nasogastric tube.  She is very happy to have it removed.  -Pured diet. Advance to soft diet as tolerated. -Minimize IV fluids. -VTE prophylaxis- SCDs, etc -mobilize as tolerated to help recovery  20 minutes spent in review, evaluation, examination, counseling, and coordination of care.  More than 50% of that time was spent in counseling.  [name], M.D., F.A.C.S. Gastrointestinal and Minimally Invasive Surgery [facility] [address] [place], [place] [address] [phone] Main / Paging   11/23/2016    Subjective: (Chief complaint)  Had a bowel movement. Feels better. Still feels abdomen is a little distended but much improved. Daughter in room.  Objective:  Vital signs:  Vitals:   11/22/16 2058  11/22/16 2059 11/23/16 0057 11/23/16 0609  BP:  (!) 140/53 (!) 119/40 (!) 129/56  Pulse:  67 64 67  Resp:  20  18  Temp:  99.6 F (37.6 C)  98.2 F (36.8 C)  TempSrc:  Oral  Oral  SpO2: 90% 96%  98%  Weight:      Height:        Last BM Date: 11/21/16  Intake/Output   Yesterday:  11/09 0701 - 11/10 0700 In: 708.8 [I.V.:658.8; IV Piggyback:50] Out: 800 [Urine:800] This shift:  Total I/O In: -  Out: 2 [Urine:1; Stool:1]  Bowel function:  Flatus: YES  BM:  YES  Drain: Bilious NG tube output but more thin and low volume.   Physical Exam:  General: Pt awake/alert/oriented x4 in no acute distress Eyes: PERRL, normal EOM.  Sclera clear.  No icterus Neuro: CN II-XII intact w/o focal sensory/motor deficits. Lymph: No head/neck/groin lymphadenopathy Psych:  No delerium/psychosis/paranoia HENT: Normocephalic, Mucus membranes moist.  No thrush Neck: Supple, No tracheal deviation Chest: No chest wall pain w good excursion CV:  Pulses intact.  Regular rhythm MS: Normal AROM mjr joints.  No obvious deformity  Abdomen: Soft.  Mildy distended.  Nontender.  No evidence of peritonitis.  No incarcerated hernias.  Ext:  No deformity.  No mjr edema.  No cyanosis Skin: No petechiae / purpura  Results:   Labs: Results for orders placed or performed during the hospital encounter of 11/21/16 (from the past 48 hour(s))  Lactic acid, plasma     Status: Abnormal   Collection Time: 11/21/16  1:35 PM  Result Value Ref Range   Lactic Acid,  Venous 2.8 (HH) 0.5 - 1.9 mmol/L    Comment: CRITICAL RESULT CALLED TO, READ BACK BY AND VERIFIED WITH: ROSSER,M. RN AT 1413 11/21/16 MULLINS,T   Lactic acid, plasma     Status: Abnormal   Collection Time: 11/21/16  5:30 PM  Result Value Ref Range   Lactic Acid, Venous 2.0 (HH) 0.5 - 1.9 mmol/L    Comment: CRITICAL RESULT CALLED TO, READ BACK BY AND VERIFIED WITH: GURLEY,K AT 1826 ON 11/21/2016 BY MOSLEY,J   Troponin I (q 6hr x 3)     Status:  Abnormal   Collection Time: 11/21/16  5:30 PM  Result Value Ref Range   Troponin I 0.04 (HH) <0.03 ng/mL    Comment: CRITICAL VALUE NOTED.  VALUE IS CONSISTENT WITH PREVIOUSLY REPORTED AND CALLED VALUE.  Troponin I (q 6hr x 3)     Status: Abnormal   Collection Time: 11/21/16 11:14 PM  Result Value Ref Range   Troponin I 0.04 (HH) <0.03 ng/mL    Comment: CRITICAL VALUE NOTED.  VALUE IS CONSISTENT WITH PREVIOUSLY REPORTED AND CALLED VALUE.  Comprehensive metabolic panel     Status: Abnormal   Collection Time: 11/22/16  5:47 AM  Result Value Ref Range   Sodium 144 135 - 145 mmol/L   Potassium 3.9 3.5 - 5.1 mmol/L   Chloride 110 101 - 111 mmol/L   CO2 26 22 - 32 mmol/L   Glucose, Bld 137 (H) 65 - 99 mg/dL   BUN 33 (H) 6 - 20 mg/dL   Creatinine, Ser 1.35 (H) 0.44 - 1.00 mg/dL   Calcium 7.9 (L) 8.9 - 10.3 mg/dL   Total Protein 5.1 (L) 6.5 - 8.1 g/dL   Albumin 2.8 (L) 3.5 - 5.0 g/dL   AST 29 15 - 41 U/L   ALT 26 14 - 54 U/L   Alkaline Phosphatase 38 38 - 126 U/L   Total Bilirubin 1.1 0.3 - 1.2 mg/dL   GFR calc non Af Amer 32 (L) >60 mL/min   GFR calc Af Amer 38 (L) >60 mL/min    Comment: (NOTE) The eGFR has been calculated using the CKD EPI equation. This calculation has not been validated in all clinical situations. eGFR's persistently <60 mL/min signify possible Chronic Kidney Disease.    Anion gap 8 5 - 15  CBC     Status: Abnormal   Collection Time: 11/22/16  5:47 AM  Result Value Ref Range   WBC 12.8 (H) 4.0 - 10.5 K/uL   RBC 3.70 (L) 3.87 - 5.11 MIL/uL   Hemoglobin 11.3 (L) 12.0 - 15.0 g/dL   HCT 35.2 (L) 36.0 - 46.0 %   MCV 95.1 78.0 - 100.0 fL   MCH 30.5 26.0 - 34.0 pg   MCHC 32.1 30.0 - 36.0 g/dL   RDW 15.3 11.5 - 15.5 %   Platelets 191 150 - 400 K/uL  Protime-INR     Status: Abnormal   Collection Time: 11/22/16  5:47 AM  Result Value Ref Range   Prothrombin Time 37.3 (H) 11.4 - 15.2 seconds   INR 3.82   Lactic acid, plasma     Status: None   Collection Time:  11/22/16  9:18 AM  Result Value Ref Range   Lactic Acid, Venous 1.3 0.5 - 1.9 mmol/L  Protime-INR     Status: Abnormal   Collection Time: 11/23/16  7:03 AM  Result Value Ref Range   Prothrombin Time 35.5 (H) 11.4 - 15.2 seconds   INR 3.58  CBC     Status: Abnormal   Collection Time: 11/23/16  7:03 AM  Result Value Ref Range   WBC 12.5 (H) 4.0 - 10.5 K/uL   RBC 3.75 (L) 3.87 - 5.11 MIL/uL   Hemoglobin 11.5 (L) 12.0 - 15.0 g/dL   HCT 36.1 36.0 - 46.0 %   MCV 96.3 78.0 - 100.0 fL   MCH 30.7 26.0 - 34.0 pg   MCHC 31.9 30.0 - 36.0 g/dL   RDW 15.4 11.5 - 15.5 %   Platelets 189 150 - 400 K/uL  Basic metabolic panel     Status: Abnormal   Collection Time: 11/23/16  7:03 AM  Result Value Ref Range   Sodium 142 135 - 145 mmol/L   Potassium 3.3 (L) 3.5 - 5.1 mmol/L   Chloride 106 101 - 111 mmol/L   CO2 27 22 - 32 mmol/L   Glucose, Bld 103 (H) 65 - 99 mg/dL   BUN 28 (H) 6 - 20 mg/dL   Creatinine, Ser 1.46 (H) 0.44 - 1.00 mg/dL   Calcium 8.0 (L) 8.9 - 10.3 mg/dL   GFR calc non Af Amer 30 (L) >60 mL/min   GFR calc Af Amer 34 (L) >60 mL/min    Comment: (NOTE) The eGFR has been calculated using the CKD EPI equation. This calculation has not been validated in all clinical situations. eGFR's persistently <60 mL/min signify possible Chronic Kidney Disease.    Anion gap 9 5 - 15    Imaging / Studies: Dg Abd 1 View  Result Date: 11/22/2016 CLINICAL DATA:  Repositioned NG tube EXAM: ABDOMEN - 1 VIEW COMPARISON:  11/21/2016 FINDINGS: The enteric tube has been advanced. Tip is now in the left upper quadrant consistent with location in the body of the stomach. Proximal side hole projects over the upper stomach. Residual contrast material in the colon. Atelectasis in the lung bases. IVC filter present. Thoracolumbar scoliosis convex towards the left. Degenerative changes in the spine. IMPRESSION: Enteric tube is been advanced with tip now projected over the body of the stomach. Electronically  Signed   By: Lucienne Capers M.D.   On: 11/22/2016 05:33   Dg Chest Port 1 View  Result Date: 11/22/2016 CLINICAL DATA:  Short of breath EXAM: PORTABLE CHEST 1 VIEW COMPARISON:  Set radiograph 11/21/2016 FINDINGS: NG tube in stomach. Bibasilar effusions and atelectasis. Upper lungs are clear. IMPRESSION: No interval change. Bibasilar atelectasis and effusions. Electronically Signed   By: Suzy Bouchard M.D.   On: 11/22/2016 19:54   Dg Abd Portable 1v-small Bowel Obstruction Protocol-initial, 8 Hr Delay  Result Date: 11/21/2016 CLINICAL DATA:  Small-bowel protocol.  8 hour post contrast image. EXAM: PORTABLE ABDOMEN - 1 VIEW COMPARISON:  11/21/2016 FINDINGS: Limited field of view study does not include the entire abdomen. An enteric tube is present with tip in the left upper quadrant consistent with location in the upper stomach. The proximal side hole is projected at or just above the expected location of the EG junction. Consider advancement. Diffusely dilated small bowel. Dilute contrast material within the small bowel. No contrast material is definitively identified in the colon although much of the colon is off the field of view. Inferior vena caval filter. Thoracolumbar scoliosis convex towards the left. Degenerative changes in the spine. IMPRESSION: 1. Enteric tube tip appears to be in the upper stomach with proximal side hole probably in the distal esophagus. Advancement is suggested. 2. Gas distended small bowel consistent with small bowel obstruction. No contrast material  in the colon suggesting high-grade obstruction. 3. Degenerative changes and scoliosis of the lumbar spine. Electronically Signed   By: Lucienne Capers M.D.   On: 11/21/2016 23:47    Medications / Allergies: per chart  Antibiotics: Anti-infectives (From admission, onward)   Start     Dose/Rate Route Frequency Ordered Stop   11/21/16 1600  piperacillin-tazobactam (ZOSYN) IVPB 3.375 g     3.375 g 12.5 mL/hr over 240  Minutes Intravenous Every 8 hours 11/21/16 1044     11/21/16 0815  piperacillin-tazobactam (ZOSYN) IVPB 3.375 g     3.375 g 100 mL/hr over 30 Minutes Intravenous  Once 11/21/16 0808 11/21/16 1318        Note: Portions of this report may have been transcribed using voice recognition software. Every effort was made to ensure accuracy; however, inadvertent computerized transcription errors may be present.   Any transcriptional errors that result from this process are unintentional.     [name], M.D., F.A.C.S. Gastrointestinal and Minimally Invasive Surgery [facility] [address] [place], [place] [address] [phone] Main / Paging   11/23/2016

## 2016-11-23 NOTE — Evaluation (Signed)
Occupational Therapy Evaluation Patient Details Name: [name] MRN: [id number] DOB: February 13, 1920 Today's Date: 11/23/2016    History of Present Illness 83 y.o. female with medical history significant of recurrent small bowel obstructions, hypothyroid, paroxysmal atrial fibrillation, prior history of pulmonary embolism with IVC filter placement, CKD, chronic diastolic CHF, and admitted for SBO   Clinical Impression   This 83 y/o F presents with the above. At baseline Pt is mod independent with ADLs and functional mobility. Pt most recently was receiving assist from her daughters for ADL completion due to recent L hip muscle strain. Pt completed functional mobility within room at RW level with MinA this session, requires Min-ModA for UB/LB ADLs secondary to LUE functional limitations and recent L hip strain. Pt will benefit from continued acute OT services and recommend additional [place] services after discharge to maximize Pt's safety and independence with ADLs and mobility.     Follow Up Recommendations  Home health OT;Supervision/Assistance - 24 hour    Equipment Recommendations  None recommended by OT           Precautions / Restrictions Precautions Precautions: Fall Precaution Comments: recent L hip muscle strain per family, legally blind Restrictions Weight Bearing Restrictions: No      Mobility Bed Mobility Overal bed mobility: Needs Assistance Bed Mobility: Supine to Sit     Supine to sit: Supervision     General bed mobility comments: supervision for lines  Transfers Overall transfer level: Needs assistance Equipment used: Rolling walker (2 wheeled) Transfers: Sit to/from Stand Sit to Stand: Min assist         General transfer comment: Assist to rise and steady; verbal cues for hand placement when standing/sitting as Pt tends to keep both hands on RW     Balance Overall balance assessment: Needs assistance         Standing balance support: Bilateral  upper extremity supported;During functional activity Standing balance-Leahy Scale: Poor Standing balance comment: requires UE support, pt reports poor balance                           ADL either performed or assessed with clinical judgement   ADL Overall ADL's : Needs assistance/impaired Eating/Feeding: Set up;Sitting   Grooming: Wash/dry hands;Minimal assistance;Standing Grooming Details (indicate cue type and reason): Min steadying assist in standing  Upper Body Bathing: Minimal assistance;Sitting Upper Body Bathing Details (indicate cue type and reason): MinA as Pt reports increased difficulty completing task due to LUE pain with reaching towards R side  Lower Body Bathing: Minimal assistance;Sit to/from stand   Upper Body Dressing : Minimal assistance;Sitting   Lower Body Dressing: Sit to/from stand;Moderate assistance   Toilet Transfer: Minimal assistance;Ambulation;Comfort height toilet;Grab bars;RW   Toileting- Clothing Manipulation and Hygiene: Minimal assistance;Sit to/from stand Toileting - Clothing Manipulation Details (indicate cue type and reason): Pt completing peri hygiene in sitting; Min steadying assist in standing while Pt completes clothing management      Functional mobility during ADLs: Minimal assistance;Rolling walker       Vision Baseline Vision/History: Macular Degeneration;Legally blind Additional Comments: Pt legaglly blind at baseline; has lived in her home for about 50 years and is very familiar with moving within her home environment; was able to naviate within hospital room with MinA                 Pertinent Vitals/Pain Pain Assessment: Faces Faces Pain Scale: Hurts a little bit Pain Location: LUE with internal  rotation  Pain Descriptors / Indicators: Sore Pain Intervention(s): Limited activity within patient's tolerance;Monitored during session     Hand Dominance Right   Extremity/Trunk Assessment Upper Extremity  Assessment Upper Extremity Assessment: LUE deficits/detail LUE Deficits / Details: Pt reports pain with internal rotation, Pt has knot within region of L bicep; [name] reports Pt had a scheduled MRI for UE but then was admitted to hospital prior to completion  LUE: Unable to fully assess due to pain   Lower Extremity Assessment Lower Extremity Assessment: Defer to PT evaluation       Communication Communication Communication: Other (comment)(legally blind )   Cognition Arousal/Alertness: Awake/alert Behavior During Therapy: WFL for tasks assessed/performed Overall Cognitive Status: Within Functional Limits for tasks assessed                                     General Comments  Pt's daughters present during session                Home Living Family/patient expects to be discharged to:: Private residence Living Arrangements: Alone Available Help at Discharge: Family(daughter staying initially after d/c ) Type of Home: House Home Access: Stairs to enter [employer] of Steps: 3 Entrance Stairs-Rails: Right Home Layout: Able to live on main level with bedroom/bathroom     Bathroom Shower/Tub: Teacher, early years/pre: Standard     Home Equipment: Environmental consultant - 4 wheels;Bedside commode;Shower seat          Prior Functioning/Environment Level of Independence: Needs assistance    ADL's / Homemaking Assistance Needed: family has recently been assisting with LB ADLs secondary to recent L hip muscle strain and pain in L UE; prior to this Pt was mod independent with ADLs    Comments: does much better in her own environment due to macular degeneration, family assists as needed and as been staying with her nights recently        OT Problem List: Decreased strength;Impaired balance (sitting and/or standing);Pain;Decreased range of motion;Impaired UE functional use;Decreased activity tolerance;Decreased knowledge of use of DME or AE       OT Treatment/Interventions: Self-care/ADL training;DME and/or AE instruction;Therapeutic activities;Balance training;Therapeutic exercise;Energy conservation;Patient/family education    OT Goals(Current goals can be found in the care plan section) Acute Rehab OT Goals Patient Stated Goal: return home, regain independence OT Goal Formulation: With patient Time For Goal Achievement: 12/07/16 Potential to Achieve Goals: Good  OT Frequency: Min 2X/week                             AM-PAC PT "6 Clicks" Daily Activity     Outcome Measure Help from another person eating meals?: None Help from another person taking care of personal grooming?: A Little Help from another person toileting, which includes using toliet, bedpan, or urinal?: A Little Help from another person bathing (including washing, rinsing, drying)?: A Little Help from another person to put on and taking off regular upper body clothing?: A Little Help from another person to put on and taking off regular lower body clothing?: A Lot 6 Click Score: 18   End of Session Equipment Utilized During Treatment: Gait belt;Rolling walker Nurse Communication: Mobility status  Activity Tolerance: Patient tolerated treatment well Patient left: in chair;with call bell/phone within reach;with chair alarm set;with nursing/sitter in room;with family/visitor present(NT present to take vitals)  OT  Visit Diagnosis: Unsteadiness on feet (R26.81);Muscle weakness (generalized) (M62.81)                Time: 5486-8852 OT Time Calculation (min): 28 min Charges:  OT General Charges $OT Visit: 1 Visit OT Evaluation $OT Eval Moderate Complexity: 1 Mod OT Treatments $Self Care/Home Management : 8-22 mins G-Codes:     Lou Cal, OT Pager [phone] 11/23/2016   Raymondo Band 11/23/2016, 2:47 PM

## 2016-11-23 NOTE — Progress Notes (Signed)
Physical Therapy Treatment Patient Details Name: [name] MRN: [id number] DOB: [birth year] Today's Date: 11/23/2016    History of Present Illness 83 y.o. female with medical history significant of recurrent small bowel obstructions, hypothyroid, paroxysmal atrial fibrillation, prior history of pulmonary embolism with IVC filter placement, CKD, chronic diastolic CHF, and admitted for SBO    PT Comments    Pt able to increase ambulation distance today.  Reviewed her own normal HEP she does and encouraged her to complete as tolerated. Pt is very appreciative of PT services and states she "loves Physical Therapy".   Follow Up Recommendations  Home health PT;Supervision/Assistance - 24 hour     Equipment Recommendations  None recommended by PT    Recommendations for Other Services       Precautions / Restrictions Precautions Precautions: Fall Precaution Comments: recent L hip muscle strain per family, legally blind Restrictions Weight Bearing Restrictions: No    Mobility  Bed Mobility Overal bed mobility: Needs Assistance Bed Mobility: Supine to Sit     Supine to sit: Supervision     General bed mobility comments: supervision for lines  Transfers Overall transfer level: Needs assistance Equipment used: Rolling walker (2 wheeled) Transfers: Sit to/from Stand Sit to Stand: Min guard         General transfer comment: cues need for hand placement.  When asked how she does it at home, pt was able to remember and place hands correctly  Ambulation/Gait Ambulation/Gait assistance: Min guard;Min assist Ambulation Distance (Feet): 140 Feet Assistive device: Rolling walker (2 wheeled) Gait Pattern/deviations: Step-through pattern;Decreased stride length     General Gait Details: Pt c/o generalized soreness in hips, but did well overall with RW   Stairs            Wheelchair Mobility    Modified Rankin (Stroke Patients Only)       Balance Overall  balance assessment: Needs assistance         Standing balance support: Bilateral upper extremity supported;During functional activity Standing balance-Leahy Scale: Poor Standing balance comment: Can take 1 hand off of RW briefly                            Cognition Arousal/Alertness: Awake/alert Behavior During Therapy: WFL for tasks assessed/performed Overall Cognitive Status: Within Functional Limits for tasks assessed                                        Exercises      General Comments General comments (skin integrity, edema, etc.): Pt states she loves physical therapy.      Pertinent Vitals/Pain Pain Assessment: Faces Faces Pain Scale: Hurts a little bit Pain Location: B hips Pain Descriptors / Indicators: Sore;Aching Pain Intervention(s): Monitored during session;Limited activity within patient's tolerance;Repositioned    Home Living Family/patient expects to be discharged to:: Private residence Living Arrangements: Alone Available Help at Discharge: Family(daughter staying initially after d/c ) Type of Home: House Home Access: Stairs to enter Entrance Stairs-Rails: Right Home Layout: Able to live on main level with bedroom/bathroom Home Equipment: Walker - 4 wheels;Bedside commode;Shower seat      Prior Function Level of Independence: Needs assistance    ADL's / Homemaking Assistance Needed: family has recently been assisting with LB ADLs secondary to recent L hip muscle strain and pain in L UE; prior to this  Pt was mod independent with ADLs  Comments: does much better in her own environment due to macular degeneration, family assists as needed and as been staying with her nights recently   PT Goals (current goals can now be found in the care plan section) Acute Rehab PT Goals Patient Stated Goal: return home, regain independence PT Goal Formulation: With patient Time For Goal Achievement: 12/06/16 Potential to Achieve Goals:  Good Progress towards PT goals: Progressing toward goals    Frequency    Min 3X/week      PT Plan Current plan remains appropriate    Co-evaluation              AM-PAC PT "6 Clicks" Daily Activity  Outcome Measure  Difficulty turning over in bed (including adjusting bedclothes, sheets and blankets)?: None Difficulty moving from lying on back to sitting on the side of the bed? : A Little Difficulty sitting down on and standing up from a chair with arms (e.g., wheelchair, bedside commode, etc,.)?: A Little Help needed moving to and from a bed to chair (including a wheelchair)?: A Little Help needed walking in hospital room?: A Little Help needed climbing 3-5 steps with a railing? : A Little 6 Click Score: 19    End of Session Equipment Utilized During Treatment: Gait belt Activity Tolerance: Patient tolerated treatment well Patient left: in chair;with call bell/phone within reach;with chair alarm set;with family/visitor present Nurse Communication: Mobility status PT Visit Diagnosis: Difficulty in walking, not elsewhere classified (R26.2)     Time: 1440-1500 PT Time Calculation (min) (ACUTE ONLY): 20 min  Charges:  $Gait Training: 8-22 mins                    G Codes:       [name] L. [name], [place] Pager [phone] 11/23/2016    Galen Manila 11/23/2016, 4:51 PM

## 2016-11-23 NOTE — Progress Notes (Signed)
PROGRESS NOTE    Brittany GUILBAULT  BMW:[id number] DOB: 03-05-1920 DOA: 11/21/2016 PCP: Antony Contras, MD    Brief Narrative:   [name] is a 83 y.o. female with medical history significant of recurrent small bowel obstructions, hypothyroid, paroxysmal atrial fibrillation, prior history of pulmonary embolism with IVC filter placement, CK D, chronic diastolic CHF who presents to the hospital with acute onset abdominal pain and distention started on the evening prior to hospital admission.    Assessment & Plan:   Principal Problem:   SBO (small bowel obstruction) (HCC) Active Problems:   Arthropathy   Leukocytosis   CKD (chronic kidney disease), stage IV (HCC)   Aspiration pneumonia (HCC)   Chronic diastolic heart failure (HCC)   Anticoagulated on warfarin    SBO:  Appears to have resolve,d she reports three bm, over night and passing flatus and is able to tolerate clear liquids. SURGERY consulted and recommendations given.  NG tube removed by surgery and advance diet as tolerated and as recommended by surgery.    Nausea and vomiting : symptomatic management, improved.    Aspiration pneumonia:  On IV antibiotics.  Discussed the results with the patient's family.  Afebrile and improving leukocytosis.  Over night, she had sob and was given two doses of IV lasix after which, her symptoms improved.  CXR shows basilar effusions.    Atrial fibrillation;  Rate controlled. On lovenox when INR is less than 2.   Left arm swelling:  Swelling improved and ruled out DVT.   Chronic diastolic CHF:  Compensated.   Stage 3 CKD:  CREATININE AT BASELINE.    Elevated lactic acid:  Back to normal today.   Hypokalemia replaced.      DVT prophylaxis: coumadin, held.  Code Status: DNR Family Communication: family at bedside.  Disposition Plan: pending PT evaluation.   Consultants:   Surgery    Procedures: none at bedside.    Antimicrobials: zosyn.     Subjective: Feeling better.   Objective: Vitals:   11/22/16 2059 11/23/16 0057 11/23/16 0609 11/23/16 1429  BP: (!) 140/53 (!) 119/40 (!) 129/56 (!) 131/50  Pulse: 67 64 67 65  Resp: 20  18 17   Temp: 99.6 F (37.6 C)  98.2 F (36.8 C) 98 F (36.7 C)  TempSrc: Oral  Oral Oral  SpO2: 96%  98% 92%  Weight:      Height:        Intake/Output Summary (Last 24 hours) at 11/23/2016 1739 Last data filed at 11/23/2016 0900 Gross per 24 hour  Intake -  Output 802 ml  Net -802 ml   Filed Weights   11/21/16 1727  Weight: 65.5 kg (144 lb 6.4 oz)    Examination:  General exam: Appears calm and comfortable , NG TUBE. Removed.  Respiratory system: Clear to auscultation. Respiratory effort normal. Diminished at bases.  Cardiovascular system: S1 & S2 heard, RRR. No JVD, No pedal edema. Gastrointestinal system: Abdomen is nondistended, soft and nontender. No organomegaly or masses felt. BS slow.  Central nervous system: Alert and oriented. No focal neurological deficits. Extremities: Symmetric 5 x 5 power. Skin: No rashes, lesions or ulcers Psychiatry:  Mood & affect appropriate.     Data Reviewed: I have personally reviewed following labs and imaging studies  CBC: Recent Labs  Lab 11/21/16 0614 11/22/16 0547 11/23/16 0703  WBC 21.3* 12.8* 12.5*  HGB 13.9 11.3* 11.5*  HCT 41.9 35.2* 36.1  MCV 94.8 95.1 96.3  PLT 235 191 189  Basic Metabolic Panel: Recent Labs  Lab 11/21/16 0614 11/21/16 0955 11/22/16 0547 11/23/16 0703  NA 142  --  144 142  K 4.0  --  3.9 3.3*  CL 105  --  110 106  CO2 26  --  26 27  GLUCOSE 151*  --  137* 103*  BUN 38*  --  33* 28*  CREATININE 1.35*  --  1.35* 1.46*  CALCIUM 9.3  --  7.9* 8.0*  MG  --  1.9  --   --    GFR: Estimated Creatinine Clearance: 20.3 mL/min (A) (by C-G formula based on SCr of 1.46 mg/dL (H)). Liver Function Tests: Recent Labs  Lab 11/21/16 0614 11/22/16 0547  AST 31 29  ALT 18 26  ALKPHOS 71 38   BILITOT 1.0 1.1  PROT 6.5 5.1*  ALBUMIN 4.3 2.8*   Recent Labs  Lab 11/21/16 0614  LIPASE 21   No results for input(s): AMMONIA in the last 168 hours. Coagulation Profile: Recent Labs  Lab 11/21/16 0955 11/22/16 0547 11/23/16 0703  INR 2.50 3.82 3.58   Cardiac Enzymes: Recent Labs  Lab 11/21/16 0955 11/21/16 1730 11/21/16 2314  TROPONINI 0.04* 0.04* 0.04*   BNP (last 3 results) No results for input(s): PROBNP in the last 8760 hours. HbA1C: No results for input(s): HGBA1C in the last 72 hours. CBG: No results for input(s): GLUCAP in the last 168 hours. Lipid Profile: No results for input(s): CHOL, HDL, LDLCALC, TRIG, CHOLHDL, LDLDIRECT in the last 72 hours. Thyroid Function Tests: No results for input(s): TSH, T4TOTAL, FREET4, T3FREE, THYROIDAB in the last 72 hours. Anemia Panel: No results for input(s): VITAMINB12, FOLATE, FERRITIN, TIBC, IRON, RETICCTPCT in the last 72 hours. Sepsis Labs: Recent Labs  Lab 11/21/16 1003 11/21/16 1335 11/21/16 1730 11/22/16 0918  LATICACIDVEN 2.44* 2.8* 2.0* 1.3    Recent Results (from the past 240 hour(s))  Blood culture (routine x 2)     Status: None (Preliminary result)   Collection Time: 11/21/16  9:11 AM  Result Value Ref Range Status   Specimen Description BLOOD RIGHT ANTECUBITAL  Final   Special Requests   Final    BOTTLES DRAWN AEROBIC AND ANAEROBIC Blood Culture adequate volume   Culture   Final    NO GROWTH 2 DAYS Performed at [facility], [place] [address] [place], [place] [address]    Report Status PENDING  Incomplete  Blood culture (routine x 2)     Status: None (Preliminary result)   Collection Time: 11/21/16  9:55 AM  Result Value Ref Range Status   Specimen Description BLOOD LEFT ANTECUBITAL  Final   Special Requests IN PEDIATRIC BOTTLE Blood Culture adequate volume  Final   Culture   Final    NO GROWTH 2 DAYS Performed at [facility], [place] [address], [place], [place] [address]     Report Status PENDING  Incomplete         Radiology Studies: Dg Abd 1 View  Result Date: 11/22/2016 CLINICAL DATA:  Repositioned NG tube EXAM: ABDOMEN - 1 VIEW COMPARISON:  11/21/2016 FINDINGS: The enteric tube has been advanced. Tip is now in the left upper quadrant consistent with location in the body of the stomach. Proximal side hole projects over the upper stomach. Residual contrast material in the colon. Atelectasis in the lung bases. IVC filter present. Thoracolumbar scoliosis convex towards the left. Degenerative changes in the spine. IMPRESSION: Enteric tube is been advanced with tip now projected over the  body of the stomach. Electronically Signed   By: Lucienne Capers M.D.   On: 11/22/2016 05:33   Dg Chest Port 1 View  Result Date: 11/22/2016 CLINICAL DATA:  Short of breath EXAM: PORTABLE CHEST 1 VIEW COMPARISON:  Set radiograph 11/21/2016 FINDINGS: NG tube in stomach. Bibasilar effusions and atelectasis. Upper lungs are clear. IMPRESSION: No interval change. Bibasilar atelectasis and effusions. Electronically Signed   By: Suzy Bouchard M.D.   On: 11/22/2016 19:54   Dg Abd Portable 1v-small Bowel Obstruction Protocol-initial, 8 Hr Delay  Result Date: 11/21/2016 CLINICAL DATA:  Small-bowel protocol.  8 hour post contrast image. EXAM: PORTABLE ABDOMEN - 1 VIEW COMPARISON:  11/21/2016 FINDINGS: Limited field of view study does not include the entire abdomen. An enteric tube is present with tip in the left upper quadrant consistent with location in the upper stomach. The proximal side hole is projected at or just above the expected location of the EG junction. Consider advancement. Diffusely dilated small bowel. Dilute contrast material within the small bowel. No contrast material is definitively identified in the colon although much of the colon is off the field of view. Inferior vena caval filter. Thoracolumbar scoliosis convex towards the left. Degenerative changes in the spine.  IMPRESSION: 1. Enteric tube tip appears to be in the upper stomach with proximal side hole probably in the distal esophagus. Advancement is suggested. 2. Gas distended small bowel consistent with small bowel obstruction. No contrast material in the colon suggesting high-grade obstruction. 3. Degenerative changes and scoliosis of the lumbar spine. Electronically Signed   By: Lucienne Capers M.D.   On: 11/21/2016 23:47        Scheduled Meds: . bisacodyl  10 mg Rectal Daily  . feeding supplement  237 mL Oral BID BM  . levothyroxine  75 mcg Intravenous Daily  . lip balm  1 application Topical BID  . metoprolol tartrate  5 mg Intravenous Q6H  . psyllium  1 packet Oral Daily  . saccharomyces boulardii  250 mg Oral BID   Continuous Infusions: . chlorproMAZINE (THORAZINE) IV    . lactated ringers    . ondansetron (ZOFRAN) IV    . piperacillin-tazobactam (ZOSYN)  IV 3.375 g (11/23/16 1628)     LOS: 2 days    Time spent: 35 minutes.     Hosie Poisson, MD [facility] Pager [phone]   If 7PM-7AM, please contact night-coverage [email or web address] Password [facility] 11/23/2016, 5:39 PM

## 2016-11-24 ENCOUNTER — Inpatient Hospital Stay (HOSPITAL_COMMUNITY): Payer: Medicare Other

## 2016-11-24 DIAGNOSIS — I340 Nonrheumatic mitral (valve) insufficiency: Secondary | ICD-10-CM

## 2016-11-24 LAB — PROTIME-INR
INR: 3.74
Prothrombin Time: 36.7 seconds — ABNORMAL HIGH (ref 11.4–15.2)

## 2016-11-24 LAB — BASIC METABOLIC PANEL
Anion gap: 11 (ref 5–15)
BUN: 20 mg/dL (ref 6–20)
CALCIUM: 8.6 mg/dL — AB (ref 8.9–10.3)
CO2: 28 mmol/L (ref 22–32)
CREATININE: 1.48 mg/dL — AB (ref 0.44–1.00)
Chloride: 100 mmol/L — ABNORMAL LOW (ref 101–111)
GFR calc non Af Amer: 29 mL/min — ABNORMAL LOW (ref >60)
GFR, EST AFRICAN AMERICAN: 34 mL/min — AB (ref >60)
GLUCOSE: 114 mg/dL — AB (ref 65–99)
Potassium: 3.4 mmol/L — ABNORMAL LOW (ref 3.5–5.1)
Sodium: 139 mmol/L (ref 135–145)

## 2016-11-24 LAB — ECHOCARDIOGRAM COMPLETE
HEIGHTINCHES: 64 in
WEIGHTICAEL: 2310.42 [oz_av]

## 2016-11-24 MED ORDER — AMOXICILLIN-POT CLAVULANATE 500-125 MG PO TABS
500.0000 mg | ORAL_TABLET | Freq: Two times a day (BID) | ORAL | Status: DC
  Administered 2016-11-24 – 2016-11-25 (×2): 500 mg via ORAL
  Filled 2016-11-24 (×3): qty 1

## 2016-11-24 MED ORDER — METHOCARBAMOL 500 MG PO TABS: 1000.0000 mg | ORAL_TABLET | Freq: Four times a day (QID) | ORAL | Status: DC | PRN

## 2016-11-24 MED ORDER — SALINE SPRAY 0.65 % NA SOLN
1.0000 | NASAL | Status: DC | PRN
  Filled 2016-11-24: qty 44

## 2016-11-24 MED ORDER — LEVOTHYROXINE SODIUM 75 MCG PO TABS
150.0000 ug | ORAL_TABLET | Freq: Every day | ORAL | Status: DC
  Administered 2016-11-25: 150 ug via ORAL
  Filled 2016-11-24: qty 2

## 2016-11-24 MED ORDER — ACETAMINOPHEN 325 MG PO TABS: 325.0000 mg | ORAL_TABLET | Freq: Four times a day (QID) | ORAL | Status: DC | PRN

## 2016-11-24 MED ORDER — FUROSEMIDE 10 MG/ML IJ SOLN
20.0000 mg | Freq: Once | INTRAMUSCULAR | Status: AC
  Administered 2016-11-24: 20 mg via INTRAVENOUS
  Filled 2016-11-24: qty 2

## 2016-11-24 MED ORDER — AMOXICILLIN-POT CLAVULANATE 875-125 MG PO TABS: 1.0000 | ORAL_TABLET | Freq: Two times a day (BID) | ORAL | Status: DC

## 2016-11-24 NOTE — Progress Notes (Signed)
  Echocardiogram 2D Echocardiogram has been performed.  [name] F 11/24/2016, 11:56 AM

## 2016-11-24 NOTE — Progress Notes (Signed)
PHARMACY NOTE:  ANTIMICROBIAL RENAL DOSAGE ADJUSTMENT  Current antimicrobial regimen includes a mismatch between antimicrobial dosage and estimated renal function.  As per policy approved by the Pharmacy & Therapeutics and Medical Executive Committees, the antimicrobial dosage will be adjusted accordingly.  Current antimicrobial dosage:  Augmentin 875 mg BID  Indication: aspiration PNA  Renal Function:  Estimated Creatinine Clearance: 20.3 mL/min (A) (by C-G formula based on SCr of 1.46 mg/dL (H)). []      On intermittent HD, scheduled: []      On CRRT    Antimicrobial dosage has been changed to:  500 mg BID  Additional comments:   Thank you for allowing pharmacy to be a part of this patient's care.  [name] A, [facility] 11/24/2016 9:59 AM

## 2016-11-24 NOTE — Progress Notes (Signed)
PROGRESS NOTE    [name]  UUV:[id number] DOB: 03-Dec-1920 DOA: 11/21/2016 PCP: Antony Contras, MD    Brief Narrative:   [name] is a 83 y.o. female with medical history significant of recurrent small bowel obstructions, hypothyroid, paroxysmal atrial fibrillation, prior history of pulmonary embolism with IVC filter placement, CK D, chronic diastolic CHF who presents to the hospital with acute onset abdominal pain and distention started on the evening prior to hospital admission.    Assessment & Plan:   Principal Problem:   SBO (small bowel obstruction) (HCC) Active Problems:   Arthropathy   Leukocytosis   CKD (chronic kidney disease), stage IV (HCC)   Aspiration pneumonia (HCC)   Chronic diastolic heart failure (HCC)   Anticoagulated on warfarin    SBO:  Appears to have resolved, she reports  and passing flatus and is able to tolerate clear liquids. SURGERY consulted and recommendations given.  NG tube removed by surgery and advance diet as tolerated and as recommended by surgery.  Currently on dysphagia diet, continue the same.    Nausea and vomiting : symptomatic management, improved.    Aspiration pneumonia:  On IV antibiotics transition to oral augmentin to complete the course. Afebrile and improving leukocytosis.  Over night, she had sob and was given two doses of IV lasix after which, her symptoms improved. She continues to have some sob with relief of symptoms with lasix.  CXR shows basilar effusions.  Plan to wean her off the oxygen in the next 24 hours.  Repeat CXR in am, evaluate for resolution of the aspiration pneumonia and improvement in the basilar effusions.     Atrial fibrillation;  Rate controlled. On lovenox when INR is less than 2.  Supra therapeutic iNR, probably from the IV antibiotics.   Left arm swelling:  Swelling improved and ruled out DVT.   Chronic diastolic CHF:  She does not appear to be overtly fluid overloaded, though  reports symptomatic relief with IV lasix.  Monitor creatinine on IV lasix. Echocardiogram ordered.  Prn lasix as needed for fluid overload.    Hypertension: not well controlled. Add prn hydralazine.   Stage 3 CKD:  CREATININE AT BASELINE.    Elevated lactic acid:  Back to normal today.   Hypokalemia replaced.    Normocytic anemia: Stable.    DVT prophylaxis: coumadin, held. For supratherapeutic iNR.  Code Status: DNR Family Communication: family at bedside.  Disposition Plan: Home with Home Health.   Consultants:   Surgery    Procedures: none at bedside.    Antimicrobials: zosyn. Transitioned to augmentin.    Subjective: Feeling better after she got th elasix. . Still on [place] oxygen.   Objective: Vitals:   11/24/16 0645 11/24/16 0648 11/24/16 0929 11/24/16 1401  BP: (!) 150/45   (!) 162/58  Pulse: 72   67  Resp: 20   18  Temp: 98.3 F (36.8 C)   97.7 F (36.5 C)  TempSrc: Oral   Oral  SpO2: 98% 95% 92% 96%  Weight:      Height:        Intake/Output Summary (Last 24 hours) at 11/24/2016 1547 Last data filed at 11/24/2016 0818 Gross per 24 hour  Intake 300 ml  Output 800 ml  Net -500 ml   Filed Weights   11/21/16 1727  Weight: 65.5 kg (144 lb 6.4 oz)    Examination:  General exam: Appears calm and comfortable , NG TUBE. Removed.  Respiratory system: Clear to auscultation.  Respiratory effort normal. Diminished at bases.  Cardiovascular system: S1 & S2 heard, RRR. No JVD, No pedal edema. Gastrointestinal system: Abdomen is nondistended, soft and nontender. No organomegaly or masses felt. BS slow.  Central nervous system: Alert and oriented. No focal neurological deficits. Extremities: Symmetric 5 x 5 power. Skin: No rashes, lesions or ulcers Psychiatry:  Mood & affect appropriate.     Data Reviewed: I have personally reviewed following labs and imaging studies  CBC: Recent Labs  Lab 11/21/16 0614 11/22/16 0547 11/23/16 0703  WBC 21.3*  12.8* 12.5*  HGB 13.9 11.3* 11.5*  HCT 41.9 35.2* 36.1  MCV 94.8 95.1 96.3  PLT 235 191 563   Basic Metabolic Panel: Recent Labs  Lab 11/21/16 0614 11/21/16 0955 11/22/16 0547 11/23/16 0703  NA 142  --  144 142  K 4.0  --  3.9 3.3*  CL 105  --  110 106  CO2 26  --  26 27  GLUCOSE 151*  --  137* 103*  BUN 38*  --  33* 28*  CREATININE 1.35*  --  1.35* 1.46*  CALCIUM 9.3  --  7.9* 8.0*  MG  --  1.9  --   --    GFR: Estimated Creatinine Clearance: 20.3 mL/min (A) (by C-G formula based on SCr of 1.46 mg/dL (H)). Liver Function Tests: Recent Labs  Lab 11/21/16 0614 11/22/16 0547  AST 31 29  ALT 18 26  ALKPHOS 71 38  BILITOT 1.0 1.1  PROT 6.5 5.1*  ALBUMIN 4.3 2.8*   Recent Labs  Lab 11/21/16 0614  LIPASE 21   No results for input(s): AMMONIA in the last 168 hours. Coagulation Profile: Recent Labs  Lab 11/21/16 0955 11/22/16 0547 11/23/16 0703 11/24/16 0508  INR 2.50 3.82 3.58 3.74   Cardiac Enzymes: Recent Labs  Lab 11/21/16 0955 11/21/16 1730 11/21/16 2314  TROPONINI 0.04* 0.04* 0.04*   BNP (last 3 results) No results for input(s): PROBNP in the last 8760 hours. HbA1C: No results for input(s): HGBA1C in the last 72 hours. CBG: No results for input(s): GLUCAP in the last 168 hours. Lipid Profile: No results for input(s): CHOL, HDL, LDLCALC, TRIG, CHOLHDL, LDLDIRECT in the last 72 hours. Thyroid Function Tests: No results for input(s): TSH, T4TOTAL, FREET4, T3FREE, THYROIDAB in the last 72 hours. Anemia Panel: No results for input(s): VITAMINB12, FOLATE, FERRITIN, TIBC, IRON, RETICCTPCT in the last 72 hours. Sepsis Labs: Recent Labs  Lab 11/21/16 1003 11/21/16 1335 11/21/16 1730 11/22/16 0918  LATICACIDVEN 2.44* 2.8* 2.0* 1.3    Recent Results (from the past 240 hour(s))  Blood culture (routine x 2)     Status: None (Preliminary result)   Collection Time: 11/21/16  9:11 AM  Result Value Ref Range Status   Specimen Description BLOOD RIGHT  ANTECUBITAL  Final   Special Requests   Final    BOTTLES DRAWN AEROBIC AND ANAEROBIC Blood Culture adequate volume   Culture   Final    NO GROWTH 3 DAYS Performed at [facility], [place] [address] [place], [place] [address]    Report Status PENDING  Incomplete  Blood culture (routine x 2)     Status: None (Preliminary result)   Collection Time: 11/21/16  9:55 AM  Result Value Ref Range Status   Specimen Description BLOOD LEFT ANTECUBITAL  Final   Special Requests IN PEDIATRIC BOTTLE Blood Culture adequate volume  Final   Culture   Final    NO GROWTH 3 DAYS Performed at  [facility], [place] [address], [place], [place] [address]    Report Status PENDING  Incomplete         Radiology Studies: Dg Chest Port 1 View  Result Date: 11/22/2016 CLINICAL DATA:  Short of breath EXAM: PORTABLE CHEST 1 VIEW COMPARISON:  Set radiograph 11/21/2016 FINDINGS: NG tube in stomach. Bibasilar effusions and atelectasis. Upper lungs are clear. IMPRESSION: No interval change. Bibasilar atelectasis and effusions. Electronically Signed   By: Suzy Bouchard M.D.   On: 11/22/2016 19:54        Scheduled Meds: . amoxicillin-clavulanate  500 mg Oral BID  . bisacodyl  10 mg Rectal Daily  . feeding supplement  237 mL Oral BID BM  . levothyroxine  75 mcg Intravenous Daily  . lip balm  1 application Topical BID  . metoprolol tartrate  5 mg Intravenous Q6H  . psyllium  1 packet Oral Daily  . saccharomyces boulardii  250 mg Oral BID   Continuous Infusions: . chlorproMAZINE (THORAZINE) IV    . ondansetron (ZOFRAN) IV       LOS: 3 days    Time spent: 35 minutes.     Hosie Poisson, MD [facility] Pager [phone]   If 7PM-7AM, please contact night-coverage [email or web address] Password [facility] 11/24/2016, 3:47 PM

## 2016-11-24 NOTE — Progress Notes (Signed)
[place]  [address] [place], [place] [address] Phone: [phone]  FAX: [place] [phone] 09-Dec-1920  CARE TEAM:  PCP: Antony Contras, MD  Outpatient Care Team: Patient Care Team: Antony Contras, MD as PCP - General (Family Medicine)  Inpatient Treatment Team: Treatment Team: Attending Provider: Hosie Poisson, MD; Consulting Physician: Edison Pace, Md, MD; Registered Nurse: Saunders Glance, RN; Rounding Team: Garner Gavel, MD; Technician: Renea Ee, NT; Technician: Vira Blanco, NT; Technician: Demaris Callander, NT; Registered Nurse: Helayne Seminole, RN; Registered Nurse: Layla Maw, RN   Problem List:   Principal Problem:   SBO (small bowel obstruction) [facility]) Active Problems:   CKD (chronic kidney disease), stage IV ([place])   Chronic diastolic heart failure ([place])   Arthropathy   Leukocytosis   Aspiration pneumonia ([place])   Anticoagulated on warfarin      * No surgery found *     Assessment  SBO resolving  Plan:  -Soft diet, adv to [place] as tolerated. -Minimize IV fluids.  Primary to start diuresis -VTE prophylaxis- SCDs, etc -mobilize as tolerated to help recovery -get her up more.  20 minutes spent in review, evaluation, examination, counseling, and coordination of care.  More than 50% of that time was spent in counseling.  Adin Hector, M.D., F.A.C.S. Gastrointestinal and Minimally Invasive Surgery [facility] [address] [place], [place] [address] [phone] Main / Paging   11/24/2016    Subjective: (Chief complaint)  Worried about her breathing but talking fine.  Walking in hallways.  Wanting to walk more.  Appetite down but no nausea or vomiting.  Daughters in room.  Objective:  Vital signs:  Vitals:   11/23/16 2055 11/24/16 0129 11/24/16 0645 11/24/16 0648  BP: (!) 139/51 (!) 157/64 (!) 150/45    Pulse: 66 66 72   Resp: '20 20 20   ' Temp: 98.2 F (36.8 C) 98.5 F (36.9 C) 98.3 F (36.8 C)   TempSrc: Oral Axillary Oral   SpO2: 95% 91% 98% 95%  Weight:      Height:        Last BM Date: 11/23/16  Intake/Output   Yesterday:  11/10 0701 - 11/11 0700 In: 250 [IV Piggyback:250] Out: 2 [Urine:1; Stool:1] This shift:  Total I/O In: 50 [IV Piggyback:50] Out: 800 [Urine:800]  Bowel function:  Flatus: YES  BM:  YES  Drain: (No drain)   Physical Exam:  General: Pt awake/alert/oriented x4 in no acute distress Eyes: PERRL, normal EOM.  Sclera clear.  No icterus Neuro: CN II-XII intact w/o focal sensory/motor deficits. Lymph: No head/neck/groin lymphadenopathy Psych:  No delerium/psychosis/paranoia.  Mildly anxious but consolable. HENT: Normocephalic, Mucus membranes moist.  No thrush Neck: Supple, No tracheal deviation Chest: No chest wall pain w good excursion CV:  Pulses intact.  Regular rhythm MS: Normal AROM mjr joints.  No obvious deformity  Abdomen: Soft.  Nondistended.  Nontender.  No evidence of peritonitis.  No incarcerated hernias.  Ext:  No deformity.  No mjr edema.  No cyanosis Skin: No petechiae / purpura  Results:   Labs: Results for orders placed or performed during the hospital encounter of 11/21/16 (from the past 48 hour(s))  Lactic acid, plasma     Status: None   Collection Time: 11/22/16  9:18 AM  Result Value Ref Range   Lactic Acid, Venous 1.3 0.5 - 1.9 mmol/L  Protime-INR  Status: Abnormal   Collection Time: 11/23/16  7:03 AM  Result Value Ref Range   Prothrombin Time 35.5 (H) 11.4 - 15.2 seconds   INR 3.58   CBC     Status: Abnormal   Collection Time: 11/23/16  7:03 AM  Result Value Ref Range   WBC 12.5 (H) 4.0 - 10.5 K/uL   RBC 3.75 (L) 3.87 - 5.11 MIL/uL   Hemoglobin 11.5 (L) 12.0 - 15.0 g/dL   HCT 36.1 36.0 - 46.0 %   MCV 96.3 78.0 - 100.0 fL   MCH 30.7 26.0 - 34.0 pg   MCHC 31.9 30.0 - 36.0 g/dL   RDW 15.4 11.5 - 15.5 %    Platelets 189 150 - 400 K/uL  Basic metabolic panel     Status: Abnormal   Collection Time: 11/23/16  7:03 AM  Result Value Ref Range   Sodium 142 135 - 145 mmol/L   Potassium 3.3 (L) 3.5 - 5.1 mmol/L   Chloride 106 101 - 111 mmol/L   CO2 27 22 - 32 mmol/L   Glucose, Bld 103 (H) 65 - 99 mg/dL   BUN 28 (H) 6 - 20 mg/dL   Creatinine, Ser 1.46 (H) 0.44 - 1.00 mg/dL   Calcium 8.0 (L) 8.9 - 10.3 mg/dL   GFR calc non Af Amer 30 (L) >60 mL/min   GFR calc Af Amer 34 (L) >60 mL/min    Comment: (NOTE) The eGFR has been calculated using the CKD EPI equation. This calculation has not been validated in all clinical situations. eGFR's persistently <60 mL/min signify possible Chronic Kidney Disease.    Anion gap 9 5 - 15  Protime-INR     Status: Abnormal   Collection Time: 11/24/16  5:08 AM  Result Value Ref Range   Prothrombin Time 36.7 (H) 11.4 - 15.2 seconds   INR 3.74     Imaging / Studies: Dg Chest Port 1 View  Result Date: 11/22/2016 CLINICAL DATA:  Short of breath EXAM: PORTABLE CHEST 1 VIEW COMPARISON:  Set radiograph 11/21/2016 FINDINGS: NG tube in stomach. Bibasilar effusions and atelectasis. Upper lungs are clear. IMPRESSION: No interval change. Bibasilar atelectasis and effusions. Electronically Signed   By: Suzy Bouchard M.D.   On: 11/22/2016 19:54    Medications / Allergies: per chart  Antibiotics: Anti-infectives (From admission, onward)   Start     Dose/Rate Route Frequency Ordered Stop   11/21/16 1600  piperacillin-tazobactam (ZOSYN) IVPB 3.375 g     3.375 g 12.5 mL/hr over 240 Minutes Intravenous Every 8 hours 11/21/16 1044     11/21/16 0815  piperacillin-tazobactam (ZOSYN) IVPB 3.375 g     3.375 g 100 mL/hr over 30 Minutes Intravenous  Once 11/21/16 0808 11/21/16 1318        Note: Portions of this report may have been transcribed using voice recognition software. Every effort was made to ensure accuracy; however, inadvertent computerized transcription errors  may be present.   Any transcriptional errors that result from this process are unintentional.     Adin Hector, M.D., F.A.C.S. Gastrointestinal and Minimally Invasive Surgery [facility] [address], [place], [place] [address] [phone] Main / Paging   11/24/2016

## 2016-11-25 ENCOUNTER — Telehealth: Payer: Self-pay

## 2016-11-25 DIAGNOSIS — M7989 Other specified soft tissue disorders: Secondary | ICD-10-CM

## 2016-11-25 DIAGNOSIS — Z7901 Long term (current) use of anticoagulants: Secondary | ICD-10-CM

## 2016-11-25 LAB — BASIC METABOLIC PANEL
ANION GAP: 8 (ref 5–15)
BUN: 21 mg/dL — ABNORMAL HIGH (ref 6–20)
CALCIUM: 8.4 mg/dL — AB (ref 8.9–10.3)
CO2: 29 mmol/L (ref 22–32)
CREATININE: 1.2 mg/dL — AB (ref 0.44–1.00)
Chloride: 104 mmol/L (ref 101–111)
GFR, EST AFRICAN AMERICAN: 43 mL/min — AB (ref >60)
GFR, EST NON AFRICAN AMERICAN: 37 mL/min — AB (ref >60)
GLUCOSE: 104 mg/dL — AB (ref 65–99)
Potassium: 3.5 mmol/L (ref 3.5–5.1)
Sodium: 141 mmol/L (ref 135–145)

## 2016-11-25 LAB — CBC
HCT: 36.5 % (ref 36.0–46.0)
Hemoglobin: 11.6 g/dL — ABNORMAL LOW (ref 12.0–15.0)
MCH: 30.1 pg (ref 26.0–34.0)
MCHC: 31.8 g/dL (ref 30.0–36.0)
MCV: 94.6 fL (ref 78.0–100.0)
PLATELETS: 212 10*3/uL (ref 150–400)
RBC: 3.86 MIL/uL — ABNORMAL LOW (ref 3.87–5.11)
RDW: 14.8 % (ref 11.5–15.5)
WBC: 9.2 10*3/uL (ref 4.0–10.5)

## 2016-11-25 LAB — PROTIME-INR
INR: 3.31
Prothrombin Time: 33.4 seconds — ABNORMAL HIGH (ref 11.4–15.2)

## 2016-11-25 MED ORDER — AMOXICILLIN-POT CLAVULANATE 500-125 MG PO TABS: 500.0000 mg | ORAL_TABLET | Freq: Two times a day (BID) | ORAL | 0 refills | Status: DC

## 2016-11-25 MED ORDER — PREDNISOLONE 5 MG PO TABS
5.0000 mg | ORAL_TABLET | Freq: Every day | ORAL | Status: DC
  Administered 2016-11-25: 5 mg via ORAL
  Filled 2016-11-25 (×2): qty 1

## 2016-11-25 MED ORDER — GUAIFENESIN-DM 100-10 MG/5ML PO SYRP: 10.0000 mL | ORAL_SOLUTION | ORAL | 0 refills | Status: DC | PRN

## 2016-11-25 MED ORDER — ENSURE SURGERY PO LIQD: 237.0000 mL | Freq: Two times a day (BID) | ORAL | Status: DC

## 2016-11-25 MED ORDER — WARFARIN SODIUM 2.5 MG PO TABS: ORAL_TABLET | ORAL | 3 refills | Status: DC

## 2016-11-25 MED ORDER — AMIODARONE HCL 200 MG PO TABS
100.0000 mg | ORAL_TABLET | Freq: Every day | ORAL | Status: DC
  Administered 2016-11-25: 100 mg via ORAL
  Filled 2016-11-25 (×2): qty 1

## 2016-11-25 NOTE — Care Management Note (Signed)
Case Management Note  Patient Details  Name: [name] MRN: [id number] Date of Birth: May 26, 1920  Subjective/Objective:  PT/OT recc HHC. Patient deferred to dtrs for [facility] choice-chose [facility] HHRN/PT/OT ordered. [facility] rep Santiago Glad aware of [place] & d/c today. dtr [name] the primary contact person c#[phone].No further CM needs.                  Action/Plan:d/c home w/HHC.   Expected Discharge Date:  (unknown)               Expected Discharge Plan:  [place]  In-House Referral:     Discharge planning Services  CM Consult  Post Acute Care Choice:    Choice offered to:  Patient, Adult Children  DME Arranged:    DME Agency:     HH Arranged:  RN, PT, OT HH Agency:  [facility]  Status of Service:  Completed, signed off  If discussed at [place] of Stay Meetings, dates discussed:    Additional Comments:  Dessa Phi, RN 11/25/2016, 11:16 AM

## 2016-11-25 NOTE — Telephone Encounter (Signed)
I called pt's daughter to discuss results. No answer, left a message asking her to call me back.

## 2016-11-25 NOTE — Final Consult Note (Signed)
Consultant Final Sign-Off Note    Assessment/Final recommendations  [name] is a 83 y.o. female followed by [facility] for SBO   Wound care (if applicable):    Diet at discharge: per primary team   Activity at discharge: per primary team   Follow-up appointment:  As needed   Pending results:  Unresulted Labs (From admission, onward)   Start     Ordered   11/22/16 0500  Protime-INR  Daily,   R     11/21/16 1048       Medication recommendations:   Other recommendations:    Thank you for allowing [place] to participate in the care of your patient!  Please consult [place] again if you have further needs for your patient.  Wasif Simonich C. 59/16/3846 6:59 AM    Subjective  Pt tolerating a diet and having bowel function, denies abd pain   Objective  Vital signs in last 24 hours: Temp:  [97.7 F (36.5 C)-99 F (37.2 C)] 99 F (37.2 C) (11/12 0628) Pulse Rate:  [67-76] 76 (11/12 0628) Resp:  [14-18] 14 (11/12 0628) BP: (160-162)/(58-67) 162/59 (11/12 0628) SpO2:  [90 %-96 %] 90 % (11/12 0628)  General: Alert, NAD Abd: soft   Pertinent labs and Studies: Recent Labs    11/23/16 0703 11/25/16 0520  WBC 12.5* 9.2  HGB 11.5* 11.6*  HCT 36.1 36.5   BMET Recent Labs    11/24/16 1709 11/25/16 0520  NA 139 141  K 3.4* 3.5  CL 100* 104  CO2 28 29  GLUCOSE 114* 104*  BUN 20 21*  CREATININE 1.48* 1.20*  CALCIUM 8.6* 8.4*   No results for input(s): LABURIN in the last 72 hours. Results for orders placed or performed during the hospital encounter of 11/21/16  Blood culture (routine x 2)     Status: None (Preliminary result)   Collection Time: 11/21/16  9:11 AM  Result Value Ref Range Status   Specimen Description BLOOD RIGHT ANTECUBITAL  Final   Special Requests   Final    BOTTLES DRAWN AEROBIC AND ANAEROBIC Blood Culture adequate volume   Culture   Final    NO GROWTH 3 DAYS Performed at [facility], [address] [place], [place] [address]    Report Status PENDING  Incomplete  Blood culture (routine x 2)     Status: None (Preliminary result)   Collection Time: 11/21/16  9:55 AM  Result Value Ref Range Status   Specimen Description BLOOD LEFT ANTECUBITAL  Final   Special Requests IN PEDIATRIC BOTTLE Blood Culture adequate volume  Final   Culture   Final    NO GROWTH 3 DAYS Performed at [facility], [place] [address] [place], [place] [address]    Report Status PENDING  Incomplete    Imaging: Dg Chest 2 View  Result Date: 11/24/2016 CLINICAL DATA:  83 y/o F; shortness of breath and pneumonia. Atrial fibrillation. EXAM: CHEST  2 VIEW COMPARISON:  11/22/2016 chest radiograph FINDINGS: Stable cardiac silhouette. Aortic atherosclerosis with calcification. Improved aeration of lung bases with residual bibasilar opacities. Stable reticular markings probably representing interstitial edema. Diminished small bilateral pleural effusions. No acute osseous abnormality is evident. Bilateral acromioclavicular osteoarthrosis with productive changes and thoracic spine spondylosis. IMPRESSION: Interval diminution in small bilateral pleural effusions and bibasilar opacities which may represent associated atelectasis or pneumonia. Mild interstitial edema. Electronically Signed   By: Kristine Garbe M.D.   On: 11/24/2016 18:29

## 2016-11-25 NOTE — Telephone Encounter (Signed)
-----   Message from Star Age, MD sent at 11/22/2016  1:35 PM EST ----- MRI brain wo contrast from 11/20/16 shows age appropriate changes and no new findings; actually quite stable since last exam from Apr 2016, which is actually very reassuring.  As discussed would recommend PCP FU and consider seeing ENT for dizziness.   Please notify pt or daughter. thx  Star Age, MD, PhD [facility])

## 2016-11-25 NOTE — Progress Notes (Signed)
ANTICOAGULATION CONSULT NOTE - Follow Up Consult  Pharmacy Consult for Lovenox Indication: atrial fibrillation, pulmonary embolus and DVT (warfarin held)  Allergies  Allergen Reactions  . Cefdinir     Caused bad diarrhea  . Cortisone     Flushed hands and face   . Prednisone     cuased blood clots Can have very small dose  . Sulfasalazine     Nausea side effects  . Sulfonamide Derivatives     Stomach pains and cramping     Patient Measurements: Height: 5\' 4"  (162.6 cm) Weight: 144 lb 6.4 oz (65.5 kg) IBW/kg (Calculated) : 54.7  Vital Signs: Temp: 99 F (37.2 C) (11/12 0628) Temp Source: Oral (11/12 0628) BP: 162/59 (11/12 0628) Pulse Rate: 76 (11/12 0628)  Labs: Recent Labs    11/23/16 0703 11/24/16 0508 11/24/16 1709 11/25/16 0520  HGB 11.5*  --   --  11.6*  HCT 36.1  --   --  36.5  PLT 189  --   --  212  LABPROT 35.5* 36.7*  --  33.4*  INR 3.58 3.74  --  3.31  CREATININE 1.46*  --  1.48* 1.20*    Estimated Creatinine Clearance: 24.8 mL/min (A) (by C-G formula based on SCr of 1.2 mg/dL (H)).  Assessment: 41 yoF admitted on 11/8 with partial SBO, possible IAI or pneumonia.  PMH significant for history of Afib and DVT/PE s/p IVC Filter.  Pharmacy consulted to start [place] when INR < 2.  Coumadin clinic pt w/ last OP INR 3.0 on 11/12/16. Home dose 2.5 mg daily with last dose 11/7 at 1800.  Admission INR is 2.5  Today, 11/25/2016: INR  3.3, remains elevated despite no warfarin doses since admission.  Last dose taken at home on 11/7. CBC: Hgb low/stable at 11.6, Plt remain WNL No bleeding or complications noted.   Goal of Therapy:  INR 2-3 Monitor platelets by anticoagulation protocol: Yes   Plan:  Continue to hold oral / parenteral anticoagulants d/t elevated INR. Recommend holding warfarin at discharge and having INR rechecked as an outpatient on 11/14 or 11/5 prior to restarting warfarin at a lower dose.  Gretta Arab PharmD, BCPS Pager  [phone] 11/25/2016 11:20 AM

## 2016-11-26 LAB — CULTURE, BLOOD (ROUTINE X 2)
CULTURE: NO GROWTH
Culture: NO GROWTH
SPECIAL REQUESTS: ADEQUATE
Special Requests: ADEQUATE

## 2016-11-26 NOTE — Telephone Encounter (Signed)
[name], [name], returned my call. I advised her that the pt's MRI brain showed age appropriate changes and no new findings and is quite stable from last exam in 2016, which is reassuring. I advised her that Dr. Rexene Alberts recommends a PCP follow up and seeing an ENT for dizziness. [name] reports that pt has seen an ENT and the ENT could not find a reason for her dizziness. [name] verbalized understanding of results and had no questions at this time but was encouraged to call back if questions arise.

## 2016-11-26 NOTE — Discharge Summary (Signed)
Physician Discharge Summary  [name] VFI:[id number] DOB: 09-Feb-1920 DOA: 11/21/2016  PCP: Antony Contras, MD  Admit date: 11/21/2016 Discharge date: 11/26/2016  Admitted From: Home.  Disposition: Home.   Recommendations for Outpatient Follow-up:  1. Follow up with PCP in 1-2 weeks 2. Please obtain BMP/CBC in one week 3. Please follow up with Dr [name] as recommended.   Home Health:yes   Discharge Condition:GUARDED.  CODE STATUS: DNR Diet recommendation: Heart Healthy   Brief/Interim Summary:  [name] a 83 y.o.femalewith medical history significant ofrecurrent small bowel obstructions, hypothyroid, paroxysmal atrial fibrillation, prior history of pulmonary embolism with IVC filter placement, CK D, chronic diastolic CHF who presents to the hospital with acute onset abdominal pain and distention started on the evening prior to hospital admission.    Discharge Diagnoses:  Principal Problem:   SBO (small bowel obstruction) (HCC) Active Problems:   Arthropathy   Leukocytosis   CKD (chronic kidney disease), stage IV (HCC)   Aspiration pneumonia (HCC)   Chronic diastolic heart failure (HCC)   Anticoagulated on warfarin  SBO:  Appears to have resolved, she reports  and passing flatus and is able to tolerate clear liquids. SURGERY consulted and recommendations given.  NG tube removed by surgery and advance diet as tolerated and as recommended by surgery.  Currently on dysphagia diet, continue the same.    Nausea and vomiting : symptomatic management, improved.    Aspiration pneumonia:  On IV antibiotics transitioned to oral augmentin to complete the course. Afebrile and improving leukocytosis.  CXR shows basilar effusions.  Weaned her off the oxygen. She reports some dry cough possibly from pneumonitis.  Repeat CXR in am, improvement in the  aspiration pneumonia and  basilar effusions.   Atrial fibrillation;  Rate controlled. Resume coumadin once INR is  between 2 to 3.   Left arm swelling:  Swelling improved and ruled out DVT.   Chronic diastolic CHF:  She does not appear to be overtly fluid overloaded, though reports symptomatic relief with IV lasix.   Echocardiogram ordered and reviewed. Recommended outpatient follow upwith Dr [name].  Prn lasix as needed for fluid overload.    Hypertension: resume home meds.    acute on Stage 3 CKD:  Creatinine improved with diuresis.   Elevated lactic acid:  Back to normal today.   Hypokalemia replaced.    Normocytic anemia: Stable.       Discharge Instructions  Discharge Instructions    Diet - low sodium heart healthy   Complete by:  As directed    Increase activity slowly   Complete by:  As directed      Allergies as of 11/25/2016      Reactions   Cefdinir    Caused bad diarrhea   Cortisone    Flushed hands and face   Prednisone    cuased blood clots Can have very small dose   Sulfasalazine    Nausea side effects   Sulfonamide Derivatives    Stomach pains and cramping      Medication List    TAKE these medications   amiodarone 200 MG tablet Commonly known as:  PACERONE Take 0.5 tablets (100 mg total) by mouth daily.   amoxicillin-clavulanate 500-125 MG tablet Commonly known as:  AUGMENTIN Take 1 tablet (500 mg total) 2 (two) times daily by mouth.   CALCIUM 600 + D 600-200 MG-UNIT Tabs Generic drug:  Calcium Carb-Cholecalciferol Take 1 tablet by mouth 3 (three) times daily.   feeding supplement Liqd  Take 237 mLs 2 (two) times daily between meals by mouth.   GLUCOSAMINE CHONDROITIN COMPLX Tabs Take 1 tablet by mouth 2 (two) times daily.   guaiFENesin-dextromethorphan 100-10 MG/5ML syrup Commonly known as:  ROBITUSSIN DM Take 10 mLs every 4 (four) hours as needed by mouth for cough (chest congestion).   levothyroxine 150 MCG tablet Commonly known as:  SYNTHROID, LEVOTHROID Take 150 mcg by mouth daily before breakfast.   OCUVITE ADULT 50+  PO Take 1 tablet by mouth daily.   prednisoLONE 5 MG Tabs tablet Take 5 mg by mouth daily.   warfarin 2.5 MG tablet Commonly known as:  COUMADIN Take as directed. If you are unsure how to take this medication, talk to your nurse or doctor. Original instructions:  Take 1 tablet by mouth daily or as directed by coumadin clinic.  HOLD tonight's dose , recheck INR in 1 to2 days and resume coumadin as per the INR. What changed:  additional instructions      Follow-up Information    [facility] Follow up.   Specialty:  Home Health Services Why:  [facility] physical/occupational therapy [place] nursing. Contact information: [address] [place] [address] [phone]        Antony Contras, MD. Schedule an appointment as soon as possible for a visit in 1 week(s).   Specialty:  Family Medicine Contact information: [address] [place] [address] [phone]          Allergies  Allergen Reactions  . Cefdinir     Caused bad diarrhea  . Cortisone     Flushed hands and face   . Prednisone     cuased blood clots Can have very small dose  . Sulfasalazine     Nausea side effects  . Sulfonamide Derivatives     Stomach pains and cramping     Consultations:  Surgery.    Procedures/Studies: Dg Chest 2 View  Result Date: 11/24/2016 CLINICAL DATA:  83 y/o F; shortness of breath and pneumonia. Atrial fibrillation. EXAM: CHEST  2 VIEW COMPARISON:  11/22/2016 chest radiograph FINDINGS: Stable cardiac silhouette. Aortic atherosclerosis with calcification. Improved aeration of lung bases with residual bibasilar opacities. Stable reticular markings probably representing interstitial edema. Diminished small bilateral pleural effusions. No acute osseous abnormality is evident. Bilateral acromioclavicular osteoarthrosis with productive changes and thoracic spine spondylosis. IMPRESSION: Interval diminution in small bilateral pleural effusions and  bibasilar opacities which may represent associated atelectasis or pneumonia. Mild interstitial edema. Electronically Signed   By: Kristine Garbe M.D.   On: 11/24/2016 18:29   Dg Abd 1 View  Result Date: 11/22/2016 CLINICAL DATA:  Repositioned NG tube EXAM: ABDOMEN - 1 VIEW COMPARISON:  11/21/2016 FINDINGS: The enteric tube has been advanced. Tip is now in the left upper quadrant consistent with location in the body of the stomach. Proximal side hole projects over the upper stomach. Residual contrast material in the colon. Atelectasis in the lung bases. IVC filter present. Thoracolumbar scoliosis convex towards the left. Degenerative changes in the spine. IMPRESSION: Enteric tube is been advanced with tip now projected over the body of the stomach. Electronically Signed   By: Lucienne Capers M.D.   On: 11/22/2016 05:33   Mr Brain Wo Contrast  Result Date: 11/22/2016  [facility] [address] [place], [place] [address] [phone] NEUROIMAGING REPORT STUDY DATE:11/20/2016 PATIENT NAME: ALVIE SPELTZ DOB: 08/03/1920 MRN: [id number] ORDERING CLINICIAN: Dr Rexene Alberts CLINICAL HISTORY:  70 year patient  with dizziness COMPARISON FILMS:  MRI Brain wo 04/26/2014 EXAM: MRI Brain wo TECHNIQUE:  MRI of the brain without contrast was obtained utilizing 5 mm axial slices with T1, T2, T2 flair, T2 star gradient echo and diffusion weighted views.  T1 sagittal and T2 coronal views were obtained. CONTRAST: none IMAGING SITE: [facility] FINDINGS: Brain parenchyma shows mild age-appropriate changes of chronic microvascular ischemia and generalized degree of cerebral atrophy.  No structural lesion, tumor or infarcts are noted.  There are mild changes of chronic paranasal sinusitis.  No other structural lesion, tumor infarcts are seen.No abnormal lesions are seen on diffusion-weighted views to suggest acute ischemia. The cortical sulci, fissures and cisterns are normal in size and  appearance. Lateral, third and fourth ventricle are normal in size and appearance. No extra-axial fluid collections are seen. No evidence of mass effect or midline shift.  On sagittal views the posterior fossa, pituitary gland and corpus callosum are unremarkable. No evidence of intracranial hemorrhage on gradient-echo views. The orbits and their contents, paranasal sinuses and calvarium are unremarkable.  Intracranial flow voids are present.   Slightly abnormal MRI scan showing age-appropriate changes of chronic microvascular ischemia and generalized cerebral atrophy.  No acute abnormality.  No significant change compared with previous MRI dated 04/26/2014 INTERPRETING PHYSICIAN: PRAMOD SETHI, MD Certified in  [place] by [place] [employer] and [employer]   Ct Abdomen Pelvis W Contrast  Result Date: 11/21/2016 CLINICAL DATA:  Abdominal pain for several hours, history of bowel obstruction EXAM: CT ABDOMEN AND PELVIS WITH CONTRAST TECHNIQUE: Multidetector CT imaging of the abdomen and pelvis was performed using the standard protocol following bolus administration of intravenous contrast. CONTRAST:  26mL ISOVUE-300 IOPAMIDOL (ISOVUE-300) INJECTION 61% COMPARISON:  03/25/2015 FINDINGS: Lower chest: Lung bases demonstrate patchy airspace consolidation particularly in the right middle and left lower lobes. The left basilar changes are more chronic in nature Hepatobiliary: No focal liver abnormality is seen. No gallstones, gallbladder wall thickening, or biliary dilatation. Pancreas: Pancreas is diffusely fatty infiltrated. No mass lesion is seen. Spleen: Normal in size without focal abnormality. Adrenals/Urinary Tract: The adrenal glands are within normal limits. The kidneys show no mass lesion or hydronephrosis. Delayed images demonstrate normal excretion. The bladder is partially distended. Stomach/Bowel: Diffuse diverticular change of the colon is seen  without evidence of diverticulitis. The appendix is been surgically removed. Multiple dilated loops of small bowel beginning in the jejunum are noted. This extends to the midportion of the abdomen likely at the junction of the ileum and jejunum were there is an abrupt transition zone identified. This is best seen on image number 59 of series 2 and image number 41 of series 4. This is similar location to the area of previous partial small bowel obstruction. The more distal small bowel is within normal limits. Is cecal junction is unremarkable. Vascular/Lymphatic: IVC filter is noted in place. Atherosclerotic changes of the aorta are noted without definitive aneurysmal dilatation. No significant lymphadenopathy is noted. Reproductive: Uterus and bilateral adnexa are unremarkable. Other: Minimal free fluid is noted within the abdomen. No findings to suggest perforation seen. Musculoskeletal: Degenerative changes of the lumbar spine are noted. IMPRESSION: Changes consistent with partial small-bowel obstruction noted at the junction of the jejunum and ileum in the mid abdomen. This is likely related to adhesions. No definitive internal hernia is identified. Increase in right basilar consolidation involving the right middle lobe. Electronically Signed   By: Inez Catalina M.D.   On: 11/21/2016 09:05  Dg Chest Port 1 View  Result Date: 11/22/2016 CLINICAL DATA:  Short of breath EXAM: PORTABLE CHEST 1 VIEW COMPARISON:  Set radiograph 11/21/2016 FINDINGS: NG tube in stomach. Bibasilar effusions and atelectasis. Upper lungs are clear. IMPRESSION: No interval change. Bibasilar atelectasis and effusions. Electronically Signed   By: Suzy Bouchard M.D.   On: 11/22/2016 19:54   Dg Chest Port 1 View  Result Date: 11/21/2016 CLINICAL DATA:  SBO, ng tube, abd distention, some mid chest discomfort with possible infiltrate seen on ct abd today, ? Aspiration pneumonia EXAM: PORTABLE CHEST 1 VIEW COMPARISON:  01/22/2016  FINDINGS: Nasogastric tube is in place, tip off the image field, beyond the gastroesophageal junction. The heart size is accentuated by the technique. There is new opacity at the right lung base which obscures the hemidiaphragm. There is minimal left lower lobe atelectasis. IMPRESSION: 1. Bibasilar opacities, right greater than left. 2. Nasogastric tube beyond the image beyond the gastroesophageal junction. Electronically Signed   By: Nolon Nations M.D.   On: 11/21/2016 12:44   Dg Abd Portable 1v-small Bowel Obstruction Protocol-initial, 8 Hr Delay  Result Date: 11/21/2016 CLINICAL DATA:  Small-bowel protocol.  8 hour post contrast image. EXAM: PORTABLE ABDOMEN - 1 VIEW COMPARISON:  11/21/2016 FINDINGS: Limited field of view study does not include the entire abdomen. An enteric tube is present with tip in the left upper quadrant consistent with location in the upper stomach. The proximal side hole is projected at or just above the expected location of the EG junction. Consider advancement. Diffusely dilated small bowel. Dilute contrast material within the small bowel. No contrast material is definitively identified in the colon although much of the colon is off the field of view. Inferior vena caval filter. Thoracolumbar scoliosis convex towards the left. Degenerative changes in the spine. IMPRESSION: 1. Enteric tube tip appears to be in the upper stomach with proximal side hole probably in the distal esophagus. Advancement is suggested. 2. Gas distended small bowel consistent with small bowel obstruction. No contrast material in the colon suggesting high-grade obstruction. 3. Degenerative changes and scoliosis of the lumbar spine. Electronically Signed   By: Lucienne Capers M.D.   On: 11/21/2016 23:47   Dg Abd Portable 1v-small Bowel Protocol-position Verification  Result Date: 11/21/2016 CLINICAL DATA:  NG tube placement for small bowel protocol, Loma Sousa RN given instructions. EXAM: PORTABLE ABDOMEN -  1 VIEW COMPARISON:  CT of the abdomen and pelvis 11/21/2016 FINDINGS: Nasogastric tube is in place, tip overlying the level of the stomach. There is elevation of right hemidiaphragm. Persistently dilated loops of small bowel identified within the abdomen. No evidence for free intraperitoneal air. There is airspace filling of opacity within the lower lobes bilaterally. IVC catheter in place, the apex overlying L4. IMPRESSION: Interval placement of nasogastric tube. Small bowel obstruction. Bibasilar airspace filling opacities. Electronically Signed   By: Nolon Nations M.D.   On: 11/21/2016 11:38   [place] Lt Upper Extrem Ltd Soft Tissue Non Vascular  Result Date: 11/21/2016 CLINICAL DATA:  Left forearm swelling for 3 weeks. EXAM: ULTRASOUND left UPPER EXTREMITY LIMITED TECHNIQUE: Ultrasound examination of the upper extremity soft tissues was performed in the area of clinical concern. COMPARISON:  None FINDINGS: Expected muscular and subcutaneous tissues are encounter it in the region of concern, without focal fluid collection or obvious mass. IMPRESSION: 1. No significant sonographic abnormality. If symptoms persists despite conservative therapy, MRI may be warranted for further characterization. Electronically Signed   By: Cindra Eves.D.  On: 11/21/2016 12:05       Subjective: No new complaints.   Discharge Exam: Vitals:   11/25/16 1300 11/25/16 1520  BP: (!) 196/74 (!) 158/64  Pulse: 80 64  Resp:    Temp:    SpO2:     Vitals:   11/24/16 2150 11/25/16 0628 11/25/16 1300 11/25/16 1520  BP: (!) 160/67 (!) 162/59 (!) 196/74 (!) 158/64  Pulse: 67 76 80 64  Resp: 18 14    Temp: 98.6 F (37 C) 99 F (37.2 C)    TempSrc: Oral Oral    SpO2: 92% 90%    Weight:      Height:        General: Pt is alert, awake, not in acute distress Cardiovascular: RRR, S1/S2 +, no rubs, no gallops Respiratory: CTA bilaterally, no wheezing, no rhonchi Abdominal: Soft, NT, ND, bowel sounds  + Extremities: no edema, no cyanosis    The results of significant diagnostics from this hospitalization (including imaging, microbiology, ancillary and laboratory) are listed below for reference.     Microbiology: Recent Results (from the past 240 hour(s))  Blood culture (routine x 2)     Status: None   Collection Time: 11/21/16  9:11 AM  Result Value Ref Range Status   Specimen Description BLOOD RIGHT ANTECUBITAL  Final   Special Requests   Final    BOTTLES DRAWN AEROBIC AND ANAEROBIC Blood Culture adequate volume   Culture   Final    NO GROWTH 5 DAYS Performed at [facility], [address], [place], [place] [address]    Report Status 11/26/2016 FINAL  Final  Blood culture (routine x 2)     Status: None   Collection Time: 11/21/16  9:55 AM  Result Value Ref Range Status   Specimen Description BLOOD LEFT ANTECUBITAL  Final   Special Requests IN PEDIATRIC BOTTLE Blood Culture adequate volume  Final   Culture   Final    NO GROWTH 5 DAYS Performed at [facility], [place] [address], [place], [place] [address]    Report Status 11/26/2016 FINAL  Final     Labs: BNP (last 3 results) No results for input(s): BNP in the last 8760 hours. Basic Metabolic Panel: Recent Labs  Lab 11/21/16 0614 11/21/16 0955 11/22/16 0547 11/23/16 0703 11/24/16 1709 11/25/16 0520  NA 142  --  144 142 139 141  K 4.0  --  3.9 3.3* 3.4* 3.5  CL 105  --  110 106 100* 104  CO2 26  --  26 27 28 29   GLUCOSE 151*  --  137* 103* 114* 104*  BUN 38*  --  33* 28* 20 21*  CREATININE 1.35*  --  1.35* 1.46* 1.48* 1.20*  CALCIUM 9.3  --  7.9* 8.0* 8.6* 8.4*  MG  --  1.9  --   --   --   --    Liver Function Tests: Recent Labs  Lab 11/21/16 0614 11/22/16 0547  AST 31 29  ALT 18 26  ALKPHOS 71 38  BILITOT 1.0 1.1  PROT 6.5 5.1*  ALBUMIN 4.3 2.8*   Recent Labs  Lab 11/21/16 0614  LIPASE 21   No results for input(s): AMMONIA in the last 168 hours. CBC: Recent Labs  Lab  11/21/16 0614 11/22/16 0547 11/23/16 0703 11/25/16 0520  WBC 21.3* 12.8* 12.5* 9.2  HGB 13.9 11.3* 11.5* 11.6*  HCT 41.9 35.2* 36.1 36.5  MCV 94.8 95.1 96.3 94.6  PLT 235  191 189 212   Cardiac Enzymes: Recent Labs  Lab 11/21/16 0955 11/21/16 1730 11/21/16 2314  TROPONINI 0.04* 0.04* 0.04*   BNP: Invalid input(s): POCBNP CBG: No results for input(s): GLUCAP in the last 168 hours. D-Dimer No results for input(s): DDIMER in the last 72 hours. Hgb A1c No results for input(s): HGBA1C in the last 72 hours. Lipid Profile No results for input(s): CHOL, HDL, LDLCALC, TRIG, CHOLHDL, LDLDIRECT in the last 72 hours. Thyroid function studies No results for input(s): TSH, T4TOTAL, T3FREE, THYROIDAB in the last 72 hours.  Invalid input(s): FREET3 Anemia work up No results for input(s): VITAMINB12, FOLATE, FERRITIN, TIBC, IRON, RETICCTPCT in the last 72 hours. Urinalysis    Component Value Date/Time   COLORURINE YELLOW 11/21/2016 1030   APPEARANCEUR CLEAR 11/21/2016 1030   LABSPEC 1.043 (H) 11/21/2016 1030   PHURINE 7.0 11/21/2016 1030   GLUCOSEU NEGATIVE 11/21/2016 1030   HGBUR NEGATIVE 11/21/2016 1030   BILIRUBINUR NEGATIVE 11/21/2016 1030   KETONESUR NEGATIVE 11/21/2016 1030   PROTEINUR NEGATIVE 11/21/2016 1030   UROBILINOGEN 0.2 01/17/2013 0730   NITRITE POSITIVE (A) 11/21/2016 1030   LEUKOCYTESUR NEGATIVE 11/21/2016 1030   Sepsis Labs Invalid input(s): PROCALCITONIN,  WBC,  LACTICIDVEN Microbiology Recent Results (from the past 240 hour(s))  Blood culture (routine x 2)     Status: None   Collection Time: 11/21/16  9:11 AM  Result Value Ref Range Status   Specimen Description BLOOD RIGHT ANTECUBITAL  Final   Special Requests   Final    BOTTLES DRAWN AEROBIC AND ANAEROBIC Blood Culture adequate volume   Culture   Final    NO GROWTH 5 DAYS Performed at [facility], [place] [address], [place], [place] [address]    Report Status 11/26/2016 FINAL  Final  Blood  culture (routine x 2)     Status: None   Collection Time: 11/21/16  9:55 AM  Result Value Ref Range Status   Specimen Description BLOOD LEFT ANTECUBITAL  Final   Special Requests IN PEDIATRIC BOTTLE Blood Culture adequate volume  Final   Culture   Final    NO GROWTH 5 DAYS Performed at [facility], [place] [address] [place], [place] [address]    Report Status 11/26/2016 FINAL  Final     Time coordinating discharge: Over 30 minutes  SIGNED:   Hosie Poisson, MD  [facility] 11/26/2016, 11:04 AM Pager   If 7PM-7AM, please contact night-coverage [email or web address] Password [email or web address]

## 2016-11-28 ENCOUNTER — Ambulatory Visit (INDEPENDENT_AMBULATORY_CARE_PROVIDER_SITE_OTHER): Payer: Medicare Other | Admitting: *Deleted

## 2016-11-28 DIAGNOSIS — I480 Paroxysmal atrial fibrillation: Secondary | ICD-10-CM | POA: Diagnosis not present

## 2016-11-28 DIAGNOSIS — Z5181 Encounter for therapeutic drug level monitoring: Secondary | ICD-10-CM | POA: Diagnosis not present

## 2016-11-28 LAB — POCT INR: INR: 1.7

## 2016-11-28 NOTE — Patient Instructions (Addendum)
Today Nov 15th take 1 and 1/2 tablets , then  continue on same dosage 1 tablet daily.  Recheck INR Nov 26th  Call with any new medications or procedures: [facility] [phone]  Spoke with Barnett Applebaum with [facility] and order given to recheck INR on 12/09/2016 do finger stick if new strips available if not do venipuncture

## 2016-11-29 DIAGNOSIS — J690 Pneumonitis due to inhalation of food and vomit: Secondary | ICD-10-CM | POA: Diagnosis not present

## 2016-11-29 DIAGNOSIS — D649 Anemia, unspecified: Secondary | ICD-10-CM | POA: Diagnosis not present

## 2016-11-29 DIAGNOSIS — I509 Heart failure, unspecified: Secondary | ICD-10-CM | POA: Diagnosis not present

## 2016-11-29 DIAGNOSIS — K529 Noninfective gastroenteritis and colitis, unspecified: Secondary | ICD-10-CM | POA: Diagnosis not present

## 2016-11-29 DIAGNOSIS — E039 Hypothyroidism, unspecified: Secondary | ICD-10-CM | POA: Diagnosis not present

## 2016-11-29 DIAGNOSIS — M069 Rheumatoid arthritis, unspecified: Secondary | ICD-10-CM | POA: Diagnosis not present

## 2016-11-29 DIAGNOSIS — N183 Chronic kidney disease, stage 3 (moderate): Secondary | ICD-10-CM | POA: Diagnosis not present

## 2016-11-29 DIAGNOSIS — E782 Mixed hyperlipidemia: Secondary | ICD-10-CM | POA: Diagnosis not present

## 2016-11-29 DIAGNOSIS — I4891 Unspecified atrial fibrillation: Secondary | ICD-10-CM | POA: Diagnosis not present

## 2016-11-29 DIAGNOSIS — K56609 Unspecified intestinal obstruction, unspecified as to partial versus complete obstruction: Secondary | ICD-10-CM | POA: Diagnosis not present

## 2016-11-30 DIAGNOSIS — K56609 Unspecified intestinal obstruction, unspecified as to partial versus complete obstruction: Secondary | ICD-10-CM | POA: Diagnosis not present

## 2016-11-30 DIAGNOSIS — I5032 Chronic diastolic (congestive) heart failure: Secondary | ICD-10-CM | POA: Diagnosis not present

## 2016-11-30 DIAGNOSIS — Z8701 Personal history of pneumonia (recurrent): Secondary | ICD-10-CM | POA: Diagnosis not present

## 2016-11-30 DIAGNOSIS — Z86711 Personal history of pulmonary embolism: Secondary | ICD-10-CM | POA: Diagnosis not present

## 2016-11-30 DIAGNOSIS — Z5181 Encounter for therapeutic drug level monitoring: Secondary | ICD-10-CM | POA: Diagnosis not present

## 2016-11-30 DIAGNOSIS — H548 Legal blindness, as defined in USA: Secondary | ICD-10-CM | POA: Diagnosis not present

## 2016-11-30 DIAGNOSIS — Z7901 Long term (current) use of anticoagulants: Secondary | ICD-10-CM | POA: Diagnosis not present

## 2016-11-30 DIAGNOSIS — N183 Chronic kidney disease, stage 3 (moderate): Secondary | ICD-10-CM | POA: Diagnosis not present

## 2016-11-30 DIAGNOSIS — E039 Hypothyroidism, unspecified: Secondary | ICD-10-CM | POA: Diagnosis not present

## 2016-11-30 DIAGNOSIS — M48061 Spinal stenosis, lumbar region without neurogenic claudication: Secondary | ICD-10-CM | POA: Diagnosis not present

## 2016-11-30 DIAGNOSIS — I480 Paroxysmal atrial fibrillation: Secondary | ICD-10-CM | POA: Diagnosis not present

## 2016-12-02 DIAGNOSIS — I5032 Chronic diastolic (congestive) heart failure: Secondary | ICD-10-CM | POA: Diagnosis not present

## 2016-12-02 DIAGNOSIS — N183 Chronic kidney disease, stage 3 (moderate): Secondary | ICD-10-CM | POA: Diagnosis not present

## 2016-12-02 DIAGNOSIS — I480 Paroxysmal atrial fibrillation: Secondary | ICD-10-CM | POA: Diagnosis not present

## 2016-12-02 DIAGNOSIS — K56609 Unspecified intestinal obstruction, unspecified as to partial versus complete obstruction: Secondary | ICD-10-CM | POA: Diagnosis not present

## 2016-12-02 DIAGNOSIS — M48061 Spinal stenosis, lumbar region without neurogenic claudication: Secondary | ICD-10-CM | POA: Diagnosis not present

## 2016-12-02 DIAGNOSIS — H548 Legal blindness, as defined in USA: Secondary | ICD-10-CM | POA: Diagnosis not present

## 2016-12-04 DIAGNOSIS — K56609 Unspecified intestinal obstruction, unspecified as to partial versus complete obstruction: Secondary | ICD-10-CM | POA: Diagnosis not present

## 2016-12-04 DIAGNOSIS — H548 Legal blindness, as defined in USA: Secondary | ICD-10-CM | POA: Diagnosis not present

## 2016-12-04 DIAGNOSIS — I5032 Chronic diastolic (congestive) heart failure: Secondary | ICD-10-CM | POA: Diagnosis not present

## 2016-12-04 DIAGNOSIS — I480 Paroxysmal atrial fibrillation: Secondary | ICD-10-CM | POA: Diagnosis not present

## 2016-12-04 DIAGNOSIS — M48061 Spinal stenosis, lumbar region without neurogenic claudication: Secondary | ICD-10-CM | POA: Diagnosis not present

## 2016-12-04 DIAGNOSIS — N183 Chronic kidney disease, stage 3 (moderate): Secondary | ICD-10-CM | POA: Diagnosis not present

## 2016-12-09 ENCOUNTER — Ambulatory Visit (INDEPENDENT_AMBULATORY_CARE_PROVIDER_SITE_OTHER): Payer: Medicare Other | Admitting: Internal Medicine

## 2016-12-09 DIAGNOSIS — Z7901 Long term (current) use of anticoagulants: Secondary | ICD-10-CM | POA: Diagnosis not present

## 2016-12-09 DIAGNOSIS — N183 Chronic kidney disease, stage 3 (moderate): Secondary | ICD-10-CM | POA: Diagnosis not present

## 2016-12-09 DIAGNOSIS — Z5181 Encounter for therapeutic drug level monitoring: Secondary | ICD-10-CM

## 2016-12-09 DIAGNOSIS — M48061 Spinal stenosis, lumbar region without neurogenic claudication: Secondary | ICD-10-CM | POA: Diagnosis not present

## 2016-12-09 DIAGNOSIS — I4891 Unspecified atrial fibrillation: Secondary | ICD-10-CM | POA: Diagnosis not present

## 2016-12-09 DIAGNOSIS — I480 Paroxysmal atrial fibrillation: Secondary | ICD-10-CM | POA: Diagnosis not present

## 2016-12-09 DIAGNOSIS — I5032 Chronic diastolic (congestive) heart failure: Secondary | ICD-10-CM | POA: Diagnosis not present

## 2016-12-09 DIAGNOSIS — H548 Legal blindness, as defined in USA: Secondary | ICD-10-CM | POA: Diagnosis not present

## 2016-12-09 DIAGNOSIS — K56609 Unspecified intestinal obstruction, unspecified as to partial versus complete obstruction: Secondary | ICD-10-CM | POA: Diagnosis not present

## 2016-12-09 LAB — PROTIME-INR: INR: 2 — AB (ref <=1.1)

## 2016-12-10 DIAGNOSIS — K56609 Unspecified intestinal obstruction, unspecified as to partial versus complete obstruction: Secondary | ICD-10-CM | POA: Diagnosis not present

## 2016-12-10 DIAGNOSIS — H548 Legal blindness, as defined in USA: Secondary | ICD-10-CM | POA: Diagnosis not present

## 2016-12-10 DIAGNOSIS — M48061 Spinal stenosis, lumbar region without neurogenic claudication: Secondary | ICD-10-CM | POA: Diagnosis not present

## 2016-12-10 DIAGNOSIS — I5032 Chronic diastolic (congestive) heart failure: Secondary | ICD-10-CM | POA: Diagnosis not present

## 2016-12-10 DIAGNOSIS — N183 Chronic kidney disease, stage 3 (moderate): Secondary | ICD-10-CM | POA: Diagnosis not present

## 2016-12-10 DIAGNOSIS — I480 Paroxysmal atrial fibrillation: Secondary | ICD-10-CM | POA: Diagnosis not present

## 2016-12-12 DIAGNOSIS — M48061 Spinal stenosis, lumbar region without neurogenic claudication: Secondary | ICD-10-CM | POA: Diagnosis not present

## 2016-12-12 DIAGNOSIS — I480 Paroxysmal atrial fibrillation: Secondary | ICD-10-CM | POA: Diagnosis not present

## 2016-12-12 DIAGNOSIS — K56609 Unspecified intestinal obstruction, unspecified as to partial versus complete obstruction: Secondary | ICD-10-CM | POA: Diagnosis not present

## 2016-12-12 DIAGNOSIS — I5032 Chronic diastolic (congestive) heart failure: Secondary | ICD-10-CM | POA: Diagnosis not present

## 2016-12-12 DIAGNOSIS — N183 Chronic kidney disease, stage 3 (moderate): Secondary | ICD-10-CM | POA: Diagnosis not present

## 2016-12-12 DIAGNOSIS — H548 Legal blindness, as defined in USA: Secondary | ICD-10-CM | POA: Diagnosis not present

## 2016-12-16 DIAGNOSIS — M48061 Spinal stenosis, lumbar region without neurogenic claudication: Secondary | ICD-10-CM | POA: Diagnosis not present

## 2016-12-16 DIAGNOSIS — I480 Paroxysmal atrial fibrillation: Secondary | ICD-10-CM | POA: Diagnosis not present

## 2016-12-16 DIAGNOSIS — N183 Chronic kidney disease, stage 3 (moderate): Secondary | ICD-10-CM | POA: Diagnosis not present

## 2016-12-16 DIAGNOSIS — K56609 Unspecified intestinal obstruction, unspecified as to partial versus complete obstruction: Secondary | ICD-10-CM | POA: Diagnosis not present

## 2016-12-16 DIAGNOSIS — H548 Legal blindness, as defined in USA: Secondary | ICD-10-CM | POA: Diagnosis not present

## 2016-12-16 DIAGNOSIS — I5032 Chronic diastolic (congestive) heart failure: Secondary | ICD-10-CM | POA: Diagnosis not present

## 2016-12-18 DIAGNOSIS — I480 Paroxysmal atrial fibrillation: Secondary | ICD-10-CM | POA: Diagnosis not present

## 2016-12-18 DIAGNOSIS — M48061 Spinal stenosis, lumbar region without neurogenic claudication: Secondary | ICD-10-CM | POA: Diagnosis not present

## 2016-12-18 DIAGNOSIS — K56609 Unspecified intestinal obstruction, unspecified as to partial versus complete obstruction: Secondary | ICD-10-CM | POA: Diagnosis not present

## 2016-12-18 DIAGNOSIS — H548 Legal blindness, as defined in USA: Secondary | ICD-10-CM | POA: Diagnosis not present

## 2016-12-18 DIAGNOSIS — N183 Chronic kidney disease, stage 3 (moderate): Secondary | ICD-10-CM | POA: Diagnosis not present

## 2016-12-18 DIAGNOSIS — I5032 Chronic diastolic (congestive) heart failure: Secondary | ICD-10-CM | POA: Diagnosis not present

## 2016-12-19 DIAGNOSIS — N183 Chronic kidney disease, stage 3 (moderate): Secondary | ICD-10-CM | POA: Diagnosis not present

## 2016-12-19 DIAGNOSIS — M48061 Spinal stenosis, lumbar region without neurogenic claudication: Secondary | ICD-10-CM | POA: Diagnosis not present

## 2016-12-19 DIAGNOSIS — I480 Paroxysmal atrial fibrillation: Secondary | ICD-10-CM | POA: Diagnosis not present

## 2016-12-19 DIAGNOSIS — I5032 Chronic diastolic (congestive) heart failure: Secondary | ICD-10-CM | POA: Diagnosis not present

## 2016-12-19 DIAGNOSIS — H548 Legal blindness, as defined in USA: Secondary | ICD-10-CM | POA: Diagnosis not present

## 2016-12-19 DIAGNOSIS — K56609 Unspecified intestinal obstruction, unspecified as to partial versus complete obstruction: Secondary | ICD-10-CM | POA: Diagnosis not present

## 2016-12-24 DIAGNOSIS — H548 Legal blindness, as defined in USA: Secondary | ICD-10-CM | POA: Diagnosis not present

## 2016-12-24 DIAGNOSIS — I5032 Chronic diastolic (congestive) heart failure: Secondary | ICD-10-CM | POA: Diagnosis not present

## 2016-12-24 DIAGNOSIS — I480 Paroxysmal atrial fibrillation: Secondary | ICD-10-CM | POA: Diagnosis not present

## 2016-12-24 DIAGNOSIS — K56609 Unspecified intestinal obstruction, unspecified as to partial versus complete obstruction: Secondary | ICD-10-CM | POA: Diagnosis not present

## 2016-12-24 DIAGNOSIS — M48061 Spinal stenosis, lumbar region without neurogenic claudication: Secondary | ICD-10-CM | POA: Diagnosis not present

## 2016-12-24 DIAGNOSIS — N183 Chronic kidney disease, stage 3 (moderate): Secondary | ICD-10-CM | POA: Diagnosis not present

## 2016-12-25 ENCOUNTER — Telehealth: Payer: Self-pay | Admitting: Pharmacist

## 2016-12-25 NOTE — Telephone Encounter (Signed)
LM again to follow up

## 2016-12-25 NOTE — Telephone Encounter (Signed)
[place] nurse called and LM that would get pt INR on Tuesday.   We have not received results.  LMOM with Alexis to call back about results.

## 2016-12-26 ENCOUNTER — Ambulatory Visit (INDEPENDENT_AMBULATORY_CARE_PROVIDER_SITE_OTHER): Payer: Medicare Other

## 2016-12-26 ENCOUNTER — Other Ambulatory Visit: Payer: Self-pay | Admitting: Interventional Cardiology

## 2016-12-26 DIAGNOSIS — N183 Chronic kidney disease, stage 3 (moderate): Secondary | ICD-10-CM | POA: Diagnosis not present

## 2016-12-26 DIAGNOSIS — Z5181 Encounter for therapeutic drug level monitoring: Secondary | ICD-10-CM | POA: Diagnosis not present

## 2016-12-26 DIAGNOSIS — I4891 Unspecified atrial fibrillation: Secondary | ICD-10-CM

## 2016-12-26 DIAGNOSIS — I480 Paroxysmal atrial fibrillation: Secondary | ICD-10-CM | POA: Diagnosis not present

## 2016-12-26 DIAGNOSIS — K56609 Unspecified intestinal obstruction, unspecified as to partial versus complete obstruction: Secondary | ICD-10-CM | POA: Diagnosis not present

## 2016-12-26 DIAGNOSIS — M48061 Spinal stenosis, lumbar region without neurogenic claudication: Secondary | ICD-10-CM | POA: Diagnosis not present

## 2016-12-26 DIAGNOSIS — H548 Legal blindness, as defined in USA: Secondary | ICD-10-CM | POA: Diagnosis not present

## 2016-12-26 DIAGNOSIS — I5032 Chronic diastolic (congestive) heart failure: Secondary | ICD-10-CM | POA: Diagnosis not present

## 2016-12-26 LAB — POCT INR: INR: 2.3

## 2016-12-26 NOTE — Patient Instructions (Signed)
Spoke with Ubaldo Glassing at [facility] and advised pt to continue on same dosage 1 tablet daily.  Recheck INR in 3 weeks.

## 2016-12-26 NOTE — Telephone Encounter (Signed)
Spoke with Ubaldo Glassing, [facility] RN today while at pt's home checking INR.  See anticoagulation note in Epic.

## 2016-12-27 ENCOUNTER — Encounter: Payer: Self-pay | Admitting: Interventional Cardiology

## 2016-12-30 ENCOUNTER — Other Ambulatory Visit: Payer: Self-pay | Admitting: Family Medicine

## 2016-12-30 ENCOUNTER — Ambulatory Visit
Admission: RE | Admit: 2016-12-30 | Discharge: 2016-12-30 | Disposition: A | Discharge status: Home / Self Care | Payer: Medicare Other | Source: Ambulatory Visit | Attending: Family Medicine | Admitting: Family Medicine

## 2016-12-30 DIAGNOSIS — I509 Heart failure, unspecified: Secondary | ICD-10-CM | POA: Diagnosis not present

## 2016-12-30 DIAGNOSIS — M069 Rheumatoid arthritis, unspecified: Secondary | ICD-10-CM | POA: Diagnosis not present

## 2016-12-30 DIAGNOSIS — R05 Cough: Secondary | ICD-10-CM | POA: Diagnosis not present

## 2016-12-30 DIAGNOSIS — H548 Legal blindness, as defined in USA: Secondary | ICD-10-CM | POA: Diagnosis not present

## 2016-12-30 DIAGNOSIS — M5432 Sciatica, left side: Secondary | ICD-10-CM | POA: Diagnosis not present

## 2016-12-30 DIAGNOSIS — R059 Cough, unspecified: Secondary | ICD-10-CM

## 2016-12-30 DIAGNOSIS — K529 Noninfective gastroenteritis and colitis, unspecified: Secondary | ICD-10-CM | POA: Diagnosis not present

## 2016-12-30 DIAGNOSIS — N183 Chronic kidney disease, stage 3 (moderate): Secondary | ICD-10-CM | POA: Diagnosis not present

## 2016-12-30 DIAGNOSIS — I4891 Unspecified atrial fibrillation: Secondary | ICD-10-CM | POA: Diagnosis not present

## 2016-12-30 DIAGNOSIS — R42 Dizziness and giddiness: Secondary | ICD-10-CM | POA: Diagnosis not present

## 2016-12-30 DIAGNOSIS — E039 Hypothyroidism, unspecified: Secondary | ICD-10-CM | POA: Diagnosis not present

## 2016-12-30 DIAGNOSIS — K56609 Unspecified intestinal obstruction, unspecified as to partial versus complete obstruction: Secondary | ICD-10-CM | POA: Diagnosis not present

## 2016-12-30 DIAGNOSIS — I5032 Chronic diastolic (congestive) heart failure: Secondary | ICD-10-CM | POA: Diagnosis not present

## 2016-12-30 DIAGNOSIS — Z8719 Personal history of other diseases of the digestive system: Secondary | ICD-10-CM | POA: Diagnosis not present

## 2016-12-30 DIAGNOSIS — E782 Mixed hyperlipidemia: Secondary | ICD-10-CM | POA: Diagnosis not present

## 2016-12-30 DIAGNOSIS — D649 Anemia, unspecified: Secondary | ICD-10-CM | POA: Diagnosis not present

## 2016-12-30 DIAGNOSIS — I480 Paroxysmal atrial fibrillation: Secondary | ICD-10-CM | POA: Diagnosis not present

## 2016-12-30 DIAGNOSIS — M48061 Spinal stenosis, lumbar region without neurogenic claudication: Secondary | ICD-10-CM | POA: Diagnosis not present

## 2017-01-01 NOTE — Progress Notes (Signed)
Cardiology Office Note    Date:  01/02/2017   ID:  [name], [name], MRN [id number]  PCP:  Antony Contras, MD  Cardiologist: Sinclair Grooms, MD   Chief Complaint  Patient presents with  . Atrial Fibrillation    History of Present Illness:  [name] is a 83 y.o. female  paroxysmal atrial fibrillation, chronic amiodarone therapy, history of PE, chronic anticoagulation therapy, and chronic kidney disease  Recent hospital stay with small bowel obstruction.  Had infiltrates on chest x-ray felt to represent aspiration pneumonia.  She was improved when she left the hospital.  She did not require surgery.  Over the last 2-3 days she has had a dry cough and a tickle in her throat.  She denies orthopnea.  She saw Dr. Moreen Fowler yesterday and chest x-ray was performed.  I reviewed the results and showed continued resolution of the aspiration pneumonia pattern.  No evidence of heart failure.  During the hospital stay which occurred between November 8 and 13, she did not have atrial fibrillation or obvious evidence of heart failure.  She was given antibiotics.  Small bowel obstruction resolved after n.p.o. and NG tube placement.  No recurrence of nausea or vomiting since discharge.  She denies chills and fever.   Past Medical History:  Diagnosis Date  . Blindness   . Blood clot associated with vein wall inflammation   . CKD (chronic kidney disease), stage III ([place])   . Colitis   . Goiter    s/p radioactive iod. tx  . Hypothyroidism   . On amiodarone therapy   . Paroxysmal atrial fibrillation (HCC)   . Pulmonary embolus (HCC)    and DVT s/p IVC filter  . SBO (small bowel obstruction) ([place])   . Spinal stenosis of lumbar region with radiculopathy    possible neurogenic claudication recently?  . Temporal arteritis [facility])     Past Surgical History:  Procedure Laterality Date  . APPENDECTOMY  1940  . BACK SURGERY  04/1998   spinal stenosis  . BACK SURGERY  2000   Ruptured  Disc  . BREAST BIOPSY     cysts in both breasts  . CARPAL TUNNEL RELEASE  2002  . ivc filter    . PARTIAL HYSTERECTOMY    . TONSILLECTOMY  1934  . TOTAL KNEE ARTHROPLASTY Left 04/1999  . TOTAL KNEE ARTHROPLASTY Right 02/2000    Current Medications: Outpatient Medications Prior to Visit  Medication Sig Dispense Refill  . amiodarone (PACERONE) 200 MG tablet Take 100 mg by mouth daily.    Marland Kitchen amoxicillin-clavulanate (AUGMENTIN) 500-125 MG tablet Take 1 tablet (500 mg total) 2 (two) times daily by mouth. 6 tablet 0  . Calcium Carb-Cholecalciferol (CALCIUM 600 + D) 600-200 MG-UNIT TABS Take 1 tablet by mouth 3 (three) times daily.    . feeding supplement (ENSURE SURGERY) LIQD Take 237 mLs 2 (two) times daily between meals by mouth.    Marland Kitchen guaiFENesin-dextromethorphan (ROBITUSSIN DM) 100-10 MG/5ML syrup Take 10 mLs every 4 (four) hours as needed by mouth for cough (chest congestion). 118 mL 0  . levothyroxine (SYNTHROID, LEVOTHROID) 150 MCG tablet Take 150 mcg by mouth daily before breakfast.    . Misc Natural Products (GLUCOSAMINE CHONDROITIN COMPLX) TABS Take 1 tablet by mouth 2 (two) times daily.     . Multiple Vitamins-Minerals (OCUVITE ADULT 50+ PO) Take 1 tablet by mouth daily.    . prednisoLONE 5 MG TABS tablet Take 5 mg by mouth  daily.     . warfarin (COUMADIN) 2.5 MG tablet Take 1 tablet by mouth daily or as directed by coumadin clinic.  HOLD tonight's dose , recheck INR in 1 to2 days and resume coumadin as per the INR. 35 tablet 3  . amiodarone (PACERONE) 200 MG tablet TAKE (1/2) TABLET DAILY. (Patient not taking: Reported on 01/02/2017) 45 tablet 1   No facility-administered medications prior to visit.      Allergies:   Cefdinir; Cortisone; Prednisone; Sulfasalazine; and Sulfonamide derivatives   Social History   Socioeconomic History  . Marital status: Widowed    Spouse name: None  . Number of children: 4  . Years of education: College  . Highest education level: None  Social  Needs  . Financial resource strain: None  . Food insecurity - worry: None  . Food insecurity - inability: None  . Transportation needs - medical: None  . Transportation needs - non-medical: None  Occupational History    Employer: RETIRED    Comment: retired  Tobacco Use  . Smoking status: Never Smoker  . Smokeless tobacco: Never Used  Substance and Sexual Activity  . Alcohol use: No  . Drug use: No  . Sexual activity: No  Other Topics Concern  . None  Social History Narrative   Patient lives at home alone.  Use walker to get around   Caffeine Use: rarely   Patient is right handed     Family History:  The patient's family history includes Breast cancer in her sister; Congestive Heart Failure in her mother; Stroke in her father.   ROS:   Please see the history of present illness.    Is complaining of cough, shortness of breath, decreased vision, hearing loss, difficulty with balance, easy bruising, muscle pain, and chest pressure.  Having some lower thoracic discomfort radiating around the rib cage from her back.  Aggravated by cough. All other systems reviewed and are negative.   PHYSICAL EXAM:   VS:  BP (!) 110/50   Pulse 71   Ht 5\' 6"  (1.676 m)   Wt 137 lb 3.2 oz (62.2 kg)   BMI 22.14 kg/m    GEN: Well nourished, well developed, in no acute distress  HEENT: normal  Neck: no JVD, carotid bruits, or masses Cardiac: RRR; no murmurs, rubs, or gallops,no edema  Respiratory:  clear to auscultation bilaterally, normal work of breathing GI: soft, nontender, nondistended, + BS MS: no deformity or atrophy  Skin: warm and dry, no rash Neuro:  Alert and Oriented x 3, Strength and sensation are intact Psych: euthymic mood, full affect  Wt Readings from Last 3 Encounters:  01/02/17 137 lb 3.2 oz (62.2 kg)  11/21/16 144 lb 6.4 oz (65.5 kg)  10/30/16 143 lb (64.9 kg)      Studies/Labs Reviewed:   EKG:  EKG     Recent Labs: 11/21/2016: Magnesium 1.9 11/22/2016: ALT  26 11/25/2016: BUN 21; Creatinine, Ser 1.20; Hemoglobin 11.6; Platelets 212; Potassium 3.5; Sodium 141   Lipid Panel No results found for: CHOL, TRIG, HDL, CHOLHDL, VLDL, LDLCALC, LDLDIRECT  Additional studies/ records that were reviewed today include:  Echocardiogram 11/21/16:  Study Conclusions   - Left ventricle: The cavity size was normal. Wall thickness was   increased in a pattern of mild LVH. Systolic function was normal.   The estimated ejection fraction was in the range of 60% to 65%.   Wall motion was normal; there were no regional wall motion   abnormalities. Features  are consistent with a pseudonormal left   ventricular filling pattern, with concomitant abnormal relaxation   and increased filling pressure (grade 2 diastolic dysfunction).   Doppler parameters are consistent with high ventricular filling   pressure. - Aortic valve: Valve area (VTI): 2.39 cm^2. Valve area (Vmax):   2.23 cm^2. Valve area (Vmean): 1.95 cm^2. - Mitral valve: Mildly calcified annulus. Mildly thickened leaflets   . There was mild regurgitation. - Left atrium: The atrium was moderately dilated. - Right atrium: The atrium was mildly dilated. - Atrial septum: There was increased thickness of the septum,   consistent with lipomatous hypertrophy. There was a patent   foramen ovale. PFO with small left to right shunt.   ASSESSMENT:    1. Encounter for monitoring amiodarone therapy   2. Atrial fibrillation, unspecified type ([place])   3. Chronic diastolic heart failure ([place])   4. Anticoagulated on warfarin   5. CKD (chronic kidney disease), stage IV (HCC)      PLAN:  In order of problems listed above:  1. No evidence of amiodarone toxicity.  On 100 mg/day.  Recent laboratory data during the hospital stay were unremarkable.  Chest x-ray does not reveal any progression of lung process and actual clearing compared to hospital stay. 2. Amiodarone is keeping heart in rhythm despite the stress of  recent intercurrent illness. 3. There is no evidence of volume overload.  No change in medical therapy indicated. 4. Anticoagulation therapy to prevent embolic CVA.  Clinical follow-up in 6 months.  No change in therapy.  Recommended Zyrtec 10 mg/day for 7-10 days to see if that will help with her cough.    Medication Adjustments/Labs and Tests Ordered: Current medicines are reviewed at length with the patient today.  Concerns regarding medicines are outlined above.  Medication changes, Labs and Tests ordered today are listed in the Patient Instructions below. Patient Instructions  Medication Instructions:  1) Ok to use Zyrtec 10mg  once a day for a week or two  Labwork: None  Testing/Procedures: None  Follow-Up: Your physician wants you to follow-up in: 6 months with Dr. [name].  You will receive a reminder letter in the mail two months in advance. If you don't receive a letter, please call our office to schedule the follow-up appointment.   Any Other Special Instructions Will Be Listed Below (If Applicable).     If you need a refill on your cardiac medications before your next appointment, please call your pharmacy.      Signed, Sinclair Grooms, MD  01/02/2017 11:38 AM    [facility] [address], [place], [place]  [address] Phone: [phone]; Fax: [phone]

## 2017-01-02 ENCOUNTER — Encounter: Payer: Self-pay | Admitting: Interventional Cardiology

## 2017-01-02 ENCOUNTER — Ambulatory Visit (INDEPENDENT_AMBULATORY_CARE_PROVIDER_SITE_OTHER): Payer: Medicare Other | Admitting: Interventional Cardiology

## 2017-01-02 VITALS — BP 110/50 | HR 71 | Ht 66.0 in | Wt 137.2 lb

## 2017-01-02 DIAGNOSIS — Z7901 Long term (current) use of anticoagulants: Secondary | ICD-10-CM | POA: Diagnosis not present

## 2017-01-02 DIAGNOSIS — N184 Chronic kidney disease, stage 4 (severe): Secondary | ICD-10-CM

## 2017-01-02 DIAGNOSIS — I5032 Chronic diastolic (congestive) heart failure: Secondary | ICD-10-CM | POA: Diagnosis not present

## 2017-01-02 DIAGNOSIS — Z5181 Encounter for therapeutic drug level monitoring: Secondary | ICD-10-CM | POA: Diagnosis not present

## 2017-01-02 DIAGNOSIS — Z79899 Other long term (current) drug therapy: Secondary | ICD-10-CM | POA: Diagnosis not present

## 2017-01-02 DIAGNOSIS — I4891 Unspecified atrial fibrillation: Secondary | ICD-10-CM

## 2017-01-02 NOTE — Patient Instructions (Signed)
Medication Instructions:  1) Ok to use Zyrtec 10mg  once a day for a week or two  Labwork: None  Testing/Procedures: None  Follow-Up: Your physician wants you to follow-up in: 6 months with Dr. [name].  You will receive a reminder letter in the mail two months in advance. If you don't receive a letter, please call our office to schedule the follow-up appointment.   Any Other Special Instructions Will Be Listed Below (If Applicable).     If you need a refill on your cardiac medications before your next appointment, please call your pharmacy.

## 2017-01-08 DIAGNOSIS — K56609 Unspecified intestinal obstruction, unspecified as to partial versus complete obstruction: Secondary | ICD-10-CM | POA: Diagnosis not present

## 2017-01-08 DIAGNOSIS — I5032 Chronic diastolic (congestive) heart failure: Secondary | ICD-10-CM | POA: Diagnosis not present

## 2017-01-08 DIAGNOSIS — N183 Chronic kidney disease, stage 3 (moderate): Secondary | ICD-10-CM | POA: Diagnosis not present

## 2017-01-08 DIAGNOSIS — I480 Paroxysmal atrial fibrillation: Secondary | ICD-10-CM | POA: Diagnosis not present

## 2017-01-08 DIAGNOSIS — M48061 Spinal stenosis, lumbar region without neurogenic claudication: Secondary | ICD-10-CM | POA: Diagnosis not present

## 2017-01-08 DIAGNOSIS — H548 Legal blindness, as defined in USA: Secondary | ICD-10-CM | POA: Diagnosis not present

## 2017-01-10 DIAGNOSIS — R05 Cough: Secondary | ICD-10-CM | POA: Diagnosis not present

## 2017-01-10 DIAGNOSIS — J189 Pneumonia, unspecified organism: Secondary | ICD-10-CM | POA: Diagnosis not present

## 2017-01-16 ENCOUNTER — Telehealth: Payer: Self-pay | Admitting: Interventional Cardiology

## 2017-01-16 ENCOUNTER — Ambulatory Visit (INDEPENDENT_AMBULATORY_CARE_PROVIDER_SITE_OTHER): Payer: Medicare Other

## 2017-01-16 DIAGNOSIS — H548 Legal blindness, as defined in USA: Secondary | ICD-10-CM | POA: Diagnosis not present

## 2017-01-16 DIAGNOSIS — I4891 Unspecified atrial fibrillation: Secondary | ICD-10-CM | POA: Diagnosis not present

## 2017-01-16 DIAGNOSIS — Z5181 Encounter for therapeutic drug level monitoring: Secondary | ICD-10-CM | POA: Diagnosis not present

## 2017-01-16 DIAGNOSIS — I480 Paroxysmal atrial fibrillation: Secondary | ICD-10-CM | POA: Diagnosis not present

## 2017-01-16 DIAGNOSIS — M48061 Spinal stenosis, lumbar region without neurogenic claudication: Secondary | ICD-10-CM | POA: Diagnosis not present

## 2017-01-16 DIAGNOSIS — I5032 Chronic diastolic (congestive) heart failure: Secondary | ICD-10-CM | POA: Diagnosis not present

## 2017-01-16 DIAGNOSIS — M5416 Radiculopathy, lumbar region: Secondary | ICD-10-CM | POA: Diagnosis not present

## 2017-01-16 DIAGNOSIS — M25531 Pain in right wrist: Secondary | ICD-10-CM | POA: Diagnosis not present

## 2017-01-16 DIAGNOSIS — K56609 Unspecified intestinal obstruction, unspecified as to partial versus complete obstruction: Secondary | ICD-10-CM | POA: Diagnosis not present

## 2017-01-16 DIAGNOSIS — M064 Inflammatory polyarthropathy: Secondary | ICD-10-CM | POA: Diagnosis not present

## 2017-01-16 DIAGNOSIS — N183 Chronic kidney disease, stage 3 (moderate): Secondary | ICD-10-CM | POA: Diagnosis not present

## 2017-01-16 DIAGNOSIS — Z6822 Body mass index (BMI) 22.0-22.9, adult: Secondary | ICD-10-CM | POA: Diagnosis not present

## 2017-01-16 DIAGNOSIS — M150 Primary generalized (osteo)arthritis: Secondary | ICD-10-CM | POA: Diagnosis not present

## 2017-01-16 LAB — POCT INR: INR: 3.3

## 2017-01-16 NOTE — Telephone Encounter (Signed)
Spoke with Ubaldo Glassing at [facility] and she states they are wanting to extend their home care for another 2 months.  Pt recently with PNA and they feel she needs further home care.  They would like to continue INR monitoring, CHF teaching and PNA prevention education.  Alexis states Dr. [name] would only be contacted for cardiac related issues and [facility] would be contacted for INR.  All other orders would come from PCP.  Advised I would send message to Dr. [name] for approval.  Ubaldo Glassing aware he will be out of office until next week.

## 2017-01-16 NOTE — Telephone Encounter (Signed)
New Message   Alexis from [employer] is calling to request a extension to see patient for an additional 2 weeks. The plan is to recertify after the 2 weeks for another 2 months since patient has had pneumonia. Please call.

## 2017-01-16 NOTE — Patient Instructions (Signed)
Description   Spoke with Ubaldo Glassing at [facility] and advised pt to skip today's dosage of Coumadin, then resume same dosage 1 tablet daily.  Recheck INR in 2 weeks.

## 2017-01-17 DIAGNOSIS — K56609 Unspecified intestinal obstruction, unspecified as to partial versus complete obstruction: Secondary | ICD-10-CM | POA: Diagnosis not present

## 2017-01-17 DIAGNOSIS — I480 Paroxysmal atrial fibrillation: Secondary | ICD-10-CM | POA: Diagnosis not present

## 2017-01-17 DIAGNOSIS — N183 Chronic kidney disease, stage 3 (moderate): Secondary | ICD-10-CM | POA: Diagnosis not present

## 2017-01-17 DIAGNOSIS — I5032 Chronic diastolic (congestive) heart failure: Secondary | ICD-10-CM | POA: Diagnosis not present

## 2017-01-17 DIAGNOSIS — H548 Legal blindness, as defined in USA: Secondary | ICD-10-CM | POA: Diagnosis not present

## 2017-01-17 DIAGNOSIS — M48061 Spinal stenosis, lumbar region without neurogenic claudication: Secondary | ICD-10-CM | POA: Diagnosis not present

## 2017-01-20 NOTE — Telephone Encounter (Signed)
Agree with extension and we should give PCP courtesy update.

## 2017-01-21 DIAGNOSIS — K56609 Unspecified intestinal obstruction, unspecified as to partial versus complete obstruction: Secondary | ICD-10-CM | POA: Diagnosis not present

## 2017-01-21 DIAGNOSIS — I5032 Chronic diastolic (congestive) heart failure: Secondary | ICD-10-CM | POA: Diagnosis not present

## 2017-01-21 DIAGNOSIS — H548 Legal blindness, as defined in USA: Secondary | ICD-10-CM | POA: Diagnosis not present

## 2017-01-21 DIAGNOSIS — M48061 Spinal stenosis, lumbar region without neurogenic claudication: Secondary | ICD-10-CM | POA: Diagnosis not present

## 2017-01-21 DIAGNOSIS — N183 Chronic kidney disease, stage 3 (moderate): Secondary | ICD-10-CM | POA: Diagnosis not present

## 2017-01-21 DIAGNOSIS — I480 Paroxysmal atrial fibrillation: Secondary | ICD-10-CM | POA: Diagnosis not present

## 2017-01-21 NOTE — Telephone Encounter (Signed)
Spoke with Ubaldo Glassing and made her aware Dr. [name] in agreement with extension.  Faxed information to PCP as well to make them aware.

## 2017-01-22 ENCOUNTER — Ambulatory Visit (INDEPENDENT_AMBULATORY_CARE_PROVIDER_SITE_OTHER): Payer: Medicare Other | Admitting: Podiatry

## 2017-01-22 ENCOUNTER — Encounter: Payer: Self-pay | Admitting: Podiatry

## 2017-01-22 DIAGNOSIS — M79676 Pain in unspecified toe(s): Secondary | ICD-10-CM

## 2017-01-22 DIAGNOSIS — B351 Tinea unguium: Secondary | ICD-10-CM | POA: Diagnosis not present

## 2017-01-22 DIAGNOSIS — D689 Coagulation defect, unspecified: Secondary | ICD-10-CM

## 2017-01-22 DIAGNOSIS — M25571 Pain in right ankle and joints of right foot: Secondary | ICD-10-CM

## 2017-01-22 MED ORDER — NONFORMULARY OR COMPOUNDED ITEM: 3 refills | Status: DC

## 2017-01-22 NOTE — Addendum Note (Signed)
Addended byDeidre Ala, Dorotea Hand L on: 01/22/2017 03:32 PM   Modules accepted: Orders

## 2017-01-22 NOTE — Progress Notes (Signed)
Patient ID: [name], female   DOB: 17-May-1920, 85 y.o.   MRN: [id number] Complaint:  Visit Type: Patient returns to my office for continued preventative foot care services. Complaint: Patient states" my nails have grown long and thick and become painful to walk and wear shoes" . The patient presents for preventative foot care services. No changes to ROS.  Patient has pain in right ankle which wakes her at five in the morning.  Podiatric Exam: Vascular: dorsalis pedis and posterior tibial pulses are palpable bilateral. Capillary return is immediate. Temperature gradient is WNL. Skin turgor WNL  Sensorium: Diminished  Semmes Weinstein monofilament test. Normal tactile sensation bilaterally. Nail Exam: Pt has thick disfigured discolored nails with subungual debris noted bilateral entire nail hallux through fifth toenails Ulcer Exam: There is no evidence of ulcer or pre-ulcerative changes or infection. Orthopedic Exam: Muscle tone and strength are WNL. No limitations in general ROM. No crepitus or effusions noted. Foot type and digits show no abnormalities. Bony prominences are unremarkable. Sinus tarsitis. Skin: No Porokeratosis. No infection or ulcers  Diagnosis:  Onychomycosis, , Pain in right toe, pain in left toes  Treatment & Plan Procedures and Treatment: Consent by patient was obtained for treatment procedures. The patient understood the discussion of treatment and procedures well. All questions were answered thoroughly reviewed. Debridement of mycotic and hypertrophic toenails, 1 through 5 bilateral and clearing of subungual debris. No ulceration, no infection noted.  Powerstep insoles for her shoes to support her right foot. Return Visit-Office Procedure: Patient instructed to return to the office for a follow up visit 3 months for continued evaluation and treatment.   Gardiner Barefoot DPM

## 2017-01-23 DIAGNOSIS — K56609 Unspecified intestinal obstruction, unspecified as to partial versus complete obstruction: Secondary | ICD-10-CM | POA: Diagnosis not present

## 2017-01-23 DIAGNOSIS — M48061 Spinal stenosis, lumbar region without neurogenic claudication: Secondary | ICD-10-CM | POA: Diagnosis not present

## 2017-01-23 DIAGNOSIS — I480 Paroxysmal atrial fibrillation: Secondary | ICD-10-CM | POA: Diagnosis not present

## 2017-01-23 DIAGNOSIS — H548 Legal blindness, as defined in USA: Secondary | ICD-10-CM | POA: Diagnosis not present

## 2017-01-23 DIAGNOSIS — N183 Chronic kidney disease, stage 3 (moderate): Secondary | ICD-10-CM | POA: Diagnosis not present

## 2017-01-23 DIAGNOSIS — M069 Rheumatoid arthritis, unspecified: Secondary | ICD-10-CM | POA: Diagnosis not present

## 2017-01-23 DIAGNOSIS — H9113 Presbycusis, bilateral: Secondary | ICD-10-CM | POA: Diagnosis not present

## 2017-01-23 DIAGNOSIS — H9201 Otalgia, right ear: Secondary | ICD-10-CM | POA: Diagnosis not present

## 2017-01-23 DIAGNOSIS — I5032 Chronic diastolic (congestive) heart failure: Secondary | ICD-10-CM | POA: Diagnosis not present

## 2017-01-24 DIAGNOSIS — L821 Other seborrheic keratosis: Secondary | ICD-10-CM | POA: Diagnosis not present

## 2017-01-24 DIAGNOSIS — L820 Inflamed seborrheic keratosis: Secondary | ICD-10-CM | POA: Diagnosis not present

## 2017-01-24 DIAGNOSIS — D225 Melanocytic nevi of trunk: Secondary | ICD-10-CM | POA: Diagnosis not present

## 2017-01-24 DIAGNOSIS — D1801 Hemangioma of skin and subcutaneous tissue: Secondary | ICD-10-CM | POA: Diagnosis not present

## 2017-01-28 ENCOUNTER — Ambulatory Visit (INDEPENDENT_AMBULATORY_CARE_PROVIDER_SITE_OTHER): Payer: Medicare Other | Admitting: Interventional Cardiology

## 2017-01-28 DIAGNOSIS — I4891 Unspecified atrial fibrillation: Secondary | ICD-10-CM

## 2017-01-28 DIAGNOSIS — I5032 Chronic diastolic (congestive) heart failure: Secondary | ICD-10-CM | POA: Diagnosis not present

## 2017-01-28 DIAGNOSIS — N183 Chronic kidney disease, stage 3 (moderate): Secondary | ICD-10-CM | POA: Diagnosis not present

## 2017-01-28 DIAGNOSIS — M48061 Spinal stenosis, lumbar region without neurogenic claudication: Secondary | ICD-10-CM | POA: Diagnosis not present

## 2017-01-28 DIAGNOSIS — I480 Paroxysmal atrial fibrillation: Secondary | ICD-10-CM | POA: Diagnosis not present

## 2017-01-28 DIAGNOSIS — H548 Legal blindness, as defined in USA: Secondary | ICD-10-CM | POA: Diagnosis not present

## 2017-01-28 DIAGNOSIS — Z5181 Encounter for therapeutic drug level monitoring: Secondary | ICD-10-CM | POA: Diagnosis not present

## 2017-01-28 DIAGNOSIS — K56609 Unspecified intestinal obstruction, unspecified as to partial versus complete obstruction: Secondary | ICD-10-CM | POA: Diagnosis not present

## 2017-01-28 DIAGNOSIS — N390 Urinary tract infection, site not specified: Secondary | ICD-10-CM | POA: Diagnosis not present

## 2017-01-28 LAB — POCT INR: INR: 2.2

## 2017-01-29 DIAGNOSIS — Z86711 Personal history of pulmonary embolism: Secondary | ICD-10-CM | POA: Diagnosis not present

## 2017-01-29 DIAGNOSIS — E039 Hypothyroidism, unspecified: Secondary | ICD-10-CM | POA: Diagnosis not present

## 2017-01-29 DIAGNOSIS — N183 Chronic kidney disease, stage 3 (moderate): Secondary | ICD-10-CM | POA: Diagnosis not present

## 2017-01-29 DIAGNOSIS — Z8701 Personal history of pneumonia (recurrent): Secondary | ICD-10-CM | POA: Diagnosis not present

## 2017-01-29 DIAGNOSIS — Z5181 Encounter for therapeutic drug level monitoring: Secondary | ICD-10-CM | POA: Diagnosis not present

## 2017-01-29 DIAGNOSIS — M48061 Spinal stenosis, lumbar region without neurogenic claudication: Secondary | ICD-10-CM | POA: Diagnosis not present

## 2017-01-29 DIAGNOSIS — Z7901 Long term (current) use of anticoagulants: Secondary | ICD-10-CM | POA: Diagnosis not present

## 2017-01-29 DIAGNOSIS — I480 Paroxysmal atrial fibrillation: Secondary | ICD-10-CM | POA: Diagnosis not present

## 2017-01-29 DIAGNOSIS — I5032 Chronic diastolic (congestive) heart failure: Secondary | ICD-10-CM | POA: Diagnosis not present

## 2017-01-29 DIAGNOSIS — H548 Legal blindness, as defined in USA: Secondary | ICD-10-CM | POA: Diagnosis not present

## 2017-01-29 DIAGNOSIS — Z87440 Personal history of urinary (tract) infections: Secondary | ICD-10-CM | POA: Diagnosis not present

## 2017-02-06 DIAGNOSIS — I480 Paroxysmal atrial fibrillation: Secondary | ICD-10-CM | POA: Diagnosis not present

## 2017-02-06 DIAGNOSIS — N183 Chronic kidney disease, stage 3 (moderate): Secondary | ICD-10-CM | POA: Diagnosis not present

## 2017-02-06 DIAGNOSIS — Z8701 Personal history of pneumonia (recurrent): Secondary | ICD-10-CM | POA: Diagnosis not present

## 2017-02-06 DIAGNOSIS — Z87440 Personal history of urinary (tract) infections: Secondary | ICD-10-CM | POA: Diagnosis not present

## 2017-02-06 DIAGNOSIS — M48061 Spinal stenosis, lumbar region without neurogenic claudication: Secondary | ICD-10-CM | POA: Diagnosis not present

## 2017-02-06 DIAGNOSIS — I5032 Chronic diastolic (congestive) heart failure: Secondary | ICD-10-CM | POA: Diagnosis not present

## 2017-02-11 ENCOUNTER — Ambulatory Visit (INDEPENDENT_AMBULATORY_CARE_PROVIDER_SITE_OTHER): Payer: Medicare Other | Admitting: Cardiology

## 2017-02-11 DIAGNOSIS — Z5181 Encounter for therapeutic drug level monitoring: Secondary | ICD-10-CM

## 2017-02-11 DIAGNOSIS — Z87440 Personal history of urinary (tract) infections: Secondary | ICD-10-CM | POA: Diagnosis not present

## 2017-02-11 DIAGNOSIS — N183 Chronic kidney disease, stage 3 (moderate): Secondary | ICD-10-CM | POA: Diagnosis not present

## 2017-02-11 DIAGNOSIS — I480 Paroxysmal atrial fibrillation: Secondary | ICD-10-CM | POA: Diagnosis not present

## 2017-02-11 DIAGNOSIS — I5032 Chronic diastolic (congestive) heart failure: Secondary | ICD-10-CM | POA: Diagnosis not present

## 2017-02-11 DIAGNOSIS — Z8701 Personal history of pneumonia (recurrent): Secondary | ICD-10-CM | POA: Diagnosis not present

## 2017-02-11 DIAGNOSIS — R933 Abnormal findings on diagnostic imaging of other parts of digestive tract: Secondary | ICD-10-CM | POA: Diagnosis not present

## 2017-02-11 DIAGNOSIS — M48061 Spinal stenosis, lumbar region without neurogenic claudication: Secondary | ICD-10-CM | POA: Diagnosis not present

## 2017-02-11 DIAGNOSIS — I4891 Unspecified atrial fibrillation: Secondary | ICD-10-CM

## 2017-02-11 LAB — POCT INR: INR: 2.7

## 2017-02-17 DIAGNOSIS — H2512 Age-related nuclear cataract, left eye: Secondary | ICD-10-CM | POA: Diagnosis not present

## 2017-02-17 DIAGNOSIS — H353232 Exudative age-related macular degeneration, bilateral, with inactive choroidal neovascularization: Secondary | ICD-10-CM | POA: Diagnosis not present

## 2017-02-17 DIAGNOSIS — Z961 Presence of intraocular lens: Secondary | ICD-10-CM | POA: Diagnosis not present

## 2017-02-19 ENCOUNTER — Other Ambulatory Visit: Payer: Self-pay | Admitting: Interventional Cardiology

## 2017-02-20 DIAGNOSIS — M545 Low back pain: Secondary | ICD-10-CM | POA: Diagnosis not present

## 2017-02-20 DIAGNOSIS — M79604 Pain in right leg: Secondary | ICD-10-CM | POA: Diagnosis not present

## 2017-02-25 DIAGNOSIS — I480 Paroxysmal atrial fibrillation: Secondary | ICD-10-CM | POA: Diagnosis not present

## 2017-02-25 DIAGNOSIS — Z87440 Personal history of urinary (tract) infections: Secondary | ICD-10-CM | POA: Diagnosis not present

## 2017-02-25 DIAGNOSIS — I5032 Chronic diastolic (congestive) heart failure: Secondary | ICD-10-CM | POA: Diagnosis not present

## 2017-02-25 DIAGNOSIS — Z8701 Personal history of pneumonia (recurrent): Secondary | ICD-10-CM | POA: Diagnosis not present

## 2017-02-25 DIAGNOSIS — N183 Chronic kidney disease, stage 3 (moderate): Secondary | ICD-10-CM | POA: Diagnosis not present

## 2017-02-25 DIAGNOSIS — M48061 Spinal stenosis, lumbar region without neurogenic claudication: Secondary | ICD-10-CM | POA: Diagnosis not present

## 2017-02-27 DIAGNOSIS — M545 Low back pain: Secondary | ICD-10-CM | POA: Diagnosis not present

## 2017-02-27 DIAGNOSIS — M79604 Pain in right leg: Secondary | ICD-10-CM | POA: Diagnosis not present

## 2017-03-04 ENCOUNTER — Ambulatory Visit (INDEPENDENT_AMBULATORY_CARE_PROVIDER_SITE_OTHER): Payer: Medicare Other | Admitting: Interventional Cardiology

## 2017-03-04 DIAGNOSIS — Z5181 Encounter for therapeutic drug level monitoring: Secondary | ICD-10-CM

## 2017-03-04 DIAGNOSIS — Z87440 Personal history of urinary (tract) infections: Secondary | ICD-10-CM | POA: Diagnosis not present

## 2017-03-04 DIAGNOSIS — Z8701 Personal history of pneumonia (recurrent): Secondary | ICD-10-CM | POA: Diagnosis not present

## 2017-03-04 DIAGNOSIS — I480 Paroxysmal atrial fibrillation: Secondary | ICD-10-CM | POA: Diagnosis not present

## 2017-03-04 DIAGNOSIS — N183 Chronic kidney disease, stage 3 (moderate): Secondary | ICD-10-CM | POA: Diagnosis not present

## 2017-03-04 DIAGNOSIS — I5032 Chronic diastolic (congestive) heart failure: Secondary | ICD-10-CM | POA: Diagnosis not present

## 2017-03-04 DIAGNOSIS — M48061 Spinal stenosis, lumbar region without neurogenic claudication: Secondary | ICD-10-CM | POA: Diagnosis not present

## 2017-03-04 LAB — POCT INR: INR: 4

## 2017-03-04 NOTE — Patient Instructions (Signed)
Description   Spoke with [name] with [facility] in home, advised  to skip Coumadin today, take 1/2 tablet tomorrow, then continue same dosage 1 tablet daily (3 days of prednisone left).  Recheck INR in 1 week.

## 2017-03-06 DIAGNOSIS — M79604 Pain in right leg: Secondary | ICD-10-CM | POA: Diagnosis not present

## 2017-03-06 DIAGNOSIS — H5310 Unspecified subjective visual disturbances: Secondary | ICD-10-CM | POA: Diagnosis not present

## 2017-03-06 DIAGNOSIS — M545 Low back pain: Secondary | ICD-10-CM | POA: Diagnosis not present

## 2017-03-10 ENCOUNTER — Ambulatory Visit
Admission: RE | Admit: 2017-03-10 | Discharge: 2017-03-10 | Disposition: A | Discharge status: Home / Self Care | Payer: Medicare Other | Source: Ambulatory Visit | Attending: Gastroenterology | Admitting: Gastroenterology

## 2017-03-10 ENCOUNTER — Other Ambulatory Visit: Payer: Self-pay | Admitting: Gastroenterology

## 2017-03-10 DIAGNOSIS — R1031 Right lower quadrant pain: Secondary | ICD-10-CM | POA: Diagnosis not present

## 2017-03-10 DIAGNOSIS — R933 Abnormal findings on diagnostic imaging of other parts of digestive tract: Secondary | ICD-10-CM

## 2017-03-10 DIAGNOSIS — R140 Abdominal distension (gaseous): Secondary | ICD-10-CM

## 2017-03-11 DIAGNOSIS — M545 Low back pain: Secondary | ICD-10-CM | POA: Diagnosis not present

## 2017-03-11 DIAGNOSIS — K5900 Constipation, unspecified: Secondary | ICD-10-CM | POA: Diagnosis not present

## 2017-03-11 DIAGNOSIS — R443 Hallucinations, unspecified: Secondary | ICD-10-CM | POA: Diagnosis not present

## 2017-03-12 ENCOUNTER — Telehealth: Payer: Self-pay

## 2017-03-12 ENCOUNTER — Ambulatory Visit (INDEPENDENT_AMBULATORY_CARE_PROVIDER_SITE_OTHER): Payer: Medicare Other | Admitting: Internal Medicine

## 2017-03-12 DIAGNOSIS — Z5181 Encounter for therapeutic drug level monitoring: Secondary | ICD-10-CM

## 2017-03-12 DIAGNOSIS — I5032 Chronic diastolic (congestive) heart failure: Secondary | ICD-10-CM | POA: Diagnosis not present

## 2017-03-12 DIAGNOSIS — I4891 Unspecified atrial fibrillation: Secondary | ICD-10-CM

## 2017-03-12 DIAGNOSIS — M5432 Sciatica, left side: Secondary | ICD-10-CM | POA: Diagnosis not present

## 2017-03-12 DIAGNOSIS — I480 Paroxysmal atrial fibrillation: Secondary | ICD-10-CM | POA: Diagnosis not present

## 2017-03-12 DIAGNOSIS — R1030 Lower abdominal pain, unspecified: Secondary | ICD-10-CM | POA: Diagnosis not present

## 2017-03-12 DIAGNOSIS — R441 Visual hallucinations: Secondary | ICD-10-CM

## 2017-03-12 DIAGNOSIS — K5900 Constipation, unspecified: Secondary | ICD-10-CM | POA: Diagnosis not present

## 2017-03-12 DIAGNOSIS — N183 Chronic kidney disease, stage 3 (moderate): Secondary | ICD-10-CM | POA: Diagnosis not present

## 2017-03-12 DIAGNOSIS — M48061 Spinal stenosis, lumbar region without neurogenic claudication: Secondary | ICD-10-CM | POA: Diagnosis not present

## 2017-03-12 DIAGNOSIS — Z8701 Personal history of pneumonia (recurrent): Secondary | ICD-10-CM | POA: Diagnosis not present

## 2017-03-12 DIAGNOSIS — Z87440 Personal history of urinary (tract) infections: Secondary | ICD-10-CM | POA: Diagnosis not present

## 2017-03-12 LAB — POCT INR: INR: 2.2

## 2017-03-12 NOTE — Telephone Encounter (Signed)
I called pt's daughter, Mardene Celeste, and advised her of this information. [name] says they saw an eye doctor last week. Pt has not had a fall or bumped her head. Pt stopped the prednisone a week ago. I advised pt's daughter that prednisone can cause hallucinations but this just need to be ridden out. [name] was thankful for the EEG appt next week and reported that they will be speaking with Dr. Moreen Fowler tomorrow.

## 2017-03-12 NOTE — Patient Instructions (Signed)
Description   Spoke with Colletta Maryland with [facility] in home & instructed pt to continue taking same dosage of 1 tablet daily.  Recheck INR in 2 weeks.

## 2017-03-12 NOTE — Telephone Encounter (Signed)
I called pt's daughter, [name], per [name] request, to discuss pt's appt tomorrow. [name] reports that pt was given a course of prednisone for back pain. Pt then has started "seeing things that aren't there" for the past several weeks. Pt saw her PCP, who checked for an UTI, but that was negative, and it was mentioned to the pt that the prednisone may have interfered with her coumadin and is causing a "problem in her brain." [name] wants [name] evaluation and recommendations. I advised pt that [name] does not think an office visit with her tomorrow is appropriate for visual hallucinations. A MRI brain was just completed in November of 2018, but [name] is agreeable to ordering an EEG for the pt and then after the results for the EEG are available, a follow up appt can be considered. [name] says that she is "disappointed" and "just wants [name] opinion." [name] is asking that they be called to schedule the EEG today, if possible, and verbalized understanding that the appt tomorrow will be cancelled.

## 2017-03-12 NOTE — Telephone Encounter (Signed)
Please call patient for the EEG appt and see if we can do this soon (I know Edwena Felty is out, so schedule may be tight). If the PCP was concerned about a medication interaction affecting the Coumadin and her INR level, he would surely have ordered a head CT or send her to the emergency room. I will send the phone note for clarification to the PCP's office as well. He may have ordered a head CT. In the interim, should she have any acute headache or if there was a fall causing the back pain (does not sound like she fell or bumped her head, thankfully), she should proceed to the emergency room immediately.  Please fax phone note to Dr. Moreen Fowler for completion.  Sometimes, just taking prednisone can cause hallucinations. (There is nothing we can do for that, will have to ride it out or stop the prednisone). Dr. Moreen Fowler has likely addressed this already.

## 2017-03-13 ENCOUNTER — Ambulatory Visit: Payer: Medicare Other | Admitting: Neurology

## 2017-03-19 ENCOUNTER — Telehealth: Payer: Self-pay | Admitting: Neurology

## 2017-03-19 DIAGNOSIS — R441 Visual hallucinations: Secondary | ICD-10-CM | POA: Diagnosis not present

## 2017-03-19 DIAGNOSIS — H5310 Unspecified subjective visual disturbances: Secondary | ICD-10-CM | POA: Diagnosis not present

## 2017-03-19 NOTE — Telephone Encounter (Addendum)
Pt's had an appt with Dr Cathlyn Parsons Opthamology today and dx the pt with Sherran Needs syndrome. He said the pt did not need an EEG now since it was problems with her eyes. EEG appt has been c/a  [employer]

## 2017-03-19 NOTE — Telephone Encounter (Signed)
Noted, will send to Dr. Rexene Alberts as an Juluis Rainier.

## 2017-03-21 ENCOUNTER — Other Ambulatory Visit: Payer: Medicare Other

## 2017-03-25 DIAGNOSIS — K5900 Constipation, unspecified: Secondary | ICD-10-CM | POA: Diagnosis not present

## 2017-03-25 DIAGNOSIS — R1030 Lower abdominal pain, unspecified: Secondary | ICD-10-CM | POA: Diagnosis not present

## 2017-03-26 ENCOUNTER — Ambulatory Visit (INDEPENDENT_AMBULATORY_CARE_PROVIDER_SITE_OTHER): Payer: Medicare Other | Admitting: Cardiovascular Disease

## 2017-03-26 DIAGNOSIS — Z87440 Personal history of urinary (tract) infections: Secondary | ICD-10-CM | POA: Diagnosis not present

## 2017-03-26 DIAGNOSIS — Z5181 Encounter for therapeutic drug level monitoring: Secondary | ICD-10-CM

## 2017-03-26 DIAGNOSIS — Z8701 Personal history of pneumonia (recurrent): Secondary | ICD-10-CM | POA: Diagnosis not present

## 2017-03-26 DIAGNOSIS — I5032 Chronic diastolic (congestive) heart failure: Secondary | ICD-10-CM | POA: Diagnosis not present

## 2017-03-26 DIAGNOSIS — I480 Paroxysmal atrial fibrillation: Secondary | ICD-10-CM | POA: Diagnosis not present

## 2017-03-26 DIAGNOSIS — M48061 Spinal stenosis, lumbar region without neurogenic claudication: Secondary | ICD-10-CM | POA: Diagnosis not present

## 2017-03-26 DIAGNOSIS — I4891 Unspecified atrial fibrillation: Secondary | ICD-10-CM | POA: Diagnosis not present

## 2017-03-26 DIAGNOSIS — N183 Chronic kidney disease, stage 3 (moderate): Secondary | ICD-10-CM | POA: Diagnosis not present

## 2017-03-26 LAB — POCT INR: INR: 2.7

## 2017-03-26 NOTE — Patient Instructions (Signed)
Description   Spoke with ALexis with [facility] in home & instructed pt to continue taking same dosage of 1 tablet daily.  Recheck INR in 2 weeks.

## 2017-03-27 ENCOUNTER — Other Ambulatory Visit: Payer: Medicare Other

## 2017-04-02 ENCOUNTER — Encounter: Payer: Self-pay | Admitting: Podiatry

## 2017-04-02 ENCOUNTER — Ambulatory Visit (INDEPENDENT_AMBULATORY_CARE_PROVIDER_SITE_OTHER): Payer: Medicare Other | Admitting: Podiatry

## 2017-04-02 DIAGNOSIS — B351 Tinea unguium: Secondary | ICD-10-CM

## 2017-04-02 DIAGNOSIS — D689 Coagulation defect, unspecified: Secondary | ICD-10-CM

## 2017-04-02 DIAGNOSIS — M79676 Pain in unspecified toe(s): Secondary | ICD-10-CM | POA: Diagnosis not present

## 2017-04-02 NOTE — Progress Notes (Signed)
Patient ID: JANARI GAGNER, female   DOB: [birth year], 84 y.o.   MRN: [id number] Complaint:  Visit Type: Patient returns to my office for continued preventative foot care services. Complaint: Patient states" my nails have grown long and thick and become painful to walk and wear shoes" . The patient presents for preventative foot care services. No changes to ROS.    Podiatric Exam: Vascular: dorsalis pedis and posterior tibial pulses are palpable bilateral. Capillary return is immediate. Temperature gradient is WNL. Skin turgor WNL  Sensorium: Diminished  Semmes Weinstein monofilament test. Normal tactile sensation bilaterally. Nail Exam: Pt has thick disfigured discolored nails with subungual debris noted bilateral entire nail hallux through fifth toenails Ulcer Exam: There is no evidence of ulcer or pre-ulcerative changes or infection. Orthopedic Exam: Muscle tone and strength are WNL. No limitations in general ROM. No crepitus or effusions noted. Foot type and digits show no abnormalities. Bony prominences are unremarkable. Sinus tarsitis. Skin: No Porokeratosis. No infection or ulcers  Diagnosis:  Onychomycosis, , Pain in right toe, pain in left toes  Treatment & Plan Procedures and Treatment: Consent by patient was obtained for treatment procedures. The patient understood the discussion of treatment and procedures well. All questions were answered thoroughly reviewed. Debridement of mycotic and hypertrophic toenails, 1 through 5 bilateral and clearing of subungual debris. No ulceration, no infection noted.   Return Visit-Office Procedure: Patient instructed to return to the office for a follow up visit 3 months for continued evaluation and treatment.   Gardiner Barefoot DPM

## 2017-04-15 ENCOUNTER — Other Ambulatory Visit: Payer: Self-pay | Admitting: Gastroenterology

## 2017-04-15 DIAGNOSIS — R1030 Lower abdominal pain, unspecified: Secondary | ICD-10-CM

## 2017-04-16 ENCOUNTER — Ambulatory Visit
Admission: RE | Admit: 2017-04-16 | Discharge: 2017-04-16 | Disposition: A | Discharge status: Home / Self Care | Payer: Medicare Other | Source: Ambulatory Visit | Attending: Gastroenterology | Admitting: Gastroenterology

## 2017-04-16 DIAGNOSIS — K5730 Diverticulosis of large intestine without perforation or abscess without bleeding: Secondary | ICD-10-CM | POA: Diagnosis not present

## 2017-04-16 DIAGNOSIS — R1030 Lower abdominal pain, unspecified: Secondary | ICD-10-CM

## 2017-04-16 MED ORDER — IOPAMIDOL (ISOVUE-300) INJECTION 61%: 100.0000 mL | Freq: Once | INTRAVENOUS | Status: DC | PRN

## 2017-04-16 MED ORDER — IOPAMIDOL (ISOVUE-300) INJECTION 61%
75.0000 mL | Freq: Once | INTRAVENOUS | Status: AC | PRN
  Administered 2017-04-16: 75 mL via INTRAVENOUS

## 2017-04-17 ENCOUNTER — Ambulatory Visit (INDEPENDENT_AMBULATORY_CARE_PROVIDER_SITE_OTHER): Payer: Medicare Other | Admitting: *Deleted

## 2017-04-17 DIAGNOSIS — Z5181 Encounter for therapeutic drug level monitoring: Secondary | ICD-10-CM | POA: Diagnosis not present

## 2017-04-17 DIAGNOSIS — I4891 Unspecified atrial fibrillation: Secondary | ICD-10-CM | POA: Diagnosis not present

## 2017-04-17 LAB — POCT INR: INR: 3.3

## 2017-04-17 NOTE — Patient Instructions (Signed)
Description   Do not take any Coumadin today then continue taking same dosage of 1 tablet daily.  Recheck INR in 2 weeks.

## 2017-04-21 ENCOUNTER — Other Ambulatory Visit: Payer: Medicare Other

## 2017-04-21 DIAGNOSIS — R1030 Lower abdominal pain, unspecified: Secondary | ICD-10-CM | POA: Diagnosis not present

## 2017-04-21 DIAGNOSIS — R933 Abnormal findings on diagnostic imaging of other parts of digestive tract: Secondary | ICD-10-CM | POA: Diagnosis not present

## 2017-04-21 DIAGNOSIS — K5900 Constipation, unspecified: Secondary | ICD-10-CM | POA: Diagnosis not present

## 2017-05-01 ENCOUNTER — Ambulatory Visit (INDEPENDENT_AMBULATORY_CARE_PROVIDER_SITE_OTHER): Payer: Medicare Other | Admitting: *Deleted

## 2017-05-01 DIAGNOSIS — Z5181 Encounter for therapeutic drug level monitoring: Secondary | ICD-10-CM | POA: Diagnosis not present

## 2017-05-01 DIAGNOSIS — I4891 Unspecified atrial fibrillation: Secondary | ICD-10-CM

## 2017-05-01 LAB — POCT INR: INR: 2.3

## 2017-05-01 NOTE — Patient Instructions (Signed)
Description   Continue taking same dosage of 1 tablet daily.  Recheck INR in 3 weeks.

## 2017-05-22 ENCOUNTER — Ambulatory Visit (INDEPENDENT_AMBULATORY_CARE_PROVIDER_SITE_OTHER): Payer: Medicare Other | Admitting: *Deleted

## 2017-05-22 DIAGNOSIS — K432 Incisional hernia without obstruction or gangrene: Secondary | ICD-10-CM | POA: Diagnosis not present

## 2017-05-22 DIAGNOSIS — Z5181 Encounter for therapeutic drug level monitoring: Secondary | ICD-10-CM

## 2017-05-22 DIAGNOSIS — K565 Intestinal adhesions [bands], unspecified as to partial versus complete obstruction: Secondary | ICD-10-CM | POA: Diagnosis not present

## 2017-05-22 DIAGNOSIS — I4891 Unspecified atrial fibrillation: Secondary | ICD-10-CM | POA: Diagnosis not present

## 2017-05-22 DIAGNOSIS — K8020 Calculus of gallbladder without cholecystitis without obstruction: Secondary | ICD-10-CM | POA: Diagnosis not present

## 2017-05-22 LAB — POCT INR: INR: 1.4

## 2017-05-22 NOTE — Patient Instructions (Signed)
Description   Today May 9th take 1 and 1/2 tablets then tomorrow May 10th take 1 and 1/2 tablets then continue taking same dosage of 1 tablet daily.  Recheck INR in 10 days.

## 2017-05-30 ENCOUNTER — Ambulatory Visit (INDEPENDENT_AMBULATORY_CARE_PROVIDER_SITE_OTHER): Payer: Medicare Other | Admitting: *Deleted

## 2017-05-30 DIAGNOSIS — Z5181 Encounter for therapeutic drug level monitoring: Secondary | ICD-10-CM | POA: Diagnosis not present

## 2017-05-30 DIAGNOSIS — I4891 Unspecified atrial fibrillation: Secondary | ICD-10-CM

## 2017-05-30 LAB — POCT INR: INR: 2.5

## 2017-05-30 NOTE — Patient Instructions (Signed)
Description   Continue taking same dosage of 1 tablet daily.  Recheck INR in 2 weeks. Call us with any medication changes or concerns # [phone].

## 2017-06-10 ENCOUNTER — Encounter: Payer: Self-pay | Admitting: Podiatry

## 2017-06-10 ENCOUNTER — Ambulatory Visit (INDEPENDENT_AMBULATORY_CARE_PROVIDER_SITE_OTHER): Payer: Medicare Other | Admitting: Podiatry

## 2017-06-10 DIAGNOSIS — B351 Tinea unguium: Secondary | ICD-10-CM

## 2017-06-10 DIAGNOSIS — D689 Coagulation defect, unspecified: Secondary | ICD-10-CM | POA: Diagnosis not present

## 2017-06-10 DIAGNOSIS — M79676 Pain in unspecified toe(s): Secondary | ICD-10-CM | POA: Diagnosis not present

## 2017-06-10 DIAGNOSIS — Z7901 Long term (current) use of anticoagulants: Secondary | ICD-10-CM | POA: Diagnosis not present

## 2017-06-10 NOTE — Progress Notes (Signed)
Patient ID: [name], female   DOB: May 04, 1920, 84 y.o.   MRN: [id number] Complaint:  Visit Type: Patient returns to my office for continued preventative foot care services. Complaint: Patient states" my nails have grown long and thick and become painful to walk and wear shoes" . The patient presents for preventative foot care services. No changes to ROS.  Patient relates having pain over 5 cm above right ankle  causing throbbing at night.   Podiatric Exam: Vascular: dorsalis pedis and posterior tibial pulses are palpable bilateral. Capillary return is immediate. Temperature gradient is WNL. Skin turgor WNL  Sensorium: Diminished  Semmes Weinstein monofilament test. Normal tactile sensation bilaterally. Nail Exam: Pt has thick disfigured discolored nails with subungual debris noted bilateral entire nail hallux through fifth toenails Ulcer Exam: There is no evidence of ulcer or pre-ulcerative changes or infection. Orthopedic Exam: Muscle tone and strength are WNL. No limitations in general ROM. No crepitus or effusions noted. Foot type and digits show no abnormalities. Bony prominences are unremarkable. Sinus tarsitis has resolved.  Palpable pain above right ankle with no swelling or redness noted. Skin: No Porokeratosis. No infection or ulcers  Diagnosis:  Onychomycosis, , Pain in right toe, pain in left toes  Treatment & Plan Procedures and Treatment: Consent by patient was obtained for treatment procedures. The patient understood the discussion of treatment and procedures well. All questions were answered thoroughly reviewed. Debridement of mycotic and hypertrophic toenails, 1 through 5 bilateral and clearing of subungual debris. No ulceration, no infection noted.   Return Visit-Office Procedure: Patient instructed to return to the office for a follow up visit 3 months for continued evaluation and treatment.   Gardiner Barefoot DPM

## 2017-06-17 ENCOUNTER — Ambulatory Visit (INDEPENDENT_AMBULATORY_CARE_PROVIDER_SITE_OTHER): Payer: Medicare Other | Admitting: *Deleted

## 2017-06-17 DIAGNOSIS — E039 Hypothyroidism, unspecified: Secondary | ICD-10-CM | POA: Diagnosis not present

## 2017-06-17 DIAGNOSIS — K5900 Constipation, unspecified: Secondary | ICD-10-CM | POA: Diagnosis not present

## 2017-06-17 DIAGNOSIS — M069 Rheumatoid arthritis, unspecified: Secondary | ICD-10-CM | POA: Diagnosis not present

## 2017-06-17 DIAGNOSIS — K529 Noninfective gastroenteritis and colitis, unspecified: Secondary | ICD-10-CM | POA: Diagnosis not present

## 2017-06-17 DIAGNOSIS — Z5181 Encounter for therapeutic drug level monitoring: Secondary | ICD-10-CM | POA: Diagnosis not present

## 2017-06-17 DIAGNOSIS — I4891 Unspecified atrial fibrillation: Secondary | ICD-10-CM | POA: Diagnosis not present

## 2017-06-17 DIAGNOSIS — Z1389 Encounter for screening for other disorder: Secondary | ICD-10-CM | POA: Diagnosis not present

## 2017-06-17 DIAGNOSIS — N183 Chronic kidney disease, stage 3 (moderate): Secondary | ICD-10-CM | POA: Diagnosis not present

## 2017-06-17 DIAGNOSIS — I509 Heart failure, unspecified: Secondary | ICD-10-CM | POA: Diagnosis not present

## 2017-06-17 DIAGNOSIS — Z8719 Personal history of other diseases of the digestive system: Secondary | ICD-10-CM | POA: Diagnosis not present

## 2017-06-17 DIAGNOSIS — R42 Dizziness and giddiness: Secondary | ICD-10-CM | POA: Diagnosis not present

## 2017-06-17 DIAGNOSIS — M5432 Sciatica, left side: Secondary | ICD-10-CM | POA: Diagnosis not present

## 2017-06-17 DIAGNOSIS — E782 Mixed hyperlipidemia: Secondary | ICD-10-CM | POA: Diagnosis not present

## 2017-06-17 LAB — POCT INR: INR: 2.5 (ref 2.0–3.0)

## 2017-06-17 NOTE — Patient Instructions (Signed)
Description   Continue taking same dosage of 1 tablet daily.  Recheck INR in 3 weeks. Call us with any medication changes or concerns # [phone].

## 2017-06-24 ENCOUNTER — Other Ambulatory Visit: Payer: Self-pay | Admitting: Interventional Cardiology

## 2017-06-24 DIAGNOSIS — M545 Low back pain: Secondary | ICD-10-CM | POA: Diagnosis not present

## 2017-06-24 DIAGNOSIS — M79604 Pain in right leg: Secondary | ICD-10-CM | POA: Diagnosis not present

## 2017-07-01 DIAGNOSIS — R1030 Lower abdominal pain, unspecified: Secondary | ICD-10-CM | POA: Diagnosis not present

## 2017-07-01 DIAGNOSIS — K5900 Constipation, unspecified: Secondary | ICD-10-CM | POA: Diagnosis not present

## 2017-07-04 DIAGNOSIS — M545 Low back pain: Secondary | ICD-10-CM | POA: Diagnosis not present

## 2017-07-04 DIAGNOSIS — M79604 Pain in right leg: Secondary | ICD-10-CM | POA: Diagnosis not present

## 2017-07-04 DIAGNOSIS — M79605 Pain in left leg: Secondary | ICD-10-CM | POA: Diagnosis not present

## 2017-07-07 DIAGNOSIS — M545 Low back pain: Secondary | ICD-10-CM | POA: Diagnosis not present

## 2017-07-07 DIAGNOSIS — G894 Chronic pain syndrome: Secondary | ICD-10-CM | POA: Diagnosis not present

## 2017-07-09 ENCOUNTER — Ambulatory Visit (INDEPENDENT_AMBULATORY_CARE_PROVIDER_SITE_OTHER): Payer: Medicare Other | Admitting: *Deleted

## 2017-07-09 DIAGNOSIS — Z5181 Encounter for therapeutic drug level monitoring: Secondary | ICD-10-CM

## 2017-07-09 DIAGNOSIS — I4891 Unspecified atrial fibrillation: Secondary | ICD-10-CM

## 2017-07-09 LAB — POCT INR: INR: 3.8 — AB (ref 2.0–3.0)

## 2017-07-09 NOTE — Patient Instructions (Addendum)
Description   Do not take any Coumadin today then continue taking same dosage of 1 tablet daily.  Recheck INR in 1 week. Call us with any medication changes or concerns # [phone].

## 2017-07-14 ENCOUNTER — Other Ambulatory Visit: Payer: Self-pay | Admitting: Interventional Cardiology

## 2017-07-16 DIAGNOSIS — M48061 Spinal stenosis, lumbar region without neurogenic claudication: Secondary | ICD-10-CM | POA: Diagnosis not present

## 2017-07-16 DIAGNOSIS — M150 Primary generalized (osteo)arthritis: Secondary | ICD-10-CM | POA: Diagnosis not present

## 2017-07-16 DIAGNOSIS — M0609 Rheumatoid arthritis without rheumatoid factor, multiple sites: Secondary | ICD-10-CM | POA: Diagnosis not present

## 2017-07-16 DIAGNOSIS — Z7952 Long term (current) use of systemic steroids: Secondary | ICD-10-CM | POA: Diagnosis not present

## 2017-07-16 DIAGNOSIS — Z6820 Body mass index (BMI) 20.0-20.9, adult: Secondary | ICD-10-CM | POA: Diagnosis not present

## 2017-07-18 ENCOUNTER — Ambulatory Visit (INDEPENDENT_AMBULATORY_CARE_PROVIDER_SITE_OTHER): Payer: Medicare Other | Admitting: *Deleted

## 2017-07-18 DIAGNOSIS — I4891 Unspecified atrial fibrillation: Secondary | ICD-10-CM | POA: Diagnosis not present

## 2017-07-18 DIAGNOSIS — Z5181 Encounter for therapeutic drug level monitoring: Secondary | ICD-10-CM | POA: Diagnosis not present

## 2017-07-18 LAB — POCT INR: INR: 2.7 (ref 2.0–3.0)

## 2017-07-18 NOTE — Patient Instructions (Addendum)
Description   Continue taking same dosage of 1 tablet daily.  Recheck INR in 2 weeks with MD appt. Call us with any medication changes or concerns # [phone].

## 2017-07-30 NOTE — Progress Notes (Signed)
Cardiology Office Note    Date:  07/31/2017   ID:  [name], [name] [birth year], MRN [id number]  PCP:  Antony Contras, MD  Cardiologist: Sinclair Grooms, MD   Chief Complaint  Patient presents with  . Atrial Fibrillation    History of Present Illness:  [name] is a 84 y.o. female paroxysmal atrial fibrillation, chronic amiodarone therapy, history of PE, chronic anticoagulation therapy, and chronic kidney disease.  He is doing well.  He has chronic back pain.  She has not had skipping or cardiac palpitations.  She denies chest pain.  No orthopnea or PND.  There is no peripheral edema.   Past Medical History:  Diagnosis Date  . Blindness   . Blood clot associated with vein wall inflammation   . CKD (chronic kidney disease), stage III ([place])   . Colitis   . Goiter    s/p radioactive iod. tx  . Hypothyroidism   . On amiodarone therapy   . Paroxysmal atrial fibrillation (HCC)   . Pulmonary embolus (HCC)    and DVT s/p IVC filter  . SBO (small bowel obstruction) ([place])   . Spinal stenosis of lumbar region with radiculopathy    possible neurogenic claudication recently?  . Temporal arteritis [facility]     Past Surgical History:  Procedure Laterality Date  . APPENDECTOMY  1940  . BACK SURGERY  04/1998   spinal stenosis  . BACK SURGERY  2000   Ruptured Disc  . BREAST BIOPSY     cysts in both breasts  . CARPAL TUNNEL RELEASE  2002  . ivc filter    . PARTIAL HYSTERECTOMY    . TONSILLECTOMY  1934  . TOTAL KNEE ARTHROPLASTY Left 04/1999  . TOTAL KNEE ARTHROPLASTY Right 02/2000    Current Medications: Outpatient Medications Prior to Visit  Medication Sig Dispense Refill  . amiodarone (PACERONE) 200 MG tablet Take half (1/2) tablet (100 mg) by mouth daily.    . Calcium Carb-Cholecalciferol (CALCIUM 600 + D) 600-200 MG-UNIT TABS Take 1 tablet by mouth 3 (three) times daily.    Marland Kitchen gabapentin (NEURONTIN) 100 MG capsule Take 100 mg by mouth 2 (two) times daily.    Marland Kitchen  levothyroxine (SYNTHROID, LEVOTHROID) 150 MCG tablet Take 150 mcg by mouth daily before breakfast.    . lubiprostone (AMITIZA) 8 MCG capsule Take 8 mcg by mouth 3 (three) times daily with meals.     . Misc Natural Products (GLUCOSAMINE CHONDROITIN COMPLX) TABS Take 1 tablet by mouth 2 (two) times daily.     . Multiple Vitamins-Minerals (OCUVITE ADULT 50+ PO) Take 1 tablet by mouth daily.    . NONFORMULARY OR COMPOUNDED [place]  Anti-Inflammatory Cream- Diclofenac 3%, Baclofen 2%, Lidocaine 2% Apply 1-2 grams to affected area 3-4 times daily Qty. 120 gm 3 refills 1 each 3  . prednisoLONE 5 MG TABS tablet Take 5 mg by mouth daily.     Marland Kitchen warfarin (COUMADIN) 2.5 MG tablet TAKE AS DIRECTED. 35 tablet 3  . amiodarone (PACERONE) 200 MG tablet TAKE (1/2) TABLET DAILY. (Patient not taking: Reported on 07/31/2017) 45 tablet 1  . amoxicillin-clavulanate (AUGMENTIN) 500-125 MG tablet Take 1 tablet (500 mg total) 2 (two) times daily by mouth. (Patient not taking: Reported on 07/31/2017) 6 tablet 0  . feeding supplement (ENSURE SURGERY) LIQD Take 237 mLs 2 (two) times daily between meals by mouth. (Patient not taking: Reported on 07/31/2017)    . guaiFENesin-dextromethorphan (ROBITUSSIN DM) 100-10 MG/5ML syrup  Take 10 mLs every 4 (four) hours as needed by mouth for cough (chest congestion). (Patient not taking: Reported on 07/31/2017) 118 mL 0   No facility-administered medications prior to visit.      Allergies:   Cefdinir; Cortisone; Prednisone; Sulfasalazine; and Sulfonamide derivatives   Social History   Socioeconomic History  . Marital status: Widowed    Spouse name: Not on file  . Number of children: 4  . Years of education: College  . Highest education level: Not on file  Occupational History    Employer: RETIRED    Comment: retired  Scientific laboratory technician  . Financial resource strain: Not on file  . Food insecurity:    Worry: Not on file    Inability: Not on file  . Transportation  needs:    Medical: Not on file    Non-medical: Not on file  Tobacco Use  . Smoking status: Never Smoker  . Smokeless tobacco: Never Used  Substance and Sexual Activity  . Alcohol use: No  . Drug use: No  . Sexual activity: Never  Lifestyle  . Physical activity:    Days per week: Not on file    Minutes per session: Not on file  . Stress: Not on file  Relationships  . Social connections:    Talks on phone: Not on file    Gets together: Not on file    Attends religious service: Not on file    Active member of club or organization: Not on file    Attends meetings of clubs or organizations: Not on file    Relationship status: Not on file  Other Topics Concern  . Not on file  Social History Narrative   Patient lives at home alone.  Use walker to get around   Caffeine Use: rarely   Patient is right handed     Family History:  The patient's family history includes Breast cancer in her sister; Congestive Heart Failure in her mother; Stroke in her father.   ROS:   Please see the history of present illness.    Back pain, easy bruising on Coumadin, constipation and abdominal pain related to small bowel adhesions, macular degeneration, dyspnea on exertion, decreased appetite, leg pain, nausea, and difficulty with balance. All other systems reviewed and are negative.   PHYSICAL EXAM:   VS:  BP 122/60   Pulse 69   Ht 5\' 4"  (1.626 m)   Wt 126 lb 12.8 oz (57.5 kg)   BMI 21.77 kg/m    GEN: Well nourished, well developed, in no acute distress.  Elderly but well-nourished/healthy-appearing. HEENT: normal  Neck: no JVD, carotid bruits, or masses Cardiac: RRR; no murmurs, rubs, or gallops,no edema  Respiratory:  clear to auscultation bilaterally, normal work of breathing GI: soft, nontender, nondistended, + BS MS: no deformity or atrophy  Skin: warm and dry, no rash Neuro:  Alert and Oriented x 3, Strength and sensation are intact Psych: euthymic mood, full affect  Wt Readings from  Last 3 Encounters:  07/31/17 126 lb 12.8 oz (57.5 kg)  01/02/17 137 lb 3.2 oz (62.2 kg)  11/21/16 144 lb 6.4 oz (65.5 kg)      Studies/Labs Reviewed:   EKG:  EKG not repeated.  Clinically a normal sinus rhythm to auscultation.  Recent Labs: 11/21/2016: Magnesium 1.9 11/22/2016: ALT 26 11/25/2016: BUN 21; Creatinine, Ser 1.20; Hemoglobin 11.6; Platelets 212; Potassium 3.5; Sodium 141   Lipid Panel No results found for: CHOL, TRIG, HDL, CHOLHDL, VLDL, LDLCALC, LDLDIRECT  Additional studies/ records that were reviewed today include:  No new data    ASSESSMENT:    1. Chronic atrial fibrillation ([place])   2. Chronic diastolic heart failure ([place])   3. Anticoagulated on warfarin   4. Encounter for monitoring amiodarone therapy   5. CKD (chronic kidney disease), stage IV ([place])   6. Encounter for therapeutic drug monitoring      PLAN:  In order of problems listed above:  1. Paroxysmal atrial fibrillation with good rhythm control on low-dose amiodarone, 100 mg daily.  No change in therapy is indicated at this time.  Continue to monitor TSH and liver panel twice yearly. 2. No evidence of volume overload. 3. No bleeding on Coumadin. 4. TSH and liver panel every 6 months on amiodarone 100 mg/day. 5. Not assessed  Clinical follow-up in 6 months.  Could be seen by APP and me in 1 year.    Medication Adjustments/Labs and Tests Ordered: Current medicines are reviewed at length with the patient today.  Concerns regarding medicines are outlined above.  Medication changes, Labs and Tests ordered today are listed in the Patient Instructions below. Patient Instructions  Medication Instructions:  Your physician recommends that you continue on your current medications as directed. Please refer to the Current Medication list given to you today.  Labwork: You will need to have a Liver panel and TSH drawn in January.  Testing/Procedures: None  Follow-Up: Your physician wants you to  follow-up in: 6 months with Dr. [name].  You will receive a reminder letter in the mail two months in advance. If you don't receive a letter, please call our office to schedule the follow-up appointment.   Any Other Special Instructions Will Be Listed Below (If Applicable).     If you need a refill on your cardiac medications before your next appointment, please call your pharmacy.      Signed, Sinclair Grooms, MD  07/31/2017 12:48 PM    [facility] [address], [place], [place]  [address] Phone: [phone]; Fax: [phone]

## 2017-07-31 ENCOUNTER — Encounter: Payer: Self-pay | Admitting: Interventional Cardiology

## 2017-07-31 ENCOUNTER — Encounter

## 2017-07-31 ENCOUNTER — Ambulatory Visit (INDEPENDENT_AMBULATORY_CARE_PROVIDER_SITE_OTHER): Payer: Medicare Other | Admitting: *Deleted

## 2017-07-31 ENCOUNTER — Ambulatory Visit (INDEPENDENT_AMBULATORY_CARE_PROVIDER_SITE_OTHER): Payer: Medicare Other | Admitting: Interventional Cardiology

## 2017-07-31 VITALS — BP 122/60 | HR 69 | Ht 64.0 in | Wt 126.8 lb

## 2017-07-31 DIAGNOSIS — Z5181 Encounter for therapeutic drug level monitoring: Secondary | ICD-10-CM | POA: Diagnosis not present

## 2017-07-31 DIAGNOSIS — Z7901 Long term (current) use of anticoagulants: Secondary | ICD-10-CM

## 2017-07-31 DIAGNOSIS — I482 Chronic atrial fibrillation, unspecified: Secondary | ICD-10-CM

## 2017-07-31 DIAGNOSIS — N184 Chronic kidney disease, stage 4 (severe): Secondary | ICD-10-CM

## 2017-07-31 DIAGNOSIS — Z79899 Other long term (current) drug therapy: Secondary | ICD-10-CM

## 2017-07-31 DIAGNOSIS — I5032 Chronic diastolic (congestive) heart failure: Secondary | ICD-10-CM

## 2017-07-31 LAB — POCT INR: INR: 2.8 (ref 2.0–3.0)

## 2017-07-31 NOTE — Patient Instructions (Signed)
Medication Instructions:  Your physician recommends that you continue on your current medications as directed. Please refer to the Current Medication list given to you today.  Labwork: You will need to have a Liver panel and TSH drawn in January.  Testing/Procedures: None  Follow-Up: Your physician wants you to follow-up in: 6 months with Dr. [name].  You will receive a reminder letter in the mail two months in advance. If you don't receive a letter, please call our office to schedule the follow-up appointment.   Any Other Special Instructions Will Be Listed Below (If Applicable).     If you need a refill on your cardiac medications before your next appointment, please call your pharmacy.

## 2017-07-31 NOTE — Patient Instructions (Signed)
Description   Continue taking same dosage of 1 tablet daily.  Recheck INR in 4 weeks.  Call us with any medication changes or concerns # [phone].

## 2017-08-04 DIAGNOSIS — R1030 Lower abdominal pain, unspecified: Secondary | ICD-10-CM | POA: Diagnosis not present

## 2017-08-04 DIAGNOSIS — R54 Age-related physical debility: Secondary | ICD-10-CM | POA: Diagnosis not present

## 2017-08-04 DIAGNOSIS — R350 Frequency of micturition: Secondary | ICD-10-CM | POA: Diagnosis not present

## 2017-08-04 DIAGNOSIS — N390 Urinary tract infection, site not specified: Secondary | ICD-10-CM | POA: Diagnosis not present

## 2017-08-12 ENCOUNTER — Encounter: Payer: Self-pay | Admitting: Podiatry

## 2017-08-12 ENCOUNTER — Ambulatory Visit (INDEPENDENT_AMBULATORY_CARE_PROVIDER_SITE_OTHER): Payer: Medicare Other | Admitting: Podiatry

## 2017-08-12 DIAGNOSIS — D689 Coagulation defect, unspecified: Secondary | ICD-10-CM

## 2017-08-12 DIAGNOSIS — M79676 Pain in unspecified toe(s): Secondary | ICD-10-CM | POA: Diagnosis not present

## 2017-08-12 DIAGNOSIS — B351 Tinea unguium: Secondary | ICD-10-CM | POA: Diagnosis not present

## 2017-08-12 NOTE — Progress Notes (Addendum)
Patient ID: SHAWNTRICE SALLE, female   DOB: 1920-10-28, 84 y.o.   MRN: [id number] Complaint:  Visit Type: Patient returns to my office for continued preventative foot care services. Complaint: Patient states" my nails have grown long and thick and become painful to walk and wear shoes" . The patient presents for preventative foot care services. No changes to ROS.  Patient relates having pain over 5 cm above right ankle  causing throbbing at night.   Podiatric Exam: Vascular: dorsalis pedis and posterior tibial pulses are palpable bilateral. Capillary return is immediate. Temperature gradient is WNL. Skin turgor WNL  Sensorium: Diminished  Semmes Weinstein monofilament test. Normal tactile sensation bilaterally. Nail Exam: Pt has thick disfigured discolored nails with subungual debris noted bilateral entire nail hallux through fifth toenails Ulcer Exam: There is no evidence of ulcer or pre-ulcerative changes or infection. Orthopedic Exam: Muscle tone and strength are WNL. No limitations in general ROM. No crepitus or effusions noted. Foot type and digits show no abnormalities. Bony prominences are unremarkable. Sinus tarsitis has resolved.  Palpable pain above right ankle with no swelling or redness noted. Skin: No Porokeratosis. No infection or ulcers  Diagnosis:  Onychomycosis, , Pain in right toe, pain in left toes  Treatment & Plan Procedures and Treatment: Consent by patient was obtained for treatment procedures. The patient understood the discussion of treatment and procedures well. All questions were answered thoroughly reviewed. Debridement of mycotic and hypertrophic toenails, 1 through 5 bilateral and clearing of subungual debris. No ulceration, no infection noted.  ABN signed for 2019. Return Visit-Office Procedure: Patient instructed to return to the office for a follow up visit 3 months for continued evaluation and treatment.   Gardiner Barefoot DPM

## 2017-08-19 DIAGNOSIS — H353213 Exudative age-related macular degeneration, right eye, with inactive scar: Secondary | ICD-10-CM | POA: Diagnosis not present

## 2017-08-19 DIAGNOSIS — H353222 Exudative age-related macular degeneration, left eye, with inactive choroidal neovascularization: Secondary | ICD-10-CM | POA: Diagnosis not present

## 2017-08-26 DIAGNOSIS — R351 Nocturia: Secondary | ICD-10-CM | POA: Diagnosis not present

## 2017-08-26 DIAGNOSIS — R399 Unspecified symptoms and signs involving the genitourinary system: Secondary | ICD-10-CM | POA: Diagnosis not present

## 2017-08-26 DIAGNOSIS — R102 Pelvic and perineal pain: Secondary | ICD-10-CM | POA: Diagnosis not present

## 2017-08-26 DIAGNOSIS — M545 Low back pain: Secondary | ICD-10-CM | POA: Diagnosis not present

## 2017-08-28 ENCOUNTER — Ambulatory Visit (INDEPENDENT_AMBULATORY_CARE_PROVIDER_SITE_OTHER): Payer: Medicare Other

## 2017-08-28 DIAGNOSIS — Z5181 Encounter for therapeutic drug level monitoring: Secondary | ICD-10-CM

## 2017-08-28 DIAGNOSIS — I482 Chronic atrial fibrillation, unspecified: Secondary | ICD-10-CM

## 2017-08-28 LAB — POCT INR: INR: 3.2 — AB (ref 2.0–3.0)

## 2017-08-28 NOTE — Patient Instructions (Signed)
Description   Skip today's dosage of Coumadin, then resume same dosage of 1 tablet daily.  Recheck INR in 3 weeks.  Call us with any medication changes or concerns # [phone].

## 2017-09-02 DIAGNOSIS — N3000 Acute cystitis without hematuria: Secondary | ICD-10-CM | POA: Diagnosis not present

## 2017-09-03 DIAGNOSIS — R441 Visual hallucinations: Secondary | ICD-10-CM | POA: Diagnosis not present

## 2017-09-03 DIAGNOSIS — G43909 Migraine, unspecified, not intractable, without status migrainosus: Secondary | ICD-10-CM | POA: Diagnosis not present

## 2017-09-18 ENCOUNTER — Ambulatory Visit (INDEPENDENT_AMBULATORY_CARE_PROVIDER_SITE_OTHER): Payer: Medicare Other | Admitting: *Deleted

## 2017-09-18 DIAGNOSIS — M5432 Sciatica, left side: Secondary | ICD-10-CM | POA: Diagnosis not present

## 2017-09-18 DIAGNOSIS — E782 Mixed hyperlipidemia: Secondary | ICD-10-CM | POA: Diagnosis not present

## 2017-09-18 DIAGNOSIS — I509 Heart failure, unspecified: Secondary | ICD-10-CM | POA: Diagnosis not present

## 2017-09-18 DIAGNOSIS — I482 Chronic atrial fibrillation, unspecified: Secondary | ICD-10-CM

## 2017-09-18 DIAGNOSIS — Z87440 Personal history of urinary (tract) infections: Secondary | ICD-10-CM | POA: Diagnosis not present

## 2017-09-18 DIAGNOSIS — R42 Dizziness and giddiness: Secondary | ICD-10-CM | POA: Diagnosis not present

## 2017-09-18 DIAGNOSIS — Z23 Encounter for immunization: Secondary | ICD-10-CM | POA: Diagnosis not present

## 2017-09-18 DIAGNOSIS — R3915 Urgency of urination: Secondary | ICD-10-CM | POA: Diagnosis not present

## 2017-09-18 DIAGNOSIS — Z5181 Encounter for therapeutic drug level monitoring: Secondary | ICD-10-CM | POA: Diagnosis not present

## 2017-09-18 DIAGNOSIS — K529 Noninfective gastroenteritis and colitis, unspecified: Secondary | ICD-10-CM | POA: Diagnosis not present

## 2017-09-18 DIAGNOSIS — E039 Hypothyroidism, unspecified: Secondary | ICD-10-CM | POA: Diagnosis not present

## 2017-09-18 DIAGNOSIS — N183 Chronic kidney disease, stage 3 (moderate): Secondary | ICD-10-CM | POA: Diagnosis not present

## 2017-09-18 DIAGNOSIS — I4891 Unspecified atrial fibrillation: Secondary | ICD-10-CM | POA: Diagnosis not present

## 2017-09-18 DIAGNOSIS — K5900 Constipation, unspecified: Secondary | ICD-10-CM | POA: Diagnosis not present

## 2017-09-18 LAB — POCT INR: INR: 3.1 — AB (ref 2.0–3.0)

## 2017-09-18 NOTE — Patient Instructions (Addendum)
Description   Today take 1/2 tablet of Coumadin, then start taking 1 tablet daily except 1/2 tablet on Thursdays.  Recheck INR in 2 weeks.  Call us with any medication changes or concerns # [phone].

## 2017-10-02 ENCOUNTER — Ambulatory Visit (INDEPENDENT_AMBULATORY_CARE_PROVIDER_SITE_OTHER): Payer: Medicare Other | Admitting: *Deleted

## 2017-10-02 DIAGNOSIS — I482 Chronic atrial fibrillation, unspecified: Secondary | ICD-10-CM

## 2017-10-02 DIAGNOSIS — Z5181 Encounter for therapeutic drug level monitoring: Secondary | ICD-10-CM

## 2017-10-02 LAB — POCT INR: INR: 2.8 (ref 2.0–3.0)

## 2017-10-02 NOTE — Patient Instructions (Signed)
Description   Continue taking 1 tablet daily except 1/2 tablet on Thursdays.  Recheck INR in 3 weeks.  Call us with any medication changes or concerns # [phone].

## 2017-10-14 ENCOUNTER — Encounter: Payer: Self-pay | Admitting: Podiatry

## 2017-10-14 ENCOUNTER — Ambulatory Visit (INDEPENDENT_AMBULATORY_CARE_PROVIDER_SITE_OTHER): Payer: Medicare Other | Admitting: Podiatry

## 2017-10-14 DIAGNOSIS — D689 Coagulation defect, unspecified: Secondary | ICD-10-CM

## 2017-10-14 DIAGNOSIS — B351 Tinea unguium: Secondary | ICD-10-CM

## 2017-10-14 DIAGNOSIS — M79676 Pain in unspecified toe(s): Secondary | ICD-10-CM | POA: Diagnosis not present

## 2017-10-14 DIAGNOSIS — M25571 Pain in right ankle and joints of right foot: Secondary | ICD-10-CM

## 2017-10-14 NOTE — Progress Notes (Signed)
Patient ID: TREANNA DUMLER, female   DOB: 05/18/1920, 84 y.o.   MRN: [id number] Complaint:  Visit Type: Patient returns to my office for continued preventative foot care services. Complaint: Patient states" my nails have grown long and thick and become painful to walk and wear shoes" . The patient presents for preventative foot care services. No changes to ROS.  Patient relates having pain over 5 cm above right ankle  causing throbbing at night.   Podiatric Exam: Vascular: dorsalis pedis and posterior tibial pulses are palpable bilateral. Capillary return is immediate. Temperature gradient is WNL. Skin turgor WNL  Sensorium: Diminished  Semmes Weinstein monofilament test. Normal tactile sensation bilaterally. Nail Exam: Pt has thick disfigured discolored nails with subungual debris noted bilateral entire nail hallux through fifth toenails Ulcer Exam: There is no evidence of ulcer or pre-ulcerative changes or infection. Orthopedic Exam: Muscle tone and strength are WNL. No limitations in general ROM. No crepitus or effusions noted. Foot type and digits show no abnormalities. Bony prominences are unremarkable. Sinus tarsitis has resolved.  Palpable pain above right ankle with no swelling or redness noted. Skin: No Porokeratosis. No infection or ulcers  Diagnosis:  Onychomycosis, , Pain in right toe, pain in left toes  Treatment & Plan Procedures and Treatment: Consent by patient was obtained for treatment procedures. The patient understood the discussion of treatment and procedures well. All questions were answered thoroughly reviewed. Debridement of mycotic and hypertrophic toenails, 1 through 5 bilateral and clearing of subungual debris. No ulceration, no infection noted.  ABN signed for 2019. Return Visit-Office Procedure: Patient instructed to return to the office for a follow up visit 3 months for continued evaluation and treatment.   Gardiner Barefoot DPM

## 2017-10-23 ENCOUNTER — Ambulatory Visit (INDEPENDENT_AMBULATORY_CARE_PROVIDER_SITE_OTHER): Payer: Medicare Other | Admitting: *Deleted

## 2017-10-23 DIAGNOSIS — I4891 Unspecified atrial fibrillation: Secondary | ICD-10-CM | POA: Diagnosis not present

## 2017-10-23 DIAGNOSIS — Z5181 Encounter for therapeutic drug level monitoring: Secondary | ICD-10-CM | POA: Diagnosis not present

## 2017-10-23 LAB — POCT INR: INR: 2.7 (ref 2.0–3.0)

## 2017-10-23 NOTE — Patient Instructions (Signed)
Description   Continue taking 1 tablet daily except 1/2 tablet on Thursdays.  Recheck INR in 4 weeks.  Call us with any medication changes or concerns # [phone].

## 2017-11-17 DIAGNOSIS — L821 Other seborrheic keratosis: Secondary | ICD-10-CM | POA: Diagnosis not present

## 2017-11-17 DIAGNOSIS — L718 Other rosacea: Secondary | ICD-10-CM | POA: Diagnosis not present

## 2017-11-17 DIAGNOSIS — D1801 Hemangioma of skin and subcutaneous tissue: Secondary | ICD-10-CM | POA: Diagnosis not present

## 2017-11-17 DIAGNOSIS — D225 Melanocytic nevi of trunk: Secondary | ICD-10-CM | POA: Diagnosis not present

## 2017-11-20 ENCOUNTER — Ambulatory Visit (INDEPENDENT_AMBULATORY_CARE_PROVIDER_SITE_OTHER): Payer: Medicare Other | Admitting: Pharmacist

## 2017-11-20 DIAGNOSIS — M25511 Pain in right shoulder: Secondary | ICD-10-CM | POA: Diagnosis not present

## 2017-11-20 DIAGNOSIS — M79604 Pain in right leg: Secondary | ICD-10-CM | POA: Diagnosis not present

## 2017-11-20 DIAGNOSIS — Z5181 Encounter for therapeutic drug level monitoring: Secondary | ICD-10-CM | POA: Diagnosis not present

## 2017-11-20 DIAGNOSIS — M545 Low back pain: Secondary | ICD-10-CM | POA: Diagnosis not present

## 2017-11-20 DIAGNOSIS — I4891 Unspecified atrial fibrillation: Secondary | ICD-10-CM | POA: Diagnosis not present

## 2017-11-20 LAB — POCT INR: INR: 2.3 (ref 2.0–3.0)

## 2017-11-20 NOTE — Patient Instructions (Signed)
Description   Continue taking 1 tablet daily except 1/2 tablet on Thursdays.  Recheck INR in 6 weeks.  Call us with any medication changes or concerns # [phone].

## 2017-12-04 DIAGNOSIS — M25511 Pain in right shoulder: Secondary | ICD-10-CM | POA: Diagnosis not present

## 2017-12-04 DIAGNOSIS — M545 Low back pain: Secondary | ICD-10-CM | POA: Diagnosis not present

## 2017-12-10 ENCOUNTER — Other Ambulatory Visit: Payer: Self-pay | Admitting: Interventional Cardiology

## 2017-12-16 ENCOUNTER — Other Ambulatory Visit: Payer: Self-pay | Admitting: Interventional Cardiology

## 2017-12-18 DIAGNOSIS — H43813 Vitreous degeneration, bilateral: Secondary | ICD-10-CM | POA: Diagnosis not present

## 2017-12-18 DIAGNOSIS — H353232 Exudative age-related macular degeneration, bilateral, with inactive choroidal neovascularization: Secondary | ICD-10-CM | POA: Diagnosis not present

## 2017-12-19 ENCOUNTER — Ambulatory Visit (INDEPENDENT_AMBULATORY_CARE_PROVIDER_SITE_OTHER): Payer: Medicare Other | Admitting: Pharmacist

## 2017-12-19 DIAGNOSIS — I4891 Unspecified atrial fibrillation: Secondary | ICD-10-CM

## 2017-12-19 DIAGNOSIS — Z5181 Encounter for therapeutic drug level monitoring: Secondary | ICD-10-CM | POA: Diagnosis not present

## 2017-12-19 LAB — POCT INR: INR: 2.7 (ref 2.0–3.0)

## 2017-12-19 NOTE — Patient Instructions (Signed)
Description   Continue taking 1 tablet daily except 1/2 tablet on Thursdays.  Recheck INR in 4 weeks.  Call us with any medication changes or concerns # [phone].

## 2017-12-22 DIAGNOSIS — M25511 Pain in right shoulder: Secondary | ICD-10-CM | POA: Diagnosis not present

## 2017-12-22 DIAGNOSIS — M79604 Pain in right leg: Secondary | ICD-10-CM | POA: Diagnosis not present

## 2017-12-22 DIAGNOSIS — M545 Low back pain: Secondary | ICD-10-CM | POA: Diagnosis not present

## 2017-12-23 ENCOUNTER — Other Ambulatory Visit: Payer: Self-pay | Admitting: Sports Medicine

## 2017-12-23 DIAGNOSIS — M79604 Pain in right leg: Secondary | ICD-10-CM

## 2017-12-24 ENCOUNTER — Ambulatory Visit
Admission: RE | Admit: 2017-12-24 | Discharge: 2017-12-24 | Disposition: A | Discharge status: Home / Self Care | Payer: Medicare Other | Source: Ambulatory Visit | Attending: Sports Medicine | Admitting: Sports Medicine

## 2017-12-24 DIAGNOSIS — M79604 Pain in right leg: Secondary | ICD-10-CM

## 2017-12-24 DIAGNOSIS — M1611 Unilateral primary osteoarthritis, right hip: Secondary | ICD-10-CM | POA: Diagnosis not present

## 2017-12-26 ENCOUNTER — Ambulatory Visit
Admission: RE | Admit: 2017-12-26 | Discharge: 2017-12-26 | Disposition: A | Discharge status: Home / Self Care | Payer: Medicare Other | Source: Ambulatory Visit | Attending: Sports Medicine | Admitting: Sports Medicine

## 2017-12-26 ENCOUNTER — Other Ambulatory Visit: Payer: Self-pay | Admitting: Sports Medicine

## 2017-12-26 DIAGNOSIS — M545 Low back pain, unspecified: Secondary | ICD-10-CM

## 2017-12-26 DIAGNOSIS — M48061 Spinal stenosis, lumbar region without neurogenic claudication: Secondary | ICD-10-CM | POA: Diagnosis not present

## 2017-12-31 DIAGNOSIS — M5136 Other intervertebral disc degeneration, lumbar region: Secondary | ICD-10-CM | POA: Diagnosis not present

## 2017-12-31 DIAGNOSIS — M48061 Spinal stenosis, lumbar region without neurogenic claudication: Secondary | ICD-10-CM | POA: Diagnosis not present

## 2018-01-03 DIAGNOSIS — S2231XA Fracture of one rib, right side, initial encounter for closed fracture: Secondary | ICD-10-CM | POA: Diagnosis not present

## 2018-01-03 DIAGNOSIS — S7001XA Contusion of right hip, initial encounter: Secondary | ICD-10-CM | POA: Diagnosis not present

## 2018-01-03 DIAGNOSIS — S5001XA Contusion of right elbow, initial encounter: Secondary | ICD-10-CM | POA: Diagnosis not present

## 2018-01-15 DIAGNOSIS — M5136 Other intervertebral disc degeneration, lumbar region: Secondary | ICD-10-CM | POA: Diagnosis not present

## 2018-01-15 DIAGNOSIS — M545 Low back pain: Secondary | ICD-10-CM | POA: Diagnosis not present

## 2018-01-15 DIAGNOSIS — M25511 Pain in right shoulder: Secondary | ICD-10-CM | POA: Diagnosis not present

## 2018-01-20 ENCOUNTER — Encounter: Payer: Self-pay | Admitting: Podiatry

## 2018-01-20 ENCOUNTER — Ambulatory Visit (INDEPENDENT_AMBULATORY_CARE_PROVIDER_SITE_OTHER): Payer: Medicare Other | Admitting: Podiatry

## 2018-01-20 DIAGNOSIS — M48061 Spinal stenosis, lumbar region without neurogenic claudication: Secondary | ICD-10-CM | POA: Diagnosis not present

## 2018-01-20 DIAGNOSIS — M79676 Pain in unspecified toe(s): Secondary | ICD-10-CM | POA: Diagnosis not present

## 2018-01-20 DIAGNOSIS — D689 Coagulation defect, unspecified: Secondary | ICD-10-CM

## 2018-01-20 DIAGNOSIS — M2550 Pain in unspecified joint: Secondary | ICD-10-CM | POA: Diagnosis not present

## 2018-01-20 DIAGNOSIS — B351 Tinea unguium: Secondary | ICD-10-CM

## 2018-01-20 DIAGNOSIS — M150 Primary generalized (osteo)arthritis: Secondary | ICD-10-CM | POA: Diagnosis not present

## 2018-01-20 DIAGNOSIS — M0609 Rheumatoid arthritis without rheumatoid factor, multiple sites: Secondary | ICD-10-CM | POA: Diagnosis not present

## 2018-01-20 NOTE — Progress Notes (Signed)
Patient ID: [name], female   DOB: [birth year], 85 y.o.   MRN: [id number] Complaint:  Visit Type: Patient returns to my office for continued preventative foot care services. Complaint: Patient states" my nails have grown long and thick and become painful to walk and wear shoes" . The patient presents for preventative foot care services. No changes to ROS.    Podiatric Exam: Vascular: dorsalis pedis and posterior tibial pulses are palpable bilateral. Capillary return is immediate. Temperature gradient is WNL. Skin turgor WNL  Sensorium: Diminished  Semmes Weinstein monofilament test. Normal tactile sensation bilaterally. Nail Exam: Pt has thick disfigured discolored nails with subungual debris noted bilateral entire nail hallux through fifth toenails Ulcer Exam: There is no evidence of ulcer or pre-ulcerative changes or infection. Orthopedic Exam: Muscle tone and strength are WNL. No limitations in general ROM. No crepitus or effusions noted. Foot type and digits show no abnormalities. Bony prominences are unremarkable. Sinus tarsitis has resolved.  Palpable pain above right ankle with no swelling or redness noted. Skin: No Porokeratosis. No infection or ulcers  Diagnosis:  Onychomycosis, , Pain in right toe, pain in left toes  Treatment & Plan Procedures and Treatment: Consent by patient was obtained for treatment procedures. The patient understood the discussion of treatment and procedures well. All questions were answered thoroughly reviewed. Debridement of mycotic and hypertrophic toenails, 1 through 5 bilateral and clearing of subungual debris. No ulceration, no infection noted.  ABN signed for 2020.Marland Kitchen Return Visit-Office Procedure: Patient instructed to return to the office for a follow up visit 3 months for continued evaluation and treatment.   Gardiner Barefoot DPM

## 2018-01-23 DIAGNOSIS — K529 Noninfective gastroenteritis and colitis, unspecified: Secondary | ICD-10-CM | POA: Diagnosis not present

## 2018-01-23 DIAGNOSIS — M5432 Sciatica, left side: Secondary | ICD-10-CM | POA: Diagnosis not present

## 2018-01-23 DIAGNOSIS — N183 Chronic kidney disease, stage 3 (moderate): Secondary | ICD-10-CM | POA: Diagnosis not present

## 2018-01-23 DIAGNOSIS — R5381 Other malaise: Secondary | ICD-10-CM | POA: Diagnosis not present

## 2018-01-23 DIAGNOSIS — K5900 Constipation, unspecified: Secondary | ICD-10-CM | POA: Diagnosis not present

## 2018-01-23 DIAGNOSIS — I509 Heart failure, unspecified: Secondary | ICD-10-CM | POA: Diagnosis not present

## 2018-01-23 DIAGNOSIS — E782 Mixed hyperlipidemia: Secondary | ICD-10-CM | POA: Diagnosis not present

## 2018-01-23 DIAGNOSIS — I4891 Unspecified atrial fibrillation: Secondary | ICD-10-CM | POA: Diagnosis not present

## 2018-01-23 DIAGNOSIS — Z87440 Personal history of urinary (tract) infections: Secondary | ICD-10-CM | POA: Diagnosis not present

## 2018-01-23 DIAGNOSIS — M069 Rheumatoid arthritis, unspecified: Secondary | ICD-10-CM | POA: Diagnosis not present

## 2018-01-23 DIAGNOSIS — E039 Hypothyroidism, unspecified: Secondary | ICD-10-CM | POA: Diagnosis not present

## 2018-01-23 DIAGNOSIS — S4291XD Fracture of right shoulder girdle, part unspecified, subsequent encounter for fracture with routine healing: Secondary | ICD-10-CM | POA: Diagnosis not present

## 2018-01-26 NOTE — Progress Notes (Signed)
Cardiology Office Note:    Date:  01/27/2018   ID:  [name], DOB 1920/01/19, MRN [id number]  PCP:  Antony Contras, MD  Cardiologist:  No primary care provider on file.   Referring MD: Antony Contras, MD   Chief Complaint  Patient presents with  . Atrial Fibrillation    History of Present Illness:    [name] is a 85 y.o. female with a hx of paroxysmal atrial fibrillation, chronic amiodarone therapy, history of PE, chronic anticoagulation therapy, and chronic kidney disease.  No cardiac complaints.  Very brief and rare episodes of palpitation.  She did have a weekend fall before Christmas with right rib fractures and rotator cuff injury.  Also had a deep bruise in the right thigh.  She was ambulating and lost her balance.  States she started falling backwards and hit a coffee table.  It was witnessed and there was no loss of consciousness.  Past Medical History:  Diagnosis Date  . Blindness   . Blood clot associated with vein wall inflammation   . CKD (chronic kidney disease), stage III ([place])   . Colitis   . Goiter    s/p radioactive iod. tx  . Hypothyroidism   . On amiodarone therapy   . Paroxysmal atrial fibrillation (HCC)   . Pulmonary embolus (HCC)    and DVT s/p IVC filter  . SBO (small bowel obstruction) ([place])   . Spinal stenosis of lumbar region with radiculopathy    possible neurogenic claudication recently?  . Temporal arteritis [facility])     Past Surgical History:  Procedure Laterality Date  . APPENDECTOMY  1940  . BACK SURGERY  04/1998   spinal stenosis  . BACK SURGERY  2000   Ruptured Disc  . BREAST BIOPSY     cysts in both breasts  . CARPAL TUNNEL RELEASE  2002  . ivc filter    . PARTIAL HYSTERECTOMY    . TONSILLECTOMY  1934  . TOTAL KNEE ARTHROPLASTY Left 04/1999  . TOTAL KNEE ARTHROPLASTY Right 02/2000    Current Medications: Current Meds  Medication Sig  . amiodarone (PACERONE) 200 MG tablet TAKE (1/2) TABLET DAILY.  . Calcium  Carbonate-Vitamin D (CALCIUM HIGH POTENCY/VITAMIN D) 600-200 MG-UNIT TABS Take by mouth.  Drusilla Kanner EXTRACT PO Take by mouth daily. Take 2 tablets twice a day.  . gabapentin (NEURONTIN) 100 MG capsule gabapentin 100 mg capsule  Take 1 capsule 3 times a day by oral route.  Marland Kitchen levothyroxine (SYNTHROID, LEVOTHROID) 150 MCG tablet Take 150 mcg by mouth daily before breakfast.  . lubiprostone (AMITIZA) 8 MCG capsule Take 8 mcg by mouth 3 (three) times daily with meals.   . metroNIDAZOLE (METROCREAM) 0.75 % cream Apply to nose and cheeks at night as directed  . Misc Natural Products (GLUCOSAMINE CHONDROITIN COMPLX) TABS Take 1 tablet by mouth 2 (two) times daily.   . Misc Natural Products (OSTEO BI-FLEX TRIPLE STRENGTH PO) Osteo Bi-Flex Triple Strength  . Multiple Vitamins-Minerals (OCUVITE ADULT 50+ PO) Take 1 tablet by mouth daily.  . nitrofurantoin (MACRODANTIN) 50 MG capsule Take 50 mg by mouth daily.  . NONFORMULARY OR COMPOUNDED [place]  Anti-Inflammatory Cream- Diclofenac 3%, Baclofen 2%, Lidocaine 2% Apply 1-2 grams to affected area 3-4 times daily Qty. 120 gm 3 refills  . prednisoLONE 5 MG TABS tablet Take 5 mg by mouth daily.   . Probiotic Product ([place]) Take by mouth.  . Saccharomyces boulardii (FLORASTOR PO) Take by  mouth 2 (two) times daily.  . traMADol (ULTRAM) 50 MG tablet tramadol 50 mg tablet  Take 1 tablet twice a day by oral route as needed.  . warfarin (COUMADIN) 2.5 MG tablet TAKE AS DIRECTED BY COUMADIN CLINIC  . [DISCONTINUED] nitrofurantoin, macrocrystal-monohydrate, (MACROBID) 100 MG capsule      Allergies:   Hydrocortisone; Sulfa antibiotics; Cefdinir; Cortisone; Prednisone; Sulfasalazine; and Sulfonamide derivatives   Social History   Socioeconomic History  . Marital status: Widowed    Spouse name: Not on file  . Number of children: 4  . Years of education: College  . Highest education level: Not on file  Occupational  History    Employer: RETIRED    Comment: retired  Scientific laboratory technician  . Financial resource strain: Not on file  . Food insecurity:    Worry: Not on file    Inability: Not on file  . Transportation needs:    Medical: Not on file    Non-medical: Not on file  Tobacco Use  . Smoking status: Never Smoker  . Smokeless tobacco: Never Used  Substance and Sexual Activity  . Alcohol use: No  . Drug use: No  . Sexual activity: Never  Lifestyle  . Physical activity:    Days per week: Not on file    Minutes per session: Not on file  . Stress: Not on file  Relationships  . Social connections:    Talks on phone: Not on file    Gets together: Not on file    Attends religious service: Not on file    Active member of club or organization: Not on file    Attends meetings of clubs or organizations: Not on file    Relationship status: Not on file  Other Topics Concern  . Not on file  Social History Narrative   Patient lives at home alone.  Use walker to get around   Caffeine Use: rarely   Patient is right handed     Family History: The patient's family history includes Breast cancer in her sister; Congestive Heart Failure in her mother; Stroke in her father. There is no history of Crohn's disease or Ulcerative colitis.  ROS:   Please see the history of present illness.    Chronic back and neck pain, muscle pain, leg pain, joint swelling, and difficulty with balance.  Also complains of easy bruising.  She did not have head trauma with the fall.  All other systems reviewed and are negative.  EKGs/Labs/Other Studies Reviewed:    The following studies were reviewed today: No new imaging or functional data  EKG:  EKG performed today demonstrates sinus rhythm with ST-T wave abnormality, prominent voltage, incomplete right bundle.  When compared to prior tracings from November 2018, no change has occurred.  Recent Labs: No results found for requested labs within last 8760 hours.  Recent Lipid  Panel No results found for: CHOL, TRIG, HDL, CHOLHDL, VLDL, LDLCALC, LDLDIRECT  Physical Exam:    VS:  BP (!) 114/52   Pulse 60   Ht 5\' 4"  (1.626 m)   Wt 126 lb 9.6 oz (57.4 kg)   SpO2 98%   BMI 21.73 kg/m     Wt Readings from Last 3 Encounters:  01/27/18 126 lb 9.6 oz (57.4 kg)  07/31/17 126 lb 12.8 oz (57.5 kg)  01/02/17 137 lb 3.2 oz (62.2 kg)     GEN: Elderly and pale.. No acute distress HEENT: Normal NECK: No JVD. LYMPHATICS: No lymphadenopathy CARDIAC: Irregular RR.  No murmur, gallop, edema VASCULAR: Pulses 2+ and symmetric in the radial and carotid., Bruits are absent in the neck. RESPIRATORY:  Clear to auscultation without rales, wheezing or rhonchi  ABDOMEN: Soft, non-tender, non-distended, No pulsatile mass, MUSCULOSKELETAL: No deformity  SKIN: Warm and dry NEUROLOGIC:  Alert and oriented x 3 PSYCHIATRIC:  Normal affect   ASSESSMENT:    1. Atrial fibrillation, unspecified type ([place])   2. CKD (chronic kidney disease), stage IV ([place])   3. Encounter for therapeutic drug monitoring   4. Chronic diastolic heart failure ([place])   5. Anticoagulated on warfarin   6. Disorder of thyroid    PLAN:    In order of problems listed above:  1. Rhythm control with very low-dose amiodarone, 100 mg/day. 2. Not assessed 3. Amiodarone therapy without obvious toxicity.  Recent liver panel and TSH by Dr. Rockwell Germany are unremarkable and do not demonstrate evidence of toxicity. 4. No evidence of volume overload. 5. No bleeding complications.   Clinical follow-up in 6 months with team member and 12 months with me   Medication Adjustments/Labs and Tests Ordered: Current medicines are reviewed at length with the patient today.  Concerns regarding medicines are outlined above.  Orders Placed This Encounter  Procedures  . EKG 12-Lead   No orders of the defined types were placed in this encounter.   Patient Instructions  Medication Instructions:  Your physician recommends that  you continue on your current medications as directed. Please refer to the Current Medication list given to you today. If you need a refill on your cardiac medications before your next appointment, please call your pharmacy.   Lab work: None If you have labs (blood work) drawn today and your tests are completely normal, you will receive your results only by: Marland Kitchen MyChart Message (if you have MyChart) OR . A paper copy in the mail If you have any lab test that is abnormal or we need to change your treatment, we will call you to review the results.  Testing/Procedures: None  Follow-Up: At [facility], you and your health needs are our priority.  As part of our continuing mission to provide you with exceptional heart care, we have created designated Provider Care Teams.  These Care Teams include your primary Cardiologist (physician) and Advanced Practice Providers (APPs -  Physician Assistants and Nurse Practitioners) who all work together to provide you with the care you need, when you need it. You will need a follow up appointment in 6 months.  Please call our office 2 months in advance to schedule this appointment.  You may see Dr. [name] or one of the following Advanced Practice Providers on your designated Care Team:   Truitt Merle, NP Cecilie Kicks, NP . Kathyrn Drown, NP  Any Other Special Instructions Will Be Listed Below (If Applicable).       Signed, Sinclair Grooms, MD  01/27/2018 10:43 AM    [place]

## 2018-01-27 ENCOUNTER — Ambulatory Visit (INDEPENDENT_AMBULATORY_CARE_PROVIDER_SITE_OTHER): Payer: Medicare Other

## 2018-01-27 ENCOUNTER — Encounter: Payer: Self-pay | Admitting: Interventional Cardiology

## 2018-01-27 ENCOUNTER — Ambulatory Visit (INDEPENDENT_AMBULATORY_CARE_PROVIDER_SITE_OTHER): Payer: Medicare Other | Admitting: Interventional Cardiology

## 2018-01-27 VITALS — BP 114/52 | HR 60 | Ht 64.0 in | Wt 126.6 lb

## 2018-01-27 DIAGNOSIS — E079 Disorder of thyroid, unspecified: Secondary | ICD-10-CM | POA: Diagnosis not present

## 2018-01-27 DIAGNOSIS — I4891 Unspecified atrial fibrillation: Secondary | ICD-10-CM

## 2018-01-27 DIAGNOSIS — I5032 Chronic diastolic (congestive) heart failure: Secondary | ICD-10-CM | POA: Diagnosis not present

## 2018-01-27 DIAGNOSIS — Z5181 Encounter for therapeutic drug level monitoring: Secondary | ICD-10-CM | POA: Diagnosis not present

## 2018-01-27 DIAGNOSIS — Z7901 Long term (current) use of anticoagulants: Secondary | ICD-10-CM | POA: Diagnosis not present

## 2018-01-27 DIAGNOSIS — N184 Chronic kidney disease, stage 4 (severe): Secondary | ICD-10-CM | POA: Diagnosis not present

## 2018-01-27 LAB — POCT INR: INR: 3.3 — AB (ref 2.0–3.0)

## 2018-01-27 NOTE — Patient Instructions (Signed)
Please skip coumadin tonight, then continue taking 1 tablet daily except 1/2 tablet on Thursdays.  Recheck INR in 3 weeks.  Call us with any medication changes or concerns # [phone].

## 2018-01-27 NOTE — Patient Instructions (Signed)
Medication Instructions:  Your physician recommends that you continue on your current medications as directed. Please refer to the Current Medication list given to you today. If you need a refill on your cardiac medications before your next appointment, please call your pharmacy.   Lab work: None If you have labs (blood work) drawn today and your tests are completely normal, you will receive your results only by: Marland Kitchen MyChart Message (if you have MyChart) OR . A paper copy in the mail If you have any lab test that is abnormal or we need to change your treatment, we will call you to review the results.  Testing/Procedures: None  Follow-Up: At [facility], you and your health needs are our priority.  As part of our continuing mission to provide you with exceptional heart care, we have created designated Provider Care Teams.  These Care Teams include your primary Cardiologist (physician) and Advanced Practice Providers (APPs -  Physician Assistants and Nurse Practitioners) who all work together to provide you with the care you need, when you need it. You will need a follow up appointment in 6 months.  Please call our office 2 months in advance to schedule this appointment.  You may see Dr. [name] or one of the following Advanced Practice Providers on your designated Care Team:   Truitt Merle, NP Cecilie Kicks, NP . Kathyrn Drown, NP  Any Other Special Instructions Will Be Listed Below (If Applicable).

## 2018-01-29 DIAGNOSIS — M25511 Pain in right shoulder: Secondary | ICD-10-CM | POA: Diagnosis not present

## 2018-01-29 DIAGNOSIS — K5900 Constipation, unspecified: Secondary | ICD-10-CM | POA: Diagnosis not present

## 2018-01-29 DIAGNOSIS — M25512 Pain in left shoulder: Secondary | ICD-10-CM | POA: Diagnosis not present

## 2018-02-03 DIAGNOSIS — Z7901 Long term (current) use of anticoagulants: Secondary | ICD-10-CM | POA: Diagnosis not present

## 2018-02-03 DIAGNOSIS — H547 Unspecified visual loss: Secondary | ICD-10-CM | POA: Diagnosis not present

## 2018-02-03 DIAGNOSIS — M810 Age-related osteoporosis without current pathological fracture: Secondary | ICD-10-CM | POA: Diagnosis not present

## 2018-02-03 DIAGNOSIS — Z9181 History of falling: Secondary | ICD-10-CM | POA: Diagnosis not present

## 2018-02-03 DIAGNOSIS — I10 Essential (primary) hypertension: Secondary | ICD-10-CM | POA: Diagnosis not present

## 2018-02-03 DIAGNOSIS — M19011 Primary osteoarthritis, right shoulder: Secondary | ICD-10-CM | POA: Diagnosis not present

## 2018-02-03 DIAGNOSIS — M75101 Unspecified rotator cuff tear or rupture of right shoulder, not specified as traumatic: Secondary | ICD-10-CM | POA: Diagnosis not present

## 2018-02-03 DIAGNOSIS — M19012 Primary osteoarthritis, left shoulder: Secondary | ICD-10-CM | POA: Diagnosis not present

## 2018-02-03 DIAGNOSIS — M5136 Other intervertebral disc degeneration, lumbar region: Secondary | ICD-10-CM | POA: Diagnosis not present

## 2018-02-03 DIAGNOSIS — Z7952 Long term (current) use of systemic steroids: Secondary | ICD-10-CM | POA: Diagnosis not present

## 2018-02-03 DIAGNOSIS — M48061 Spinal stenosis, lumbar region without neurogenic claudication: Secondary | ICD-10-CM | POA: Diagnosis not present

## 2018-02-05 DIAGNOSIS — M19011 Primary osteoarthritis, right shoulder: Secondary | ICD-10-CM | POA: Diagnosis not present

## 2018-02-05 DIAGNOSIS — M5136 Other intervertebral disc degeneration, lumbar region: Secondary | ICD-10-CM | POA: Diagnosis not present

## 2018-02-05 DIAGNOSIS — M810 Age-related osteoporosis without current pathological fracture: Secondary | ICD-10-CM | POA: Diagnosis not present

## 2018-02-05 DIAGNOSIS — M19012 Primary osteoarthritis, left shoulder: Secondary | ICD-10-CM | POA: Diagnosis not present

## 2018-02-05 DIAGNOSIS — M48061 Spinal stenosis, lumbar region without neurogenic claudication: Secondary | ICD-10-CM | POA: Diagnosis not present

## 2018-02-05 DIAGNOSIS — M75101 Unspecified rotator cuff tear or rupture of right shoulder, not specified as traumatic: Secondary | ICD-10-CM | POA: Diagnosis not present

## 2018-02-06 DIAGNOSIS — M19012 Primary osteoarthritis, left shoulder: Secondary | ICD-10-CM | POA: Diagnosis not present

## 2018-02-06 DIAGNOSIS — M5136 Other intervertebral disc degeneration, lumbar region: Secondary | ICD-10-CM | POA: Diagnosis not present

## 2018-02-06 DIAGNOSIS — M75101 Unspecified rotator cuff tear or rupture of right shoulder, not specified as traumatic: Secondary | ICD-10-CM | POA: Diagnosis not present

## 2018-02-06 DIAGNOSIS — M810 Age-related osteoporosis without current pathological fracture: Secondary | ICD-10-CM | POA: Diagnosis not present

## 2018-02-06 DIAGNOSIS — M48061 Spinal stenosis, lumbar region without neurogenic claudication: Secondary | ICD-10-CM | POA: Diagnosis not present

## 2018-02-06 DIAGNOSIS — M19011 Primary osteoarthritis, right shoulder: Secondary | ICD-10-CM | POA: Diagnosis not present

## 2018-02-10 DIAGNOSIS — M810 Age-related osteoporosis without current pathological fracture: Secondary | ICD-10-CM | POA: Diagnosis not present

## 2018-02-10 DIAGNOSIS — M19011 Primary osteoarthritis, right shoulder: Secondary | ICD-10-CM | POA: Diagnosis not present

## 2018-02-10 DIAGNOSIS — M48061 Spinal stenosis, lumbar region without neurogenic claudication: Secondary | ICD-10-CM | POA: Diagnosis not present

## 2018-02-10 DIAGNOSIS — M5136 Other intervertebral disc degeneration, lumbar region: Secondary | ICD-10-CM | POA: Diagnosis not present

## 2018-02-10 DIAGNOSIS — M75101 Unspecified rotator cuff tear or rupture of right shoulder, not specified as traumatic: Secondary | ICD-10-CM | POA: Diagnosis not present

## 2018-02-10 DIAGNOSIS — M19012 Primary osteoarthritis, left shoulder: Secondary | ICD-10-CM | POA: Diagnosis not present

## 2018-02-11 ENCOUNTER — Ambulatory Visit
Admission: RE | Admit: 2018-02-11 | Discharge: 2018-02-11 | Disposition: A | Discharge status: Home / Self Care | Payer: Medicare Other | Source: Ambulatory Visit | Attending: Gastroenterology | Admitting: Gastroenterology

## 2018-02-11 ENCOUNTER — Other Ambulatory Visit: Payer: Self-pay | Admitting: Gastroenterology

## 2018-02-11 DIAGNOSIS — R140 Abdominal distension (gaseous): Secondary | ICD-10-CM | POA: Diagnosis not present

## 2018-02-11 DIAGNOSIS — K5900 Constipation, unspecified: Secondary | ICD-10-CM | POA: Diagnosis not present

## 2018-02-11 DIAGNOSIS — R1030 Lower abdominal pain, unspecified: Secondary | ICD-10-CM | POA: Diagnosis not present

## 2018-02-12 DIAGNOSIS — M19012 Primary osteoarthritis, left shoulder: Secondary | ICD-10-CM | POA: Diagnosis not present

## 2018-02-12 DIAGNOSIS — M19011 Primary osteoarthritis, right shoulder: Secondary | ICD-10-CM | POA: Diagnosis not present

## 2018-02-12 DIAGNOSIS — M810 Age-related osteoporosis without current pathological fracture: Secondary | ICD-10-CM | POA: Diagnosis not present

## 2018-02-12 DIAGNOSIS — M48061 Spinal stenosis, lumbar region without neurogenic claudication: Secondary | ICD-10-CM | POA: Diagnosis not present

## 2018-02-12 DIAGNOSIS — M5136 Other intervertebral disc degeneration, lumbar region: Secondary | ICD-10-CM | POA: Diagnosis not present

## 2018-02-12 DIAGNOSIS — M75101 Unspecified rotator cuff tear or rupture of right shoulder, not specified as traumatic: Secondary | ICD-10-CM | POA: Diagnosis not present

## 2018-02-14 DIAGNOSIS — M19011 Primary osteoarthritis, right shoulder: Secondary | ICD-10-CM | POA: Diagnosis not present

## 2018-02-14 DIAGNOSIS — M75101 Unspecified rotator cuff tear or rupture of right shoulder, not specified as traumatic: Secondary | ICD-10-CM | POA: Diagnosis not present

## 2018-02-14 DIAGNOSIS — M5136 Other intervertebral disc degeneration, lumbar region: Secondary | ICD-10-CM | POA: Diagnosis not present

## 2018-02-14 DIAGNOSIS — M48061 Spinal stenosis, lumbar region without neurogenic claudication: Secondary | ICD-10-CM | POA: Diagnosis not present

## 2018-02-14 DIAGNOSIS — M19012 Primary osteoarthritis, left shoulder: Secondary | ICD-10-CM | POA: Diagnosis not present

## 2018-02-14 DIAGNOSIS — M810 Age-related osteoporosis without current pathological fracture: Secondary | ICD-10-CM | POA: Diagnosis not present

## 2018-02-16 DIAGNOSIS — M75101 Unspecified rotator cuff tear or rupture of right shoulder, not specified as traumatic: Secondary | ICD-10-CM | POA: Diagnosis not present

## 2018-02-16 DIAGNOSIS — M48061 Spinal stenosis, lumbar region without neurogenic claudication: Secondary | ICD-10-CM | POA: Diagnosis not present

## 2018-02-16 DIAGNOSIS — M810 Age-related osteoporosis without current pathological fracture: Secondary | ICD-10-CM | POA: Diagnosis not present

## 2018-02-16 DIAGNOSIS — M19011 Primary osteoarthritis, right shoulder: Secondary | ICD-10-CM | POA: Diagnosis not present

## 2018-02-16 DIAGNOSIS — M19012 Primary osteoarthritis, left shoulder: Secondary | ICD-10-CM | POA: Diagnosis not present

## 2018-02-16 DIAGNOSIS — M5136 Other intervertebral disc degeneration, lumbar region: Secondary | ICD-10-CM | POA: Diagnosis not present

## 2018-02-17 ENCOUNTER — Ambulatory Visit (INDEPENDENT_AMBULATORY_CARE_PROVIDER_SITE_OTHER): Payer: Medicare Other

## 2018-02-17 DIAGNOSIS — Z5181 Encounter for therapeutic drug level monitoring: Secondary | ICD-10-CM

## 2018-02-17 DIAGNOSIS — Z961 Presence of intraocular lens: Secondary | ICD-10-CM | POA: Diagnosis not present

## 2018-02-17 DIAGNOSIS — H2512 Age-related nuclear cataract, left eye: Secondary | ICD-10-CM | POA: Diagnosis not present

## 2018-02-17 DIAGNOSIS — I4891 Unspecified atrial fibrillation: Secondary | ICD-10-CM | POA: Diagnosis not present

## 2018-02-17 LAB — POCT INR: INR: 3.2 — AB (ref 2.0–3.0)

## 2018-02-17 NOTE — Patient Instructions (Signed)
Please skip coumadin tonight, then START NEW DOSAGE 1 tablet daily except 1/2 tablet on Sundays & Thursdays.  Recheck INR in 3 weeks.  Call us with any medication changes or concerns # [phone].

## 2018-02-18 DIAGNOSIS — M810 Age-related osteoporosis without current pathological fracture: Secondary | ICD-10-CM | POA: Diagnosis not present

## 2018-02-18 DIAGNOSIS — M48061 Spinal stenosis, lumbar region without neurogenic claudication: Secondary | ICD-10-CM | POA: Diagnosis not present

## 2018-02-18 DIAGNOSIS — M75101 Unspecified rotator cuff tear or rupture of right shoulder, not specified as traumatic: Secondary | ICD-10-CM | POA: Diagnosis not present

## 2018-02-18 DIAGNOSIS — M5136 Other intervertebral disc degeneration, lumbar region: Secondary | ICD-10-CM | POA: Diagnosis not present

## 2018-02-18 DIAGNOSIS — M19012 Primary osteoarthritis, left shoulder: Secondary | ICD-10-CM | POA: Diagnosis not present

## 2018-02-18 DIAGNOSIS — M19011 Primary osteoarthritis, right shoulder: Secondary | ICD-10-CM | POA: Diagnosis not present

## 2018-02-20 DIAGNOSIS — M75101 Unspecified rotator cuff tear or rupture of right shoulder, not specified as traumatic: Secondary | ICD-10-CM | POA: Diagnosis not present

## 2018-02-20 DIAGNOSIS — M19011 Primary osteoarthritis, right shoulder: Secondary | ICD-10-CM | POA: Diagnosis not present

## 2018-02-20 DIAGNOSIS — M48061 Spinal stenosis, lumbar region without neurogenic claudication: Secondary | ICD-10-CM | POA: Diagnosis not present

## 2018-02-20 DIAGNOSIS — M810 Age-related osteoporosis without current pathological fracture: Secondary | ICD-10-CM | POA: Diagnosis not present

## 2018-02-20 DIAGNOSIS — M5136 Other intervertebral disc degeneration, lumbar region: Secondary | ICD-10-CM | POA: Diagnosis not present

## 2018-02-20 DIAGNOSIS — M19012 Primary osteoarthritis, left shoulder: Secondary | ICD-10-CM | POA: Diagnosis not present

## 2018-02-24 DIAGNOSIS — M810 Age-related osteoporosis without current pathological fracture: Secondary | ICD-10-CM | POA: Diagnosis not present

## 2018-02-24 DIAGNOSIS — M5136 Other intervertebral disc degeneration, lumbar region: Secondary | ICD-10-CM | POA: Diagnosis not present

## 2018-02-24 DIAGNOSIS — M19011 Primary osteoarthritis, right shoulder: Secondary | ICD-10-CM | POA: Diagnosis not present

## 2018-02-24 DIAGNOSIS — M75101 Unspecified rotator cuff tear or rupture of right shoulder, not specified as traumatic: Secondary | ICD-10-CM | POA: Diagnosis not present

## 2018-02-24 DIAGNOSIS — M19012 Primary osteoarthritis, left shoulder: Secondary | ICD-10-CM | POA: Diagnosis not present

## 2018-02-24 DIAGNOSIS — M48061 Spinal stenosis, lumbar region without neurogenic claudication: Secondary | ICD-10-CM | POA: Diagnosis not present

## 2018-02-25 DIAGNOSIS — M19011 Primary osteoarthritis, right shoulder: Secondary | ICD-10-CM | POA: Diagnosis not present

## 2018-02-25 DIAGNOSIS — M75101 Unspecified rotator cuff tear or rupture of right shoulder, not specified as traumatic: Secondary | ICD-10-CM | POA: Diagnosis not present

## 2018-02-25 DIAGNOSIS — M5136 Other intervertebral disc degeneration, lumbar region: Secondary | ICD-10-CM | POA: Diagnosis not present

## 2018-02-25 DIAGNOSIS — M810 Age-related osteoporosis without current pathological fracture: Secondary | ICD-10-CM | POA: Diagnosis not present

## 2018-02-25 DIAGNOSIS — M19012 Primary osteoarthritis, left shoulder: Secondary | ICD-10-CM | POA: Diagnosis not present

## 2018-02-25 DIAGNOSIS — M48061 Spinal stenosis, lumbar region without neurogenic claudication: Secondary | ICD-10-CM | POA: Diagnosis not present

## 2018-03-03 DIAGNOSIS — M48061 Spinal stenosis, lumbar region without neurogenic claudication: Secondary | ICD-10-CM | POA: Diagnosis not present

## 2018-03-03 DIAGNOSIS — M75101 Unspecified rotator cuff tear or rupture of right shoulder, not specified as traumatic: Secondary | ICD-10-CM | POA: Diagnosis not present

## 2018-03-03 DIAGNOSIS — M19012 Primary osteoarthritis, left shoulder: Secondary | ICD-10-CM | POA: Diagnosis not present

## 2018-03-03 DIAGNOSIS — M5136 Other intervertebral disc degeneration, lumbar region: Secondary | ICD-10-CM | POA: Diagnosis not present

## 2018-03-03 DIAGNOSIS — M810 Age-related osteoporosis without current pathological fracture: Secondary | ICD-10-CM | POA: Diagnosis not present

## 2018-03-03 DIAGNOSIS — M19011 Primary osteoarthritis, right shoulder: Secondary | ICD-10-CM | POA: Diagnosis not present

## 2018-03-05 DIAGNOSIS — I10 Essential (primary) hypertension: Secondary | ICD-10-CM | POA: Diagnosis not present

## 2018-03-05 DIAGNOSIS — Z7952 Long term (current) use of systemic steroids: Secondary | ICD-10-CM | POA: Diagnosis not present

## 2018-03-05 DIAGNOSIS — M19012 Primary osteoarthritis, left shoulder: Secondary | ICD-10-CM | POA: Diagnosis not present

## 2018-03-05 DIAGNOSIS — M810 Age-related osteoporosis without current pathological fracture: Secondary | ICD-10-CM | POA: Diagnosis not present

## 2018-03-05 DIAGNOSIS — L89212 Pressure ulcer of right hip, stage 2: Secondary | ICD-10-CM | POA: Diagnosis not present

## 2018-03-05 DIAGNOSIS — H547 Unspecified visual loss: Secondary | ICD-10-CM | POA: Diagnosis not present

## 2018-03-05 DIAGNOSIS — M19011 Primary osteoarthritis, right shoulder: Secondary | ICD-10-CM | POA: Diagnosis not present

## 2018-03-05 DIAGNOSIS — Z7901 Long term (current) use of anticoagulants: Secondary | ICD-10-CM | POA: Diagnosis not present

## 2018-03-05 DIAGNOSIS — M5136 Other intervertebral disc degeneration, lumbar region: Secondary | ICD-10-CM | POA: Diagnosis not present

## 2018-03-05 DIAGNOSIS — Z9181 History of falling: Secondary | ICD-10-CM | POA: Diagnosis not present

## 2018-03-05 DIAGNOSIS — M48061 Spinal stenosis, lumbar region without neurogenic claudication: Secondary | ICD-10-CM | POA: Diagnosis not present

## 2018-03-05 DIAGNOSIS — M75101 Unspecified rotator cuff tear or rupture of right shoulder, not specified as traumatic: Secondary | ICD-10-CM | POA: Diagnosis not present

## 2018-03-06 DIAGNOSIS — L89212 Pressure ulcer of right hip, stage 2: Secondary | ICD-10-CM | POA: Diagnosis not present

## 2018-03-06 DIAGNOSIS — M48061 Spinal stenosis, lumbar region without neurogenic claudication: Secondary | ICD-10-CM | POA: Diagnosis not present

## 2018-03-06 DIAGNOSIS — M19011 Primary osteoarthritis, right shoulder: Secondary | ICD-10-CM | POA: Diagnosis not present

## 2018-03-06 DIAGNOSIS — M5136 Other intervertebral disc degeneration, lumbar region: Secondary | ICD-10-CM | POA: Diagnosis not present

## 2018-03-06 DIAGNOSIS — M19012 Primary osteoarthritis, left shoulder: Secondary | ICD-10-CM | POA: Diagnosis not present

## 2018-03-06 DIAGNOSIS — M75101 Unspecified rotator cuff tear or rupture of right shoulder, not specified as traumatic: Secondary | ICD-10-CM | POA: Diagnosis not present

## 2018-03-06 DIAGNOSIS — R030 Elevated blood-pressure reading, without diagnosis of hypertension: Secondary | ICD-10-CM | POA: Diagnosis not present

## 2018-03-10 ENCOUNTER — Ambulatory Visit (INDEPENDENT_AMBULATORY_CARE_PROVIDER_SITE_OTHER): Payer: Medicare Other | Admitting: Pharmacist

## 2018-03-10 DIAGNOSIS — M19012 Primary osteoarthritis, left shoulder: Secondary | ICD-10-CM | POA: Diagnosis not present

## 2018-03-10 DIAGNOSIS — M19011 Primary osteoarthritis, right shoulder: Secondary | ICD-10-CM | POA: Diagnosis not present

## 2018-03-10 DIAGNOSIS — M48061 Spinal stenosis, lumbar region without neurogenic claudication: Secondary | ICD-10-CM | POA: Diagnosis not present

## 2018-03-10 DIAGNOSIS — Z5181 Encounter for therapeutic drug level monitoring: Secondary | ICD-10-CM | POA: Diagnosis not present

## 2018-03-10 DIAGNOSIS — M5136 Other intervertebral disc degeneration, lumbar region: Secondary | ICD-10-CM | POA: Diagnosis not present

## 2018-03-10 DIAGNOSIS — I4891 Unspecified atrial fibrillation: Secondary | ICD-10-CM

## 2018-03-10 DIAGNOSIS — L89212 Pressure ulcer of right hip, stage 2: Secondary | ICD-10-CM | POA: Diagnosis not present

## 2018-03-10 DIAGNOSIS — M75101 Unspecified rotator cuff tear or rupture of right shoulder, not specified as traumatic: Secondary | ICD-10-CM | POA: Diagnosis not present

## 2018-03-10 LAB — POCT INR: INR: 2.7 (ref 2.0–3.0)

## 2018-03-10 NOTE — Patient Instructions (Signed)
Description   Continue 1 tablet daily except 1/2 tablet on Sundays & Thursdays.  Recheck INR in 4 weeks.  Call us with any medication changes or concerns # [phone].

## 2018-03-17 DIAGNOSIS — M48061 Spinal stenosis, lumbar region without neurogenic claudication: Secondary | ICD-10-CM | POA: Diagnosis not present

## 2018-03-17 DIAGNOSIS — M5136 Other intervertebral disc degeneration, lumbar region: Secondary | ICD-10-CM | POA: Diagnosis not present

## 2018-03-17 DIAGNOSIS — M19011 Primary osteoarthritis, right shoulder: Secondary | ICD-10-CM | POA: Diagnosis not present

## 2018-03-17 DIAGNOSIS — L89212 Pressure ulcer of right hip, stage 2: Secondary | ICD-10-CM | POA: Diagnosis not present

## 2018-03-17 DIAGNOSIS — M75101 Unspecified rotator cuff tear or rupture of right shoulder, not specified as traumatic: Secondary | ICD-10-CM | POA: Diagnosis not present

## 2018-03-17 DIAGNOSIS — M19012 Primary osteoarthritis, left shoulder: Secondary | ICD-10-CM | POA: Diagnosis not present

## 2018-03-20 DIAGNOSIS — M75101 Unspecified rotator cuff tear or rupture of right shoulder, not specified as traumatic: Secondary | ICD-10-CM | POA: Diagnosis not present

## 2018-03-20 DIAGNOSIS — M19012 Primary osteoarthritis, left shoulder: Secondary | ICD-10-CM | POA: Diagnosis not present

## 2018-03-20 DIAGNOSIS — M5136 Other intervertebral disc degeneration, lumbar region: Secondary | ICD-10-CM | POA: Diagnosis not present

## 2018-03-20 DIAGNOSIS — M19011 Primary osteoarthritis, right shoulder: Secondary | ICD-10-CM | POA: Diagnosis not present

## 2018-03-20 DIAGNOSIS — H60501 Unspecified acute noninfective otitis externa, right ear: Secondary | ICD-10-CM | POA: Diagnosis not present

## 2018-03-20 DIAGNOSIS — I4891 Unspecified atrial fibrillation: Secondary | ICD-10-CM | POA: Diagnosis not present

## 2018-03-20 DIAGNOSIS — L89212 Pressure ulcer of right hip, stage 2: Secondary | ICD-10-CM | POA: Diagnosis not present

## 2018-03-20 DIAGNOSIS — M48061 Spinal stenosis, lumbar region without neurogenic claudication: Secondary | ICD-10-CM | POA: Diagnosis not present

## 2018-03-20 DIAGNOSIS — Z7189 Other specified counseling: Secondary | ICD-10-CM | POA: Diagnosis not present

## 2018-03-23 DIAGNOSIS — M25571 Pain in right ankle and joints of right foot: Secondary | ICD-10-CM | POA: Diagnosis not present

## 2018-03-23 DIAGNOSIS — M25511 Pain in right shoulder: Secondary | ICD-10-CM | POA: Diagnosis not present

## 2018-03-23 DIAGNOSIS — M545 Low back pain: Secondary | ICD-10-CM | POA: Diagnosis not present

## 2018-03-23 DIAGNOSIS — M25512 Pain in left shoulder: Secondary | ICD-10-CM | POA: Diagnosis not present

## 2018-03-30 ENCOUNTER — Other Ambulatory Visit: Payer: Self-pay | Admitting: Interventional Cardiology

## 2018-04-07 ENCOUNTER — Telehealth: Payer: Self-pay

## 2018-04-07 NOTE — Telephone Encounter (Signed)
Patient is returning your call.  

## 2018-04-07 NOTE — Telephone Encounter (Signed)

## 2018-04-07 NOTE — Telephone Encounter (Signed)
LMOM FOR PRESCREEN/DRIVE THRU 

## 2018-04-09 ENCOUNTER — Other Ambulatory Visit: Payer: Self-pay

## 2018-04-09 ENCOUNTER — Ambulatory Visit (INDEPENDENT_AMBULATORY_CARE_PROVIDER_SITE_OTHER): Payer: Medicare Other | Admitting: Pharmacist

## 2018-04-09 DIAGNOSIS — Z5181 Encounter for therapeutic drug level monitoring: Secondary | ICD-10-CM | POA: Diagnosis not present

## 2018-04-09 DIAGNOSIS — I4891 Unspecified atrial fibrillation: Secondary | ICD-10-CM | POA: Diagnosis not present

## 2018-04-09 LAB — POCT INR: INR: 2.5 (ref 2.0–3.0)

## 2018-04-13 ENCOUNTER — Telehealth: Payer: Self-pay | Admitting: Interventional Cardiology

## 2018-04-13 NOTE — Telephone Encounter (Signed)
Do you feel appropriate to switch to Eliquis or Xarelto?  If so, what dose?

## 2018-04-13 NOTE — Telephone Encounter (Signed)
New Message         Patient's daughter Mardene Celeste) is calling in today to get Dr. Thompson Caul advise on her mother switching to another medication so that she will not have to come as much to get her comuundian checked. The comundian staff mentioned this to the patient. Patient is asking for advise before she does this. Pls advise.

## 2018-04-14 NOTE — Telephone Encounter (Signed)
On last OV she weighed 57 KG, is > 80, and has Creat 1.4. She has 2 of 3 risk factors (age and wt. < 60 kg) that requires reduced dose of 2.5 mg BID. Okay to switch. Stop coumadin for 2 days then start Eliquis.

## 2018-04-15 ENCOUNTER — Ambulatory Visit: Payer: Medicare Other | Admitting: Podiatry

## 2018-04-16 MED ORDER — APIXABAN 2.5 MG PO TABS: 2.5000 mg | ORAL_TABLET | Freq: Two times a day (BID) | ORAL | 11 refills | Status: DC

## 2018-04-16 NOTE — Telephone Encounter (Signed)
Spoke with Daughter about transition. Answered all of her questions about Eliquis. She thinks that they will likely switch, but she wants to run this by her siblings and mother and will call back if they choose to make the change.

## 2018-04-16 NOTE — Telephone Encounter (Signed)
[name] called back and they would like to proceed with Eliquis. RX sent to pharmacy as requested by daughter. They are aware to call with any questions

## 2018-04-22 ENCOUNTER — Ambulatory Visit: Payer: Medicare Other | Admitting: Podiatry

## 2018-04-30 DIAGNOSIS — H60331 Swimmer's ear, right ear: Secondary | ICD-10-CM | POA: Diagnosis not present

## 2018-04-30 DIAGNOSIS — Z974 Presence of external hearing-aid: Secondary | ICD-10-CM | POA: Diagnosis not present

## 2018-04-30 DIAGNOSIS — H9113 Presbycusis, bilateral: Secondary | ICD-10-CM | POA: Diagnosis not present

## 2018-05-20 ENCOUNTER — Ambulatory Visit: Payer: Medicare Other | Admitting: Podiatry

## 2018-06-09 DIAGNOSIS — M5432 Sciatica, left side: Secondary | ICD-10-CM | POA: Diagnosis not present

## 2018-06-09 DIAGNOSIS — Z8719 Personal history of other diseases of the digestive system: Secondary | ICD-10-CM | POA: Diagnosis not present

## 2018-06-09 DIAGNOSIS — N183 Chronic kidney disease, stage 3 (moderate): Secondary | ICD-10-CM | POA: Diagnosis not present

## 2018-06-09 DIAGNOSIS — I4891 Unspecified atrial fibrillation: Secondary | ICD-10-CM | POA: Diagnosis not present

## 2018-06-09 DIAGNOSIS — E039 Hypothyroidism, unspecified: Secondary | ICD-10-CM | POA: Diagnosis not present

## 2018-06-09 DIAGNOSIS — Z87440 Personal history of urinary (tract) infections: Secondary | ICD-10-CM | POA: Diagnosis not present

## 2018-06-09 DIAGNOSIS — M069 Rheumatoid arthritis, unspecified: Secondary | ICD-10-CM | POA: Diagnosis not present

## 2018-06-09 DIAGNOSIS — K5900 Constipation, unspecified: Secondary | ICD-10-CM | POA: Diagnosis not present

## 2018-06-09 DIAGNOSIS — E782 Mixed hyperlipidemia: Secondary | ICD-10-CM | POA: Diagnosis not present

## 2018-06-09 DIAGNOSIS — Z8669 Personal history of other diseases of the nervous system and sense organs: Secondary | ICD-10-CM | POA: Diagnosis not present

## 2018-06-09 DIAGNOSIS — I509 Heart failure, unspecified: Secondary | ICD-10-CM | POA: Diagnosis not present

## 2018-06-09 DIAGNOSIS — K529 Noninfective gastroenteritis and colitis, unspecified: Secondary | ICD-10-CM | POA: Diagnosis not present

## 2018-06-18 DIAGNOSIS — M25512 Pain in left shoulder: Secondary | ICD-10-CM | POA: Diagnosis not present

## 2018-06-18 DIAGNOSIS — M25511 Pain in right shoulder: Secondary | ICD-10-CM | POA: Diagnosis not present

## 2018-06-23 DIAGNOSIS — H60331 Swimmer's ear, right ear: Secondary | ICD-10-CM | POA: Diagnosis not present

## 2018-06-23 DIAGNOSIS — H9113 Presbycusis, bilateral: Secondary | ICD-10-CM | POA: Diagnosis not present

## 2018-06-23 DIAGNOSIS — H6061 Unspecified chronic otitis externa, right ear: Secondary | ICD-10-CM | POA: Diagnosis not present

## 2018-07-08 ENCOUNTER — Ambulatory Visit: Payer: PRIVATE HEALTH INSURANCE | Admitting: Podiatry

## 2018-07-27 ENCOUNTER — Telehealth: Payer: Self-pay | Admitting: Interventional Cardiology

## 2018-07-27 NOTE — Telephone Encounter (Signed)
New Message   Patients daughter is calling on behalf of her mother. She states that her mother was switched to Eliquis and she wants to know does she need lab work prior to the appt.

## 2018-07-27 NOTE — Telephone Encounter (Signed)
Returned call to patient's daughter - states pt is having lab work drawn at her PCP at the end of August. Called over to her PCP Dr Moreen Fowler to ensure they can check BMET and CBC with her labs. Spoke with RN who states they will add on labs and call back with any concerns.

## 2018-08-04 DIAGNOSIS — H9201 Otalgia, right ear: Secondary | ICD-10-CM | POA: Diagnosis not present

## 2018-08-06 DIAGNOSIS — H61031 Chondritis of right external ear: Secondary | ICD-10-CM | POA: Diagnosis not present

## 2018-08-07 ENCOUNTER — Ambulatory Visit: Payer: Medicare Other | Admitting: Podiatry

## 2018-08-10 DIAGNOSIS — M5416 Radiculopathy, lumbar region: Secondary | ICD-10-CM | POA: Diagnosis not present

## 2018-08-10 DIAGNOSIS — H9201 Otalgia, right ear: Secondary | ICD-10-CM | POA: Diagnosis not present

## 2018-08-10 DIAGNOSIS — M0609 Rheumatoid arthritis without rheumatoid factor, multiple sites: Secondary | ICD-10-CM | POA: Diagnosis not present

## 2018-08-10 DIAGNOSIS — M150 Primary generalized (osteo)arthritis: Secondary | ICD-10-CM | POA: Diagnosis not present

## 2018-08-19 DIAGNOSIS — G6289 Other specified polyneuropathies: Secondary | ICD-10-CM | POA: Diagnosis not present

## 2018-08-19 DIAGNOSIS — R42 Dizziness and giddiness: Secondary | ICD-10-CM | POA: Diagnosis not present

## 2018-08-19 DIAGNOSIS — R51 Headache: Secondary | ICD-10-CM | POA: Diagnosis not present

## 2018-08-19 DIAGNOSIS — H9201 Otalgia, right ear: Secondary | ICD-10-CM | POA: Diagnosis not present

## 2018-08-21 ENCOUNTER — Other Ambulatory Visit: Payer: Self-pay | Admitting: Family Medicine

## 2018-08-21 DIAGNOSIS — R519 Headache, unspecified: Secondary | ICD-10-CM

## 2018-08-21 DIAGNOSIS — R42 Dizziness and giddiness: Secondary | ICD-10-CM

## 2018-08-24 ENCOUNTER — Ambulatory Visit
Admission: RE | Admit: 2018-08-24 | Discharge: 2018-08-24 | Disposition: A | Discharge status: Home / Self Care | Payer: Medicare Other | Source: Ambulatory Visit | Attending: Family Medicine | Admitting: Family Medicine

## 2018-08-24 ENCOUNTER — Other Ambulatory Visit: Payer: Self-pay

## 2018-08-24 ENCOUNTER — Other Ambulatory Visit: Payer: Self-pay | Admitting: Family Medicine

## 2018-08-24 DIAGNOSIS — R42 Dizziness and giddiness: Secondary | ICD-10-CM | POA: Diagnosis not present

## 2018-08-24 DIAGNOSIS — R519 Headache, unspecified: Secondary | ICD-10-CM

## 2018-08-24 MED ORDER — GADOBENATE DIMEGLUMINE 529 MG/ML IV SOLN
5.0000 mL | Freq: Once | INTRAVENOUS | Status: AC | PRN
  Administered 2018-08-24: 5 mL via INTRAVENOUS

## 2018-08-25 ENCOUNTER — Ambulatory Visit: Payer: Medicare Other | Admitting: Podiatry

## 2018-08-25 ENCOUNTER — Other Ambulatory Visit: Payer: Self-pay | Admitting: Family Medicine

## 2018-08-25 DIAGNOSIS — G6289 Other specified polyneuropathies: Secondary | ICD-10-CM | POA: Diagnosis not present

## 2018-08-25 DIAGNOSIS — R519 Headache, unspecified: Secondary | ICD-10-CM

## 2018-08-25 DIAGNOSIS — R42 Dizziness and giddiness: Secondary | ICD-10-CM

## 2018-08-25 DIAGNOSIS — H9201 Otalgia, right ear: Secondary | ICD-10-CM | POA: Diagnosis not present

## 2018-08-25 DIAGNOSIS — R220 Localized swelling, mass and lump, head: Secondary | ICD-10-CM | POA: Diagnosis not present

## 2018-08-25 DIAGNOSIS — R51 Headache: Secondary | ICD-10-CM | POA: Diagnosis not present

## 2018-08-26 ENCOUNTER — Encounter: Payer: Self-pay | Admitting: Neurology

## 2018-08-28 ENCOUNTER — Inpatient Hospital Stay: Admission: RE | Admit: 2018-08-28 | Payer: Medicare Other | Source: Ambulatory Visit

## 2018-08-28 ENCOUNTER — Other Ambulatory Visit: Payer: Self-pay | Admitting: Family Medicine

## 2018-08-28 ENCOUNTER — Other Ambulatory Visit: Payer: Medicare Other

## 2018-08-28 ENCOUNTER — Ambulatory Visit
Admission: RE | Admit: 2018-08-28 | Discharge: 2018-08-28 | Disposition: A | Discharge status: Home / Self Care | Payer: Medicare Other | Source: Ambulatory Visit | Attending: Family Medicine | Admitting: Family Medicine

## 2018-08-28 DIAGNOSIS — R220 Localized swelling, mass and lump, head: Secondary | ICD-10-CM | POA: Diagnosis not present

## 2018-08-28 DIAGNOSIS — R519 Headache, unspecified: Secondary | ICD-10-CM

## 2018-08-28 DIAGNOSIS — R51 Headache: Secondary | ICD-10-CM | POA: Diagnosis not present

## 2018-08-31 ENCOUNTER — Other Ambulatory Visit: Payer: Self-pay

## 2018-08-31 ENCOUNTER — Encounter: Payer: Self-pay | Admitting: Neurology

## 2018-08-31 ENCOUNTER — Ambulatory Visit (INDEPENDENT_AMBULATORY_CARE_PROVIDER_SITE_OTHER): Payer: Medicare Other | Admitting: Neurology

## 2018-08-31 VITALS — BP 152/67 | HR 56

## 2018-08-31 DIAGNOSIS — R51 Headache: Secondary | ICD-10-CM

## 2018-08-31 DIAGNOSIS — R42 Dizziness and giddiness: Secondary | ICD-10-CM

## 2018-08-31 DIAGNOSIS — R519 Headache, unspecified: Secondary | ICD-10-CM

## 2018-08-31 NOTE — Patient Instructions (Signed)
Unfortunately, I cannot think of any other recommendations for your right-sided headaches and your spinning sensation.  Please talk to Dr. Constance Holster about the possibility of doing vestibular rehab which is through physical therapy typically.  It may help your spinning sensation.  I reviewed your brain MRI which showed age-appropriate findings, nothing to suggest any lesion or abnormal contrast uptake thankfully.  I would not recommend increasing the gabapentin further, this can Affect your balance and increase her fall risk.  Please follow-up with your primary care physician and rheumatologist, as scheduled.  I am glad to hear that your headaches are better since you have nearly completed your steroid course. If your headaches return, we can consider a referral to a headache specialty center, such as at [facility] or [facility].

## 2018-08-31 NOTE — Progress Notes (Signed)
Subjective:    Patient ID: [name] is a 85 y.o. female.  HPI     Star Age, MD, PhD [facility] [address] [place], [place] [address]  Dear Dr. Constance Holster, I saw your patient [name], upon your kind request to my neurologic clinic today for evaluation of her right ear pain.  The patient is accompanied by her daughter today, as you know, [name] is a 85 year old right-handed woman with an underlying medical history of A. fib, thyroid disease, history of giant cell arteritis, history of DVT and PE, with status post [place] filter placement, history of spinal stenosis, status post bilateral knee replacement surgeries, tremor, neuropathy, macular degeneration, hearing loss, history of dizziness, and gait disorder, who reports intermittent right-sided headaches and ear pain for the past 4 to 5 months.  She has been seeing rheumatology.  She has recently started a course of oral steroid and in the past few days her headaches are better.  She does have a spinning sensation which feels different from her previous vertigo spells.  She has had pain behind her eyes.  She did have some soreness in the right temporal area temporarily but this improved.  She has a remote history of temporal arteritis.  She has not had any falls recently.  She walks with a walker and with assistance at the house.  I reviewed your office records.  She has been treated for otitis externa.  She is followed by rheumatology for osteoarthritis and rheumatoid arthritis.  She was recently placed on a course of prednisone.  She had blood work through her rheumatologist on 08/10/2018 and I reviewed the results: CBC with differential showed benign results with the exception of absolute neutrophils borderline at 7.1.  ESR was 2, CRP 4, CMP showed total protein of 5.7 which is borderline low, glucose 140, chloride 90, sodium 131, creatinine 1.04, BUN 19.  ANA was positive.    I have  previously evaluated her for dizziness, gait disorder and right-sided headaches in the past.  Prior to 2015 she had seen Dr. Erling Cruz in our office.    She had a brain MRI with and without contrast on 08/24/2018 and I reviewed the results: IMPRESSION: No acute intracranial abnormality. Age-appropriate volume loss. Mild chronic microvascular ischemia in the white matter and pons.  Prior to that she had a brain MRI without contrast on 11/20/2016: IMPRESSION: Slightly abnormal MRI scan showing age-appropriate changes of chronic microvascular ischemia and generalized cerebral atrophy.  No acute abnormality.  No significant change compared with previous MRI dated 04/26/2014.  For pain, she is currently on tramadol as needed, gabapentin 100 mg strength, 2 pills 3 times daily, this was recently increased by her primary care.  She had a maxillofacial CT without contrast on 08/28/2018 and I reviewed the results: IMPRESSION: No definite abnormality seen in the maxillofacial region.  Previously:  10/30/2016: [name] is a 85 year old right-handed woman with a complex underlying history of atrial fibrillation, thyroid disease, giant cell arteritis, DVT with PE, hysterectomy, status post Green[place] filter placement, spinal stenosis with surgery in 2000, right knee replacement surgery in 2001, left knee replacement surgery in 2002, essential tremor, peripheral neuropathy, macular degeneration, and hearing loss, gait disorder, and he central tremor, who presents for a new problem of dizziness. The patient is accompanied by her daughter again today. I last saw her on 04/25/2016, at which time she was feeling stable, no recent fall, she was walking with her  walker quite well. She was trying to hydrate well. I suggested as needed follow-up.   I saw her on 10/24/2015, at which time she reported doing okay. She had 2 interim ER visits which I reviewed: on 03/25/2015 secondary to abdominal pain and was found to have partial  small bowel obstruction, which was managed as an outpatient; 03/31/2015 for right hand swelling and had an x-ray of the right hand, 3 views, on 03/31/2015: IMPRESSION:  No evidence of fracture or dislocation. Mild degenerative change at the first carpometacarpal joint, and at the ulnar aspect of the carpal rows.   She was diagnosed with rheumatoid arthritis and first saw orthopedics, then rheumatology. She was placed on a steroid taper and then on maintenance prednisone 5 mg daily. She developed severe left hip pain which was gradually improving. She had no recent falls but a near fall. She felt that her tremor was slightly worse.    I saw her on 03/23/2015, at which time she reported more weakness in her lower extremities and lower back pain. She was trying to do exercises sitting down and was walking with her walker. She had cataract surgery on the right. She did have a recent fall and bumped her head. She had no loss of consciousness and no headache, she felt that her tremor was worse as well.    I saw her on 09/22/2014, at which time she reported that her headaches on the R side had resolved. She saw another dentist and was told to use a hollowed out pillow and was given a mouth piece for TMJ and this had helped. She had intermittent right arm pain and weakness and numbness and grip weakness. She had neck pain and saw her orthopedic doctor, had PT and felt better. I suggested she continue using her walker and drink water. I suggested gentle strengthening and range of motion exercises for her upper body.   I saw her on 04/05/2014, at which time she reported a one month Hx of intermittent dull headache and some sharp shooting pains on the right side of her head. Symptoms were different from when she was diagnosed in the past with giant cell arteritis. Nevertheless, she saw her primary care physician who ordered a head CT. She had a head CT on 03/18/2014: Mild patchy periventricular small vessel disease. No  intracranial mass, hemorrhage, or acute appearing infarct. In addition, I personally reviewed the images through the PACS system. She also reported having had a sedimentation rate and CRP checked both of which were negative for concern for temporal arteritis. She saw her ophthalmologist who did not see any new problems explaining the situation. She had been diagnosed with wet macular degeneration and had injections into both eyes for treatment of this. Symptoms coincidentally started after she had her eye injection, about one week after she had her right I injected. She had her left eye injected a week after the right. She had no new one-sided neurological symptoms such as weakness, numbness, tingling, no facial droop, she did have some joint pain in her TMJ on the right. She saw her dentist. She said he was not concerned. She was advised not to chew on hard foods and try to eat softer foods. I suggested we proceed with a brain MRI contrast, which she had on 04/26/14: Mildly abnormal MRI brain (without) demonstrating: 1. Scattered periventricular and subcortical foci of non-specific gliosis, likely chronic small vessel ischemic disease. 2. No acute findings. In addition, I personally reviewed the images through  the PACS system. We called her with her test results.   I saw her on 09/21/2013, at which time she reported having had a UTI. She had stayed with her daughter. She had not fallen. Nocturia became worse. She was complaining of leg pain. She presents for a sooner than scheduled appointment secondary to new onset jaw pain.   I saw her on 03/18/2013, at which time her daughter reported that the patient was hospitalized for about 10 days in January 2015 for small bowel obstruction and symptoms consisting of abdominal pain, nausea and vomiting. Her hospitalization was complicated by aspiration pneumonia, A. fib, pulmonary embolus, nausea, vomiting, microscopic colitis, leukocytosis, CKD (chronic kidney  disease), stage IV. She had home health therapies and has a emergency call button. She continues to live alone. She has no caregiver at home on a daily basis, but her kids check with her frequently. She has help with cleaning. She cooks at the stove and occasionally uses a knife. Her INR was stable. I talked to her and her daughter at length last time and I was particularly concerned about her continuing to live alone and working in the kitchen and at the stove because she is visually impaired and has had balance problems, gait disorder, abnormal posture and I advised her to have someone at the house for help on a day-to-day basis. She was advised to use her walker at all times. I did not suggest any new medications.   I first met her on 09/08/2012, at which time he felt she had remained fairly stable and I did not suggest any medication changes. My main concern was that she continued to live alone and I talked to her and her daughter about that. Because of her visual and hearing impairment, balance problems, and gait dysfunction and abnormal posture as well as her age I felt that she needed to start a discussion with her family about an alternative living situation. I advised her to use her walker at all times.   She previously followed with Dr. Morene Antu and was last seen by Dr. Erling Cruz on 03/12/2012, at which time he felt that she was stable and that she was doing well living by herself. She no longer drives. She sees Dr. Hardin Negus for pain management. She has had bladder incontinence. She has a long-standing history of peripheral neuropathy. She has a 2 story home. 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 One daughter is in [place], one in [place] and [name] is local and her son lives at . She has a life alert button.  Her Past Medical History Is Significant For: Past Medical History:  Diagnosis Date  . Blindness   . Blood clot associated with vein wall inflammation    . CKD (chronic kidney disease), stage III ([place])   . Colitis   . Goiter    s/p radioactive iod. tx  . Hypothyroidism   . On amiodarone therapy   . Paroxysmal atrial fibrillation (HCC)   . Pulmonary embolus (HCC)    and DVT s/p IVC filter  . SBO (small bowel obstruction) ([place])   . Spinal stenosis of lumbar region with radiculopathy    possible neurogenic claudication recently?  . Temporal arteritis ([place])     Her Past Surgical History Is Significant For: Past Surgical History:  Procedure Laterality Date  . APPENDECTOMY  1940  . BACK SURGERY  04/1998   spinal stenosis  . [place]  Ruptured Disc  . BREAST BIOPSY     cysts in both breasts  . CARPAL TUNNEL RELEASE  2002  . ivc filter    . PARTIAL HYSTERECTOMY    . TONSILLECTOMY  1934  . TOTAL KNEE ARTHROPLASTY Left 04/1999  . TOTAL KNEE ARTHROPLASTY Right 02/2000    Her Family History Is Significant For: Family History  Problem Relation Age of Onset  . Breast cancer Sister   . Congestive Heart Failure Mother   . Stroke Father   . Crohn's disease Neg Hx   . Ulcerative colitis Neg Hx     Her Social History Is Significant For: Social History   Socioeconomic History  . Marital status: Widowed    Spouse name: Not on file  . Number of children: 4  . Years of education: College  . Highest education level: Not on file  Occupational History    Employer: RETIRED    Comment: retired  Scientific laboratory technician  . Financial resource strain: Not on file  . Food insecurity    Worry: Not on file    Inability: Not on file  . Transportation needs    Medical: Not on file    Non-medical: Not on file  Tobacco Use  . Smoking status: Never Smoker  . Smokeless tobacco: Never Used  Substance and Sexual Activity  . Alcohol use: No  . Drug use: No  . Sexual activity: Never  Lifestyle  . Physical activity    Days per week: Not on file    Minutes per session: Not on file  . Stress: Not on file  Relationships  . Social Product manager on phone: Not on file    Gets together: Not on file    Attends religious service: Not on file    Active member of club or organization: Not on file    Attends meetings of clubs or organizations: Not on file    Relationship status: Not on file  Other Topics Concern  . Not on file  Social History Narrative   Patient lives at home alone.  Use walker to get around   Caffeine Use: rarely   Patient is right handed    Her Allergies Are:  Allergies  Allergen Reactions  . Hydrocortisone Other (See Comments)  . Sulfa Antibiotics Nausea Only  . Cefdinir Other (See Comments)    Caused bad diarrhea Caused bad diarrhea  . Cortisone Other (See Comments)    Flushed hands and face  Flushed hands and face  . Prednisone Other (See Comments)    cuased blood clots Can have very small dose cuased blood clots  . Sulfasalazine Other (See Comments)    Nausea side effects Stomach pains and cramping  . Sulfonamide Derivatives     Stomach pains and cramping   :   Her Current Medications Are:  Outpatient Encounter Medications as of 08/31/2018  Medication Sig  . amiodarone (PACERONE) 200 MG tablet TAKE (1/2) TABLET DAILY.  Marland Kitchen apixaban (ELIQUIS) 2.5 MG TABS tablet Take 1 tablet (2.5 mg total) by mouth 2 (two) times daily.  . Calcium Carbonate-Vitamin D (CALCIUM HIGH POTENCY/VITAMIN D) 600-200 MG-UNIT TABS Take by mouth.  Drusilla Kanner EXTRACT PO Take by mouth daily. Take 2 tablets twice a day.  . gabapentin (NEURONTIN) 100 MG capsule Take 200 mg by mouth 3 (three) times daily.   Marland Kitchen levothyroxine (SYNTHROID, LEVOTHROID) 150 MCG tablet Take 150 mcg by mouth daily before breakfast.  . lubiprostone (AMITIZA) 8 MCG capsule  Take 8 mcg by mouth 3 (three) times daily with meals.   . Misc Natural Products (GLUCOSAMINE CHONDROITIN COMPLX) TABS Take 1 tablet by mouth 2 (two) times daily.   . Misc Natural Products (OSTEO BI-FLEX TRIPLE STRENGTH PO) Osteo Bi-Flex Triple Strength  . Multiple  Vitamins-Minerals (OCUVITE ADULT 50+ PO) Take 1 tablet by mouth daily.  . nitrofurantoin (MACRODANTIN) 50 MG capsule Take 50 mg by mouth daily.  . NONFORMULARY OR COMPOUNDED [place]  Anti-Inflammatory Cream- Diclofenac 3%, Baclofen 2%, Lidocaine 2% Apply 1-2 grams to affected area 3-4 times daily Qty. 120 gm 3 refills  . predniSONE (DELTASONE) 5 MG tablet prednisone 5 mg tablet  . Probiotic Product ([place]) Take by mouth.  . traMADol (ULTRAM) 50 MG tablet tramadol 50 mg tablet  Take 1 tablet twice a day by oral route as needed.   No facility-administered encounter medications on file as of 08/31/2018.   :  Review of Systems:  Out of a complete 14 point review of systems, all are reviewed and negative with the exception of these symptoms as listed below: Review of Systems  Neurological:       Pt presents today to discuss her head and eye pain as well as a spinning sensation. She does not have the head/eye pain today but has the spinning sensation. She has these symptoms when she moves her head.    Objective:  Neurological Exam  Physical Exam Physical Examination:   Vitals:   08/31/18 1037  BP: (!) 152/67  Pulse: (!) 56   General Examination: The patient is a very pleasant 85 y.o. female in no acute distress. She appears well-developed and well-nourished and well groomed. She is situated in her wheelchair.  She did not bring her walker. She denies any headache currently.  HEENT:Normocephalic, atraumatic, pupils are equal, round and reactive to light, She is hearing impaired, she is visually impaired. Oropharynx examination reveals: Moderate voice tremor. No significant head or jaw tremor today, no lip tremor.  Tongue protrudes centrally in palate elevates symmetrically, mild mouth dryness noted. Nasal inspection reveals noObvious source, possibly deviated septum to the left, left nostril slightly smaller than right.  Chest:Clear to  auscultation without wheezing, rhonchi or crackles noted.  Heart:S1+S2+0, regular and normal without murmurs, rubs or gallops noted.   Abdomen:Soft, non-tender and non-distended with normal bowel sounds appreciated on auscultation.  Extremities:There is nopitting edema in the distal lower extremities bilaterally. Pedal pulses are intact.  Skin: Warm and dry without trophic changes noted.   Musculoskeletal: exam reveals arthritic changes in both hands. Increase in upper back curvature and some possible scoliosis.   Neurologically:  Mental status: The patient is awake, alert and oriented in all 4 spheres. Herimmediate and remote memory, attention, language skills and fund of knowledge are appropriate for age. Thought process is linear. Mood is normaland affect is normal.  Cranial nerves II - XII are as described above under HEENT exam. Motor exam: Thin bulk, global strength of 4 out of 5, normal tone. Reflexes are 1+, Absent in the ankles. Fine motor skills are mildly impaired globally. She has no significant resting tremor, minimal bilateral upper extremity postural tremor. I did not have her stand or walk for me today, she did not bring her walker.  She is not safe to test Romberg. Sensory exam is intact to light touch.   Assessment and Plan:   In summary, Fredrika Canby Crouchis a very pleasant 85 year old female with a complex medical  history of heart disease including paroxysmal A. fib, pulmonary embolus, peripheral neuropathy, degenerative spine disease, blindness, hearing loss, history of vertigo, essential tremor, and gait disorder who presents for evaluation of Her recurrent right-sided headaches.  Her headaches have improved in the past few days.  She has nearly completed a course of steroids through rheumatology.  Rheumatological labs Recently did not suggest temporal arteritis. She also had a recent increase in her gabapentin to 200 mg 3 times daily.  I explained to the patient  and her daughter that I would not recommend increasing this any further because of fear of side effects including balance issues and increase in fall risk.  I could not safely assess her gait or posture today because she did not bring a walker.  She is at home with help typically and uses a walker inside the house as well as outside and a wheelchair for longer distances.  Thankfully, she has not had a fall.  She had a recent brain MRI with and without contrast which did not show any focal findings or new lesions and was quite benign for age which was reassuring.  She also had a recent maxillofacial CT without contrast which I reviewed. I did not suggest any additional testing, She had extensive blood work recently, she has seen ophthalmology, ENT, dermatology and her primary care physician.  She had imaging testing including maxillofacial CT and brain MRI.  She is encouraged to follow-up with her rheumatologist.  She is furthermore encouraged to talk to about the possibility of doing vestibular rehab through physical therapy for her spinning sensation.  This could be a manifestation of vertigo.  She feels that her symptoms are little bit different than her previous vertigo spells. She has recently seen a retina specialist as well.  If her headaches should flareup again, we can consider a referral to a more comprehensive headache specialty center such as at [facility] or [facility]  I have discussed it with them and they are willing to consider this.  For now, her headaches are thankfully improved.  From my end of things I suggested as needed FU. I answered all their questions today and the patient and her daughter were in agreement.  Thank you very much for allowing me to participate in the care of this nice patient. If I can be of any further assistance to you please do not hesitate to call me at [phone].  Sincerely,   Star Age, MD, PhD

## 2018-09-01 ENCOUNTER — Telehealth: Payer: Self-pay | Admitting: Neurology

## 2018-09-01 DIAGNOSIS — R42 Dizziness and giddiness: Secondary | ICD-10-CM

## 2018-09-01 DIAGNOSIS — R519 Headache, unspecified: Secondary | ICD-10-CM

## 2018-09-01 NOTE — Telephone Encounter (Signed)
The description of her pain is not in keeping with neuralgia.  Neuralgic pain often responds very well to gabapentin, which has not helped her very well. There are no changes in the brain MRI typically with neuralgia.  Again, we can consider a referral to a Comprehensive headache management clinic such as at [facility] or [place] or [place], they are encouraged to think about it.

## 2018-09-01 NOTE — Telephone Encounter (Signed)
Pt daughter is wanting to know if pt's head pain could be from neurologia.  Daughter wants to know if neurologia was detected on the MRI

## 2018-09-01 NOTE — Telephone Encounter (Signed)
I called pt's daughter, Mardene Celeste, per DPR, and discussed this with her. They will consider the headache management clinic. [name] verbalized understanding of recommendations.

## 2018-09-03 ENCOUNTER — Telehealth: Payer: Self-pay | Admitting: Interventional Cardiology

## 2018-09-03 DIAGNOSIS — I4891 Unspecified atrial fibrillation: Secondary | ICD-10-CM

## 2018-09-03 DIAGNOSIS — N184 Chronic kidney disease, stage 4 (severe): Secondary | ICD-10-CM

## 2018-09-03 DIAGNOSIS — Z79899 Other long term (current) drug therapy: Secondary | ICD-10-CM

## 2018-09-03 DIAGNOSIS — Z7901 Long term (current) use of anticoagulants: Secondary | ICD-10-CM

## 2018-09-03 DIAGNOSIS — Z5181 Encounter for therapeutic drug level monitoring: Secondary | ICD-10-CM

## 2018-09-03 NOTE — Telephone Encounter (Signed)
Needs TSH, CBC, and CMET

## 2018-09-03 NOTE — Telephone Encounter (Signed)
New Message   Patient's daughter called in due to patient having an appointment on Monday 09/07/18 with PCP Dr. Moreen Fowler. Patient will be having blood work done during this visit and patient's daughter states that Dr. [name] uses the results as well from the blood work from the PCP. Patient's daughter would like to know what test, if any, does Dr. [name] need with the patient  Being on Eliquis. Patient's daughter is trying to figure this out, so that all the blood work can be done during the appointment on Monday 09/07/18. Please call back to advise.

## 2018-09-04 NOTE — Telephone Encounter (Signed)
° ° °  Dr Laqueta Linden office returned call , will order labs on 8/24 ov.

## 2018-09-04 NOTE — Telephone Encounter (Addendum)
Spoke with Mardene Celeste and advised her the labs Dr. [name] would like to have drawn while she is at Dr. Laqueta Linden office on Monday 09/07/18...   Spoke Lattie Haw at Dr. Laqueta Linden office [phone] and they can acces the orders in Epic... but she is going to check with his nurse and be sure that is all they need and call me back.

## 2018-09-06 ENCOUNTER — Emergency Department (HOSPITAL_COMMUNITY)
Admission: EM | Admit: 2018-09-06 | Discharge: 2018-09-06 | Disposition: A | Discharge status: Home / Self Care | Payer: Medicare Other | Attending: Emergency Medicine | Admitting: Emergency Medicine

## 2018-09-06 ENCOUNTER — Other Ambulatory Visit: Payer: Self-pay

## 2018-09-06 ENCOUNTER — Emergency Department (HOSPITAL_COMMUNITY): Payer: Medicare Other

## 2018-09-06 ENCOUNTER — Telehealth: Payer: Self-pay | Admitting: Cardiology

## 2018-09-06 DIAGNOSIS — E039 Hypothyroidism, unspecified: Secondary | ICD-10-CM | POA: Diagnosis not present

## 2018-09-06 DIAGNOSIS — I5032 Chronic diastolic (congestive) heart failure: Secondary | ICD-10-CM | POA: Diagnosis not present

## 2018-09-06 DIAGNOSIS — I959 Hypotension, unspecified: Secondary | ICD-10-CM | POA: Diagnosis not present

## 2018-09-06 DIAGNOSIS — I480 Paroxysmal atrial fibrillation: Secondary | ICD-10-CM

## 2018-09-06 DIAGNOSIS — I509 Heart failure, unspecified: Secondary | ICD-10-CM | POA: Diagnosis not present

## 2018-09-06 DIAGNOSIS — R072 Precordial pain: Secondary | ICD-10-CM | POA: Diagnosis not present

## 2018-09-06 DIAGNOSIS — R0789 Other chest pain: Secondary | ICD-10-CM | POA: Diagnosis not present

## 2018-09-06 DIAGNOSIS — Z96653 Presence of artificial knee joint, bilateral: Secondary | ICD-10-CM | POA: Diagnosis not present

## 2018-09-06 DIAGNOSIS — Z7901 Long term (current) use of anticoagulants: Secondary | ICD-10-CM | POA: Diagnosis not present

## 2018-09-06 DIAGNOSIS — R079 Chest pain, unspecified: Secondary | ICD-10-CM | POA: Diagnosis present

## 2018-09-06 DIAGNOSIS — I4891 Unspecified atrial fibrillation: Secondary | ICD-10-CM | POA: Diagnosis not present

## 2018-09-06 LAB — BASIC METABOLIC PANEL
Anion gap: 13 (ref 5–15)
BUN: 20 mg/dL (ref 8–23)
CO2: 24 mmol/L (ref 22–32)
Calcium: 9.2 mg/dL (ref 8.9–10.3)
Chloride: 96 mmol/L — ABNORMAL LOW (ref 98–111)
Creatinine, Ser: 1.2 mg/dL — ABNORMAL HIGH (ref 0.44–1.00)
GFR calc Af Amer: 44 mL/min — ABNORMAL LOW (ref >60)
GFR calc non Af Amer: 38 mL/min — ABNORMAL LOW (ref >60)
Glucose, Bld: 113 mg/dL — ABNORMAL HIGH (ref 70–99)
Potassium: 4.1 mmol/L (ref 3.5–5.1)
Sodium: 133 mmol/L — ABNORMAL LOW (ref 135–145)

## 2018-09-06 LAB — CBC
HCT: 42.7 % (ref 36.0–46.0)
Hemoglobin: 14 g/dL (ref 12.0–15.0)
MCH: 33.4 pg (ref 26.0–34.0)
MCHC: 32.8 g/dL (ref 30.0–36.0)
MCV: 101.9 fL — ABNORMAL HIGH (ref 80.0–100.0)
Platelets: 235 10*3/uL (ref 150–400)
RBC: 4.19 MIL/uL (ref 3.87–5.11)
RDW: 13.2 % (ref 11.5–15.5)
WBC: 10.6 10*3/uL — ABNORMAL HIGH (ref 4.0–10.5)
nRBC: 0 % (ref 0.0–0.2)

## 2018-09-06 LAB — HEPATIC FUNCTION PANEL
ALT: 17 U/L (ref 0–44)
AST: 23 U/L (ref 15–41)
Albumin: 3.7 g/dL (ref 3.5–5.0)
Alkaline Phosphatase: 47 U/L (ref 38–126)
Bilirubin, Direct: 0.3 mg/dL — ABNORMAL HIGH (ref 0.0–0.2)
Indirect Bilirubin: 0.9 mg/dL (ref 0.3–0.9)
Total Bilirubin: 1.2 mg/dL (ref 0.3–1.2)
Total Protein: 5.8 g/dL — ABNORMAL LOW (ref 6.5–8.1)

## 2018-09-06 LAB — TROPONIN I (HIGH SENSITIVITY)
Troponin I (High Sensitivity): 51 ng/L — ABNORMAL HIGH (ref <18)
Troponin I (High Sensitivity): 49 ng/L — ABNORMAL HIGH (ref <18)

## 2018-09-06 LAB — CBG MONITORING, ED: Glucose-Capillary: 108 mg/dL — ABNORMAL HIGH (ref 70–99)

## 2018-09-06 LAB — LIPASE, BLOOD: Lipase: 22 U/L (ref 11–51)

## 2018-09-06 MED ORDER — APIXABAN 2.5 MG PO TABS
2.5000 mg | ORAL_TABLET | Freq: Once | ORAL | Status: DC
  Filled 2018-09-06: qty 1

## 2018-09-06 MED ORDER — AMIODARONE HCL 200 MG PO TABS
100.0000 mg | ORAL_TABLET | Freq: Every day | ORAL | Status: DC
  Administered 2018-09-06: 100 mg via ORAL
  Filled 2018-09-06: qty 1

## 2018-09-06 MED ORDER — SODIUM CHLORIDE 0.9% FLUSH: 3.0000 mL | Freq: Once | INTRAVENOUS | Status: DC

## 2018-09-06 NOTE — ED Triage Notes (Signed)
Pt presents from home via POV. This am awoke telling family that she felt "strange" in her chest and abdomen, weakness such that she wanted to stay in bed this AM, low blood pressures at home (SBP80s, DBP 50s, for 1 week), though BP is 148/70 in triage. Otherwise, all other sx began this AM.  No pain or shob reported in triage room.  Reports recently seeing 5 specialists for chronic throbbing HA that is present today.  Take eliquis, amiodarone, synthroid, gabapentin

## 2018-09-06 NOTE — ED Provider Notes (Signed)
[name] Provider Note   CSN: [id number] Arrival date & time: 09/06/18  1602     History   Chief Complaint Chief Complaint  Patient presents with   Weakness   Chest Pain    HPI [name] is a 85 y.o. female.     HPI Patient had an unusual episode of pain earlier in the morning.  She reports that she felt the discomfort that she perceived from her abdomen up to her chest.  She reports that it hurts some up towards her left shoulder.  She thought it might be heartburn.  This actually spontaneously resolved earlier in the day.  She did get seen at [facility] walk-in clinic.  By that time her pain had resolved but there was concern for possible EKG changes in her presentation thus patient was referred to the emergency department.  Patient's daughter reports that they have been monitoring her blood pressure throughout the weekend there have been a number of episodes where it has been low.  She has pressures documented in the 80s over 50s periodically.  Heart rates have typically been in the 50s or 60s.  Patient has a prior history of atrial fibrillation.  She does take a half a tablet of amiodarone once daily and Eliquis twice daily.  She has taken her morning Eliquis and amiodarone dose.  Patient denies that she has been sick.  She has not had fever chills or productive cough.  She denies she is having problems with pain in the calves or behind the knees. Past Medical History:  Diagnosis Date   Blindness    Blood clot associated with vein wall inflammation    CKD (chronic kidney disease), stage III (HCC)    Colitis    Goiter    s/p radioactive iod. tx   Hypothyroidism    On amiodarone therapy    Paroxysmal atrial fibrillation (HCC)    Pulmonary embolus (HCC)    and DVT s/p IVC filter   SBO (small bowel obstruction) (HCC)    Spinal stenosis of lumbar region with radiculopathy    possible neurogenic claudication recently?    Temporal arteritis [facility])     Patient Active Problem List   Diagnosis Date Noted   Anticoagulated on warfarin 11/23/2016   Encounter for monitoring amiodarone therapy 07/07/2016   Mild anemia 11/28/2015   Chronic diastolic heart failure ([place]) 12/29/2013   SBO (small bowel obstruction) ([place]) 01/17/2013   Microscopic colitis 01/17/2013   Leukocytosis 01/17/2013   Aspiration pneumonia ([place]) 01/17/2013   CKD (chronic kidney disease), stage IV ([place])    SYNCOPE 09/21/2008   Disorder of thyroid 09/17/2008   Atrial fibrillation ([place]) 09/17/2008   TEMPORAL ARTERITIS 09/17/2008   Arthropathy 09/17/2008   DYSPNEA ON EXERTION 09/17/2008    Past Surgical History:  Procedure Laterality Date   APPENDECTOMY  1940   BACK SURGERY  04/1998   spinal stenosis   BACK SURGERY  2000   Ruptured Disc   BREAST BIOPSY     cysts in both breasts   CARPAL TUNNEL RELEASE  2002   ivc filter     PARTIAL HYSTERECTOMY     TONSILLECTOMY  1934   TOTAL KNEE ARTHROPLASTY Left 04/1999   TOTAL KNEE ARTHROPLASTY Right 02/2000     OB History   No obstetric history on file.      Home Medications    Prior to Admission medications   Medication Sig Start Date End Date Taking? Authorizing Provider  amiodarone (PACERONE) 200 MG tablet TAKE (1/2) TABLET DAILY. 12/16/17   Belva Crome, MD  apixaban (ELIQUIS) 2.5 MG TABS tablet Take 1 tablet (2.5 mg total) by mouth 2 (two) times daily. 04/16/18   Belva Crome, MD  Calcium Carbonate-Vitamin D (CALCIUM HIGH POTENCY/VITAMIN D) 600-200 MG-UNIT TABS Take by mouth.    [provider]  CRANBERRY EXTRACT PO Take by mouth daily. Take 2 tablets twice a day.    [provider]  gabapentin (NEURONTIN) 100 MG capsule Take 200 mg by mouth 3 (three) times daily.     [provider]  levothyroxine (SYNTHROID, LEVOTHROID) 150 MCG tablet Take 150 mcg by mouth daily before breakfast.    [provider]  lubiprostone (AMITIZA)  8 MCG capsule Take 8 mcg by mouth 3 (three) times daily with meals.     [provider]  Misc Natural Products (GLUCOSAMINE CHONDROITIN COMPLX) TABS Take 1 tablet by mouth 2 (two) times daily.     [provider]  Misc Natural Products (OSTEO BI-FLEX TRIPLE STRENGTH PO) Osteo Bi-Flex Triple Strength    [provider]  Multiple Vitamins-Minerals (OCUVITE ADULT 50+ PO) Take 1 tablet by mouth daily.    [provider]  nitrofurantoin (MACRODANTIN) 50 MG capsule Take 50 mg by mouth daily.    [provider]  NONFORMULARY OR COMPOUNDED [place]  Anti-Inflammatory Cream- Diclofenac 3%, Baclofen 2%, Lidocaine 2% Apply 1-2 grams to affected area 3-4 times daily Qty. 120 gm 3 refills 01/22/17   Gardiner Barefoot, DPM  predniSONE (DELTASONE) 5 MG tablet prednisone 5 mg tablet    [provider]  Probiotic Product ([place]) Take by mouth.    [provider]  traMADol (ULTRAM) 50 MG tablet tramadol 50 mg tablet  Take 1 tablet twice a day by oral route as needed.    [provider]    Family History Family History  Problem Relation Age of Onset   Breast cancer Sister    Congestive Heart Failure Mother    Stroke Father    Crohn's disease Neg Hx    Ulcerative colitis Neg Hx     Social History Social History   Tobacco Use   Smoking status: Never Smoker   Smokeless tobacco: Never Used  Substance Use Topics   Alcohol use: No   Drug use: No     Allergies   Hydrocortisone, Sulfa antibiotics, Cefdinir, Cortisone, Prednisone, Sulfasalazine, and Sulfonamide derivatives   Review of Systems Review of Systems 10 Systems reviewed and are negative for acute change except as noted in the HPI.   Physical Exam Updated Vital Signs BP (!) 170/68    Pulse 63    Temp 97.7 F (36.5 C) (Oral)    Resp 20    Ht 5\' 5"  (1.651 m)    Wt 56.7 kg    SpO2 100%    BMI 20.80 kg/m   Physical  Exam Constitutional:      Comments: Patient is alert and clinically well in appearance.  Excellent condition for age.  No respiratory distress.  Mental status clear.  HENT:     Head: Normocephalic and atraumatic.  Eyes:     Extraocular Movements: Extraocular movements intact.  Cardiovascular:     Rate and Rhythm: Normal rate and regular rhythm.  Pulmonary:     Effort: Pulmonary effort is normal.     Breath sounds: Normal breath sounds.  Abdominal:     General: There is no  distension.     Palpations: Abdomen is soft.     Tenderness: There is no abdominal tenderness. There is no guarding.  Musculoskeletal: Normal range of motion.        General: No swelling or tenderness.     Right lower leg: No edema.     Left lower leg: No edema.  Skin:    General: Skin is warm and dry.  Neurological:     General: No focal deficit present.     Mental Status: She is oriented to person, place, and time.     Coordination: Coordination normal.  Psychiatric:        Mood and Affect: Mood normal.      ED Treatments / Results  Labs (all labs ordered are listed, but only abnormal results are displayed) Labs Reviewed  BASIC METABOLIC PANEL - Abnormal; Notable for the following components:      Result Value   Sodium 133 (*)    Chloride 96 (*)    Glucose, Bld 113 (*)    Creatinine, Ser 1.20 (*)    GFR calc non Af Amer 38 (*)    GFR calc Af Amer 44 (*)    All other components within normal limits  CBC - Abnormal; Notable for the following components:   WBC 10.6 (*)    MCV 101.9 (*)    All other components within normal limits  HEPATIC FUNCTION PANEL - Abnormal; Notable for the following components:   Total Protein 5.8 (*)    Bilirubin, Direct 0.3 (*)    All other components within normal limits  CBG MONITORING, ED - Abnormal; Notable for the following components:   Glucose-Capillary 108 (*)    All other components within normal limits  TROPONIN I (HIGH SENSITIVITY) - Abnormal; Notable for  the following components:   Troponin I (High Sensitivity) 51 (*)    All other components within normal limits  TROPONIN I (HIGH SENSITIVITY) - Abnormal; Notable for the following components:   Troponin I (High Sensitivity) 49 (*)    All other components within normal limits  LIPASE, BLOOD    EKG EKG Interpretation  Date/Time:  Sunday September 06 2018 16:13:02 EDT Ventricular Rate:  59 PR Interval:  204 QRS Duration: 102 QT Interval:  462 QTC Calculation: 457 R Axis:   74 Text Interpretation:  Sinus bradycardia Incomplete right bundle branch block Left ventricular hypertrophy with repolarization abnormality Anteroseptal infarct , age undetermined Abnormal ECG lateral T wave inversion greater in V5 no STEMI Confirmed by Charlesetta Shanks [phone]) on 09/06/2018 5:54:29 PM   Radiology Dg Chest 2 View  Result Date: 09/06/2018 CLINICAL DATA:  Hypotension, weakness EXAM: CHEST - 2 VIEW COMPARISON:  12/30/2016 FINDINGS: Lungs are clear.  No pleural effusion or pneumothorax. The heart is normal in size.  Thoracic aortic atherosclerosis. IMPRESSION: No evidence of acute cardiopulmonary disease. Thoracic aortic atherosclerosis. Electronically Signed   By: Julian Hy M.D.   On: 09/06/2018 17:45    Procedures Procedures (including critical care time)  Medications Ordered in ED Medications  sodium chloride flush (NS) 0.9 % injection 3 mL (has no administration in time range)  apixaban (ELIQUIS) tablet 2.5 mg (has no administration in time range)  amiodarone (PACERONE) tablet 100 mg (100 mg Oral Given 09/06/18 2110)     Initial Impression / Assessment and Plan / ED Course  I have reviewed the triage vital signs and the nursing notes.  Pertinent labs & imaging results that were available during my care of  the patient were reviewed by me and considered in my medical decision making (see chart for details).       Patient's pain had resolved prior to arrival.  Patient's daughter pointed  out a number of readings of low blood pressures at home.  She has not had any low blood pressures documented today.  She has been either normotensive or periodically slightly hypertensive.  Patient had EKG that was sinus rhythm with grossly similar appearance to old with slight increased appearance of lateral ST depression in V5.  Troponins were stable.  Patient did not elevate with cycling troponins.  Patient was very adamant about going home.  We did discuss the fact that this episode earlier today may very well have been an anginal episode.  At this time, with pain resolved and no elevating in the troponins patient does wish to go home and contact her cardiologist for outpatient follow-up.  At this time she is not exhibiting dyspnea and has no further pain.  After going in the room talking with the patient near completing the encounter, her heart rate elevated and she converted to A. fib rates ranging from 90s up to 130s.  Patient was asymptomatic with this.  She did not perceive palpitation or chest pain.  We discussed the possibility of staying in the hospital for treatment which she adamantly did not want to do.  We agreed at this point patient will take an extra dose of her amiodarone this evening and take Eliquis as prescribed.  She is with her daughters who are constantly assisting in supervising.  They will call the cardiologist in the morning.  If she declines in any way or heart rate does not improve what she is home, they will return for reassessment.  Final Clinical Impressions(s) / ED Diagnoses   Final diagnoses:  Precordial pain  Paroxysmal atrial fibrillation [facility])    ED Discharge Orders    None       Charlesetta Shanks, MD 09/06/18 2129

## 2018-09-06 NOTE — ED Notes (Signed)
Pt transported to XRay 

## 2018-09-06 NOTE — Discharge Instructions (Addendum)
1.  Call your cardiologist office in the morning to try to schedule a recheck tomorrow or the next day.  Take your amiodarone tomorrow morning as prescribed as well as your Eliquis.  You may continue your other regular medications. 2.  Continue to monitor your blood pressure and heart rate at home.  Keep a journal. 3.  Return to the emergency department if you develop recurrence or persistence of chest pain, shortness of breath or other concerning symptoms.

## 2018-09-06 NOTE — Telephone Encounter (Signed)
   Received outpatient page from [facility] regarding patient. Attempted to call back with no response or no way to leave message.   Kathyrn Drown NP-C [place] Pager: [phone]

## 2018-09-06 NOTE — ED Notes (Addendum)
Void  

## 2018-09-07 ENCOUNTER — Encounter: Payer: Self-pay | Admitting: Cardiology

## 2018-09-07 ENCOUNTER — Telehealth: Payer: Self-pay | Admitting: Interventional Cardiology

## 2018-09-07 DIAGNOSIS — K5900 Constipation, unspecified: Secondary | ICD-10-CM | POA: Diagnosis not present

## 2018-09-07 DIAGNOSIS — E039 Hypothyroidism, unspecified: Secondary | ICD-10-CM | POA: Diagnosis not present

## 2018-09-07 DIAGNOSIS — E782 Mixed hyperlipidemia: Secondary | ICD-10-CM | POA: Diagnosis not present

## 2018-09-07 DIAGNOSIS — K529 Noninfective gastroenteritis and colitis, unspecified: Secondary | ICD-10-CM | POA: Diagnosis not present

## 2018-09-07 DIAGNOSIS — I509 Heart failure, unspecified: Secondary | ICD-10-CM | POA: Diagnosis not present

## 2018-09-07 DIAGNOSIS — I959 Hypotension, unspecified: Secondary | ICD-10-CM | POA: Diagnosis not present

## 2018-09-07 DIAGNOSIS — I4891 Unspecified atrial fibrillation: Secondary | ICD-10-CM | POA: Diagnosis not present

## 2018-09-07 DIAGNOSIS — M5432 Sciatica, left side: Secondary | ICD-10-CM | POA: Diagnosis not present

## 2018-09-07 DIAGNOSIS — R51 Headache: Secondary | ICD-10-CM | POA: Diagnosis not present

## 2018-09-07 DIAGNOSIS — N183 Chronic kidney disease, stage 3 (moderate): Secondary | ICD-10-CM | POA: Diagnosis not present

## 2018-09-07 DIAGNOSIS — M069 Rheumatoid arthritis, unspecified: Secondary | ICD-10-CM | POA: Diagnosis not present

## 2018-09-07 DIAGNOSIS — R42 Dizziness and giddiness: Secondary | ICD-10-CM | POA: Diagnosis not present

## 2018-09-07 MED ORDER — NITROGLYCERIN 0.4 MG SL SUBL: 0.4000 mg | SUBLINGUAL_TABLET | SUBLINGUAL | 3 refills | Status: AC | PRN

## 2018-09-07 NOTE — Telephone Encounter (Signed)
Pts daughter is calling in and she is agreeable for the referral to [facility] for her mother ( the pt). She is wanting to request referral to be sent to :  Dr Gypsy Decant  [phone]  [facility]

## 2018-09-07 NOTE — Addendum Note (Signed)
Addended by: Lester Warwick A on: 09/07/2018 09:29 AM   Modules accepted: Orders

## 2018-09-07 NOTE — Telephone Encounter (Signed)
Referral placed.

## 2018-09-07 NOTE — Telephone Encounter (Signed)
Thank you. Nothing further needed. Hope, she gets some more insight into her symptoms soon and relief.

## 2018-09-07 NOTE — Telephone Encounter (Signed)
Pt woke up yesterday morning with a "funny feeling" in her neck and chest.  She stayed in bed for a little while with no relief.  Daughter helped her get up and BP was low 80s/low 40s.  Daughter states low BPs have been an issue off and on recently and then sometimes it jumps real high.  Went to walk in clinic at [place] and EKG was suggestive of ischemia so they told her to go to [facility].  While at the hospital she converted to Afib.  Daughter states they gave her an extra dose of Amio and told her to f/u with our office ASAP.  Last night at 1015P, HR was 106.  This morning BP was 124/90 and HR 60.  Pt told daughter she still has that "funny feeling" in her neck and chest which are sx she has had in the past when she was in Afib.  Also has a pulsing sensation in her head today.  Pt currently scheduled to see Kathyrn Drown, NP on Thursday this week.  Advised I will send to Dr. [name] for review.

## 2018-09-07 NOTE — Telephone Encounter (Signed)
Spoke with daughter and went over recommendations.  Daughter agreeable.  Daughter did mention that pt seen PCP earlier today and her BP was 84/42.  States PCP was very concerned and told her to reach out to our office about possibly cutting back even more on the Amiodarone.  Pt currently takes 100mg  QD.  Daughter states the low BPs have been occurring off and on for about 2 weeks.  Only recent med change is she went from taking 400mg  a day of Gabapentin to 600mg .  Advised I will send message to Dr. [name] for review.

## 2018-09-07 NOTE — Telephone Encounter (Signed)
I reviewed everything from ER. ECG not grossly different. They did not capture AF in chart documents although described in note. If she feels okay, current plan sound great. Prescribe NTG .4 mg sl prn if recurrent pain. Needs to be seated or lying if uses.  F/U with Sharee Pimple as planned.

## 2018-09-07 NOTE — Telephone Encounter (Signed)
  Daughter is calling because patient went to the walk in clinic on 09/07/18 and was told the patient is in afib and to make an appt to see Dr [name] soon as possible. I could not find anything available before November with Dr [name] and the soonest with PA/NP was 09/23/18. Daughter wants to see if patient can be worked in sooner. Please advise.

## 2018-09-07 NOTE — Progress Notes (Signed)
Cardiology Office Note   Date:  09/10/2018   ID:  Brittany, Hamilton [birth year], MRN [id number]  PCP:  Antony Contras, MD  Cardiologist: Dr. Daneen Schick, MD  Chief Complaint  Patient presents with  . Follow-up    History of Present Illness: [name] is a 85 y.o. female with a history of paroxysmal atrial fibrillation, chronic amiodarone therapy, history of PE on chronic anticoagulation with Eliquis 2.5 mg twice daily and CKD stage II.  She was last seen by Dr. Tamala Hamilton on 01/27/2018.  At that time she was noted to have recently had a fall right before Christmas 2019 in which she endured a right rib fracture and rotator cuff injury.  There was no loss of consciousness.  She was doing well until she was apparently seen in the emergency department and discharged on 09/06/2018.  She had an unusual episode of pain earlier in the day prior to presentation which was described as a discomfort from her abdomen up to her chest with radiation to her left shoulder.  She thought this to be heartburn that spontaneously resolved.  She presented to [facility] walk-in clinic however her pain was resolved at that time.  There was concern for possible EKG changes in her presentation and therefore was referred to the ED for further evaluation.  Dr. Tamala Hamilton reviewed EKG on file and reports no significant changes from prior tracings.  While in the ED, she was mildly hypertensive at 170/68.  Weight was noted to be 56.7 kg.  Troponins were negative without concern for ACS.  Per chart review, she had an episode of atrial fibrillation with rates in the 90s up to 130s. ED physician discussed admitting for observation however patient declined.  She was discharged with no further changes.  Dr. Tamala Hamilton did electronically prescribe her nitroglycerin for further episodes of possible anginal pain.   Today, she presents with her daughter.  Patient reports no further episodes since being seen in the emergency department.  Her  initial was described as having chest heaviness and "just did not feel well ". She had mild nausea but no vomiting or diaphoresis.  Denies recent illness with fever, chills or recent cough.  Daughter is a recent increase in her gabapentin per PCP.  At this time, daughter states that her BP is running mildly low which is unusual for her.  This week however they have been more stable with SBP in the 110-120s. She has not needed to take SLN TG. We discussed proper nitroglycerin administration and cautioned against her recent low BPs. Long discussion regarding further ischemic work-up with her daughter and patient.  They do not wish for aggressive intervention given advanced age.  They are interested in further work-up with an echocardiogram.  Last echocardiogram was from 2018 with normal LV function and no regional wall motion abnormalities.  Past Medical History:  Diagnosis Date  . Blindness   . Blood clot associated with vein wall inflammation   . CKD (chronic kidney disease), stage III ([place])   . Colitis   . Goiter    s/p radioactive iod. tx  . Hypothyroidism   . On amiodarone therapy   . Paroxysmal atrial fibrillation (HCC)   . Pulmonary embolus (HCC)    and DVT s/p IVC filter  . SBO (small bowel obstruction) ([place])   . Spinal stenosis of lumbar region with radiculopathy    possible neurogenic claudication recently?  . Temporal arteritis (HCC)     Past  Surgical History:  Procedure Laterality Date  . APPENDECTOMY  1940  . BACK SURGERY  04/1998   spinal stenosis  . BACK SURGERY  2000   Ruptured Disc  . BREAST BIOPSY     cysts in both breasts  . CARPAL TUNNEL RELEASE  2002  . ivc filter    . PARTIAL HYSTERECTOMY    . TONSILLECTOMY  1934  . TOTAL KNEE ARTHROPLASTY Left 04/1999  . TOTAL KNEE ARTHROPLASTY Right 02/2000     Current Outpatient Medications  Medication Sig Dispense Refill  . amiodarone (PACERONE) 200 MG tablet TAKE (1/2) TABLET DAILY. 45 tablet 2  . apixaban (ELIQUIS) 2.5  MG TABS tablet Take 1 tablet (2.5 mg total) by mouth 2 (two) times daily. 60 tablet 11  . Calcium Carbonate-Vitamin D (CALCIUM HIGH POTENCY/VITAMIN D) 600-200 MG-UNIT TABS Take by mouth.    Drusilla Kanner EXTRACT PO Take by mouth daily. Take 2 tablets twice a day.    . gabapentin (NEURONTIN) 100 MG capsule Take 200 mg by mouth 3 (three) times daily.     Marland Kitchen levothyroxine (SYNTHROID, LEVOTHROID) 150 MCG tablet Take 150 mcg by mouth daily before breakfast.    . lubiprostone (AMITIZA) 8 MCG capsule Take 8 mcg by mouth 3 (three) times daily with meals.     . Misc Natural Products (GLUCOSAMINE CHONDROITIN COMPLX) TABS Take 1 tablet by mouth 2 (two) times daily.     . Misc Natural Products (OSTEO BI-FLEX TRIPLE STRENGTH PO) Osteo Bi-Flex Triple Strength    . Multiple Vitamins-Minerals (OCUVITE ADULT 50+ PO) Take 1 tablet by mouth daily.    . nitrofurantoin (MACRODANTIN) 50 MG capsule Take 50 mg by mouth daily.    . nitroGLYCERIN (NITROSTAT) 0.4 MG SL tablet Place 1 tablet (0.4 mg total) under the tongue every 5 (five) minutes as needed for chest pain. 25 tablet 3  . NONFORMULARY OR COMPOUNDED [place]  Anti-Inflammatory Cream- Diclofenac 3%, Baclofen 2%, Lidocaine 2% Apply 1-2 grams to affected area 3-4 times daily Qty. 120 gm 3 refills 1 each 3  . predniSONE (DELTASONE) 5 MG tablet prednisone 5 mg tablet    . Probiotic Product ([place]) Take by mouth.    . traMADol (ULTRAM) 50 MG tablet tramadol 50 mg tablet  Take 1 tablet twice a day by oral route as needed.     No current facility-administered medications for this visit.     Allergies:   Hydrocortisone, Sulfa antibiotics, Cefdinir, Cortisone, Prednisone, Sulfasalazine, and Sulfonamide derivatives    Social History:  The patient  reports that she has never smoked. She has never used smokeless tobacco. She reports that she does not drink alcohol or use drugs.   Family History:  The patient's family history  includes Breast cancer in her sister; Congestive Heart Failure in her mother; Stroke in her father.    ROS:  Please see the history of present illness. Otherwise, review of systems are positive for none.   All other systems are reviewed and negative.    PHYSICAL EXAM: VS:  BP (!) 122/54   Pulse 62   Ht 5\' 5"  (1.651 m)   Wt 111 lb (50.3 kg)   SpO2 98%   BMI 18.47 kg/m  , BMI Body mass index is 18.47 kg/m.   General: Elderly, NAD Neck: Negative for carotid bruits. No JVD Lungs:Clear to ausculation bilaterally. No wheezes, rales, or rhonchi. Breathing is unlabored. Cardiovascular: RRR with S1 S2. No murmurs MSK: Strength and tone appear  normal for age. 5/5 in all extremities Extremities: No edema. No clubbing or cyanosis. DP/PT pulses 2+ bilaterally Neuro: Alert and oriented. No focal deficits. No facial asymmetry. MAE spontaneously. Psych: Responds to questions appropriately with normal affect.     EKG:  EKG is not ordered today.   Recent Labs: 09/06/2018: ALT 17; BUN 20; Creatinine, Ser 1.20; Hemoglobin 14.0; Platelets 235; Potassium 4.1; Sodium 133    Lipid Panel No results found for: CHOL, TRIG, HDL, CHOLHDL, VLDL, LDLCALC, LDLDIRECT    Wt Readings from Last 3 Encounters:  09/10/18 111 lb (50.3 kg)  09/06/18 125 lb (56.7 kg)  01/27/18 126 lb 9.6 oz (57.4 kg)     Other studies Reviewed: Additional studies/ records that were reviewed today include:    Echocardiogram 11/24/2016:  - Left ventricle: The cavity size was normal. Wall thickness was   increased in a pattern of mild LVH. Systolic function was normal.   The estimated ejection fraction was in the range of 60% to 65%.   Wall motion was normal; there were no regional wall motion   abnormalities. Features are consistent with a pseudonormal left   ventricular filling pattern, with concomitant abnormal relaxation   and increased filling pressure (grade 2 diastolic dysfunction).   Doppler parameters are  consistent with high ventricular filling   pressure. - Aortic valve: Valve area (VTI): 2.39 cm^2. Valve area (Vmax):   2.23 cm^2. Valve area (Vmean): 1.95 cm^2. - Mitral valve: Mildly calcified annulus. Mildly thickened leaflets   . There was mild regurgitation. - Left atrium: The atrium was moderately dilated. - Right atrium: The atrium was mildly dilated. - Atrial septum: There was increased thickness of the septum,   consistent with lipomatous hypertrophy. There was a patent   foramen ovale. PFO with small left to right shunt.   ASSESSMENT AND PLAN:  1. Possible angina: -As above, presented with possible anginal symptoms to [facility] outpatient clinic and referred to ED for further evaluation -Dr. Tamala Hamilton reviewed EKG from that time with no concern for acute changes -Presented to the emergency department for further evaluation with HsT at 51> 49 was low concern for ACS -SL NTG added to medication regimen>> not needed -Demonstrates understanding of how to self administer nitroglycerin -She has never undergone ischemic work-up.  Discussed in depth various modalities and patient is currently not interested in pursuing invasive testing however is interested in repeat echocardiogram to evaluate for wall motion maladies -We will continue with medical management  -If chronic angina becomes a issue, could consider antianginals however would need to be cautious given low BPs  2.  Atrial fibrillation: -Rate controlled, continue low-dose amiodarone 100 mg daily -Continue anticoagulation with reduced dose Eliquis 2.5 mg twice daily given age, body weight and creatinine -No signs of acute bleeding in stool or urine  3.  CKD stage IV: -Creatinine, 1.2 during ED visit -Baseline appears to be in the 1.3-1.4 range    4.  Chronic antiarrhythmic, amiodarone: -No obvious toxicity -Recent LFTs were unremarkable -Needs TSH at next office visit -CXR from ED evaluation 09/06/2018 with no evidence of  acute cardiopulmonary disease   Current medicines are reviewed at length with the patient today.  The patient does not have concerns regarding medicines.  The following changes have been made:  no change  Labs/ tests ordered today include: echocardiogram    Orders Placed This Encounter  Procedures  . ECHOCARDIOGRAM COMPLETE    Disposition:   FU with Dr. Tamala Hamilton in 2 weeks as  scheduled   Signed, Kathyrn Drown, NP  09/10/2018 12:51 PM    [facility] [place], [place], [place]  [address] Phone: [phone]; Fax: [phone]

## 2018-09-07 NOTE — Telephone Encounter (Signed)
Left message to call back  

## 2018-09-08 NOTE — Telephone Encounter (Signed)
Spoke with patient's daughter and informed her oral amiodarone should not affect BP. [name] is concerned the increased dose of gabapentin may be causing BP issues. She does state the patient is acting normally and does not seem sedated. She wants help with inconsistent BP readings. One day it will be normal and the next day 80/40s. She states the patient felt great today. Her BP and HR were normal and she has no complaints.  Reiterated to her to stay hydrated.  She has an appointment with Kathyrn Drown this Thursday. She understands her questions will be discussed at that time unless Dr. [name] has recommendations prior to that visit.

## 2018-09-08 NOTE — Telephone Encounter (Signed)
I have talked to patient's daughter and relayed I have sent referral gave daughter telephone number as well . On the referral I asked if she could be seen seen as soon as possible.

## 2018-09-08 NOTE — Telephone Encounter (Signed)
Hydration and liberal salt.

## 2018-09-08 NOTE — Telephone Encounter (Signed)
Amiodarone given orally does not lower the BP.

## 2018-09-09 ENCOUNTER — Ambulatory Visit: Payer: Medicare Other | Admitting: Cardiology

## 2018-09-09 NOTE — Telephone Encounter (Signed)
Spoke with daughter and made her aware of recommendations.  Daughter appreciative for call.  

## 2018-09-10 ENCOUNTER — Ambulatory Visit (INDEPENDENT_AMBULATORY_CARE_PROVIDER_SITE_OTHER): Payer: Medicare Other | Admitting: Cardiology

## 2018-09-10 ENCOUNTER — Encounter: Payer: Self-pay | Admitting: Cardiology

## 2018-09-10 ENCOUNTER — Other Ambulatory Visit: Payer: Self-pay

## 2018-09-10 ENCOUNTER — Ambulatory Visit (HOSPITAL_COMMUNITY): Payer: Medicare Other | Attending: Cardiology

## 2018-09-10 VITALS — BP 122/54 | HR 62 | Ht 65.0 in | Wt 111.0 lb

## 2018-09-10 DIAGNOSIS — R0789 Other chest pain: Secondary | ICD-10-CM

## 2018-09-10 DIAGNOSIS — I4891 Unspecified atrial fibrillation: Secondary | ICD-10-CM | POA: Diagnosis not present

## 2018-09-10 DIAGNOSIS — I209 Angina pectoris, unspecified: Secondary | ICD-10-CM | POA: Diagnosis not present

## 2018-09-10 DIAGNOSIS — R079 Chest pain, unspecified: Secondary | ICD-10-CM

## 2018-09-10 DIAGNOSIS — N184 Chronic kidney disease, stage 4 (severe): Secondary | ICD-10-CM | POA: Diagnosis not present

## 2018-09-10 DIAGNOSIS — Z79899 Other long term (current) drug therapy: Secondary | ICD-10-CM | POA: Diagnosis not present

## 2018-09-10 LAB — ECHOCARDIOGRAM COMPLETE
Height: 65 in
Weight: 1776 oz

## 2018-09-10 NOTE — Patient Instructions (Addendum)
Medication Instructions:  Your physician recommends that you continue on your current medications as directed. Please refer to the Current Medication list given to you today.  If you need a refill on your cardiac medications before your next appointment, please call your pharmacy.   Lab work: NONE If you have labs (blood work) drawn today and your tests are completely normal, you will receive your results only by: Marland Kitchen MyChart Message (if you have MyChart) OR . A paper copy in the mail If you have any lab test that is abnormal or we need to change your treatment, we will call you to review the results.  Testing/Procedures: Your physician has requested that you have an echocardiogram. Echocardiography is a painless test that uses sound waves to create images of your heart. It provides your doctor with information about the size and shape of your heart and how well your heart's chambers and valves are working. This procedure takes approximately one hour. There are no restrictions for this procedure.  Follow-Up: At [facility], you and your health needs are our priority.  As part of our continuing mission to provide you with exceptional heart care, we have created designated Provider Care Teams.  These Care Teams include your primary Cardiologist (physician) and Advanced Practice Providers (APPs -  Physician Assistants and Nurse Practitioners) who all work together to provide you with the care you need, when you need it. You will need a follow up appointment in [place].  Any Other Special Instructions Will Be Listed Below (If Applicable).   Echocardiogram An echocardiogram is a procedure that uses painless sound waves (ultrasound) to produce an image of the heart. Images from an echocardiogram can provide important information about:  Signs of coronary artery disease (CAD).  Aneurysm detection. An aneurysm is a weak or damaged part of an artery wall that bulges out from the  normal force of blood pumping through the body.  Heart size and shape. Changes in the size or shape of the heart can be associated with certain conditions, including heart failure, aneurysm, and CAD.  Heart muscle function.  Heart valve function.  Signs of a past heart attack.  Fluid buildup around the heart.  Thickening of the heart muscle.  A tumor or infectious growth around the heart valves. Tell a health care provider about:  Any allergies you have.  All medicines you are taking, including vitamins, herbs, eye drops, creams, and over-the-counter medicines.  Any blood disorders you have.  Any surgeries you have had.  Any medical conditions you have.  Whether you are pregnant or may be pregnant. What are the risks? Generally, this is a safe procedure. However, problems may occur, including:  Allergic reaction to dye (contrast) that may be used during the procedure. What happens before the procedure? No specific preparation is needed. You may eat and drink normally. What happens during the procedure?   An IV tube may be inserted into one of your veins.  You may receive contrast through this tube. A contrast is an injection that improves the quality of the pictures from your heart.  A gel will be applied to your chest.  A wand-like tool (transducer) will be moved over your chest. The gel will help to transmit the sound waves from the transducer.  The sound waves will harmlessly bounce off of your heart to allow the heart images to be captured in real-time motion. The images will be recorded on a computer. The procedure may  vary among health care providers and hospitals. What happens after the procedure?  You may return to your normal, everyday life, including diet, activities, and medicines, unless your health care provider tells you not to do that. Summary  An echocardiogram is a procedure that uses painless sound waves (ultrasound) to produce an image of the  heart.  Images from an echocardiogram can provide important information about the size and shape of your heart, heart muscle function, heart valve function, and fluid buildup around your heart.  You do not need to do anything to prepare before this procedure. You may eat and drink normally.  After the echocardiogram is completed, you may return to your normal, everyday life, unless your health care provider tells you not to do that. This information is not intended to replace advice given to you by your health care provider. Make sure you discuss any questions you have with your health care provider. Document Released: 12/29/1999 Document Revised: 04/23/2018 Document Reviewed: 02/03/2016 Elsevier Patient Education  2020 [employer].

## 2018-09-11 ENCOUNTER — Telehealth: Payer: Self-pay | Admitting: Cardiology

## 2018-09-11 MED ORDER — AMIODARONE HCL 200 MG PO TABS: 200.0000 mg | ORAL_TABLET | Freq: Every day | ORAL | 3 refills | Status: DC

## 2018-09-11 NOTE — Telephone Encounter (Signed)
Daughter of the patient called. The patient woke up this morning around 1:30 to use the restroom and had an afib episode. The daughter gave the patient an extra dose of amiodarone and after about an hour the pulse started to regulate. The daughter reports that during the episode her  pulse was racing and the patient had an irregular hr. The patient also had an headache centralized to the top of her head.  The daughter reports that the patient feels weak and tired now, but otherwise feels better. Her pulse is in a regular rhythm.   The daughter would like some guidance as to what to do if this were to happen again. If this happens with or without chest pain, should the daughter give the patient nitroglycerine?

## 2018-09-11 NOTE — Telephone Encounter (Signed)
Pts daughter, Mardene Celeste, called to report that the pt had an episode about 1:30am when she woke up with a headache and her heart was beating fast and irregular.. she could not get an accurate pulse.. she denies dizziness, sob, but was anxious... no chest pain.. she gave her an extra dose of Amiodarone 100 mg and after an hour she had relief. Today she is feeling better but tired and washed out... her BP now is 150/70 and HR 62 and regular.. she had a similar episode this past Sunday..   She is concerned because this is her second time and she had been feeling so well over the past several years without this happening.   She saw Kathyrn Drown NP yesterday and had an ECHO..she is asking if this was to happen again, what should her intervention be and is it okay to take the added Amiodarone prn.   Will forward to Dr. [name] since Kathyrn Drown NP is in the hosp today.  Pt seeing Dr. [name] 10/13/18.   She will rest and be sure to eat and drink well today and will call back if anything changes prior to [place] calling her. No caffeine.

## 2018-09-11 NOTE — Telephone Encounter (Signed)
Spoke with Dr. [name] and he said to increase Amiodarone to 200mg  QD.  Spoke with daughter and made her aware of recommendations.  Advised if episodes continue or she needs [place] before her appt at the end of Sept, give [place] a call.  Daughter appreciative for call.

## 2018-09-12 ENCOUNTER — Other Ambulatory Visit: Payer: Self-pay

## 2018-09-12 ENCOUNTER — Telehealth: Payer: Self-pay | Admitting: Medical

## 2018-09-12 ENCOUNTER — Encounter (HOSPITAL_COMMUNITY): Payer: Self-pay

## 2018-09-12 ENCOUNTER — Emergency Department (HOSPITAL_COMMUNITY): Payer: Medicare Other

## 2018-09-12 ENCOUNTER — Observation Stay (HOSPITAL_COMMUNITY)
Admission: EM | Admit: 2018-09-12 | Discharge: 2018-09-13 | Disposition: A | Discharge status: Home / Self Care | Payer: Medicare Other | Attending: Cardiovascular Disease | Admitting: Cardiovascular Disease

## 2018-09-12 DIAGNOSIS — Z20828 Contact with and (suspected) exposure to other viral communicable diseases: Secondary | ICD-10-CM | POA: Diagnosis not present

## 2018-09-12 DIAGNOSIS — Z7901 Long term (current) use of anticoagulants: Secondary | ICD-10-CM | POA: Diagnosis not present

## 2018-09-12 DIAGNOSIS — Z79899 Other long term (current) drug therapy: Secondary | ICD-10-CM | POA: Diagnosis not present

## 2018-09-12 DIAGNOSIS — I4891 Unspecified atrial fibrillation: Secondary | ICD-10-CM | POA: Diagnosis not present

## 2018-09-12 DIAGNOSIS — R000 Tachycardia, unspecified: Secondary | ICD-10-CM | POA: Diagnosis not present

## 2018-09-12 DIAGNOSIS — I480 Paroxysmal atrial fibrillation: Secondary | ICD-10-CM | POA: Diagnosis not present

## 2018-09-12 DIAGNOSIS — N184 Chronic kidney disease, stage 4 (severe): Secondary | ICD-10-CM | POA: Diagnosis not present

## 2018-09-12 DIAGNOSIS — I5032 Chronic diastolic (congestive) heart failure: Secondary | ICD-10-CM | POA: Diagnosis not present

## 2018-09-12 DIAGNOSIS — I4819 Other persistent atrial fibrillation: Secondary | ICD-10-CM | POA: Diagnosis present

## 2018-09-12 DIAGNOSIS — R42 Dizziness and giddiness: Secondary | ICD-10-CM | POA: Diagnosis not present

## 2018-09-12 DIAGNOSIS — Z96653 Presence of artificial knee joint, bilateral: Secondary | ICD-10-CM | POA: Diagnosis not present

## 2018-09-12 DIAGNOSIS — I959 Hypotension, unspecified: Secondary | ICD-10-CM | POA: Diagnosis not present

## 2018-09-12 DIAGNOSIS — E039 Hypothyroidism, unspecified: Secondary | ICD-10-CM | POA: Diagnosis not present

## 2018-09-12 DIAGNOSIS — I491 Atrial premature depolarization: Secondary | ICD-10-CM | POA: Diagnosis not present

## 2018-09-12 DIAGNOSIS — N390 Urinary tract infection, site not specified: Secondary | ICD-10-CM | POA: Diagnosis not present

## 2018-09-12 LAB — CBC WITH DIFFERENTIAL/PLATELET
Abs Immature Granulocytes: 0.06 10*3/uL (ref 0.00–0.07)
Basophils Absolute: 0.1 10*3/uL (ref 0.0–0.1)
Basophils Relative: 1 %
Eosinophils Absolute: 0.1 10*3/uL (ref 0.0–0.5)
Eosinophils Relative: 1 %
HCT: 41 % (ref 36.0–46.0)
Hemoglobin: 13.7 g/dL (ref 12.0–15.0)
Immature Granulocytes: 1 %
Lymphocytes Relative: 10 %
Lymphs Abs: 1.2 10*3/uL (ref 0.7–4.0)
MCH: 33.3 pg (ref 26.0–34.0)
MCHC: 33.4 g/dL (ref 30.0–36.0)
MCV: 99.8 fL (ref 80.0–100.0)
Monocytes Absolute: 0.7 10*3/uL (ref 0.1–1.0)
Monocytes Relative: 6 %
Neutro Abs: 9.7 10*3/uL — ABNORMAL HIGH (ref 1.7–7.7)
Neutrophils Relative %: 81 %
Platelets: 217 10*3/uL (ref 150–400)
RBC: 4.11 MIL/uL (ref 3.87–5.11)
RDW: 13.3 % (ref 11.5–15.5)
WBC: 11.9 10*3/uL — ABNORMAL HIGH (ref 4.0–10.5)
nRBC: 0 % (ref 0.0–0.2)

## 2018-09-12 LAB — COMPREHENSIVE METABOLIC PANEL
ALT: 12 U/L (ref 0–44)
AST: 21 U/L (ref 15–41)
Albumin: 3.5 g/dL (ref 3.5–5.0)
Alkaline Phosphatase: 41 U/L (ref 38–126)
Anion gap: 12 (ref 5–15)
BUN: 19 mg/dL (ref 8–23)
CO2: 24 mmol/L (ref 22–32)
Calcium: 9 mg/dL (ref 8.9–10.3)
Chloride: 96 mmol/L — ABNORMAL LOW (ref 98–111)
Creatinine, Ser: 1.25 mg/dL — ABNORMAL HIGH (ref 0.44–1.00)
GFR calc Af Amer: 42 mL/min — ABNORMAL LOW (ref >60)
GFR calc non Af Amer: 36 mL/min — ABNORMAL LOW (ref >60)
Glucose, Bld: 110 mg/dL — ABNORMAL HIGH (ref 70–99)
Potassium: 4.3 mmol/L (ref 3.5–5.1)
Sodium: 132 mmol/L — ABNORMAL LOW (ref 135–145)
Total Bilirubin: 1 mg/dL (ref 0.3–1.2)
Total Protein: 5.3 g/dL — ABNORMAL LOW (ref 6.5–8.1)

## 2018-09-12 LAB — URINALYSIS, ROUTINE W REFLEX MICROSCOPIC
Bilirubin Urine: NEGATIVE
Glucose, UA: NEGATIVE mg/dL
Hgb urine dipstick: NEGATIVE
Ketones, ur: 5 mg/dL — AB
Nitrite: NEGATIVE
Protein, ur: NEGATIVE mg/dL
Specific Gravity, Urine: 1.006 (ref 1.005–1.030)
pH: 7 (ref 5.0–8.0)

## 2018-09-12 LAB — MAGNESIUM: Magnesium: 2 mg/dL (ref 1.7–2.4)

## 2018-09-12 LAB — GLUCOSE, CAPILLARY: Glucose-Capillary: 199 mg/dL — ABNORMAL HIGH (ref 70–99)

## 2018-09-12 LAB — TROPONIN I (HIGH SENSITIVITY)
Troponin I (High Sensitivity): 75 ng/L — ABNORMAL HIGH (ref <18)
Troponin I (High Sensitivity): 91 ng/L — ABNORMAL HIGH (ref <18)

## 2018-09-12 LAB — TSH: TSH: 2.264 u[IU]/mL (ref 0.350–4.500)

## 2018-09-12 LAB — SARS CORONAVIRUS 2 (TAT 6-24 HRS): SARS Coronavirus 2: NEGATIVE

## 2018-09-12 MED ORDER — LEVOTHYROXINE SODIUM 75 MCG PO TABS
150.0000 ug | ORAL_TABLET | Freq: Every day | ORAL | Status: DC
  Administered 2018-09-13: 06:00:00 150 ug via ORAL
  Filled 2018-09-12: qty 2

## 2018-09-12 MED ORDER — CALCIUM CARBONATE-VITAMIN D 500-200 MG-UNIT PO TABS
1.0000 | ORAL_TABLET | Freq: Every day | ORAL | Status: DC
  Administered 2018-09-13: 1 via ORAL
  Filled 2018-09-12: qty 1

## 2018-09-12 MED ORDER — ACETAMINOPHEN 325 MG PO TABS: 650.0000 mg | ORAL_TABLET | ORAL | Status: DC | PRN

## 2018-09-12 MED ORDER — AMIODARONE HCL 200 MG PO TABS
200.0000 mg | ORAL_TABLET | Freq: Every day | ORAL | Status: DC
  Administered 2018-09-13: 200 mg via ORAL
  Filled 2018-09-12: qty 1

## 2018-09-12 MED ORDER — PREDNISONE 5 MG PO TABS
5.0000 mg | ORAL_TABLET | Freq: Every day | ORAL | Status: DC
  Administered 2018-09-13: 5 mg via ORAL
  Filled 2018-09-12: qty 1

## 2018-09-12 MED ORDER — DIGOXIN 125 MCG PO TABS
0.0625 mg | ORAL_TABLET | ORAL | Status: DC
  Administered 2018-09-13: 0.0625 mg via ORAL
  Filled 2018-09-12: qty 1

## 2018-09-12 MED ORDER — TRAMADOL HCL 50 MG PO TABS
50.0000 mg | ORAL_TABLET | Freq: Every day | ORAL | Status: DC | PRN
  Administered 2018-09-12: 50 mg via ORAL
  Filled 2018-09-12: qty 1

## 2018-09-12 MED ORDER — ONDANSETRON HCL 4 MG/2ML IJ SOLN: 4.0000 mg | Freq: Four times a day (QID) | INTRAMUSCULAR | Status: DC | PRN

## 2018-09-12 MED ORDER — DIGOXIN 0.25 MG/ML IJ SOLN
0.1250 mg | Freq: Once | INTRAMUSCULAR | Status: AC
  Administered 2018-09-12: 17:00:00 0.125 mg via INTRAVENOUS
  Filled 2018-09-12: qty 2

## 2018-09-12 MED ORDER — DIGOXIN 0.25 MG/ML IJ SOLN
0.1250 mg | Freq: Once | INTRAMUSCULAR | Status: AC
  Administered 2018-09-12: 0.125 mg via INTRAVENOUS
  Filled 2018-09-12: qty 2

## 2018-09-12 MED ORDER — APIXABAN 2.5 MG PO TABS
2.5000 mg | ORAL_TABLET | Freq: Two times a day (BID) | ORAL | Status: DC
  Administered 2018-09-12 – 2018-09-13 (×2): 2.5 mg via ORAL
  Filled 2018-09-12 (×2): qty 1

## 2018-09-12 MED ORDER — NITROFURANTOIN MACROCRYSTAL 50 MG PO CAPS: 50.0000 mg | ORAL_CAPSULE | Freq: Every day | ORAL | Status: DC

## 2018-09-12 MED ORDER — ACETAMINOPHEN 500 MG PO TABS
1000.0000 mg | ORAL_TABLET | Freq: Every day | ORAL | Status: DC
  Administered 2018-09-12: 1000 mg via ORAL
  Filled 2018-09-12: qty 2

## 2018-09-12 MED ORDER — GABAPENTIN 100 MG PO CAPS
100.0000 mg | ORAL_CAPSULE | Freq: Every day | ORAL | Status: DC
  Administered 2018-09-13: 16:00:00 100 mg via ORAL
  Filled 2018-09-12: qty 1

## 2018-09-12 MED ORDER — ACETAMINOPHEN 500 MG PO TABS
500.0000 mg | ORAL_TABLET | Freq: Two times a day (BID) | ORAL | Status: DC
  Administered 2018-09-13 (×2): 500 mg via ORAL
  Filled 2018-09-12 (×2): qty 1

## 2018-09-12 MED ORDER — LUBIPROSTONE 8 MCG PO CAPS
8.0000 ug | ORAL_CAPSULE | Freq: Three times a day (TID) | ORAL | Status: DC
  Administered 2018-09-12 – 2018-09-13 (×3): 8 ug via ORAL
  Filled 2018-09-12 (×5): qty 1

## 2018-09-12 MED ORDER — CAMPHOR-MENTHOL-METHYL SAL 3.1-6-10 % TD PTCH: 1.0000 | MEDICATED_PATCH | Freq: Every evening | TRANSDERMAL | Status: DC | PRN

## 2018-09-12 MED ORDER — RISAQUAD PO CAPS
ORAL_CAPSULE | Freq: Every day | ORAL | Status: DC
  Administered 2018-09-13: 09:00:00 1 via ORAL
  Filled 2018-09-12: qty 1

## 2018-09-12 MED ORDER — GABAPENTIN 100 MG PO CAPS
200.0000 mg | ORAL_CAPSULE | Freq: Two times a day (BID) | ORAL | Status: DC
  Administered 2018-09-12 – 2018-09-13 (×2): 200 mg via ORAL
  Filled 2018-09-12 (×2): qty 2

## 2018-09-12 MED ORDER — SODIUM CHLORIDE 0.9 % IV BOLUS
500.0000 mL | Freq: Once | INTRAVENOUS | Status: AC
  Administered 2018-09-12: 500 mL via INTRAVENOUS

## 2018-09-12 NOTE — Telephone Encounter (Signed)
   Patient's daughter, Mardene Celeste, call the after-hours line to report that her month seemed to be back in atrial fibrillation this morning. She has had several episodes in the past week. She called the office yesterday and was instructed to increase the patient's amiodarone to 200mg  daily, which the patient did this morning. She reported patient woke up, ate breakfast, and was feeling fine, then when getting up from her bedside commode, felt sudden onset palpitations. Patient reported an erratic heart beat. She feels significant lightheadedness and pre-syncope when attempting to sit upright. Daughter has had difficulty obtaining blood pressures as the machine reads ERROR. She was able to get one reading with BP in the 80s/60s with HR 64. Given hypotension and pre-syncope with suspected recurrent atrial fibrillation, recommended patient present to the ED via EMS. Daughter in agreement to activate EMS. ED triage nurse notified of patients pending arrival.   Cardiology will be available if needed. She is on apixaban 2.5mg  BID (adjusted for Age/ weight) for stroke ppx and denies any missed doses.    Abigail Butts, PA-C 09/12/18; 1:03 PM

## 2018-09-12 NOTE — ED Notes (Signed)
Cardiology MD at bedside.

## 2018-09-12 NOTE — ED Provider Notes (Addendum)
[facility] Provider Note   CSN: [id number] Arrival date & time: 09/12/18  1408     History   Chief Complaint Chief Complaint  Patient presents with  . Hypotension  . Tachycardia    HPI [name] is a 85 y.o. female.     The history is provided by the patient, a relative and medical records. No language interpreter was used.  Palpitations Palpitations quality:  Fast Onset quality:  Sudden Duration:  3 days Timing:  Intermittent Progression:  Waxing and waning Chronicity:  Recurrent Relieved by:  Nothing Worsened by:  Nothing Ineffective treatments: amiodarone. Associated symptoms: chest pain, chest pressure, malaise/fatigue and near-syncope   Associated symptoms: no back pain, no cough, no diaphoresis, no dizziness, no lower extremity edema, no nausea, no numbness, no shortness of breath, no syncope, no vomiting and no weakness   Risk factors: hx of atrial fibrillation     Past Medical History:  Diagnosis Date  . Blindness   . Blood clot associated with vein wall inflammation   . CKD (chronic kidney disease), stage III ([place])   . Colitis   . Goiter    s/p radioactive iod. tx  . Hypothyroidism   . On amiodarone therapy   . Paroxysmal atrial fibrillation (HCC)   . Pulmonary embolus (HCC)    and DVT s/p IVC filter  . SBO (small bowel obstruction) ([place])   . Spinal stenosis of lumbar region with radiculopathy    possible neurogenic claudication recently?  . Temporal arteritis [facility])     Patient Active Problem List   Diagnosis Date Noted  . Anticoagulated on warfarin 11/23/2016  . Encounter for monitoring amiodarone therapy 07/07/2016  . Mild anemia 11/28/2015  . Chronic diastolic heart failure ([place]) 12/29/2013  . SBO (small bowel obstruction) ([place]) 01/17/2013  . Microscopic colitis 01/17/2013  . Leukocytosis 01/17/2013  . Aspiration pneumonia ([place]) 01/17/2013  . CKD (chronic kidney disease), stage IV ([place])   .  SYNCOPE 09/21/2008  . Disorder of thyroid 09/17/2008  . Atrial fibrillation ([place]) 09/17/2008  . TEMPORAL ARTERITIS 09/17/2008  . Arthropathy 09/17/2008  . DYSPNEA ON EXERTION 09/17/2008    Past Surgical History:  Procedure Laterality Date  . APPENDECTOMY  1940  . BACK SURGERY  04/1998   spinal stenosis  . BACK SURGERY  2000   Ruptured Disc  . BREAST BIOPSY     cysts in both breasts  . CARPAL TUNNEL RELEASE  2002  . ivc filter    . PARTIAL HYSTERECTOMY    . TONSILLECTOMY  1934  . TOTAL KNEE ARTHROPLASTY Left 04/1999  . TOTAL KNEE ARTHROPLASTY Right 02/2000     OB History   No obstetric history on file.      Home Medications    Prior to Admission medications   Medication Sig Start Date End Date Taking? Authorizing Provider  amiodarone (PACERONE) 200 MG tablet Take 1 tablet (200 mg total) by mouth daily. 09/11/18   Belva Crome, MD  apixaban (ELIQUIS) 2.5 MG TABS tablet Take 1 tablet (2.5 mg total) by mouth 2 (two) times daily. 04/16/18   Belva Crome, MD  Calcium Carbonate-Vitamin D (CALCIUM HIGH POTENCY/VITAMIN D) 600-200 MG-UNIT TABS Take by mouth.    [provider]  CRANBERRY EXTRACT PO Take by mouth daily. Take 2 tablets twice a day.    [provider]  gabapentin (NEURONTIN) 100 MG capsule Take 200 mg by mouth 3 (three) times daily.  [provider]  levothyroxine (SYNTHROID, LEVOTHROID) 150 MCG tablet Take 150 mcg by mouth daily before breakfast.    [provider]  lubiprostone (AMITIZA) 8 MCG capsule Take 8 mcg by mouth 3 (three) times daily with meals.     [provider]  Misc Natural Products (GLUCOSAMINE CHONDROITIN COMPLX) TABS Take 1 tablet by mouth 2 (two) times daily.     [provider]  Misc Natural Products (OSTEO BI-FLEX TRIPLE STRENGTH PO) Osteo Bi-Flex Triple Strength    [provider]  Multiple Vitamins-Minerals (OCUVITE ADULT 50+ PO) Take 1 tablet by mouth daily.    [provider]  nitrofurantoin (MACRODANTIN) 50 MG capsule Take 50 mg by mouth daily.    [provider]  nitroGLYCERIN (NITROSTAT) 0.4 MG SL tablet Place 1 tablet (0.4 mg total) under the tongue every 5 (five) minutes as needed for chest pain. 09/07/18 12/06/18  Belva Crome, MD  NONFORMULARY OR COMPOUNDED [place]  Anti-Inflammatory Cream- Diclofenac 3%, Baclofen 2%, Lidocaine 2% Apply 1-2 grams to affected area 3-4 times daily Qty. 120 gm 3 refills 01/22/17   Gardiner Barefoot, DPM  predniSONE (DELTASONE) 5 MG tablet prednisone 5 mg tablet    [provider]  Probiotic Product ([place]) Take by mouth.    [provider]  traMADol (ULTRAM) 50 MG tablet tramadol 50 mg tablet  Take 1 tablet twice a day by oral route as needed.    [provider]    Family History Family History  Problem Relation Age of Onset  . Breast cancer Sister   . Congestive Heart Failure Mother   . Stroke Father   . Crohn's disease Neg Hx   . Ulcerative colitis Neg Hx     Social History Social History   Tobacco Use  . Smoking status: Never Smoker  . Smokeless tobacco: Never Used  Substance Use Topics  . Alcohol use: No  . Drug use: No     Allergies   Hydrocortisone, Sulfa antibiotics, Cefdinir, Cortisone, Prednisone, Sulfasalazine, and Sulfonamide derivatives   Review of Systems Review of Systems  Constitutional: Positive for fatigue and malaise/fatigue. Negative for chills, diaphoresis and fever.  HENT: Negative for congestion.   Respiratory: Negative for cough, chest tightness, shortness of breath, wheezing and stridor.   Cardiovascular: Positive for chest pain, palpitations and near-syncope. Negative for leg swelling and syncope.  Gastrointestinal: Negative for abdominal pain, constipation, diarrhea, nausea and vomiting.  Genitourinary: Negative for dysuria, flank pain and frequency.  Musculoskeletal: Negative for back pain,  neck pain and neck stiffness.  Skin: Negative for rash and wound.  Neurological: Positive for light-headedness. Negative for dizziness, syncope, weakness, numbness and headaches.  Psychiatric/Behavioral: Negative for agitation and confusion.  All other systems reviewed and are negative.    Physical Exam Updated Vital Signs BP (!) 87/58 (BP Location: Right Arm)   Pulse (!) 147   Temp 97.8 F (36.6 C) (Oral)   Resp 18   Ht 5\' 5"  (1.651 m)   Wt 50.3 kg   SpO2 97%   BMI 18.45 kg/m   Physical Exam Vitals signs and nursing note reviewed.  Constitutional:      General: She is not in acute distress.    Appearance: She is not ill-appearing, toxic-appearing or diaphoretic.  HENT:     Head: Normocephalic and atraumatic.     Nose: No congestion or rhinorrhea.     Mouth/Throat:     Mouth: Mucous membranes are moist.  Pharynx: No oropharyngeal exudate.  Eyes:     Conjunctiva/sclera: Conjunctivae normal.     Pupils: Pupils are equal, round, and reactive to light.  Cardiovascular:     Rate and Rhythm: Tachycardia present. Rhythm irregular.     Pulses: Normal pulses.     Heart sounds: Normal heart sounds. No murmur.  Pulmonary:     Effort: Pulmonary effort is normal. No respiratory distress.     Breath sounds: Normal breath sounds. No stridor. No wheezing, rhonchi or rales.  Chest:     Chest wall: No tenderness.  Abdominal:     General: Abdomen is flat. There is no distension.     Tenderness: There is no abdominal tenderness.  Musculoskeletal:        General: No tenderness.     Right lower leg: No edema.     Left lower leg: No edema.  Skin:    General: Skin is warm.     Capillary Refill: Capillary refill takes less than 2 seconds.     Findings: No erythema.  Neurological:     General: No focal deficit present.     Mental Status: She is alert.     Sensory: No sensory deficit.     Motor: No weakness.  Psychiatric:        Mood and Affect: Mood normal.      ED  Treatments / Results  Labs (all labs ordered are listed, but only abnormal results are displayed) Labs Reviewed  CBC WITH DIFFERENTIAL/PLATELET - Abnormal; Notable for the following components:      Result Value   WBC 11.9 (*)    Neutro Abs 9.7 (*)    All other components within normal limits  COMPREHENSIVE METABOLIC PANEL - Abnormal; Notable for the following components:   Sodium 132 (*)    Chloride 96 (*)    Glucose, Bld 110 (*)    Creatinine, Ser 1.25 (*)    Total Protein 5.3 (*)    GFR calc non Af Amer 36 (*)    GFR calc Af Amer 42 (*)    All other components within normal limits  TROPONIN I (HIGH SENSITIVITY) - Abnormal; Notable for the following components:   Troponin I (High Sensitivity) 75 (*)    All other components within normal limits  URINE CULTURE  MAGNESIUM  TSH  URINALYSIS, ROUTINE W REFLEX MICROSCOPIC  TROPONIN I (HIGH SENSITIVITY)    EKG None  ED ECG REPORT   Date: 09/12/2018  Rate: 147  Rhythm: atrial fibrillation  QRS Axis: left  Intervals: normal  ST/T Wave abnormalities: nonspecific ST changes  Conduction Disutrbances:none and right bundle branch block  Narrative Interpretation:   Old EKG Reviewed: now afib with RVR vs SVT  I have personally reviewed the EKG tracing and agree with the computerized printout as noted.     Radiology Dg Chest Portable 1 View  Result Date: 09/12/2018 CLINICAL DATA:  85 year old female with orthostatic hypotension EXAM: PORTABLE CHEST 1 VIEW COMPARISON:  Prior chest x-ray 09/06/2018 FINDINGS: Stable cardiomegaly. Mediastinal contours are also unchanged. Atherosclerotic calcifications are present in the transverse aorta. No pulmonary edema, pleural effusion or pneumothorax. No acute osseous abnormality. Stable multifocal degenerative changes. IMPRESSION: Stable chest x-ray without acute cardiopulmonary process. Electronically Signed   By: Jacqulynn Cadet M.D.   On: 09/12/2018 14:50    Procedures Procedures  (including critical care time)  CRITICAL CARE Performed by: Gwenyth Allegra  Total critical care time: 35 minutes Critical care time was exclusive of separately  billable procedures and treating other patients. Critical care was necessary to treat or prevent imminent or life-threatening deterioration. Critical care was time spent personally by me on the following activities: development of treatment plan with patient and/or surrogate as well as nursing, discussions with consultants, evaluation of patient's response to treatment, examination of patient, obtaining history from patient or surrogate, ordering and performing treatments and interventions, ordering and review of laboratory studies, ordering and review of radiographic studies, pulse oximetry and re-evaluation of patient's condition.   Medications Ordered in ED Medications  sodium chloride 0.9 % bolus 500 mL (0 mLs Intravenous Stopped 09/12/18 1558)     Initial Impression / Assessment and Plan / ED Course  I have reviewed the triage vital signs and the nursing notes.  Pertinent labs & imaging results that were available during my care of the patient were reviewed by me and considered in my medical decision making (see chart for details).        MELANNY WIRE is a 85 y.o. female with a past medical history significant for paroxysmal atrial fibrillation on Eliquis therapy, hypothyroidism, prior pulmonary embolism, prior temporal arteritis with blindness, CKD, and prior SBO who presents with palpitations and chest discomfort.  Patient is brought in by EMS as well as her daughter who reports that patient has had on and off episodes of chest pain and palpitations with racing heart for the last several days.  They spoke with cardiology yesterday who recommended doubling her amiodarone which they did.  She continued to have episodes and then this morning had a very fast rate on her pulse with A. fib and she had a near syncopal episode  when she tried to stand.  She had already received the double dose of amiodarone prior to this happening.  EMS found patient to be hypotensive with blood pressure in the 80s and brought her to the emergency department.  Patient still feels lightheaded and fatigued but reports her chest pain has improved.  She reports the chest pain comes and goes and denies shortness of breath.  She reports he did not pass out but was near syncopal.  She denies nausea, vomiting, urinary symptoms or GI symptoms.  She denies significant headache and denies trauma.  She reports being very fatigued but denies fevers or chills.  On exam, patient has a very fast pulse with a heart rate around 150 on EKG.  EKG shows SVT versus A. fib with RVR/a flutter.  Given her history of A. fib I suspect it is a flutter.  A vagal maneuver was attempted and appeared to show an a flutter type pattern.  Her abdomen was nontender and chest was nontender.  No lower extremity edema seen.  Despite fast heart rate, patient resting comfortably.  Patient's blood pressure was in the 80s on arrival.  A small metal fluid was given as patient has history of complications with fluid overload and wanted to minimize IV fluids at this time.  Heart rate began to slow down with the fluids.  Cardiology quickly called and they will come to the patient.  They agreed with some fluids and screening labs and work-up to look for occult infection.  Due to her supple pressures, will hold on diltiazem at this time.  Will wait for cardiology recommendations.  Anticipate following up on cardiology recommendations however anticipate patient will require admission.  As patient blood pressure is in the 16X to 096 systolic and she is mentating well without constant pain, do not feel  she needs electrocardioversion at this time.  Anticipate admission to cardiology.  Care transferred to oncoming team patient currently is doing awaiting results of consultation.   Final  Clinical Impressions(s) / ED Diagnoses   Final diagnoses:  Hypotension, unspecified hypotension type    Clinical Impression: 1. Hypotension, unspecified hypotension type     Disposition: Awaiting cardiology recommendations, anticipate admission.  This note was prepared with assistance of Systems analyst. Occasional wrong-word or sound-a-like substitutions may have occurred due to the inherent limitations of voice recognition software.      , Gwenyth Allegra, MD 09/12/18 1607    , Gwenyth Allegra, MD 09/12/18 [phone]

## 2018-09-12 NOTE — Progress Notes (Signed)
Pt arrived to unit

## 2018-09-12 NOTE — ED Notes (Signed)
Pt aware we need urine sample, still unable to provide.

## 2018-09-12 NOTE — ED Notes (Signed)
Notified MD, Tegeler of patients bp [72/55 (62)] repeat 70/56 (62). MD at bedside gave verbal order for 500 cc ns bolus at this time. MD updated patient and her daughter as well.

## 2018-09-12 NOTE — ED Triage Notes (Signed)
Pt arrived via [facility] with c/o low bp at home. Per ems, patients family states that she has been hypotensive x2 weeks. Family monitors patient bp at home. Patients heart rate was also 145 (sinus tach vs a. Fib) with ems. Pt also has some dizziness with ambulation. Patient has a hx of a.fib, took amiodarone this morning.   Pts states she can feel her heart racing.

## 2018-09-12 NOTE — H&P (Signed)
Cardiology Admission History and Physical:   Patient ID: [name] MRN: [id number]; DOB: [birth year]   Admission date: 09/12/2018  Primary Care Provider: Antony Contras, MD Primary Cardiologist: No primary care provider on file.  Primary Electrophysiologist:  None   Chief Complaint:  Atrial fibrillation    Patient Profile:   [name] is a 85 y.o. female with paroxysmal atrial fibrillation, prior PE on anticoagulation and CKD 2 admitted with atrial fibrillation with rapid ventricular response and hypotension.  History of Present Illness:   Ms. Benard was initially diagnosed with atrial fibrillation 10 years ago.  She did very well on amiodarone 100 mg daily for 10 years until 1 week ago when she developed atrial fibrillation with rapid ventricular response.  At the time she felt weak and had a strange feeling in her chest.  She was seen at the [facility] walk-in clinic on 8/23 where there was reportedly concern for ischemia.  She was referred to the emergency department where troponin was negative and there were no ischemic changes on EKG.  However prior to discharge she went into atrial fibrillation with rates in the 90s to the 130s.  Amiodarone was increased from 100 mg to 200 mg.  Since that time she is had intermittent episodes of feeling the same way.  When her heart starts racing she gets lightheaded and has discomfort in her chest that radiates to her back.  Her blood pressure also becomes low and her home blood pressure monitor is not able to report an actual value for her blood pressure.  This morning her heart rate went up again and she became nearly syncopal.  Therefore her daughter brought her to the emergency department.  In the ED she was noted to be in atrial fibrillation with rapid ventricular response up to the 150s.  She has also been hypotensive with systolic blood pressures ranging in the 70s to the 90s.  She currently denies chest pain or palpitations.  She has not experienced  any lower extremity edema but notes that her right lower extremity feels cold at times.  She wonders if this is attributable to her peripheral neuropathy.  Heart Pathway Score:     Past Medical History:  Diagnosis Date   Blindness    Blood clot associated with vein wall inflammation    CKD (chronic kidney disease), stage III (HCC)    Colitis    Goiter    s/p radioactive iod. tx   Hypothyroidism    On amiodarone therapy    Paroxysmal atrial fibrillation (HCC)    Pulmonary embolus (HCC)    and DVT s/p IVC filter   SBO (small bowel obstruction) (HCC)    Spinal stenosis of lumbar region with radiculopathy    possible neurogenic claudication recently?   Temporal arteritis [facility])     Past Surgical History:  Procedure Laterality Date   APPENDECTOMY  1940   BACK SURGERY  04/1998   spinal stenosis   BACK SURGERY  2000   Ruptured Disc   BREAST BIOPSY     cysts in both breasts   CARPAL TUNNEL RELEASE  2002   ivc filter     PARTIAL HYSTERECTOMY     TONSILLECTOMY  1934   TOTAL KNEE ARTHROPLASTY Left 04/1999   TOTAL KNEE ARTHROPLASTY Right 02/2000     Medications Prior to Admission: Prior to Admission medications   Medication Sig Start Date End Date Taking? Authorizing Provider  amiodarone (PACERONE) 200 MG tablet Take 1 tablet (200 mg  total) by mouth daily. 09/11/18   Belva Crome, MD  apixaban (ELIQUIS) 2.5 MG TABS tablet Take 1 tablet (2.5 mg total) by mouth 2 (two) times daily. 04/16/18   Belva Crome, MD  Calcium Carbonate-Vitamin D (CALCIUM HIGH POTENCY/VITAMIN D) 600-200 MG-UNIT TABS Take by mouth.    [provider]  CRANBERRY EXTRACT PO Take by mouth daily. Take 2 tablets twice a day.    [provider]  gabapentin (NEURONTIN) 100 MG capsule Take 200 mg by mouth 3 (three) times daily.     [provider]  levothyroxine (SYNTHROID, LEVOTHROID) 150 MCG tablet Take 150 mcg by mouth daily before breakfast.    [provider]   lubiprostone (AMITIZA) 8 MCG capsule Take 8 mcg by mouth 3 (three) times daily with meals.     [provider]  Misc Natural Products (GLUCOSAMINE CHONDROITIN COMPLX) TABS Take 1 tablet by mouth 2 (two) times daily.     [provider]  Misc Natural Products (OSTEO BI-FLEX TRIPLE STRENGTH PO) Osteo Bi-Flex Triple Strength    [provider]  Multiple Vitamins-Minerals (OCUVITE ADULT 50+ PO) Take 1 tablet by mouth daily.    [provider]  nitrofurantoin (MACRODANTIN) 50 MG capsule Take 50 mg by mouth daily.    [provider]  nitroGLYCERIN (NITROSTAT) 0.4 MG SL tablet Place 1 tablet (0.4 mg total) under the tongue every 5 (five) minutes as needed for chest pain. 09/07/18 12/06/18  Belva Crome, MD  NONFORMULARY OR COMPOUNDED [place]  Anti-Inflammatory Cream- Diclofenac 3%, Baclofen 2%, Lidocaine 2% Apply 1-2 grams to affected area 3-4 times daily Qty. 120 gm 3 refills 01/22/17   Gardiner Barefoot, DPM  predniSONE (DELTASONE) 5 MG tablet prednisone 5 mg tablet    [provider]  Probiotic Product ([place]) Take by mouth.    [provider]  traMADol (ULTRAM) 50 MG tablet tramadol 50 mg tablet  Take 1 tablet twice a day by oral route as needed.    [provider]     Allergies:    Allergies  Allergen Reactions   Hydrocortisone Other (See Comments)   Sulfa Antibiotics Nausea Only   Cefdinir Other (See Comments)    Caused bad diarrhea Caused bad diarrhea   Cortisone Other (See Comments)    Flushed hands and face  Flushed hands and face   Prednisone Other (See Comments)    cuased blood clots Can have very small dose cuased blood clots   Sulfasalazine Other (See Comments)    Nausea side effects Stomach pains and cramping   Sulfonamide Derivatives     Stomach pains and cramping     Social History:   Social History   Socioeconomic History   Marital status: Widowed      Spouse name: Not on file   Number of children: 4   Years of education: College   Highest education level: Not on file  Occupational History    Employer: RETIRED    Comment: retired  Scientist, product/process development strain: Not on file   Food insecurity    Worry: Not on file    Inability: Not on Lexicographer needs    Medical: Not on file    Non-medical: Not on file  Tobacco Use   Smoking status: Never Smoker   Smokeless tobacco: Never Used  Substance and Sexual Activity   Alcohol use: No   Drug use: No   Sexual  activity: Never  Lifestyle   Physical activity    Days per week: Not on file    Minutes per session: Not on file   Stress: Not on file  Relationships   Social connections    Talks on phone: Not on file    Gets together: Not on file    Attends religious service: Not on file    Active member of club or organization: Not on file    Attends meetings of clubs or organizations: Not on file    Relationship status: Not on file   Intimate partner violence    Fear of current or ex partner: Not on file    Emotionally abused: Not on file    Physically abused: Not on file    Forced sexual activity: Not on file  Other Topics Concern   Not on file  Social History Narrative   Patient lives at home alone.  Use walker to get around   Caffeine Use: rarely   Patient is right handed    Family History:   The patient's family history includes Breast cancer in her sister; Congestive Heart Failure in her mother; Stroke in her father. There is no history of Crohn's disease or Ulcerative colitis.    ROS:  Please see the history of present illness.  All other ROS reviewed and negative.     Physical Exam/Data:   Vitals:   09/12/18 1430 09/12/18 1445 09/12/18 1515 09/12/18 1558  BP: (!) 84/59 100/79 (!) 72/55 96/72  Pulse: (!) 147 (!) 145 (!) 58 (!) 36  Resp: 15 16 18  (!) 24  Temp:      TempSrc:      SpO2: 97% 99% 97% 98%  Weight:      Height:         Intake/Output Summary (Last 24 hours) at 09/12/2018 1616 Last data filed at 09/12/2018 1558 Gross per 24 hour  Intake 500 ml  Output --  Net 500 ml   Last 3 Weights 09/12/2018 09/10/2018 09/06/2018  Weight (lbs) 110 lb 14.3 oz 111 lb 125 lb  Weight (kg) 50.3 kg 50.349 kg 56.7 kg     VS:  BP 96/72    Pulse (!) 36    Temp 97.8 F (36.6 C) (Oral)    Resp (!) 24    Ht 5\' 5"  (1.651 m)    Wt 50.3 kg    SpO2 98%    BMI 18.45 kg/m  , BMI Body mass index is 18.45 kg/m. GENERAL:  Well appearing, frail, elderly woman in no acute distress. HEENT: Pupils equal round and reactive, fundi not visualized, oral mucosa unremarkable NECK:  No jugular venous distention, waveform within normal limits, carotid upstroke brisk and symmetric, no bruits, no thyromegaly LYMPHATICS:  No cervical adenopathy LUNGS:  Clear to auscultation bilaterally HEART: Tachycardic.  Irregularly irregular.Marland Kitchen  PMI not displaced or sustained,S1 and S2 within normal limits, no S3, no S4, no clicks, no rubs, no murmurs ABD:  Flat, positive bowel sounds normal in frequency in pitch, no bruits, no rebound, no guarding, no midline pulsatile mass, no hepatomegaly, no splenomegaly EXT:  2 plus pulses throughout, no edema, no cyanosis no clubbing SKIN:  No rashes no nodules NEURO:  Cranial nerves II through XII grossly intact, motor grossly intact throughout PSYCH:  Cognitively intact, oriented to person place and time   EKG:  The ECG that was done 09/11/18 was personally reviewed and demonstrates atrial fibrillation.  Rate 145 bpm.  Incomplete right bundle branch  block.  LAFB.  Relevant CV Studies: 09/10/18: IMPRESSIONS    1. The left ventricle has normal systolic function, with an ejection fraction of 55-60%. The cavity size was normal. There is moderately increased left ventricular wall thickness. Left ventricular diastolic Doppler parameters are consistent with  pseudonormalization. Elevated left ventricular end-diastolic  pressure No evidence of left ventricular regional wall motion abnormalities.  2. The right ventricle has normal systolic function. The cavity was normal. There is no increase in right ventricular wall thickness. Right ventricular systolic pressure could not be assessed.  3. Left atrial size was mildly dilated.  4. The pericardial effusion is localized near the right atrium and anterior to the right ventricle.  5. Trivial pericardial effusion is present.  6. Mild thickening of the mitral valve leaflet. Mild calcification of the mitral valve leaflet. There is moderate mitral annular calcification present.  7. The aortic valve is tricuspid. Moderate sclerosis of the aortic valve. Aortic valve regurgitation is mild by color flow Doppler.  8. The aorta is normal unless otherwise noted.  9. The interatrial septum appears to be lipomatous.   Laboratory Data:  High Sensitivity Troponin:   Recent Labs  Lab 09/06/18 1638 09/06/18 1836 09/12/18 1413  TROPONINIHS 51* 49* 75*      Chemistry Recent Labs  Lab 09/06/18 1638 09/12/18 1413  NA 133* 132*  K 4.1 4.3  CL 96* 96*  CO2 24 24  GLUCOSE 113* 110*  BUN 20 19  CREATININE 1.20* 1.25*  CALCIUM 9.2 9.0  GFRNONAA 38* 36*  GFRAA 44* 42*  ANIONGAP 13 12    Recent Labs  Lab 09/06/18 1836 09/12/18 1413  PROT 5.8* 5.3*  ALBUMIN 3.7 3.5  AST 23 21  ALT 17 12  ALKPHOS 47 41  BILITOT 1.2 1.0   Hematology Recent Labs  Lab 09/06/18 1638 09/12/18 1413  WBC 10.6* 11.9*  RBC 4.19 4.11  HGB 14.0 13.7  HCT 42.7 41.0  MCV 101.9* 99.8  MCH 33.4 33.3  MCHC 32.8 33.4  RDW 13.2 13.3  PLT 235 217   BNPNo results for input(s): BNP, PROBNP in the last 168 hours.  DDimer No results for input(s): DDIMER in the last 168 hours.   Radiology/Studies:  Dg Chest Portable 1 View  Result Date: 09/12/2018 CLINICAL DATA:  85 year old female with orthostatic hypotension EXAM: PORTABLE CHEST 1 VIEW COMPARISON:  Prior chest x-ray 09/06/2018  FINDINGS: Stable cardiomegaly. Mediastinal contours are also unchanged. Atherosclerotic calcifications are present in the transverse aorta. No pulmonary edema, pleural effusion or pneumothorax. No acute osseous abnormality. Stable multifocal degenerative changes. IMPRESSION: Stable chest x-ray without acute cardiopulmonary process. Electronically Signed   By: Jacqulynn Cadet M.D.   On: 09/12/2018 14:50    Assessment and Plan:   # Atrial fibrillation with RVR: Ms. Tatum is here with recurrent atrial fibrillation with rapid ventricular response despite increasing her amiodarone dose to 200 mg daily.  She feels poorly when her heart is racing and she is hypotensive to the 70s to 90s.  She has received a 500 mL bolus with some improvement in her blood pressure.  She does not have any room for nodal agents.  On telemetry she is in and out of atrial fibrillation, but mostly in A. fib.  Therefore, I do not think it would be helpful to do a cardioversion at this time.  We will add digoxin 0.125 mg IV x2 doses followed by oral dosing to start tomorrow.  Her thyroid function is normal.  WBC  is mildly elevated but there are no clear sources of infection.  Urinalysis is pending.  Continue Eliquis.  Her daughter found 1 tablet on the floor last night so she thinks that she has missed 1 dose but otherwise is very compliant with all her medications.    Severity of Illness: The appropriate patient status for this patient is INPATIENT. Inpatient status is judged to be reasonable and necessary in order to provide the required intensity of service to ensure the patient's safety. The patient's presenting symptoms, physical exam findings, and initial radiographic and laboratory data in the context of their chronic comorbidities is felt to place them at high risk for further clinical deterioration. Furthermore, it is not anticipated that the patient will be medically stable for discharge from the hospital within 2 midnights  of admission. The following factors support the patient status of inpatient.   " The patient's presenting symptoms include near syncope, palpitation. " The worrisome physical exam findings include tachycardia, hypotension. " The initial radiographic and laboratory data are worrisome because of leukocytosis. " The chronic co-morbidities include age.   * I certify that at the point of admission it is my clinical judgment that the patient will require inpatient hospital care spanning beyond 2 midnights from the point of admission due to high intensity of service, high risk for further deterioration and high frequency of surveillance required.*    For questions or updates, please contact [place] Please consult [email or web address] for contact info under        Signed, Skeet Latch, MD  09/12/2018 4:16 PM

## 2018-09-12 NOTE — ED Notes (Signed)
Pt received 500cc Bolus at this time. okay'd per Dr. Sherry Ruffing.

## 2018-09-13 DIAGNOSIS — I4891 Unspecified atrial fibrillation: Secondary | ICD-10-CM | POA: Diagnosis not present

## 2018-09-13 DIAGNOSIS — I480 Paroxysmal atrial fibrillation: Secondary | ICD-10-CM | POA: Diagnosis not present

## 2018-09-13 DIAGNOSIS — N390 Urinary tract infection, site not specified: Secondary | ICD-10-CM

## 2018-09-13 LAB — CBC
HCT: 36.8 % (ref 36.0–46.0)
Hemoglobin: 12.3 g/dL (ref 12.0–15.0)
MCH: 33.1 pg (ref 26.0–34.0)
MCHC: 33.4 g/dL (ref 30.0–36.0)
MCV: 98.9 fL (ref 80.0–100.0)
Platelets: 203 10*3/uL (ref 150–400)
RBC: 3.72 MIL/uL — ABNORMAL LOW (ref 3.87–5.11)
RDW: 13.4 % (ref 11.5–15.5)
WBC: 8.2 10*3/uL (ref 4.0–10.5)
nRBC: 0 % (ref 0.0–0.2)

## 2018-09-13 LAB — BASIC METABOLIC PANEL
Anion gap: 9 (ref 5–15)
BUN: 17 mg/dL (ref 8–23)
CO2: 25 mmol/L (ref 22–32)
Calcium: 8.4 mg/dL — ABNORMAL LOW (ref 8.9–10.3)
Chloride: 102 mmol/L (ref 98–111)
Creatinine, Ser: 1.15 mg/dL — ABNORMAL HIGH (ref 0.44–1.00)
GFR calc Af Amer: 47 mL/min — ABNORMAL LOW (ref >60)
GFR calc non Af Amer: 40 mL/min — ABNORMAL LOW (ref >60)
Glucose, Bld: 80 mg/dL (ref 70–99)
Potassium: 4.2 mmol/L (ref 3.5–5.1)
Sodium: 136 mmol/L (ref 135–145)

## 2018-09-13 MED ORDER — AMOXICILLIN-POT CLAVULANATE 500-125 MG PO TABS
1.0000 | ORAL_TABLET | Freq: Two times a day (BID) | ORAL | Status: DC
  Filled 2018-09-13 (×2): qty 1

## 2018-09-13 MED ORDER — DIGOXIN 62.5 MCG PO TABS: 0.0625 mg | ORAL_TABLET | ORAL | 1 refills | Status: DC

## 2018-09-13 MED ORDER — AMOXICILLIN-POT CLAVULANATE 500-125 MG PO TABS: 1.0000 | ORAL_TABLET | Freq: Two times a day (BID) | ORAL | 0 refills | Status: AC

## 2018-09-13 NOTE — Care Management CC44 (Signed)
Condition Code 44 Documentation Completed  Patient Details  Name: [name] MRN: [id number] Date of Birth: [birth year]   Condition Code 44 given:  Yes Patient signature on Condition Code 44 notice:  Yes Documentation of 2 MD's agreement:  Yes Code 44 added to claim:  Yes    Claudie Leach, RN 09/13/2018, 3:19 PM

## 2018-09-13 NOTE — Discharge Summary (Signed)
Discharge Summary    Patient ID: [name] MRN: [id number]; DOB: 03-Feb-1920  Admit date: 09/12/2018 Discharge date: 09/13/2018  Primary Care Provider: Antony Contras, MD  Primary Cardiologist: Sinclair Grooms, MD  Primary Electrophysiologist:  None   Discharge Diagnoses    Active Problems:   Atrial fibrillation with RVR (HCC)   Persistent atrial fibrillation   UTI (urinary tract infection)   Allergies Allergies  Allergen Reactions   Hydrocortisone Other (See Comments)   Sulfa Antibiotics Nausea Only   Cefdinir Other (See Comments)    Caused bad diarrhea Caused bad diarrhea   Cortisone Other (See Comments)    Flushed hands and face  Flushed hands and face   Prednisone Other (See Comments)    cuased blood clots Can have very small dose cuased blood clots   Sulfasalazine Other (See Comments)    Nausea side effects Stomach pains and cramping   Sulfonamide Derivatives     Stomach pains and cramping     Diagnostic Studies/Procedures    Echocardiogram done recently, prior to admission 09/10/2018  IMPRESSIONS   1. The left ventricle has normal systolic function, with an ejection fraction of 55-60%. The cavity size was normal. There is moderately increased left ventricular wall thickness. Left ventricular diastolic Doppler parameters are consistent with  pseudonormalization. Elevated left ventricular end-diastolic pressure No evidence of left ventricular regional wall motion abnormalities.  2. The right ventricle has normal systolic function. The cavity was normal. There is no increase in right ventricular wall thickness. Right ventricular systolic pressure could not be assessed.  3. Left atrial size was mildly dilated.  4. The pericardial effusion is localized near the right atrium and anterior to the right ventricle.  5. Trivial pericardial effusion is present.  6. Mild thickening of the mitral valve leaflet. Mild calcification of the mitral valve leaflet.  There is moderate mitral annular calcification present.  7. The aortic valve is tricuspid. Moderate sclerosis of the aortic valve. Aortic valve regurgitation is mild by color flow Doppler.  8. The aorta is normal unless otherwise noted.  9. The interatrial septum appears to be lipomatous.  _____________   History of Present Illness     [name] is a 85 y.o. female with paroxysmal atrial fibrillation, prior PE on anticoagulation and CKD stage 2 admitted with atrial fibrillation with rapid ventricular response and hypotension.  [name] was initially diagnosed with atrial fibrillation 10 years ago.  She did very well on amiodarone 100 mg daily for 10 years until 1 week ago when she developed atrial fibrillation with rapid ventricular response.  At the time she felt weak and had a strange feeling in her chest.  She was seen at the [facility] walk-in clinic on 8/23 where there was reportedly concern for ischemia.  She was referred to the emergency department where troponin was negative and there were no ischemic changes on EKG.  However prior to discharge she went into atrial fibrillation with rates in the 90s to the 130s.  Amiodarone was increased from 100 mg to 200 mg.  Since that time she has had intermittent episodes of feeling the same way.  When her heart starts racing she gets lightheaded and has discomfort in her chest that radiates to her back.  Her blood pressure also becomes low and her home blood pressure monitor is not able to report an actual value for her blood pressure.      On the morning of presentation, 09/12/2018, her heart rate went  up again and she became nearly syncopal. Therefore, her daughter brought her to the emergency department.  In the ED she was noted to be in atrial fibrillation with rapid ventricular response up to the 150s.  She has also been hypotensive with systolic blood pressures ranging in the 70s to the 90s.  She denied chest pain or palpitations.  She had no lower  extremity edema but noted that her right lower extremity feels cold at times.  She wondered if this is attributable to her peripheral neuropathy.   Hospital Course     Consultants: None  The patient was given a 500 mL IV bolus with some improvement in her blood pressure.  She had no room for addition of any nodal agents.  Telemetry showed that the patient was in and out of atrial fibrillation, mostly in A. fib so it was not felt that cardioversion would be beneficial.  Digoxin was added.  Her thyroid function is normal.  WBCs were mildly elevated.  Urinalysis showed many bacteria and elevated WBCs, negative nitrites.  UTI may be contributing to her A. fib.  She will be treated with Augmentin for 14 days.  She has tolerated this medication in the past.  Blood pressure was initially low, but is now high.  She does not have a history of hypertension.  She will track her blood pressure at home and bring data to her follow-up appointment.  The patient ambulated today without dizziness.  Patient has been seen by Dr. Oval Linsey today and deemed ready for discharge home. All follow up appointments have been scheduled. Discharge medications are listed below.  _____________  Discharge Vitals Blood pressure (!) 156/69, pulse 61, temperature 98 F (36.7 C), temperature source Oral, resp. rate 17, height 5\' 5"  (1.651 m), weight 49.6 kg, SpO2 97 %.  Filed Weights   09/12/18 1420 09/13/18 0554  Weight: 50.3 kg 49.6 kg    Labs & Radiologic Studies    CBC Recent Labs    09/12/18 1413 09/13/18 0436  WBC 11.9* 8.2  NEUTROABS 9.7*  --   HGB 13.7 12.3  HCT 41.0 36.8  MCV 99.8 98.9  PLT 217 170   Basic Metabolic Panel Recent Labs    09/12/18 1413 09/13/18 0436  NA 132* 136  K 4.3 4.2  CL 96* 102  CO2 24 25  GLUCOSE 110* 80  BUN 19 17  CREATININE 1.25* 1.15*  CALCIUM 9.0 8.4*  MG 2.0  --    Liver Function Tests Recent Labs    09/12/18 1413  AST 21  ALT 12  ALKPHOS 41  BILITOT 1.0   PROT 5.3*  ALBUMIN 3.5   No results for input(s): LIPASE, AMYLASE in the last 72 hours. High Sensitivity Troponin:   Recent Labs  Lab 09/06/18 1638 09/06/18 1836 09/12/18 1413 09/12/18 1656  TROPONINIHS 51* 49* 75* 91*    BNP Invalid input(s): POCBNP D-Dimer No results for input(s): DDIMER in the last 72 hours. Hemoglobin A1C No results for input(s): HGBA1C in the last 72 hours. Fasting Lipid Panel No results for input(s): CHOL, HDL, LDLCALC, TRIG, CHOLHDL, LDLDIRECT in the last 72 hours. Thyroid Function Tests Recent Labs    09/12/18 1413  TSH 2.264   _____________  Dg Chest 2 View  Result Date: 09/06/2018 CLINICAL DATA:  Hypotension, weakness EXAM: CHEST - 2 VIEW COMPARISON:  12/30/2016 FINDINGS: Lungs are clear.  No pleural effusion or pneumothorax. The heart is normal in size.  Thoracic aortic atherosclerosis. IMPRESSION: No evidence of  acute cardiopulmonary disease. Thoracic aortic atherosclerosis. Electronically Signed   By: Julian Hy M.D.   On: 09/06/2018 17:45   Mr Jeri Cos JQ Contrast  Result Date: 08/25/2018 CLINICAL DATA:  Non intractable headache dizziness Creatinine was obtained on site at [facility] at [address] Results: Creatinine 1.1 mg/dL. EXAM: MRI HEAD WITHOUT AND WITH CONTRAST TECHNIQUE: Multiplanar, multiecho pulse sequences of the brain and surrounding structures were obtained without and with intravenous contrast. CONTRAST:  74mL MULTIHANCE GADOBENATE DIMEGLUMINE 529 MG/ML IV SOLN COMPARISON:  MRI head 04/26/2014 FINDINGS: Brain: Ventricle size and cerebral volume normal for age. Negative for hydrocephalus. Negative for acute infarct. Scattered white matter hyperintensities compatible with chronic ischemia. Mild patchy hyperintensity in the pons. Negative for hemorrhage mass or fluid collection. Normal enhancement postcontrast infusion. Vascular: Normal arterial flow voids.  Normal venous enhancement. Skull and upper cervical spine:  No focal bone lesion. Soft tissue thickening posterior to the dens compatible with pannus from arthropathy at C1-2. Sinuses/Orbits: Mild mucosal edema paranasal sinuses. Cataract surgery on the right Other: None IMPRESSION: No acute intracranial abnormality. Age-appropriate volume loss. Mild chronic microvascular ischemia in the white matter and pons. Electronically Signed   By: Franchot Gallo M.D.   On: 08/25/2018 09:56   Dg Chest Portable 1 View  Result Date: 09/12/2018 CLINICAL DATA:  85 year old female with orthostatic hypotension EXAM: PORTABLE CHEST 1 VIEW COMPARISON:  Prior chest x-ray 09/06/2018 FINDINGS: Stable cardiomegaly. Mediastinal contours are also unchanged. Atherosclerotic calcifications are present in the transverse aorta. No pulmonary edema, pleural effusion or pneumothorax. No acute osseous abnormality. Stable multifocal degenerative changes. IMPRESSION: Stable chest x-ray without acute cardiopulmonary process. Electronically Signed   By: Jacqulynn Cadet M.D.   On: 09/12/2018 14:50   Ct Maxillofacial Wo Contrast  Result Date: 08/28/2018 CLINICAL DATA:  Right facial pain and swelling. EXAM: CT MAXILLOFACIAL WITHOUT CONTRAST TECHNIQUE: Multidetector CT imaging of the maxillofacial structures was performed. Multiplanar CT image reconstructions were also generated. COMPARISON:  CT scan of March 18, 2014. FINDINGS: Osseous: No fracture or mandibular dislocation. No destructive process. Orbits: Negative. No traumatic or inflammatory finding. Sinuses: Clear. Soft tissues: Negative. Limited intracranial: No significant or unexpected finding. IMPRESSION: No definite abnormality seen in the maxillofacial region. Electronically Signed   By: Marijo Conception M.D.   On: 08/28/2018 12:02   Disposition   Pt is being discharged home today in good condition.  Follow-up Plans & Appointments    Follow-up Information    Belva Crome, MD Follow up.   Specialty: Cardiology Why: Dr. Thompson Caul office  will call you to arrange for cardiology hospital follow-up.  Please answer your phone for unknown callers.  Contact information: [address] [place] [address] [phone]          Discharge Instructions    Increase activity slowly   Complete by: As directed       Discharge Medications   Allergies as of 09/13/2018      Reactions   Hydrocortisone Other (See Comments)   Sulfa Antibiotics Nausea Only   Cefdinir Other (See Comments)   Caused bad diarrhea Caused bad diarrhea   Cortisone Other (See Comments)   Flushed hands and face Flushed hands and face   Prednisone Other (See Comments)   cuased blood clots Can have very small dose cuased blood clots   Sulfasalazine Other (See Comments)   Nausea side effects Stomach pains and cramping   Sulfonamide Derivatives    Stomach pains and  cramping      Medication List    STOP taking these medications   nitrofurantoin 50 MG capsule Commonly known as: MACRODANTIN     TAKE these medications   acetaminophen 500 MG tablet Commonly known as: TYLENOL Take 500-1,000 mg by mouth See admin instructions. Take 500mg  in the morning, 500mg  midday and 1000mg  at bedtime.   amiodarone 200 MG tablet Commonly known as: PACERONE Take 1 tablet (200 mg total) by mouth daily.   amoxicillin-clavulanate 500-125 MG tablet Commonly known as: AUGMENTIN Take 1 tablet (500 mg total) by mouth 2 (two) times daily for 14 days.   apixaban 2.5 MG Tabs tablet Commonly known as: Eliquis Take 1 tablet (2.5 mg total) by mouth 2 (two) times daily.   Calcium High Potency/Vitamin D 600-200 MG-UNIT Tabs Generic drug: Calcium Carbonate-Vitamin D Take 1 tablet by mouth daily.   CRANBERRY EXTRACT PO Take 2 tablets by mouth 2 (two) times daily.   Digoxin 62.5 MCG Tabs Take 0.0625 mg by mouth every other day. Start taking on: September 15, 2018   gabapentin 100 MG capsule Commonly known as: NEURONTIN Take 100-200 mg by mouth See  admin instructions. Take 200mg  in the morning, 100mg  midday and 200mg  at bedtime   Glucosamine Chondroitin Complx Tabs Take 1 tablet by mouth 2 (two) times daily.   levothyroxine 150 MCG tablet Commonly known as: SYNTHROID Take 150 mcg by mouth daily before breakfast.   lubiprostone 8 MCG capsule Commonly known as: AMITIZA Take 8 mcg by mouth 3 (three) times daily with meals.   nitroGLYCERIN 0.4 MG SL tablet Commonly known as: NITROSTAT Place 1 tablet (0.4 mg total) under the tongue every 5 (five) minutes as needed for chest pain.   OCUVITE ADULT 50+ PO Take 1 tablet by mouth daily.   predniSONE 5 MG tablet Commonly known as: DELTASONE Take 5 mg by mouth daily.   Salonpas 3.01-20-08 % Ptch Generic drug: Camphor-Menthol-Methyl Sal Place 1 patch onto the skin at bedtime as needed (pain).   traMADol 50 MG tablet Commonly known as: ULTRAM Take 50 mg by mouth daily as needed for moderate pain.   trolamine salicylate 10 % cream Commonly known as: ASPERCREME Apply 1 application topically as needed for muscle pain.   ULTRAFLORA IMMUNE HEALTH PO Take 1 tablet by mouth daily.        Acute coronary syndrome (MI, NSTEMI, STEMI, etc) this admission?: No.    Outstanding Labs/Studies   none  Duration of Discharge Encounter   Greater than 30 minutes including physician time.  Signed, Daune Perch, NP 09/13/2018, 2:01 PM

## 2018-09-13 NOTE — Progress Notes (Signed)
Progress Note  Patient Name: [name] Date of Encounter: 09/13/2018  Primary Cardiologist: Sinclair Grooms, MD   Subjective   Feeling much better.  Ambulated without dizziness.  Inpatient Medications    Scheduled Meds: . acetaminophen  1,000 mg Oral QHS  . acetaminophen  500 mg Oral BID  . acidophilus   Oral Daily  . amiodarone  200 mg Oral Daily  . apixaban  2.5 mg Oral BID  . calcium-vitamin D  1 tablet Oral Q breakfast  . digoxin  0.0625 mg Oral Q48H  . gabapentin  100 mg Oral Q1200  . gabapentin  200 mg Oral BID  . levothyroxine  150 mcg Oral QAC breakfast  . lubiprostone  8 mcg Oral TID WC  . predniSONE  5 mg Oral Q breakfast   Continuous Infusions:  PRN Meds: acetaminophen, ondansetron (ZOFRAN) IV, traMADol   Vital Signs    Vitals:   09/12/18 2353 09/13/18 0554 09/13/18 0846 09/13/18 1100  BP: (!) 155/77 (!) 156/69    Pulse: 60 63 61   Resp:      Temp: 97.8 F (36.6 C) 98 F (36.7 C)    TempSrc: Oral Oral    SpO2: 97% 97%    Weight:  49.6 kg    Height:    5\' 5"  (1.651 m)    Intake/Output Summary (Last 24 hours) at 09/13/2018 1256 Last data filed at 09/13/2018 0900 Gross per 24 hour  Intake 980 ml  Output -  Net 980 ml   Last 3 Weights 09/13/2018 09/12/2018 09/10/2018  Weight (lbs) 109 lb 4.8 oz 110 lb 14.3 oz 111 lb  Weight (kg) 49.578 kg 50.3 kg 50.349 kg      Telemetry    Sinus rhythm. - Personally Reviewed  ECG    n/a - Personally Reviewed  Physical Exam   GEN: No acute distress.  Frail, elderly woman Neck: No JVD Cardiac: RRR, II/VI systolic murmur.  No rubs or gallops.  Respiratory: Clear to auscultation bilaterally. GI: Soft, nontender, non-distended  MS: No edema; No deformity. Neuro:  Nonfocal  Psych: Normal affect   Labs    High Sensitivity Troponin:   Recent Labs  Lab 09/06/18 1638 09/06/18 1836 09/12/18 1413 09/12/18 1656  TROPONINIHS 51* 49* 75* 91*      Chemistry Recent Labs  Lab 09/06/18 1638  09/06/18 1836 09/12/18 1413 09/13/18 0436  NA 133*  --  132* 136  K 4.1  --  4.3 4.2  CL 96*  --  96* 102  CO2 24  --  24 25  GLUCOSE 113*  --  110* 80  BUN 20  --  19 17  CREATININE 1.20*  --  1.25* 1.15*  CALCIUM 9.2  --  9.0 8.4*  PROT  --  5.8* 5.3*  --   ALBUMIN  --  3.7 3.5  --   AST  --  23 21  --   ALT  --  17 12  --   ALKPHOS  --  47 41  --   BILITOT  --  1.2 1.0  --   GFRNONAA 38*  --  36* 40*  GFRAA 44*  --  42* 47*  ANIONGAP 13  --  12 9     Hematology Recent Labs  Lab 09/06/18 1638 09/12/18 1413 09/13/18 0436  WBC 10.6* 11.9* 8.2  RBC 4.19 4.11 3.72*  HGB 14.0 13.7 12.3  HCT 42.7 41.0 36.8  MCV 101.9* 99.8 98.9  MCH 33.4 33.3 33.1  MCHC 32.8 33.4 33.4  RDW 13.2 13.3 13.4  PLT 235 217 203    BNPNo results for input(s): BNP, PROBNP in the last 168 hours.   DDimer No results for input(s): DDIMER in the last 168 hours.   Radiology    Dg Chest Portable 1 View  Result Date: 09/12/2018 CLINICAL DATA:  85 year old female with orthostatic hypotension EXAM: PORTABLE CHEST 1 VIEW COMPARISON:  Prior chest x-ray 09/06/2018 FINDINGS: Stable cardiomegaly. Mediastinal contours are also unchanged. Atherosclerotic calcifications are present in the transverse aorta. No pulmonary edema, pleural effusion or pneumothorax. No acute osseous abnormality. Stable multifocal degenerative changes. IMPRESSION: Stable chest x-ray without acute cardiopulmonary process. Electronically Signed   By: Jacqulynn Cadet M.D.   On: 09/12/2018 14:50    Cardiac Studies   09/10/18: IMPRESSIONS   1. The left ventricle has normal systolic function, with an ejection fraction of 55-60%. The cavity size was normal. There is moderately increased left ventricular wall thickness. Left ventricular diastolic Doppler parameters are consistent with  pseudonormalization. Elevated left ventricular end-diastolic pressure No evidence of left ventricular regional wall motion abnormalities. 2. The  right ventricle has normal systolic function. The cavity was normal. There is no increase in right ventricular wall thickness. Right ventricular systolic pressure could not be assessed. 3. Left atrial size was mildly dilated. 4. The pericardial effusion is localized near the right atrium and anterior to the right ventricle. 5. Trivial pericardial effusion is present. 6. Mild thickening of the mitral valve leaflet. Mild calcification of the mitral valve leaflet. There is moderate mitral annular calcification present. 7. The aortic valve is tricuspid. Moderate sclerosis of the aortic valve. Aortic valve regurgitation is mild by color flow Doppler. 8. The aorta is normal unless otherwise noted. 9. The interatrial septum appears to be lipomatous.  Patient Profile     85 y.o. female with paroxysmal atrial fibrillation, prior PE on anticoagulation and CKD 2 admitted with atrial fibrillation with rapid ventricular response and hypotension.  Assessment & Plan    # Atrial fibrillation with RVR: Ms. Hornstein is here with recurrent atrial fibrillation with rapid ventricular response despite increasing her amiodarone dose to 200 mg daily.  She feels poorly when her heart is racing and she was hypotensive to the 70s to 90s.  She has received a 500 mL bolus with some improvement in her blood pressure.  She was started on digoxin with a significant HR improvement and converted to sinus rhythm soon after.  She is currently feeling well and wants to go home.  She will need a digoxin level checked at follow up.    # UTI: May be contributing to afib.  Will treat with Augmentin x14 days.  She has tolerated this medication in the past.   # Elevated BP:  BP was initially low.  It is now high.  She doesn't have a history of hypertension.  She will track her BP at home and bring her data to follow up.      For questions or updates, please contact [place] Please consult [email or web address] for contact info  under        Signed, Skeet Latch, MD  09/13/2018, 12:56 PM

## 2018-09-14 ENCOUNTER — Telehealth: Payer: Self-pay | Admitting: Interventional Cardiology

## 2018-09-14 NOTE — Telephone Encounter (Signed)
I have recommended discontinuation of digoxin. Increase amiodarone to 400 mg/day, 200 mg twice daily for 7 days, then back to 200 mg/day. If she develops atrial fibrillation she should lie down and try to remain quiet and restful.  Sitting or standing when in atrial fibrillation could be harmful because her blood pressure tends to get low.  If she is unable to tolerate lying down or if atrial fibrillation goes on for hours they should call.  [name] I have already explained this to [name]'s daughter.  We need to remove digoxin from her med list, notate that she will be L4 100 mg daily for 7 days then back to 200 mg/day thereafter

## 2018-09-14 NOTE — Telephone Encounter (Signed)
Spoke with daughter and told her to have pt take Digoxin in the morning.  She states that she read Dig shouldn't be taken with high fiber meals and her mother eats high fiber cereal in the morning.  Advised to wait 2-3 hours after breakfast to take the Digoxin.  Daughter verbalized understanding.

## 2018-09-14 NOTE — Telephone Encounter (Signed)
Pt c/o medication issue:  1. Name of Medication: digoxin 62.5 MCG TABS  2. How are you currently taking this medication (dosage and times per day)? Pt has not started taking it yet  3. Are you having a reaction (difficulty breathing--STAT)? no  4. What is your medication issue? Daughter of pt wants to know when the best time of the day is for the patient to take this medication. The patient also takes amiodarone, and the daughter wants to make sure they are safe to take at the same time

## 2018-09-15 ENCOUNTER — Telehealth: Payer: Self-pay | Admitting: Cardiology

## 2018-09-15 LAB — URINE CULTURE

## 2018-09-15 NOTE — Addendum Note (Signed)
Addended by: Loren Racer on: 09/15/2018 07:12 AM   Modules accepted: Orders

## 2018-09-15 NOTE — Telephone Encounter (Signed)
TOC Patient  Please call Patient  Pt has an appt with Kathyrn Drown on 09-28-18

## 2018-09-15 NOTE — Telephone Encounter (Signed)
Patient contacted regarding discharge from [facility] on 09/13/2018.  Patient understands to follow up with provider Kathyrn Drown on 09/28/2018 at 10:00 am at [facility]. Patient understands her discharge instructions. Patient understands her medications and regimen. Patient understands have all of her medications available for this visit.

## 2018-09-17 DIAGNOSIS — R1312 Dysphagia, oropharyngeal phase: Secondary | ICD-10-CM | POA: Diagnosis not present

## 2018-09-17 DIAGNOSIS — K5900 Constipation, unspecified: Secondary | ICD-10-CM | POA: Diagnosis not present

## 2018-09-18 DIAGNOSIS — H2512 Age-related nuclear cataract, left eye: Secondary | ICD-10-CM | POA: Diagnosis not present

## 2018-09-19 ENCOUNTER — Other Ambulatory Visit: Payer: Medicare Other

## 2018-09-26 NOTE — Progress Notes (Signed)
Virtual Visit via Telephone Note   This visit type was conducted due to national recommendations for restrictions regarding the COVID-19 Pandemic (e.g. social distancing) in an effort to limit this patient's exposure and mitigate transmission in our community.  Due to her co-morbid illnesses, this patient is at least at moderate risk for complications without adequate follow up.  This format is felt to be most appropriate for this patient at this time.  The patient did not have access to video technology/had technical difficulties with video requiring transitioning to audio format only (telephone).  All issues noted in this document were discussed and addressed.  No physical exam could be performed with this format.  Please refer to the patient's chart for her  consent to telehealth for [facility].   Date:  09/28/2018   ID:  [name], DOB 1920/03/01, MRN [id number]  Patient Location: Home Provider Location: Home  PCP:  Antony Contras, MD  Cardiologist:  Sinclair Grooms, MD  Electrophysiologist:  None   Evaluation Performed:  Follow-Up Visit  Chief Complaint:  Hospital follow up, AF, seen for Dr. [name]   History of Present Illness:    [name] is a 85 y.o. female with a hx of paroxysmal atrial fibrillation, prior PE on anticoagulation and CKD stage II.   She was diagnosed with AF approximately 10 years ago. She did well on amiodarone 100 mg daily until approximately 1 week ago prior to hospital admission 09/12/2018.  During that time, she felt weak and reported palpitations.  She was seen at a walk in clinic 09/06/2018 where there was concern for possible ischemia given her EKG.  She was referred to the emergency department where troponin levels were negative and there were no ischemic changes on EKG.  Prior to discharge, she went into atrial fibrillation with rates up to the 130s.  Amiodarone was increased from 100 to 200 mg.  Since her amiodarone increased she has continued to  feel intermittent episodes of palpitations in which her heart would race.  On the morning of hospital admission 09/12/2018 she had another episode in which her heart rate went up and had a near syncopal episode.  Given that she was brought to the ED for further evaluation.    In the ED, she was found to be in atrial fibrillation with RVR with rates in the 150s and was hypotensive with SBP's ranging in the 70s to the 90s.  On telemetry, she was noted to be going in and out of atrial fibrillation and therefore cardioversion was not considered at that time. Digoxin 0.125 mg IV x2 doses followed by oral dosing was initiated.  An echocardiogram was performed 09/10/2018 with normal LVEF of 55 to 60% with no evidence of left ventricular wall motion abnormalities.  By 09/13/2018 her heart rate had improved and she converted to NSR.  Digoxin level was needed at follow-up?  BPs stabilized and patient was discharged on 09/13/2018.  On 09/14/2018 telephone note states that Dr. [name] recommended discontinuation of digoxin.  Increase amiodarone to 400 mg/day for 7 days then back to 200 mg/day thereafter.  He also recommended if she developed atrial fibrillation she should lie down and remain quiet restful as standing while in atrial fibrillation could be harmful as her BP gets low. Digoxin was then removed from her medication list.  Today she presents for follow-up with her daughter's assistance for telephone visit.  Both report that her heart rate has been very stable since the  increase of amiodarone to 200 mg daily (completed 400 mg daily x7 days) with no recurrent palpitations or fatigue.  Daughter states BP has been very well controlled with morning BP readings in the 100/50s in the afternoon readings in the 130/40 to 60s.  She has had no recurrent dizziness or presyncopal sensations.  She denies chest pain, shortness of breath, LE swelling.   Daughter reports one episode in which the patient's heart rate was noted to be  in the upper 40s per their machine.  Patient was asymptomatic.  Daughter ambulated the patient to her bedroom and rechecked the patient's heart rate that had stabilized to the mid 60s.  We discussed that likely this is an incidental finding that she should continue to keep close eyes and if this continues to trend we would need to know.  Heart rate stable today at 67 bpm  The patient does not have symptoms concerning for COVID-19 infection (fever, chills, cough, or new shortness of breath).    Past Medical History:  Diagnosis Date   Blindness    Blood clot associated with vein wall inflammation    CKD (chronic kidney disease), stage III (HCC)    Colitis    Goiter    s/p radioactive iod. tx   Hypothyroidism    On amiodarone therapy    Paroxysmal atrial fibrillation (HCC)    Pulmonary embolus (HCC)    and DVT s/p IVC filter   SBO (small bowel obstruction) (HCC)    Spinal stenosis of lumbar region with radiculopathy    possible neurogenic claudication recently?   Temporal arteritis [facility])    Past Surgical History:  Procedure Laterality Date   APPENDECTOMY  1940   BACK SURGERY  04/1998   spinal stenosis   BACK SURGERY  2000   Ruptured Disc   BREAST BIOPSY     cysts in both breasts   CARPAL TUNNEL RELEASE  2002   ivc filter     PARTIAL HYSTERECTOMY     TONSILLECTOMY  1934   TOTAL KNEE ARTHROPLASTY Left 04/1999   TOTAL KNEE ARTHROPLASTY Right 02/2000     Current Meds  Medication Sig   acetaminophen (TYLENOL) 500 MG tablet Take 500-1,000 mg by mouth See admin instructions. Take 500mg  in the morning, 500mg  midday and 1000mg  at bedtime.   amiodarone (PACERONE) 200 MG tablet Take 1 tablet (200 mg total) by mouth daily.   apixaban (ELIQUIS) 2.5 MG TABS tablet Take 1 tablet (2.5 mg total) by mouth 2 (two) times daily.   Calcium Carbonate-Vitamin D (CALCIUM HIGH POTENCY/VITAMIN D) 600-200 MG-UNIT TABS Take 1 tablet by mouth daily.    Camphor-Menthol-Methyl Sal  (SALONPAS) 3.01-20-08 % PTCH Place 1 patch onto the skin at bedtime as needed (pain).   CRANBERRY EXTRACT PO Take 2 tablets by mouth 2 (two) times daily.    gabapentin (NEURONTIN) 100 MG capsule Take 100-200 mg by mouth 4 (four) times daily.    levothyroxine (SYNTHROID, LEVOTHROID) 150 MCG tablet Take 150 mcg by mouth daily before breakfast.   lubiprostone (AMITIZA) 8 MCG capsule Take 8 mcg by mouth 3 (three) times daily with meals.    Misc Natural Products (GLUCOSAMINE CHONDROITIN COMPLX) TABS Take 1 tablet by mouth 2 (two) times daily.    Multiple Vitamins-Minerals (OCUVITE ADULT 50+ PO) Take 1 tablet by mouth daily.   nitrofurantoin (MACRODANTIN) 50 MG capsule Take 50 mg by mouth daily.   nitroGLYCERIN (NITROSTAT) 0.4 MG SL tablet Place 1 tablet (0.4 mg total) under the tongue  every 5 (five) minutes as needed for chest pain.   predniSONE (DELTASONE) 5 MG tablet Take 5 mg by mouth daily.    Probiotic Product (ULTRAFLORA IMMUNE HEALTH PO) Take 1 tablet by mouth daily.    traMADol (ULTRAM) 50 MG tablet Take 50 mg by mouth daily as needed for moderate pain.    trolamine salicylate (ASPERCREME) 10 % cream Apply 1 application topically as needed for muscle pain.     Allergies:   Hydrocortisone, Sulfa antibiotics, Cefdinir, Cortisone, Prednisone, Sulfasalazine, and Sulfonamide derivatives   Social History   Tobacco Use   Smoking status: Never Smoker   Smokeless tobacco: Never Used  Substance Use Topics   Alcohol use: No   Drug use: No     Family Hx: The patient's family history includes Breast cancer in her sister; Congestive Heart Failure in her mother; Stroke in her father. There is no history of Crohn's disease or Ulcerative colitis.  ROS:   Please see the history of present illness.     All other systems reviewed and are negative.  Prior CV studies:   The following studies were reviewed today:  Echocardiogram 09/10/2018:  1. The left ventricle has normal systolic  function, with an ejection fraction of 55-60%. The cavity size was normal. There is moderately increased left ventricular wall thickness. Left ventricular diastolic Doppler parameters are consistent with  pseudonormalization. Elevated left ventricular end-diastolic pressure No evidence of left ventricular regional wall motion abnormalities.  2. The right ventricle has normal systolic function. The cavity was normal. There is no increase in right ventricular wall thickness. Right ventricular systolic pressure could not be assessed.  3. Left atrial size was mildly dilated.  4. The pericardial effusion is localized near the right atrium and anterior to the right ventricle.  5. Trivial pericardial effusion is present.  6. Mild thickening of the mitral valve leaflet. Mild calcification of the mitral valve leaflet. There is moderate mitral annular calcification present.  7. The aortic valve is tricuspid. Moderate sclerosis of the aortic valve. Aortic valve regurgitation is mild by color flow Doppler.  8. The aorta is normal unless otherwise noted.  9. The interatrial septum appears to be lipomatous.   Labs/Other Tests and Data Reviewed:    EKG:  An ECG dated 09/12/2018 was personally reviewed today and demonstrated:  SVT, HR 147  Recent Labs: 09/12/2018: ALT 12; Magnesium 2.0; TSH 2.264 09/13/2018: BUN 17; Creatinine, Ser 1.15; Hemoglobin 12.3; Platelets 203; Potassium 4.2; Sodium 136   Recent Lipid Panel No results found for: CHOL, TRIG, HDL, CHOLHDL, LDLCALC, LDLDIRECT  Wt Readings from Last 3 Encounters:  09/28/18 112 lb (50.8 kg)  09/13/18 109 lb 4.8 oz (49.6 kg)  09/10/18 111 lb (50.3 kg)     Objective:    Vital Signs:  BP 139/64    Pulse 67    Ht 5\' 5"  (1.651 m)    Wt 112 lb (50.8 kg)    BMI 18.64 kg/m   VITAL SIGNS:  reviewed RESPIRATORY:  normal respiratory effort, symmetric expansion NEURO:  alert and oriented x 3, no obvious focal deficit PSYCH:  normal affect  ASSESSMENT &  PLAN:    1.  Atrial fibrillation with RVR: -Recent hospitalization as described above -Currently on amiodarone 200 mg daily>>HR more stable, no reports of palpitations. Completed a loading dose of 400mg  QD x 7 days -BP stable, 139/64, HR 67 bpm -Continued on anticoagulation with low-dose Eliquis -No acute bleeding in stool or urine -Digoxin discontinued per Dr.  Smith   2.  Hypertension: -Initially hypotensive on hospital presentation however with resolution after IV fluids and medication changes with amiodarone -Patient to track BP for follow-up>> more stable with a.m. BPs in the 100 over 50s and afternoon BPs in the 130 over 40s to 60s -Continue current regimen   COVID-19 Education: The signs and symptoms of COVID-19 were discussed with the patient and how to seek care for testing (follow up with PCP or arrange E-visit).  The importance of social distancing was discussed today.  Time:   Today, I have spent 20 minutes with the patient with telehealth technology discussing the above problems.     Medication Adjustments/Labs and Tests Ordered: Current medicines are reviewed at length with the patient today.  Concerns regarding medicines are outlined above.   Tests Ordered: No orders of the defined types were placed in this encounter.   Medication Changes: No orders of the defined types were placed in this encounter.   Follow Up:  In Person Dr. [name] 10/13/2018  Signed, Kathyrn Drown, NP  09/28/2018 10:14 AM    [place]

## 2018-09-28 ENCOUNTER — Encounter: Payer: Self-pay | Admitting: Cardiology

## 2018-09-28 ENCOUNTER — Telehealth (INDEPENDENT_AMBULATORY_CARE_PROVIDER_SITE_OTHER): Payer: Medicare Other | Admitting: Cardiology

## 2018-09-28 ENCOUNTER — Other Ambulatory Visit: Payer: Self-pay

## 2018-09-28 VITALS — BP 139/64 | HR 67 | Ht 65.0 in | Wt 112.0 lb

## 2018-09-28 DIAGNOSIS — I4891 Unspecified atrial fibrillation: Secondary | ICD-10-CM | POA: Diagnosis not present

## 2018-09-28 DIAGNOSIS — Z5181 Encounter for therapeutic drug level monitoring: Secondary | ICD-10-CM

## 2018-09-28 DIAGNOSIS — Z79899 Other long term (current) drug therapy: Secondary | ICD-10-CM

## 2018-09-28 NOTE — Patient Instructions (Signed)
Medication Instructions:  Your physician recommends that you continue on your current medications as directed. Please refer to the Current Medication list given to you today.  If you need a refill on your cardiac medications before your next appointment, please call your pharmacy.   Lab work: None   If you have labs (blood work) drawn today and your tests are completely normal, you will receive your results only by: Marland Kitchen MyChart Message (if you have MyChart) OR . A paper copy in the mail If you have any lab test that is abnormal or we need to change your treatment, we will call you to review the results.  Testing/Procedures: None   Follow-Up: You are scheduled to see Dr. [name] on 10/13/2018 @ 3:00 PM  Any Other Special Instructions Will Be Listed Below (If Applicable).

## 2018-09-30 ENCOUNTER — Encounter: Payer: Self-pay | Admitting: Podiatry

## 2018-09-30 ENCOUNTER — Other Ambulatory Visit: Payer: Self-pay

## 2018-09-30 ENCOUNTER — Ambulatory Visit (INDEPENDENT_AMBULATORY_CARE_PROVIDER_SITE_OTHER): Payer: Medicare Other | Admitting: Podiatry

## 2018-09-30 DIAGNOSIS — B351 Tinea unguium: Secondary | ICD-10-CM

## 2018-09-30 DIAGNOSIS — D689 Coagulation defect, unspecified: Secondary | ICD-10-CM

## 2018-09-30 DIAGNOSIS — M79676 Pain in unspecified toe(s): Secondary | ICD-10-CM | POA: Diagnosis not present

## 2018-09-30 NOTE — Progress Notes (Signed)
Patient ID: [name], female   DOB: 03-Apr-1920, 85 y.o.   MRN: [id number] Complaint:  Visit Type: Patient returns to my office for continued preventative foot care services. Complaint: Patient states" my nails have grown long and thick and become painful to walk and wear shoes" . The patient presents for preventative foot care services. No changes to ROS.    Podiatric Exam: Vascular: dorsalis pedis and posterior tibial pulses are palpable bilateral. Capillary return is immediate. Temperature gradient is WNL. Skin turgor WNL  Sensorium: Diminished  Semmes Weinstein monofilament test. Normal tactile sensation bilaterally. Nail Exam: Pt has thick disfigured discolored nails with subungual debris noted bilateral entire nail hallux through fifth toenails Ulcer Exam: There is no evidence of ulcer or pre-ulcerative changes or infection. Orthopedic Exam: Muscle tone and strength are WNL. No limitations in general ROM. No crepitus or effusions noted. Foot type and digits show no abnormalities. Bony prominences are unremarkable.  Skin: No Porokeratosis. No infection or ulcers  Diagnosis:  Onychomycosis, , Pain in right toe, pain in left toes  Treatment & Plan Procedures and Treatment: Consent by patient was obtained for treatment procedures. The patient understood the discussion of treatment and procedures well. All questions were answered thoroughly reviewed. Debridement of mycotic and hypertrophic toenails, 1 through 5 bilateral and clearing of subungual debris. No ulceration, no infection noted.   Return Visit-Office Procedure: Patient instructed to return to the office for a follow up visit 3 months for continued evaluation and treatment.   Gardiner Barefoot DPM

## 2018-10-13 ENCOUNTER — Ambulatory Visit: Payer: Medicare Other | Admitting: Interventional Cardiology

## 2018-10-19 NOTE — Progress Notes (Signed)
Cardiology Office Note:    Date:  10/20/2018   ID:  NATIA FAHMY, DOB [birth year], MRN [id number]  PCP:  Antony Contras, MD  Cardiologist:  Sinclair Grooms, MD   Referring MD: Antony Contras, MD   Chief Complaint  Patient presents with  . Atrial Fibrillation  . Coronary Artery Disease    History of Present Illness:    [name] is a 85 y.o. female with a hx of paroxysmal atrial fibrillation, chronic amiodarone therapy, history of PE on chronic anticoagulation with Eliquis 2.5 mg twice daily and CKD stage II.  She is accompanied by her other daughter today.  She is doing well.  Since increasing amiodarone to 200 mg/day, episodes of atrial fibrillation have been less frequent.  She does have some dyspnea for the first 10 to 15 minutes after lying down.  I explained this is probably related to diastolic dysfunction and transient elevation in pulmonary pressures related to assuming the supine position.  She is not having angina.  She has not had syncope.  Past Medical History:  Diagnosis Date  . Blindness   . Blood clot associated with vein wall inflammation   . CKD (chronic kidney disease), stage III   . Colitis   . Goiter    s/p radioactive iod. tx  . Hypothyroidism   . On amiodarone therapy   . Paroxysmal atrial fibrillation (HCC)   . Pulmonary embolus (HCC)    and DVT s/p IVC filter  . SBO (small bowel obstruction) ([place])   . Spinal stenosis of lumbar region with radiculopathy    possible neurogenic claudication recently?  . Temporal arteritis [facility])     Past Surgical History:  Procedure Laterality Date  . APPENDECTOMY  1940  . BACK SURGERY  04/1998   spinal stenosis  . BACK SURGERY  2000   Ruptured Disc  . BREAST BIOPSY     cysts in both breasts  . CARPAL TUNNEL RELEASE  2002  . ivc filter    . PARTIAL HYSTERECTOMY    . TONSILLECTOMY  1934  . TOTAL KNEE ARTHROPLASTY Left 04/1999  . TOTAL KNEE ARTHROPLASTY Right 02/2000    Current Medications: Current Meds   Medication Sig  . acetaminophen (TYLENOL) 500 MG tablet Take 500-1,000 mg by mouth See admin instructions. Take 500mg  in the morning, 500mg  midday and 1000mg  at bedtime.  Marland Kitchen amiodarone (PACERONE) 200 MG tablet Take 1 tablet (200 mg total) by mouth daily.  Marland Kitchen apixaban (ELIQUIS) 2.5 MG TABS tablet Take 1 tablet (2.5 mg total) by mouth 2 (two) times daily.  . Calcium Carbonate-Vitamin D (CALCIUM HIGH POTENCY/VITAMIN D) 600-200 MG-UNIT TABS Take 1 tablet by mouth daily.   Marland Kitchen CRANBERRY EXTRACT PO Take 2 tablets by mouth 2 (two) times daily.   Marland Kitchen gabapentin (NEURONTIN) 100 MG capsule Take 100-200 mg by mouth 4 (four) times daily.   Marland Kitchen levothyroxine (SYNTHROID, LEVOTHROID) 150 MCG tablet Take 150 mcg by mouth daily before breakfast.  . lubiprostone (AMITIZA) 8 MCG capsule Take 8 mcg by mouth 3 (three) times daily with meals.   . Misc Natural Products (GLUCOSAMINE CHONDROITIN COMPLX) TABS Take 1 tablet by mouth 2 (two) times daily.   . Multiple Vitamins-Minerals (OCUVITE ADULT 50+ PO) Take 1 tablet by mouth daily.  . nitrofurantoin (MACRODANTIN) 50 MG capsule Take 50 mg by mouth daily.  . nitroGLYCERIN (NITROSTAT) 0.4 MG SL tablet Place 1 tablet (0.4 mg total) under the tongue every 5 (five) minutes as needed for chest  pain.  . predniSONE (DELTASONE) 5 MG tablet Take 5 mg by mouth daily.   . Probiotic Product (ULTRAFLORA IMMUNE HEALTH PO) Take 1 tablet by mouth daily.   . traMADol (ULTRAM) 50 MG tablet Take 50 mg by mouth daily as needed for moderate pain.   Marland Kitchen trolamine salicylate (ASPERCREME) 10 % cream Apply 1 application topically as needed for muscle pain.     Allergies:   Hydrocortisone, Sulfa antibiotics, Cefdinir, Cortisone, Prednisone, Sulfasalazine, and Sulfonamide derivatives   Social History   Socioeconomic History  . Marital status: Widowed    Spouse name: Not on file  . Number of children: 4  . Years of education: College  . Highest education level: Not on file  Occupational History     Employer: RETIRED    Comment: retired  Scientific laboratory technician  . Financial resource strain: Not on file  . Food insecurity    Worry: Not on file    Inability: Not on file  . Transportation needs    Medical: Not on file    Non-medical: Not on file  Tobacco Use  . Smoking status: Never Smoker  . Smokeless tobacco: Never Used  Substance and Sexual Activity  . Alcohol use: No  . Drug use: No  . Sexual activity: Never  Lifestyle  . Physical activity    Days per week: Not on file    Minutes per session: Not on file  . Stress: Not on file  Relationships  . Social Herbalist on phone: Not on file    Gets together: Not on file    Attends religious service: Not on file    Active member of club or organization: Not on file    Attends meetings of clubs or organizations: Not on file    Relationship status: Not on file  Other Topics Concern  . Not on file  Social History Narrative   Patient lives at home alone.  Use walker to get around   Caffeine Use: rarely   Patient is right handed     Family History: The patient's family history includes Breast cancer in her sister; Congestive Heart Failure in her mother; Stroke in her father. There is no history of Crohn's disease or Ulcerative colitis.  ROS:   Please see the history of present illness.    Chronic back discomfort unchanged.  Family was concerned about the possibility of incorrect dose on Eliquis.  Chronic neck pain, unchanged.  All other systems reviewed and are negative.  EKGs/Labs/Other Studies Reviewed:    The following studies were reviewed today: No new data  EKG:  EKG not repeated.  Recent Labs: 09/12/2018: ALT 12; Magnesium 2.0; TSH 2.264 09/13/2018: BUN 17; Creatinine, Ser 1.15; Hemoglobin 12.3; Platelets 203; Potassium 4.2; Sodium 136  Recent Lipid Panel No results found for: CHOL, TRIG, HDL, CHOLHDL, VLDL, LDLCALC, LDLDIRECT  Physical Exam:    VS:  BP (!) 142/62   Pulse (!) 59   Ht 5\' 5"  (1.651 m)   Wt  111 lb (50.3 kg)   SpO2 98%   BMI 18.47 kg/m     Wt Readings from Last 3 Encounters:  10/20/18 111 lb (50.3 kg)  09/28/18 112 lb (50.8 kg)  09/13/18 109 lb 4.8 oz (49.6 kg)     GEN: Elderly and slight relative to weight.. No acute distress HEENT: Normal NECK: No JVD. LYMPHATICS: No lymphadenopathy CARDIAC:  RRR without murmur, gallop, or edema. VASCULAR:  Normal Pulses. No bruits. RESPIRATORY:  Clear  to auscultation without rales, wheezing or rhonchi  ABDOMEN: Soft, non-tender, non-distended, No pulsatile mass, MUSCULOSKELETAL: No deformity  SKIN: Warm and dry NEUROLOGIC:  Alert and oriented x 3 PSYCHIATRIC:  Normal affect   ASSESSMENT:    1. Paroxysmal atrial fibrillation (HCC)   2. CKD (chronic kidney disease), stage IV ([place])   3. Angina pectoris ([place])   4. Anticoagulated on warfarin   5. Chronic diastolic heart failure ([place])   6. Educated about COVID-19 virus infection    PLAN:    In order of problems listed above:  1. Currently in sinus rhythm based on clinical assessment.  EKG is not repeated. 2. Kidney function most recently was mildly abnormal with creatinine 1.15.  She weighs only 111 pounds. 3. She has not had angina 4. She is on Eliquis, lower dose because she is greater than 41 years of age and her weight is less than 133 pounds.  This was explained to the patient's family. 5. Orthopnea when first lying flat.  Otherwise no complaint of dyspnea.  Orthopnea improves after 10 minutes or so of lying.  She does not awaken short of breath. 6. Social distancing, mask wearing, and handwashing is encouraged.   Medication Adjustments/Labs and Tests Ordered: Current medicines are reviewed at length with the patient today.  Concerns regarding medicines are outlined above.  No orders of the defined types were placed in this encounter.  No orders of the defined types were placed in this encounter.   There are no Patient Instructions on file for this visit.   Signed,  Sinclair Grooms, MD  10/20/2018 4:22 PM    [place]

## 2018-10-20 ENCOUNTER — Ambulatory Visit (INDEPENDENT_AMBULATORY_CARE_PROVIDER_SITE_OTHER): Payer: Medicare Other | Admitting: Interventional Cardiology

## 2018-10-20 ENCOUNTER — Encounter: Payer: Self-pay | Admitting: Interventional Cardiology

## 2018-10-20 ENCOUNTER — Other Ambulatory Visit: Payer: Self-pay

## 2018-10-20 VITALS — BP 142/62 | HR 59 | Ht 65.0 in | Wt 111.0 lb

## 2018-10-20 DIAGNOSIS — I209 Angina pectoris, unspecified: Secondary | ICD-10-CM | POA: Diagnosis not present

## 2018-10-20 DIAGNOSIS — I5032 Chronic diastolic (congestive) heart failure: Secondary | ICD-10-CM

## 2018-10-20 DIAGNOSIS — Z7189 Other specified counseling: Secondary | ICD-10-CM

## 2018-10-20 DIAGNOSIS — I480 Paroxysmal atrial fibrillation: Secondary | ICD-10-CM | POA: Diagnosis not present

## 2018-10-20 DIAGNOSIS — Z7901 Long term (current) use of anticoagulants: Secondary | ICD-10-CM | POA: Diagnosis not present

## 2018-10-20 DIAGNOSIS — N184 Chronic kidney disease, stage 4 (severe): Secondary | ICD-10-CM

## 2018-10-20 NOTE — Patient Instructions (Signed)

## 2018-10-21 DIAGNOSIS — Z23 Encounter for immunization: Secondary | ICD-10-CM | POA: Diagnosis not present

## 2018-11-06 DIAGNOSIS — R519 Headache, unspecified: Secondary | ICD-10-CM | POA: Diagnosis not present

## 2018-11-06 DIAGNOSIS — R42 Dizziness and giddiness: Secondary | ICD-10-CM | POA: Diagnosis not present

## 2018-11-06 DIAGNOSIS — M26621 Arthralgia of right temporomandibular joint: Secondary | ICD-10-CM | POA: Diagnosis not present

## 2018-11-11 ENCOUNTER — Telehealth: Payer: Self-pay | Admitting: Interventional Cardiology

## 2018-11-11 NOTE — Telephone Encounter (Signed)
I spoke to the patient's daughter Mardene Celeste) who is calling because on Saturday the patient experienced low HR 40s and BP (not documented).  She also had difficulty taking in a "deep breath."  Since then she has felt fine with no recurring episodes, but I told her that if symptoms return, to call our office.

## 2018-11-11 NOTE — Telephone Encounter (Signed)
Patient's daughter states that her mom has some caregivers that help out, she wants to know if they should have their flu shots? Also, she states that [place] she had an unusual day, patient kept having to take deep breaths and had low HR and BP, states the patient was not SOB. She has been fine since then. Please advise.

## 2018-11-27 DIAGNOSIS — R5381 Other malaise: Secondary | ICD-10-CM | POA: Diagnosis not present

## 2018-11-27 DIAGNOSIS — I509 Heart failure, unspecified: Secondary | ICD-10-CM | POA: Diagnosis not present

## 2018-11-27 DIAGNOSIS — I4891 Unspecified atrial fibrillation: Secondary | ICD-10-CM | POA: Diagnosis not present

## 2018-12-04 DIAGNOSIS — M25511 Pain in right shoulder: Secondary | ICD-10-CM | POA: Diagnosis not present

## 2018-12-04 DIAGNOSIS — M5136 Other intervertebral disc degeneration, lumbar region: Secondary | ICD-10-CM | POA: Diagnosis not present

## 2018-12-08 ENCOUNTER — Ambulatory Visit: Payer: Medicare Other | Admitting: Podiatry

## 2018-12-18 DIAGNOSIS — M25512 Pain in left shoulder: Secondary | ICD-10-CM | POA: Diagnosis not present

## 2018-12-23 ENCOUNTER — Other Ambulatory Visit: Payer: Self-pay

## 2018-12-23 ENCOUNTER — Ambulatory Visit (INDEPENDENT_AMBULATORY_CARE_PROVIDER_SITE_OTHER): Payer: Medicare Other | Admitting: Podiatry

## 2018-12-23 ENCOUNTER — Encounter: Payer: Self-pay | Admitting: Podiatry

## 2018-12-23 DIAGNOSIS — M79676 Pain in unspecified toe(s): Secondary | ICD-10-CM

## 2018-12-23 DIAGNOSIS — D689 Coagulation defect, unspecified: Secondary | ICD-10-CM

## 2018-12-23 DIAGNOSIS — B351 Tinea unguium: Secondary | ICD-10-CM | POA: Diagnosis not present

## 2018-12-23 DIAGNOSIS — Z7901 Long term (current) use of anticoagulants: Secondary | ICD-10-CM

## 2018-12-23 NOTE — Progress Notes (Signed)
Patient ID: [name], female   DOB: Oct 03, 1920, 85 y.o.   MRN: [id number] Complaint:  Visit Type: Patient returns to my office for continued preventative foot care services. Complaint: Patient states" my nails have grown long and thick and become painful to walk and wear shoes" . The patient presents for preventative foot care services. No changes to ROS.    Podiatric Exam: Vascular: dorsalis pedis and posterior tibial pulses are palpable bilateral. Capillary return is immediate. Temperature gradient is WNL. Skin turgor WNL  Sensorium: Diminished  Semmes Weinstein monofilament test. Normal tactile sensation bilaterally. Nail Exam: Pt has thick disfigured discolored nails with subungual debris noted bilateral entire nail hallux through fifth toenails Ulcer Exam: There is no evidence of ulcer or pre-ulcerative changes or infection. Orthopedic Exam: Muscle tone and strength are WNL. No limitations in general ROM. No crepitus or effusions noted. Foot type and digits show no abnormalities. Bony prominences are unremarkable.  Skin: No Porokeratosis. No infection or ulcers  Diagnosis:  Onychomycosis, , Pain in right toe, pain in left toes  Treatment & Plan Procedures and Treatment: Consent by patient was obtained for treatment procedures. The patient understood the discussion of treatment and procedures well. All questions were answered thoroughly reviewed. Debridement of mycotic and hypertrophic toenails, 1 through 5 bilateral and clearing of subungual debris. No ulceration, no infection noted.   Return Visit-Office Procedure: Patient instructed to return to the office for a follow up visit 3 months for continued evaluation and treatment.   Gardiner Barefoot DPM

## 2019-02-12 DIAGNOSIS — I4891 Unspecified atrial fibrillation: Secondary | ICD-10-CM | POA: Diagnosis not present

## 2019-02-12 DIAGNOSIS — R42 Dizziness and giddiness: Secondary | ICD-10-CM | POA: Diagnosis not present

## 2019-02-12 DIAGNOSIS — M5136 Other intervertebral disc degeneration, lumbar region: Secondary | ICD-10-CM | POA: Diagnosis not present

## 2019-02-12 DIAGNOSIS — M26621 Arthralgia of right temporomandibular joint: Secondary | ICD-10-CM | POA: Diagnosis not present

## 2019-02-21 ENCOUNTER — Telehealth: Payer: Self-pay | Admitting: Nurse Practitioner

## 2019-02-21 NOTE — Telephone Encounter (Signed)
Thanks and agree 

## 2019-02-21 NOTE — Telephone Encounter (Signed)
   Pts family member called this AM to report that pt had nausea and vomiting earlier this week w/ return of nausea last night, which has persisted this AM.  She feels very weak and BP was 66/35 followed by 76/39 on home cuff.  She did take her morning meds today, which includes amiodarone 200mg , but is not otw on antihypertensives.  She has not had recent fevers/chills/sick contacts.  I rec that family call EMS as pts BP is critically low and she will need emergency management and further w/u immediately.    Caller verbalized understanding and was grateful for the call back.  She will discuss w/ mother, who was wishing to avoid the ED, and call EMS.  Murray Hodgkins, NP 02/21/2019, 10:50 AM

## 2019-02-22 NOTE — Telephone Encounter (Signed)
Did they happen to get any HR recordings during the slow heart rate? Subsequent pressures are okay.

## 2019-02-22 NOTE — Telephone Encounter (Signed)
Patient's daughter calling back to update on the patient. She states the patient's BP was as low as 57/32, but when they were about to call 911 her BP went up to 156/72 and the rest of the day it was high. She would like a call back for advise.

## 2019-02-22 NOTE — Telephone Encounter (Signed)
Spoke with daughter, Mardene Celeste and she said HRs were 49-56 when BPs were very low.  BPs ranged from 57-76/32-77.  Pulse was steady when BPs were low.  Advised I would update Dr. [name] and would call back with any recommendations.  Daughter appreciative for call.

## 2019-02-22 NOTE — Telephone Encounter (Signed)
Yesterday pt was sitting in her chair and c/o dizziness and nausea.  Daughter states pt was hard arouse. Daughter checked BP and it was 57/32 She had pt lay down and elevate feet. Checked it about 35 mins later and it was 76/39.  Daughter called the on call and was told to call 911.  She spoke with pt and pt did not want to proceed to ER.  Daughter had pt sit on the side of the bed and recheck BP and it was 156/72.  They decided to wait and see what happened.  BP at 6pm was 162/77.  BP Saturday was 161/77. Advised I will send to Dr. [name] for review and advisement.

## 2019-02-23 NOTE — Telephone Encounter (Signed)
Watchful waiting without ant particular testing. Let us know of recurrences.

## 2019-02-24 ENCOUNTER — Encounter: Payer: Self-pay | Admitting: Podiatry

## 2019-02-24 ENCOUNTER — Other Ambulatory Visit: Payer: Self-pay

## 2019-02-24 ENCOUNTER — Ambulatory Visit (INDEPENDENT_AMBULATORY_CARE_PROVIDER_SITE_OTHER): Payer: Medicare Other | Admitting: Podiatry

## 2019-02-24 DIAGNOSIS — M79676 Pain in unspecified toe(s): Secondary | ICD-10-CM | POA: Diagnosis not present

## 2019-02-24 DIAGNOSIS — D689 Coagulation defect, unspecified: Secondary | ICD-10-CM | POA: Diagnosis not present

## 2019-02-24 DIAGNOSIS — B351 Tinea unguium: Secondary | ICD-10-CM | POA: Diagnosis not present

## 2019-02-24 NOTE — Progress Notes (Addendum)
Patient ID: ASA FATH, female   DOB: January 02, 1921, 86 y.o.   MRN: [id number] Complaint:  Visit Type: Patient returns to my office for continued preventative foot care services. Complaint: Patient states" my nails have grown long and thick and become painful to walk and wear shoes" . The patient presents for preventative foot care services. No changes to ROS.    Podiatric Exam: Vascular: dorsalis pedis and posterior tibial pulses are palpable bilateral. Capillary return is immediate. Cold feet.  . Skin turgor WNL Purplish discoloration  B/L. Sensorium: Diminished  Semmes Weinstein monofilament test. Normal tactile sensation bilaterally. Nail Exam: Pt has thick disfigured discolored nails with subungual debris noted bilateral entire nail hallux through fifth toenails.  Nail growing into lateral border right hallux.  No infection noted. Ulcer Exam: There is no evidence of ulcer or pre-ulcerative changes or infection. Orthopedic Exam: Muscle tone and strength are WNL. No limitations in general ROM. No crepitus or effusions noted. Foot type and digits show no abnormalities. Bony prominences are unremarkable.  Skin: No Porokeratosis. No infection or ulcers  Diagnosis:  Onychomycosis, , Pain in right toe, pain in left toes  Treatment & Plan Procedures and Treatment: . The patient understood the discussion of treatment and procedures well. All questions were answered thoroughly reviewed. Debridement of mycotic and hypertrophic toenails, 1 through 5 bilateral and clearing of subungual debris. No ulceration, no infection noted.  Neosporin/DSD lateral border right hallux. Return Visit-Office Procedure: Patient instructed to return to the office for a follow up visit 9 weeks  for continued evaluation and treatment.   Gardiner Barefoot DPM

## 2019-02-24 NOTE — Telephone Encounter (Signed)
Spoke with daugher, Mardene Celeste, and made her aware of recommendations.  Mardene Celeste appreciative for call.

## 2019-03-09 DIAGNOSIS — L218 Other seborrheic dermatitis: Secondary | ICD-10-CM | POA: Diagnosis not present

## 2019-03-09 DIAGNOSIS — L821 Other seborrheic keratosis: Secondary | ICD-10-CM | POA: Diagnosis not present

## 2019-03-09 DIAGNOSIS — L298 Other pruritus: Secondary | ICD-10-CM | POA: Diagnosis not present

## 2019-03-09 DIAGNOSIS — L98499 Non-pressure chronic ulcer of skin of other sites with unspecified severity: Secondary | ICD-10-CM | POA: Diagnosis not present

## 2019-03-11 DIAGNOSIS — I4891 Unspecified atrial fibrillation: Secondary | ICD-10-CM | POA: Diagnosis not present

## 2019-03-11 DIAGNOSIS — M069 Rheumatoid arthritis, unspecified: Secondary | ICD-10-CM | POA: Diagnosis not present

## 2019-03-11 DIAGNOSIS — E46 Unspecified protein-calorie malnutrition: Secondary | ICD-10-CM | POA: Diagnosis not present

## 2019-03-11 DIAGNOSIS — I509 Heart failure, unspecified: Secondary | ICD-10-CM | POA: Diagnosis not present

## 2019-03-11 DIAGNOSIS — E039 Hypothyroidism, unspecified: Secondary | ICD-10-CM | POA: Diagnosis not present

## 2019-03-11 DIAGNOSIS — M5432 Sciatica, left side: Secondary | ICD-10-CM | POA: Diagnosis not present

## 2019-03-11 DIAGNOSIS — S46812A Strain of other muscles, fascia and tendons at shoulder and upper arm level, left arm, initial encounter: Secondary | ICD-10-CM | POA: Diagnosis not present

## 2019-03-11 DIAGNOSIS — Z87440 Personal history of urinary (tract) infections: Secondary | ICD-10-CM | POA: Diagnosis not present

## 2019-03-11 DIAGNOSIS — K529 Noninfective gastroenteritis and colitis, unspecified: Secondary | ICD-10-CM | POA: Diagnosis not present

## 2019-03-11 DIAGNOSIS — K5900 Constipation, unspecified: Secondary | ICD-10-CM | POA: Diagnosis not present

## 2019-03-11 DIAGNOSIS — N1831 Chronic kidney disease, stage 3a: Secondary | ICD-10-CM | POA: Diagnosis not present

## 2019-03-11 DIAGNOSIS — M7591 Shoulder lesion, unspecified, right shoulder: Secondary | ICD-10-CM | POA: Diagnosis not present

## 2019-03-11 DIAGNOSIS — E782 Mixed hyperlipidemia: Secondary | ICD-10-CM | POA: Diagnosis not present

## 2019-03-14 ENCOUNTER — Emergency Department (HOSPITAL_COMMUNITY): Payer: Medicare Other

## 2019-03-14 ENCOUNTER — Encounter (HOSPITAL_COMMUNITY): Payer: Self-pay

## 2019-03-14 ENCOUNTER — Other Ambulatory Visit: Payer: Self-pay

## 2019-03-14 ENCOUNTER — Inpatient Hospital Stay (HOSPITAL_COMMUNITY)
Admission: EM | Admit: 2019-03-14 | Discharge: 2019-03-17 | DRG: 388 | Disposition: A | Discharge status: Home Health | Payer: Medicare Other | Attending: Internal Medicine | Admitting: Internal Medicine

## 2019-03-14 DIAGNOSIS — Z96653 Presence of artificial knee joint, bilateral: Secondary | ICD-10-CM | POA: Diagnosis present

## 2019-03-14 DIAGNOSIS — Z888 Allergy status to other drugs, medicaments and biological substances status: Secondary | ICD-10-CM

## 2019-03-14 DIAGNOSIS — Z823 Family history of stroke: Secondary | ICD-10-CM | POA: Diagnosis not present

## 2019-03-14 DIAGNOSIS — I480 Paroxysmal atrial fibrillation: Secondary | ICD-10-CM | POA: Diagnosis present

## 2019-03-14 DIAGNOSIS — R110 Nausea: Secondary | ICD-10-CM | POA: Diagnosis not present

## 2019-03-14 DIAGNOSIS — N184 Chronic kidney disease, stage 4 (severe): Secondary | ICD-10-CM | POA: Diagnosis present

## 2019-03-14 DIAGNOSIS — R1111 Vomiting without nausea: Secondary | ICD-10-CM | POA: Diagnosis not present

## 2019-03-14 DIAGNOSIS — J690 Pneumonitis due to inhalation of food and vomit: Secondary | ICD-10-CM | POA: Diagnosis present

## 2019-03-14 DIAGNOSIS — K5669 Other partial intestinal obstruction: Secondary | ICD-10-CM | POA: Diagnosis not present

## 2019-03-14 DIAGNOSIS — Z86711 Personal history of pulmonary embolism: Secondary | ICD-10-CM

## 2019-03-14 DIAGNOSIS — R627 Adult failure to thrive: Secondary | ICD-10-CM | POA: Diagnosis present

## 2019-03-14 DIAGNOSIS — Z881 Allergy status to other antibiotic agents status: Secondary | ICD-10-CM

## 2019-03-14 DIAGNOSIS — N1832 Chronic kidney disease, stage 3b: Secondary | ICD-10-CM | POA: Diagnosis present

## 2019-03-14 DIAGNOSIS — I4510 Unspecified right bundle-branch block: Secondary | ICD-10-CM | POA: Diagnosis present

## 2019-03-14 DIAGNOSIS — E440 Moderate protein-calorie malnutrition: Secondary | ICD-10-CM | POA: Diagnosis present

## 2019-03-14 DIAGNOSIS — Z7952 Long term (current) use of systemic steroids: Secondary | ICD-10-CM | POA: Diagnosis not present

## 2019-03-14 DIAGNOSIS — Z8249 Family history of ischemic heart disease and other diseases of the circulatory system: Secondary | ICD-10-CM | POA: Diagnosis not present

## 2019-03-14 DIAGNOSIS — R1030 Lower abdominal pain, unspecified: Secondary | ICD-10-CM | POA: Diagnosis not present

## 2019-03-14 DIAGNOSIS — Z803 Family history of malignant neoplasm of breast: Secondary | ICD-10-CM | POA: Diagnosis not present

## 2019-03-14 DIAGNOSIS — E039 Hypothyroidism, unspecified: Secondary | ICD-10-CM | POA: Diagnosis present

## 2019-03-14 DIAGNOSIS — R1110 Vomiting, unspecified: Secondary | ICD-10-CM | POA: Diagnosis not present

## 2019-03-14 DIAGNOSIS — Z03818 Encounter for observation for suspected exposure to other biological agents ruled out: Secondary | ICD-10-CM | POA: Diagnosis not present

## 2019-03-14 DIAGNOSIS — I482 Chronic atrial fibrillation, unspecified: Secondary | ICD-10-CM | POA: Diagnosis present

## 2019-03-14 DIAGNOSIS — K5651 Intestinal adhesions [bands], with partial obstruction: Secondary | ICD-10-CM | POA: Diagnosis present

## 2019-03-14 DIAGNOSIS — Z7901 Long term (current) use of anticoagulants: Secondary | ICD-10-CM | POA: Diagnosis not present

## 2019-03-14 DIAGNOSIS — R112 Nausea with vomiting, unspecified: Secondary | ICD-10-CM | POA: Diagnosis not present

## 2019-03-14 DIAGNOSIS — Z86718 Personal history of other venous thrombosis and embolism: Secondary | ICD-10-CM

## 2019-03-14 DIAGNOSIS — Z882 Allergy status to sulfonamides status: Secondary | ICD-10-CM

## 2019-03-14 DIAGNOSIS — K56609 Unspecified intestinal obstruction, unspecified as to partial versus complete obstruction: Secondary | ICD-10-CM | POA: Diagnosis present

## 2019-03-14 DIAGNOSIS — I5032 Chronic diastolic (congestive) heart failure: Secondary | ICD-10-CM | POA: Diagnosis not present

## 2019-03-14 DIAGNOSIS — Z66 Do not resuscitate: Secondary | ICD-10-CM | POA: Diagnosis present

## 2019-03-14 DIAGNOSIS — Z95828 Presence of other vascular implants and grafts: Secondary | ICD-10-CM

## 2019-03-14 DIAGNOSIS — Z681 Body mass index (BMI) 19 or less, adult: Secondary | ICD-10-CM | POA: Diagnosis not present

## 2019-03-14 DIAGNOSIS — R0602 Shortness of breath: Secondary | ICD-10-CM | POA: Diagnosis not present

## 2019-03-14 DIAGNOSIS — Z79890 Hormone replacement therapy (postmenopausal): Secondary | ICD-10-CM | POA: Diagnosis not present

## 2019-03-14 DIAGNOSIS — Z20822 Contact with and (suspected) exposure to covid-19: Secondary | ICD-10-CM | POA: Diagnosis present

## 2019-03-14 DIAGNOSIS — E860 Dehydration: Secondary | ICD-10-CM | POA: Diagnosis not present

## 2019-03-14 DIAGNOSIS — K5901 Slow transit constipation: Secondary | ICD-10-CM | POA: Diagnosis not present

## 2019-03-14 LAB — CBC WITH DIFFERENTIAL/PLATELET
Abs Immature Granulocytes: 0.08 10*3/uL — ABNORMAL HIGH (ref 0.00–0.07)
Basophils Absolute: 0 10*3/uL (ref 0.0–0.1)
Basophils Relative: 0 %
Eosinophils Absolute: 0 10*3/uL (ref 0.0–0.5)
Eosinophils Relative: 0 %
HCT: 40.7 % (ref 36.0–46.0)
Hemoglobin: 13.5 g/dL (ref 12.0–15.0)
Immature Granulocytes: 1 %
Lymphocytes Relative: 5 %
Lymphs Abs: 0.7 10*3/uL (ref 0.7–4.0)
MCH: 33.3 pg (ref 26.0–34.0)
MCHC: 33.2 g/dL (ref 30.0–36.0)
MCV: 100.2 fL — ABNORMAL HIGH (ref 80.0–100.0)
Monocytes Absolute: 1.1 10*3/uL — ABNORMAL HIGH (ref 0.1–1.0)
Monocytes Relative: 8 %
Neutro Abs: 12.3 10*3/uL — ABNORMAL HIGH (ref 1.7–7.7)
Neutrophils Relative %: 86 %
Platelets: 251 10*3/uL (ref 150–400)
RBC: 4.06 MIL/uL (ref 3.87–5.11)
RDW: 13.2 % (ref 11.5–15.5)
WBC: 14.2 10*3/uL — ABNORMAL HIGH (ref 4.0–10.5)
nRBC: 0 % (ref 0.0–0.2)

## 2019-03-14 LAB — COMPREHENSIVE METABOLIC PANEL
ALT: 12 U/L (ref 0–44)
AST: 21 U/L (ref 15–41)
Albumin: 3.8 g/dL (ref 3.5–5.0)
Alkaline Phosphatase: 46 U/L (ref 38–126)
Anion gap: 11 (ref 5–15)
BUN: 23 mg/dL (ref 8–23)
CO2: 29 mmol/L (ref 22–32)
Calcium: 9.3 mg/dL (ref 8.9–10.3)
Chloride: 90 mmol/L — ABNORMAL LOW (ref 98–111)
Creatinine, Ser: 1.15 mg/dL — ABNORMAL HIGH (ref 0.44–1.00)
GFR calc Af Amer: 47 mL/min — ABNORMAL LOW (ref >60)
GFR calc non Af Amer: 40 mL/min — ABNORMAL LOW (ref >60)
Glucose, Bld: 130 mg/dL — ABNORMAL HIGH (ref 70–99)
Potassium: 4.5 mmol/L (ref 3.5–5.1)
Sodium: 130 mmol/L — ABNORMAL LOW (ref 135–145)
Total Bilirubin: 1 mg/dL (ref 0.3–1.2)
Total Protein: 6 g/dL — ABNORMAL LOW (ref 6.5–8.1)

## 2019-03-14 LAB — URINALYSIS, ROUTINE W REFLEX MICROSCOPIC
Bilirubin Urine: NEGATIVE
Glucose, UA: NEGATIVE mg/dL
Hgb urine dipstick: NEGATIVE
Ketones, ur: NEGATIVE mg/dL
Nitrite: POSITIVE — AB
Protein, ur: NEGATIVE mg/dL
Specific Gravity, Urine: 1.014 (ref 1.005–1.030)
pH: 7 (ref 5.0–8.0)

## 2019-03-14 LAB — RESPIRATORY PANEL BY RT PCR (FLU A&B, COVID)
Influenza A by PCR: NEGATIVE
Influenza B by PCR: NEGATIVE
SARS Coronavirus 2 by RT PCR: NEGATIVE

## 2019-03-14 LAB — LACTIC ACID, PLASMA
Lactic Acid, Venous: 1.2 mmol/L (ref 0.5–1.9)
Lactic Acid, Venous: 1.5 mmol/L (ref 0.5–1.9)

## 2019-03-14 LAB — LIPASE, BLOOD: Lipase: 16 U/L (ref 11–51)

## 2019-03-14 MED ORDER — APIXABAN 2.5 MG PO TABS
2.5000 mg | ORAL_TABLET | Freq: Two times a day (BID) | ORAL | Status: DC
  Administered 2019-03-14 – 2019-03-17 (×6): 2.5 mg via ORAL
  Filled 2019-03-14 (×6): qty 1

## 2019-03-14 MED ORDER — SODIUM CHLORIDE 0.9 % IV SOLN: Freq: Once | INTRAVENOUS | Status: AC

## 2019-03-14 MED ORDER — PIPERACILLIN-TAZOBACTAM 3.375 G IVPB
3.3750 g | Freq: Three times a day (TID) | INTRAVENOUS | Status: DC
  Administered 2019-03-15 – 2019-03-17 (×8): 3.375 g via INTRAVENOUS
  Filled 2019-03-14 (×8): qty 50

## 2019-03-14 MED ORDER — PIPERACILLIN-TAZOBACTAM 3.375 G IVPB 30 MIN
3.3750 g | Freq: Once | INTRAVENOUS | Status: AC
  Administered 2019-03-14: 3.375 g via INTRAVENOUS
  Filled 2019-03-14: qty 50

## 2019-03-14 MED ORDER — SODIUM CHLORIDE (PF) 0.9 % IJ SOLN
INTRAMUSCULAR | Status: AC
  Filled 2019-03-14: qty 50

## 2019-03-14 MED ORDER — MORPHINE SULFATE (PF) 2 MG/ML IV SOLN
1.0000 mg | INTRAVENOUS | Status: DC | PRN
  Filled 2019-03-14: qty 1

## 2019-03-14 MED ORDER — ONDANSETRON HCL 4 MG PO TABS: 4.0000 mg | ORAL_TABLET | Freq: Four times a day (QID) | ORAL | Status: DC | PRN

## 2019-03-14 MED ORDER — LIDOCAINE HCL URETHRAL/MUCOSAL 2 % EX GEL
CUTANEOUS | Status: AC
  Filled 2019-03-14: qty 30

## 2019-03-14 MED ORDER — SODIUM CHLORIDE 0.9 % IV BOLUS
500.0000 mL | Freq: Once | INTRAVENOUS | Status: AC
  Administered 2019-03-14: 500 mL via INTRAVENOUS

## 2019-03-14 MED ORDER — KETOTIFEN FUMARATE 0.025 % OP SOLN
1.0000 [drp] | Freq: Two times a day (BID) | OPHTHALMIC | Status: DC
  Administered 2019-03-15 – 2019-03-17 (×4): 1 [drp] via OPHTHALMIC
  Filled 2019-03-14: qty 5

## 2019-03-14 MED ORDER — IOHEXOL 300 MG/ML  SOLN
80.0000 mL | Freq: Once | INTRAMUSCULAR | Status: AC | PRN
  Administered 2019-03-14: 80 mL via INTRAVENOUS

## 2019-03-14 MED ORDER — ONDANSETRON HCL 4 MG/2ML IJ SOLN: 4.0000 mg | Freq: Four times a day (QID) | INTRAMUSCULAR | Status: DC | PRN

## 2019-03-14 NOTE — Progress Notes (Signed)
Patient"s daughter requesting a less potent pain medication than morphine for her mother and that her eye drops and eye gel be ordered. Paged floor coverage.

## 2019-03-14 NOTE — Consult Note (Signed)
Reason for Consult:SBO  Referring Physician: Rancour MD   [name] is an 86 y.o. female.  HPI: Asked to see pleasant 86 yo female due to N/V and failure to thrive.  Hx of abdominal surgery and multiple pSBO in the past that have resolved nonoperatively.  She feels better today compared to yesterday.  Abdominal pain is improved and abdomen is less hard she states.  Family at bedside.  She has no more N/V currently.  CT shows SBO with questionable closed loop vs chronic adhesions but no free air or pneumatosis.  She currently denies abdominal pain. Last BM yesterday.  Pt states she has no interest in surgery.   Past Medical History:  Diagnosis Date  . Blindness   . Blood clot associated with vein wall inflammation   . CKD (chronic kidney disease), stage III   . Colitis   . Goiter    s/p radioactive iod. tx  . Hypothyroidism   . On amiodarone therapy   . Paroxysmal atrial fibrillation (HCC)   . Pulmonary embolus (HCC)    and DVT s/p IVC filter  . SBO (small bowel obstruction) ([place])   . Spinal stenosis of lumbar region with radiculopathy    possible neurogenic claudication recently?  . Temporal arteritis [facility])     Past Surgical History:  Procedure Laterality Date  . APPENDECTOMY  1940  . BACK SURGERY  04/1998   spinal stenosis  . BACK SURGERY  2000   Ruptured Disc  . BREAST BIOPSY     cysts in both breasts  . CARPAL TUNNEL RELEASE  2002  . ivc filter    . PARTIAL HYSTERECTOMY    . TONSILLECTOMY  1934  . TOTAL KNEE ARTHROPLASTY Left 04/1999  . TOTAL KNEE ARTHROPLASTY Right 02/2000    Family History  Problem Relation Age of Onset  . Breast cancer Sister   . Congestive Heart Failure Mother   . Stroke Father   . Crohn's disease Neg Hx   . Ulcerative colitis Neg Hx     Social History:  reports that she has never smoked. She has never used smokeless tobacco. She reports that she does not drink alcohol or use drugs.  Allergies:  Allergies  Allergen Reactions  .  Hydrocortisone Other (See Comments)  . Sulfa Antibiotics Nausea Only  . Cefdinir Other (See Comments)    Caused bad diarrhea Caused bad diarrhea Caused bad diarrhea Caused bad diarrhea Caused bad diarrhea  . Cortisone Other (See Comments)    Flushed hands and face  Flushed hands and face Flushed hands and face  Flushed hands and face Flushed hands and face  . Prednisone Other (See Comments)    cuased blood clots Can have very small dose cuased blood clots cuased blood clots Can have very small dose cuased blood clots cuased blood clots  . Sulfasalazine Other (See Comments)    Nausea side effects Stomach pains and cramping Nausea side effects Stomach pains and cramping Stomach pains and cramping  . Sulfonamide Derivatives     Stomach pains and cramping     Medications: I have reviewed the patient's current medications.  Results for orders placed or performed during the hospital encounter of 03/14/19 (from the past 48 hour(s))  Urinalysis, Routine w reflex microscopic     Status: Abnormal   Collection Time: 03/14/19  3:49 PM  Result Value Ref Range   Color, Urine YELLOW YELLOW   APPearance CLEAR CLEAR   Specific Gravity, Urine 1.014 1.005 - 1.030  pH 7.0 5.0 - 8.0   Glucose, UA NEGATIVE NEGATIVE mg/dL   Hgb urine dipstick NEGATIVE NEGATIVE   Bilirubin Urine NEGATIVE NEGATIVE   Ketones, ur NEGATIVE NEGATIVE mg/dL   Protein, ur NEGATIVE NEGATIVE mg/dL   Nitrite POSITIVE (A) NEGATIVE   Leukocytes,Ua SMALL (A) NEGATIVE   RBC / HPF 6-10 0 - 5 RBC/hpf   WBC, UA 11-20 0 - 5 WBC/hpf   Bacteria, UA RARE (A) NONE SEEN   Squamous Epithelial / LPF 0-5 0 - 5    Comment: Performed at [facility], [place] [address], [place], [place] [address]  Comprehensive metabolic panel     Status: Abnormal   Collection Time: 03/14/19  4:32 PM  Result Value Ref Range   Sodium 130 (L) 135 - 145 mmol/L   Potassium 4.5 3.5 - 5.1 mmol/L   Chloride 90 (L) 98 - 111 mmol/L    CO2 29 22 - 32 mmol/L   Glucose, Bld 130 (H) 70 - 99 mg/dL    Comment: Glucose reference range applies only to samples taken after fasting for at least 8 hours.   BUN 23 8 - 23 mg/dL   Creatinine, Ser 1.15 (H) 0.44 - 1.00 mg/dL   Calcium 9.3 8.9 - 10.3 mg/dL   Total Protein 6.0 (L) 6.5 - 8.1 g/dL   Albumin 3.8 3.5 - 5.0 g/dL   AST 21 15 - 41 U/L   ALT 12 0 - 44 U/L   Alkaline Phosphatase 46 38 - 126 U/L   Total Bilirubin 1.0 0.3 - 1.2 mg/dL   GFR calc non Af Amer 40 (L) >60 mL/min   GFR calc Af Amer 47 (L) >60 mL/min   Anion gap 11 5 - 15    Comment: Performed at [facility], [place] [address] [place], [place] [address]  CBC with Differential     Status: Abnormal   Collection Time: 03/14/19  4:32 PM  Result Value Ref Range   WBC 14.2 (H) 4.0 - 10.5 K/uL   RBC 4.06 3.87 - 5.11 MIL/uL   Hemoglobin 13.5 12.0 - 15.0 g/dL   HCT 40.7 36.0 - 46.0 %   MCV 100.2 (H) 80.0 - 100.0 fL   MCH 33.3 26.0 - 34.0 pg   MCHC 33.2 30.0 - 36.0 g/dL   RDW 13.2 11.5 - 15.5 %   Platelets 251 150 - 400 K/uL   nRBC 0.0 0.0 - 0.2 %   Neutrophils Relative % 86 %   Neutro Abs 12.3 (H) 1.7 - 7.7 K/uL   Lymphocytes Relative 5 %   Lymphs Abs 0.7 0.7 - 4.0 K/uL   Monocytes Relative 8 %   Monocytes Absolute 1.1 (H) 0.1 - 1.0 K/uL   Eosinophils Relative 0 %   Eosinophils Absolute 0.0 0.0 - 0.5 K/uL   Basophils Relative 0 %   Basophils Absolute 0.0 0.0 - 0.1 K/uL   Immature Granulocytes 1 %   Abs Immature Granulocytes 0.08 (H) 0.00 - 0.07 K/uL    Comment: Performed at [facility], [place] [address] [place], [place] [address]  Lipase, blood     Status: None   Collection Time: 03/14/19  4:32 PM  Result Value Ref Range   Lipase 16 11 - 51 U/L    Comment: Performed at [facility], [place] [address], [place], [place] [address]  Lactic acid, plasma     Status: None   Collection Time: 03/14/19  7:08 PM  Result  Value Ref Range   Lactic Acid, Venous 1.2 0.5  - 1.9 mmol/L    Comment: Performed at [facility], [place] [address], [place], [place] [address]    CT Abdomen Pelvis W Contrast  Result Date: 03/14/2019 CLINICAL DATA:  Nausea vomiting. EXAM: CT ABDOMEN AND PELVIS WITH CONTRAST TECHNIQUE: Multidetector CT imaging of the abdomen and pelvis was performed using the standard protocol following bolus administration of intravenous contrast. CONTRAST:  29mL OMNIPAQUE IOHEXOL 300 MG/ML  SOLN COMPARISON:  04/16/2017 FINDINGS: Lower chest: Patchy areas of airspace opacity in the right middle lobe with surrounding ground-glass attenuation, have developed since the study of 11/16/2017. Small pericardial effusion. Signs of cardiac enlargement and coronary artery disease. Ectasia of the descending thoracic aorta showing aneurysmal caliber of 4.7 cm in the distal portion within 1-2 mm of previous measurement. Hepatobiliary: Liver with lobular contours. Patent portal vein. Gallbladder is mildly distended without significant pericholecystic stranding. Mild intrahepatic biliary ductal distension and mild intrahepatic biliary ductal distension. Pancreas: Pancreatic atrophy particularly in the pancreatic head. No signs of ductal dilation. Spleen: Spleen normal size without focal lesion. Adrenals/Urinary Tract: Adrenal glands are normal. Kidneys with smooth contours and symmetric enhancement mild parenchymal loss. Urinary bladder is unremarkable. Stomach/Bowel: Marked distension of centralized bowel loops. Multiple areas of alternating dilation and narrowing more so than on prior studies. Overall the pattern of dilation and narrowing is similar but the degree of dilation is much worse than on prior studies, small bowel in the central abdomen measuring as large as 4 cm. Decompression of the ileum No current signs of pneumatosis. ?Paper thin? wall of the bowel in the central abdomen that shows both in bound and out bound sites of narrowing. Vascular/Lymphatic:  Marked calcific and noncalcific atherosclerotic changes in the abdominal aorta. No signs of adenopathy. IVC filter remaining in place in the mid IVC below the level of the renal veins. No signs of pelvic adenopathy. Reproductive: Uterus is unremarkable. No adnexal mass. Other: Signs of pelvic ascites and subtle areas of scattered interloop fluid and mesenteric edema. Musculoskeletal: No signs of acute bone finding or evidence of destructive bone process. Osteopenia and spinal degenerative change. IMPRESSION: 1. Marked distension of bowel loops with multiple areas of alternating dilation and narrowing. The degree of dilation is much worse than on prior studies. No current signs of pneumatosis. Findings are concerning for high-grade partial small bowel obstruction. Some areas with paper thin bowel raising the question of early ischemia, correlate with lactate. 2. Multiple adhesions favored as the etiology, pattern could also be seen in the setting of NSAID enteropathy. Some areas show (in bound side of narrowing seen on image 54 of series 5 and out bound area of narrowing seen on image 33 of series 5) in the low abdomen leading into a dilated segment. Out bound site of narrowing on image closed loop configuration, centralized more dilated loops of bowel for instance in the mid abdomen show areas of narrowing leading into dilated segment and areas of narrowing leading out of the dilated bowel. Surgical consultation may be warranted. 3. Pelvic ascites and mesenteric edema. 4. Signs of presumed pneumonia in the right middle lobe. Attention on follow-up to ensure resolution. 5. Aneurysmal dilation of the thoracic aorta similar to prior study. 6. Mild intra and extrahepatic biliary ductal distension and gallbladder distension is nonspecific, correlate with laboratory values. 7. Cardiomegaly and signs of coronary artery disease. These results were called by telephone at the time of interpretation on 03/14/2019 at 6:25  pm to  provider Dr. Wyvonnia Dusky, Who verbally acknowledged these results. Aortic Atherosclerosis (ICD10-I70.0). Electronically Signed   By: Zetta Bills M.D.   On: 03/14/2019 18:26   DG Chest Portable 1 View  Result Date: 03/14/2019 CLINICAL DATA:  Vomiting. Known small bowel obstruction. Shortness of breath. History of pulmonary embolism. EXAM: PORTABLE CHEST 1 VIEW COMPARISON:  09/12/2018 FINDINGS: Two AP portable radiographs. Midline trachea. Normal heart size. Atherosclerosis in the transverse aorta. No pleural effusion or pneumothorax. Right base airspace disease. Right hemidiaphragm elevation. IMPRESSION: Right base airspace disease, suspicious for infection or aspiration. Electronically Signed   By: Abigail Miyamoto M.D.   On: 03/14/2019 19:11    Review of Systems  Constitutional: Negative.   HENT: Negative.   Eyes: Positive for visual disturbance.  Respiratory: Negative.   Gastrointestinal: Positive for abdominal distention, abdominal pain, nausea and vomiting.  Genitourinary: Negative.   Neurological: Positive for weakness.  Psychiatric/Behavioral: Negative.    Blood pressure (!) 131/57, pulse 66, temperature 98.1 F (36.7 C), temperature source Oral, resp. rate 13, SpO2 99 %. Physical Exam  Constitutional: She appears distressed.  Eyes:  clinically blind   Cardiovascular: Normal rate.  A fib  Respiratory: Effort normal and breath sounds normal.  GI: She exhibits distension. She exhibits no mass. There is no abdominal tenderness. There is no rebound and no guarding.  Musculoskeletal:        General: Normal range of motion.     Cervical back: Normal range of motion and neck supple.  Neurological: She is alert.  Skin: Skin is warm and dry.  Psychiatric: She has a normal mood and affect. Her behavior is normal. Thought content normal.    Assessment/Plan: SBO- pleasant 86 yo female no more N/V and abdomen currently benign  Ok to follow   KUB in am  Hold on NGT for now  Family at  bedside   Do not want any surgical intervention   Marcello Moores A Marck Mcclenny 03/14/2019, 8:19 PM

## 2019-03-14 NOTE — ED Provider Notes (Signed)
[facility] DEPT Provider Note   CSN: [id number] Arrival date & time: 03/14/19  1530     History Chief Complaint  Patient presents with  . Emesis    [name] is a 86 y.o. female with a history of blindness, stage III CKD, colitis, paroxysmal afib on eliquis, prior VTE s/p IVC filter, CHF last ef 55-60%, prior SBO, & prior appendectomy & partial hysterectomy who presents to the ED via EMS with complaints of N/V last night and concern for dehydration today. Patient states that she had TNTC episode of emesis last night, her family member works as a IT sales professional and came over to give her IM zofran which helped her sxs. Has not had any vomiting today and is not nauseous at present, tolerated a little bit of PO today, concern for dehydration by patient and family.  States she has intermittent abdominal pain but none at present.  Spoke with patient's daughter to obtain additional history. She lives @ home with family and is well taken care of. Last BM yesterday, does not think she has been passing gas today. Denies fever, chill,  hematemesis, melena, hematochezia, dysuria, chest pain, or dyspnea. EMS provided history as well.   HPI     Past Medical History:  Diagnosis Date  . Blindness   . Blood clot associated with vein wall inflammation   . CKD (chronic kidney disease), stage III   . Colitis   . Goiter    s/p radioactive iod. tx  . Hypothyroidism   . On amiodarone therapy   . Paroxysmal atrial fibrillation (HCC)   . Pulmonary embolus (HCC)    and DVT s/p IVC filter  . SBO (small bowel obstruction) ([place])   . Spinal stenosis of lumbar region with radiculopathy    possible neurogenic claudication recently?  . Temporal arteritis [facility])     Patient Active Problem List   Diagnosis Date Noted  . UTI (urinary tract infection) 09/13/2018  . Persistent atrial fibrillation ([place]) 09/12/2018  . Hypotension   . Anticoagulated on warfarin 11/23/2016  .  Encounter for monitoring amiodarone therapy 07/07/2016  . Mild anemia 11/28/2015  . Chronic diastolic heart failure ([place]) 12/29/2013  . SBO (small bowel obstruction) ([place]) 01/17/2013  . Microscopic colitis 01/17/2013  . Leukocytosis 01/17/2013  . Aspiration pneumonia ([place]) 01/17/2013  . CKD (chronic kidney disease), stage IV ([place])   . SYNCOPE 09/21/2008  . Disorder of thyroid 09/17/2008  . Atrial fibrillation with RVR ([place]) 09/17/2008  . TEMPORAL ARTERITIS 09/17/2008  . Arthropathy 09/17/2008  . DYSPNEA ON EXERTION 09/17/2008    Past Surgical History:  Procedure Laterality Date  . APPENDECTOMY  1940  . BACK SURGERY  04/1998   spinal stenosis  . BACK SURGERY  2000   Ruptured Disc  . BREAST BIOPSY     cysts in both breasts  . CARPAL TUNNEL RELEASE  2002  . ivc filter    . PARTIAL HYSTERECTOMY    . TONSILLECTOMY  1934  . TOTAL KNEE ARTHROPLASTY Left 04/1999  . TOTAL KNEE ARTHROPLASTY Right 02/2000     OB History   No obstetric history on file.     Family History  Problem Relation Age of Onset  . Breast cancer Sister   . Congestive Heart Failure Mother   . Stroke Father   . Crohn's disease Neg Hx   . Ulcerative colitis Neg Hx     Social History   Tobacco Use  . Smoking status: Never Smoker  .  Smokeless tobacco: Never Used  Substance Use Topics  . Alcohol use: No  . Drug use: No    Home Medications Prior to Admission medications   Medication Sig Start Date End Date Taking? Authorizing Provider  acetaminophen (TYLENOL) 500 MG tablet Take 500-1,000 mg by mouth See admin instructions. Take 500mg  in the morning, 500mg  midday and 1000mg  at bedtime.    [provider]  amiodarone (PACERONE) 200 MG tablet Take 1 tablet (200 mg total) by mouth daily. 09/11/18   Belva Crome, MD  apixaban (ELIQUIS) 2.5 MG TABS tablet Take 1 tablet (2.5 mg total) by mouth 2 (two) times daily. 04/16/18   Belva Crome, MD  Calcium Carbonate-Vitamin D (CALCIUM HIGH POTENCY/VITAMIN  D) 600-200 MG-UNIT TABS Take 1 tablet by mouth daily.     [provider]  CRANBERRY EXTRACT PO Take 2 tablets by mouth 2 (two) times daily.     [provider]  gabapentin (NEURONTIN) 100 MG capsule Take 100-200 mg by mouth 4 (four) times daily.     [provider]  levothyroxine (SYNTHROID, LEVOTHROID) 150 MCG tablet Take 150 mcg by mouth daily before breakfast.    [provider]  lubiprostone (AMITIZA) 8 MCG capsule Take 8 mcg by mouth 3 (three) times daily with meals.     [provider]  Misc Natural Products (GLUCOSAMINE CHONDROITIN COMPLX) TABS Take 1 tablet by mouth 2 (two) times daily.     [provider]  Multiple Vitamins-Minerals (OCUVITE ADULT 50+ PO) Take 1 tablet by mouth daily.    [provider]  nitrofurantoin (MACRODANTIN) 50 MG capsule Take 50 mg by mouth daily.    [provider]  nitroGLYCERIN (NITROSTAT) 0.4 MG SL tablet Place 1 tablet (0.4 mg total) under the tongue every 5 (five) minutes as needed for chest pain. 09/07/18 12/06/18  Belva Crome, MD  predniSONE (DELTASONE) 5 MG tablet Take 5 mg by mouth daily.     [provider]  Probiotic Product ([place]) Take 1 tablet by mouth daily.     [provider]  traMADol (ULTRAM) 50 MG tablet Take 50 mg by mouth daily as needed for moderate pain.     [provider]  trolamine salicylate (ASPERCREME) 10 % cream Apply 1 application topically as needed for muscle pain.    [provider]    Allergies    Hydrocortisone, Sulfa antibiotics, Cefdinir, Cortisone, Prednisone, Sulfasalazine, and Sulfonamide derivatives  Review of Systems   Review of Systems  Constitutional: Negative for chills and fever.  Respiratory: Negative for shortness of breath.   Cardiovascular: Negative for chest pain.  Gastrointestinal: Positive for abdominal pain, nausea and vomiting.  Genitourinary: Negative for dysuria.    Neurological: Negative for syncope.  All other systems reviewed and are negative.   Physical Exam Updated Vital Signs BP (!) 130/56 (BP Location: Right Arm)   Pulse 65   Temp 98.1 F (36.7 C) (Oral)   Resp 15   SpO2 98%   Physical Exam Vitals and nursing note reviewed.  Constitutional:      General: She is not in acute distress.    Appearance: She is well-developed. She is not toxic-appearing.  HENT:     Head: Normocephalic and atraumatic.     Mouth/Throat:     Mouth: Mucous membranes are dry.  Eyes:     General:        Right eye: No discharge.  Left eye: No discharge.     Conjunctiva/sclera: Conjunctivae normal.  Cardiovascular:     Rate and Rhythm: Normal rate and regular rhythm.  Pulmonary:     Effort: Pulmonary effort is normal. No respiratory distress.     Breath sounds: Normal breath sounds. No wheezing, rhonchi or rales.  Abdominal:     General: There is distension (mild).     Palpations: Abdomen is soft.     Tenderness: There is abdominal tenderness (generalized). There is no guarding or rebound.     Comments: Tympany to percussion to the upper abdomen.   Musculoskeletal:     Cervical back: Neck supple.  Skin:    General: Skin is warm and dry.     Findings: No rash.  Neurological:     Mental Status: She is alert.     Comments: Clear speech.   Psychiatric:        Behavior: Behavior normal.     ED Results / Procedures / Treatments   Labs (all labs ordered are listed, but only abnormal results are displayed) Labs Reviewed  COMPREHENSIVE METABOLIC PANEL  CBC WITH DIFFERENTIAL/PLATELET  LIPASE, BLOOD  URINALYSIS, ROUTINE W REFLEX MICROSCOPIC    EKG EKG Interpretation  Date/Time:  Sunday March 14 2019 16:40:37 EST Ventricular Rate:  60 PR Interval:    QRS Duration: 115 QT Interval:  555 QTC Calculation: 555 R Axis:   11 Text Interpretation: Junctional rhythm Incomplete right bundle branch block Anteroseptal infarct, age indeterminate  Rate slower similar to august 2020 Confirmed by Ezequiel Essex [phone]) on 03/14/2019 4:48:13 PM   Radiology No results found.  Procedures Procedures (including critical care time)  Medications Ordered in ED Medications  sodium chloride 0.9 % bolus 500 mL (500 mLs Intravenous New Bag/Given 03/14/19 1633)    ED Course  I have reviewed the triage vital signs and the nursing notes.  Pertinent labs & imaging results that were available during my care of the patient were reviewed by me and considered in my medical decision making (see chart for details).    SHELENE KRAGE was evaluated in Emergency Department on 03/14/2019 for the symptoms described in the history of present illness. He/she was evaluated in the context of the global COVID-19 pandemic, which necessitated consideration that the patient might be at risk for infection with the SARS-CoV-2 virus that causes COVID-19. Institutional protocols and algorithms that pertain to the evaluation of patients at risk for COVID-19 are in a state of rapid change based on information released by regulatory bodies including the CDC and federal and state organizations. These policies and algorithms were followed during the patient's care in the ED.  MDM Rules/Calculators/A&P                      Patient presents to the emergency department via EMS for evaluation of nausea and vomiting last night with concern for dehydration.  Patient is nontoxic, resting comfortably, vitals without significant abnormality.  Her mucous membranes are dry.  Her abdomen is mildly distended with generalized tenderness.  We will start with 500 cc of fluid over 2 hours, avoid aggressive hydration with history of CHF.  Patient states she does not feel nauseated and she is not having much pain, declining antiemetics or analgesics currently.  EKG with junctional rhythm.   CBC: Leukocytosis of 14.2 with left shift.  No anemia. CMP: Renal function similar to prior. Mild  hyponatremia & hypochloremia. No significant electrolyte derangement. Mildly low protein.  Lipase:  WNL  17:52: This is a shared visit with supervising physician Dr. Wyvonnia Dusky who has evaluated patient, care transitioned to Dr. Wyvonnia Dusky @ change of shift pending remaining results, re-eval, and disposition.   Final Clinical Impression(s) / ED Diagnoses Final diagnoses:  None    Rx / DC Orders ED Discharge Orders    None       Amaryllis Dyke, PA-C 03/14/19 1753    Ezequiel Essex, MD 03/14/19 2332

## 2019-03-14 NOTE — ED Triage Notes (Signed)
Patient coming from home with complain of nausea and vomiting since last night.

## 2019-03-14 NOTE — Progress Notes (Signed)
Pharmacy Antibiotic Note  [name] is a 86 y.o. female admitted on 03/14/2019 with small bowel obstruction and probable aspiration pneumonia.  Pharmacy has been consulted for Zosyn dosing.  Patient received Zosyn 3.375 g x 1 dose in ED.  Weight during previous encounter on 09/12/18 was 50.3 kg.  Current SCr of 1.15 appears to be near patient's baseline.  Estimated CrCl of 22 mL/min is just above threshold for proceeding with extended-interval dosing of Zosyn.  Plan: Zosyn 3.375 grams IV q8h (each dose over 4 hours), next at 0400 03/15/19. Follow renal function, any culture results, clinical course.     Temp (24hrs), Avg:98.1 F (36.7 C), Min:98.1 F (36.7 C), Max:98.1 F (36.7 C)  Recent Labs  Lab 03/14/19 1632 03/14/19 1908  WBC 14.2*  --   CREATININE 1.15*  --   LATICACIDVEN  --  1.2    CrCl cannot be calculated (Unknown ideal weight.).    Allergies  Allergen Reactions  . Hydrocortisone Other (See Comments)  . Sulfa Antibiotics Nausea Only  . Cefdinir Other (See Comments)    Caused bad diarrhea Caused bad diarrhea Caused bad diarrhea Caused bad diarrhea Caused bad diarrhea  . Cortisone Other (See Comments)    Flushed hands and face  Flushed hands and face Flushed hands and face  Flushed hands and face Flushed hands and face  . Prednisone Other (See Comments)    cuased blood clots Can have very small dose cuased blood clots cuased blood clots Can have very small dose cuased blood clots cuased blood clots  . Sulfasalazine Other (See Comments)    Nausea side effects Stomach pains and cramping Nausea side effects Stomach pains and cramping Stomach pains and cramping  . Sulfonamide Derivatives     Stomach pains and cramping     Antimicrobials this admission: 2/28 Zosyn >>    Dose adjustments this admission: n/a  Microbiology results: n/a  Thank you for allowing pharmacy to be a part of this patient's care.  Clayburn Pert, PharmD,  BCPS 03/14/2019  8:56 PM

## 2019-03-14 NOTE — ED Notes (Signed)
Hospitalist at bedside 

## 2019-03-14 NOTE — Progress Notes (Signed)
A consult was received from an ED physician for Zosyn per pharmacy dosing (for an indication other than meningitis). The patient's profile has been reviewed for ht/wt/allergies/indication/available labs. A one time order has been placed for the above antibiotics.  Further antibiotics/pharmacy consults should be ordered by admitting physician if indicated.        Ardeen Garland, PharmD  [phone] 03/14/2019, 6:32 PM

## 2019-03-14 NOTE — ED Notes (Signed)
Hospitalist, RN Corey Skains, and Daughter at bedside to confirm patients DNR status.

## 2019-03-14 NOTE — ED Provider Notes (Signed)
Called by Dr. Jacalyn Lefevre radiology regarding concerning CT scan results.  There is evidence of likely closed-loop obstruction with dilated bowel loops proximally and distally.  There is signs of early bowel wall ischemia.  Discussed with surgery Dr. Brantley Stage.  He will evaluate patient.  Recommends medical admission and NG tube placement..  Will also treat possible pneumonia seen on Xray likely aspiration.   Start IV antibiotics. Admission d/w Dr. Humphrey Rolls. Patient and family updated. She states that she has no interest in surgery.  CRITICAL CARE Performed by: Ezequiel Essex Total critical care time: 35 minutes Critical care time was exclusive of separately billable procedures and treating other patients. Critical care was necessary to treat or prevent imminent or life-threatening deterioration. Critical care was time spent personally by me on the following activities: development of treatment plan with patient and/or surrogate as well as nursing, discussions with consultants, evaluation of patient's response to treatment, examination of patient, obtaining history from patient or surrogate, ordering and performing treatments and interventions, ordering and review of laboratory studies, ordering and review of radiographic studies, pulse oximetry and re-evaluation of patient's condition.    Ezequiel Essex, MD 03/14/19 2330

## 2019-03-14 NOTE — ED Notes (Addendum)
Spoke to General Surgery who states patient and daughter are not wanting the NG tube unless absolutely necessary, therefore we will wait to insert NG tube if patient begins vomiting or abdomen becomes distended.

## 2019-03-15 ENCOUNTER — Inpatient Hospital Stay (HOSPITAL_COMMUNITY): Payer: Medicare Other

## 2019-03-15 DIAGNOSIS — K56609 Unspecified intestinal obstruction, unspecified as to partial versus complete obstruction: Secondary | ICD-10-CM

## 2019-03-15 DIAGNOSIS — I482 Chronic atrial fibrillation, unspecified: Secondary | ICD-10-CM | POA: Diagnosis present

## 2019-03-15 LAB — COMPREHENSIVE METABOLIC PANEL
ALT: 12 U/L (ref 0–44)
AST: 16 U/L (ref 15–41)
Albumin: 3.2 g/dL — ABNORMAL LOW (ref 3.5–5.0)
Alkaline Phosphatase: 38 U/L (ref 38–126)
Anion gap: 10 (ref 5–15)
BUN: 18 mg/dL (ref 8–23)
CO2: 25 mmol/L (ref 22–32)
Calcium: 8.6 mg/dL — ABNORMAL LOW (ref 8.9–10.3)
Chloride: 98 mmol/L (ref 98–111)
Creatinine, Ser: 1.05 mg/dL — ABNORMAL HIGH (ref 0.44–1.00)
GFR calc Af Amer: 52 mL/min — ABNORMAL LOW (ref >60)
GFR calc non Af Amer: 45 mL/min — ABNORMAL LOW (ref >60)
Glucose, Bld: 85 mg/dL (ref 70–99)
Potassium: 4.2 mmol/L (ref 3.5–5.1)
Sodium: 133 mmol/L — ABNORMAL LOW (ref 135–145)
Total Bilirubin: 1 mg/dL (ref 0.3–1.2)
Total Protein: 5.2 g/dL — ABNORMAL LOW (ref 6.5–8.1)

## 2019-03-15 LAB — CBC
HCT: 37.1 % (ref 36.0–46.0)
Hemoglobin: 12.5 g/dL (ref 12.0–15.0)
MCH: 34.1 pg — ABNORMAL HIGH (ref 26.0–34.0)
MCHC: 33.7 g/dL (ref 30.0–36.0)
MCV: 101.1 fL — ABNORMAL HIGH (ref 80.0–100.0)
Platelets: 227 10*3/uL (ref 150–400)
RBC: 3.67 MIL/uL — ABNORMAL LOW (ref 3.87–5.11)
RDW: 13.3 % (ref 11.5–15.5)
WBC: 9.3 10*3/uL (ref 4.0–10.5)
nRBC: 0 % (ref 0.0–0.2)

## 2019-03-15 MED ORDER — MUSCLE RUB 10-15 % EX CREA
TOPICAL_CREAM | Freq: Two times a day (BID) | CUTANEOUS | Status: DC
  Administered 2019-03-16: 1 via TOPICAL
  Filled 2019-03-15: qty 85

## 2019-03-15 MED ORDER — HYPROMELLOSE (GONIOSCOPIC) 2.5 % OP SOLN: 1.0000 [drp] | Freq: Three times a day (TID) | OPHTHALMIC | Status: DC

## 2019-03-15 MED ORDER — BISACODYL 10 MG RE SUPP
10.0000 mg | Freq: Once | RECTAL | Status: DC
  Filled 2019-03-15: qty 1

## 2019-03-15 MED ORDER — GABAPENTIN 100 MG PO CAPS
100.0000 mg | ORAL_CAPSULE | Freq: Four times a day (QID) | ORAL | Status: DC
  Administered 2019-03-15 – 2019-03-17 (×10): 100 mg via ORAL
  Filled 2019-03-15 (×10): qty 1

## 2019-03-15 MED ORDER — TRAMADOL HCL 50 MG PO TABS
50.0000 mg | ORAL_TABLET | Freq: Every day | ORAL | Status: DC | PRN
  Administered 2019-03-15: 50 mg via ORAL
  Filled 2019-03-15 (×2): qty 1

## 2019-03-15 MED ORDER — LEVOTHYROXINE SODIUM 75 MCG PO TABS: 150.0000 ug | ORAL_TABLET | Freq: Every day | ORAL | Status: DC

## 2019-03-15 MED ORDER — ARTIFICIAL TEARS OPHTHALMIC OINT
TOPICAL_OINTMENT | Freq: Every day | OPHTHALMIC | Status: DC
  Administered 2019-03-16: 1 via OPHTHALMIC
  Filled 2019-03-15: qty 3.5

## 2019-03-15 MED ORDER — LIDOCAINE HCL 4 % EX LIQD: CUTANEOUS | Status: DC | PRN

## 2019-03-15 MED ORDER — LEVOTHYROXINE SODIUM 75 MCG PO TABS
150.0000 ug | ORAL_TABLET | Freq: Every day | ORAL | Status: DC
  Administered 2019-03-15 – 2019-03-17 (×3): 150 ug via ORAL
  Filled 2019-03-15 (×3): qty 2

## 2019-03-15 MED ORDER — POLYVINYL ALCOHOL 1.4 % OP SOLN
1.0000 [drp] | Freq: Three times a day (TID) | OPHTHALMIC | Status: DC
  Administered 2019-03-15 – 2019-03-17 (×5): 1 [drp] via OPHTHALMIC
  Filled 2019-03-15: qty 15

## 2019-03-15 MED ORDER — AMIODARONE HCL 200 MG PO TABS
200.0000 mg | ORAL_TABLET | Freq: Every day | ORAL | Status: DC
  Administered 2019-03-15 – 2019-03-17 (×3): 200 mg via ORAL
  Filled 2019-03-15 (×3): qty 1

## 2019-03-15 MED ORDER — TRAMADOL HCL 50 MG PO TABS
50.0000 mg | ORAL_TABLET | Freq: Two times a day (BID) | ORAL | Status: DC | PRN
  Administered 2019-03-15 – 2019-03-17 (×4): 50 mg via ORAL
  Filled 2019-03-15 (×4): qty 1

## 2019-03-15 MED ORDER — LEVOTHYROXINE SODIUM 100 MCG/5ML IV SOLN
75.0000 ug | Freq: Every day | INTRAVENOUS | Status: DC
  Filled 2019-03-15: qty 5

## 2019-03-15 MED ORDER — KETOROLAC TROMETHAMINE 15 MG/ML IJ SOLN
15.0000 mg | Freq: Once | INTRAMUSCULAR | Status: AC
  Administered 2019-03-15: 15 mg via INTRAVENOUS
  Filled 2019-03-15: qty 1

## 2019-03-15 MED ORDER — PREDNISONE 5 MG PO TABS
5.0000 mg | ORAL_TABLET | Freq: Every day | ORAL | Status: DC
  Administered 2019-03-15 – 2019-03-17 (×3): 5 mg via ORAL
  Filled 2019-03-15 (×3): qty 1

## 2019-03-15 MED ORDER — LUBIPROSTONE 8 MCG PO CAPS
8.0000 ug | ORAL_CAPSULE | Freq: Three times a day (TID) | ORAL | Status: DC
  Administered 2019-03-15 – 2019-03-17 (×8): 8 ug via ORAL
  Filled 2019-03-15 (×8): qty 1

## 2019-03-15 NOTE — Evaluation (Addendum)
Physical Therapy Evaluation Patient Details Name: [name] MRN: [id number] DOB: 1920/04/10 Today's Date: 03/15/2019   History of Present Illness  86 y.o. female and admitted for SBO. She has PMH significant for recurrent small bowel obstructions, hypothyroid, paroxysmal atrial fibrillation, prior history of pulmonary embolism with IVC filter placement, CKD, chronic diastolic CHF, spinal stenosis with multiple back surgeries, bil TKA, and is legally blind.    Clinical Impression  [name] is 86 y.o. female admitted with above HPI and diagnosis. Patient is currently limited by functional impairments below (see PT problem list). Patient lives alone but her family is there providing 24/7 assist/care. She requires assistance for short household ambulation with rollator and assist for all ADL's at baseline. Patient will benefit from continued skilled PT interventions to address impairments and progress independence with mobility, recommending HHPT with ongoing 24/7 assist from family. Acute PT will follow and progress as able.     Follow Up Recommendations Home health PT;Supervision/Assistance - 24 hour    Equipment Recommendations  None recommended by PT    Recommendations for Other Services       Precautions / Restrictions Precautions Precautions: Fall Restrictions Weight Bearing Restrictions: No      Mobility  Bed Mobility      General bed mobility comments: pt OOB in chair and ended in chair  Transfers Overall transfer level: Needs assistance Equipment used: Rolling walker (2 wheeled) Transfers: Sit to/from Stand Sit to Stand: +2 safety/equipment;From elevated surface         General transfer comment: pillows on chair for comofort and to raise up. cues for safe hand placement to initiate power up wtih bil UE's pushign up from arm rest. min-mod assist required to complete rise to stand. pt performed 2x from recliner.  Ambulation/Gait Ambulation/Gait assistance: Min  assist;+2 safety/equipment Gait Distance (Feet): 20 Feet Assistive device: Rolling walker (2 wheeled) Gait Pattern/deviations: Step-to pattern;Decreased stride length;Narrow base of support;Trunk flexed;Shuffle Gait velocity: slow   General Gait Details: cues for safe hand placement and proximity to RW, pt with significant flexed posture due to spinal curvature.  Stairs            Wheelchair Mobility    Modified Rankin (Stroke Patients Only)       Balance Overall balance assessment: Needs assistance   Sitting balance-Leahy Scale: Fair     Standing balance support: During functional activity;Bilateral upper extremity supported Standing balance-Leahy Scale: Poor              Pertinent Vitals/Pain Pain Assessment: Faces Faces Pain Scale: No hurt    Home Living Family/patient expects to be discharged to:: Private residence Living Arrangements: Children Available Help at Discharge: Family;Available 24 hours/day;Personal care attendant Type of Home: House Home Access: Stairs to enter     Home Layout: Able to live on main level with bedroom/bathroom Home Equipment: Bedside commode;Walker - 4 wheels;Shower seat;Grab bars - tub/shower;Toilet riser(padded seat on toilet to elevate it some) Additional Comments: pt walks short distances with rollator between rooms in home, requries assist from family to mobilize.    Prior Function Level of Independence: Needs assistance   Gait / Transfers Assistance Needed: pt requires assistance from family for bed mob, transfers, and gait with rollator for household distances. pt's daughter reports she only sleeps on her Rt side but has been experiencing pain at her hip bone.  ADL's / Homemaking Assistance Needed: pt's family has been assisting with ADLs for ~ 1year due to back pain, hip pain,  and gradual increase in weakness. family assists with bathing and dressing.        Hand Dominance   Dominant Hand: Right     Extremity/Trunk Assessment   Upper Extremity Assessment Upper Extremity Assessment: Generalized weakness    Lower Extremity Assessment Lower Extremity Assessment: Generalized weakness    Cervical / Trunk Assessment Cervical / Trunk Assessment: Kyphotic;Other exceptions Cervical / Trunk Exceptions: pt with scoliotic curvature  Communication      Cognition Arousal/Alertness: Awake/alert Behavior During Therapy: WFL for tasks assessed/performed Overall Cognitive Status: Within Functional Limits for tasks assessed          General Comments: pt is alert and oriented to self, situation, place             Assessment/Plan    PT Assessment Patient needs continued PT services  PT Problem List Decreased strength;Decreased balance;Decreased mobility;Decreased activity tolerance       PT Treatment Interventions DME instruction;Functional mobility training;Balance training;Patient/family education;Gait training;Therapeutic activities;Therapeutic exercise    PT Goals (Current goals can be found in the Care Plan section)  Acute Rehab PT Goals Patient Stated Goal: get back home PT Goal Formulation: With patient Time For Goal Achievement: 03/29/19 Potential to Achieve Goals: Good    Frequency Min 3X/week       AM-PAC PT "6 Clicks" Mobility  Outcome Measure Help needed turning from your back to your side while in a flat bed without using bedrails?: A Little Help needed moving from lying on your back to sitting on the side of a flat bed without using bedrails?: A Little Help needed moving to and from a bed to a chair (including a wheelchair)?: A Lot Help needed standing up from a chair using your arms (e.g., wheelchair or bedside chair)?: A Lot Help needed to walk in hospital room?: A Little Help needed climbing 3-5 steps with a railing? : Total 6 Click Score: 14    End of Session Equipment Utilized During Treatment: Gait belt Activity Tolerance: Patient tolerated treatment  well Patient left: with call bell/phone within reach;in chair;with family/visitor present;with chair alarm set Nurse Communication: Mobility status PT Visit Diagnosis: Muscle weakness (generalized) (M62.81);Unsteadiness on feet (R26.81);Difficulty in walking, not elsewhere classified (R26.2)    Time:  -      Charges:   PT Evaluation $PT Eval Low Complexity: 1 Low PT Treatments $Gait Training: 8-22 mins        Verner Mould, DPT Physical Therapist with [facility] [phone]  03/15/2019 6:58 PM

## 2019-03-15 NOTE — H&P (Signed)
History and Physical    [name] KCL:[id number] DOB: 1920/03/23 DOA: 03/14/2019  PCP: Antony Contras, MD (Confirm with patient/family/NH records and if not entered, this has to be entered at [facility] point of entry)  Patient coming from: Home  I have personally briefly reviewed patient's old medical records in [place]  Chief Complaint: Nausea, vomiting and abdominal pain  HPI: [name] is a 86 y.o. female with medical history significant of CKD 3, paroxysmal atrial fibrillation on Eliquis, prior DVTs s/p IVC filter, CHF, previous abdominal surgeries and bowel obstructions managed without surgeries presented to ED for evaluation of nausea and vomiting.  Patient's daughter is also present at the bedside.  Patient states that she had multiple episodes of vomiting since last night.  She was unable to keep anything down because of that vomiting.  She was given Zofran at home that helped with vomiting.  There was also associated abdominal pain.  Patient's daughter reported that there was a big concern for dehydration that is why she is brought to the hospital.  Patient also had a bowel movement today.  Patient otherwise denies fever, chills, hematemesis, melena, blood in the stool, dysuria, chest pain and dyspnea.    ED Course: On arrival to ED patient had temperature of 98.1, blood pressure 130/56, heart rate 65, respiratory rate 15 and oxygen saturation 98% on room air.  Blood work showed WBC of 14.2, creatinine 1.15.  UA was negative.  Chest x-ray showed right base air disease most likely secondary to aspiration.  CT abdomen/pelvis showed high-grade partial small bowel obstruction.  In the ED patient was managed with IV fluids, Zofran and started on IV Zosyn to cover for aspiration pneumonia.  General surgery was contacted by ED physician and they recommended NG tube and medical management at this time.  NOTE: Patient is a DNR and daughter is present at bedside.  The daughter also told me  that patient is a DNR and conversation is witnessed by the ER nurse.  Patient also stated that she does not want abdominal surgery if needed and only needs medical management.  Review of Systems: As per HPI otherwise 10 point review of systems negative.   Past Medical History:  Diagnosis Date  . Blindness   . Blood clot associated with vein wall inflammation   . CKD (chronic kidney disease), stage III   . Colitis   . Goiter    s/p radioactive iod. tx  . Hypothyroidism   . On amiodarone therapy   . Paroxysmal atrial fibrillation (HCC)   . Pulmonary embolus (HCC)    and DVT s/p IVC filter  . SBO (small bowel obstruction) ([place])   . Spinal stenosis of lumbar region with radiculopathy    possible neurogenic claudication recently?  . Temporal arteritis [facility])     Past Surgical History:  Procedure Laterality Date  . APPENDECTOMY  1940  . BACK SURGERY  04/1998   spinal stenosis  . BACK SURGERY  2000   Ruptured Disc  . BREAST BIOPSY     cysts in both breasts  . CARPAL TUNNEL RELEASE  2002  . ivc filter    . PARTIAL HYSTERECTOMY    . TONSILLECTOMY  1934  . TOTAL KNEE ARTHROPLASTY Left 04/1999  . TOTAL KNEE ARTHROPLASTY Right 02/2000     reports that she has never smoked. She has never used smokeless tobacco. She reports that she does not drink alcohol or use drugs.  Allergies  Allergen Reactions  .  Hydrocortisone Other (See Comments)  . Sulfa Antibiotics Nausea Only  . Cefdinir Other (See Comments)    Caused bad diarrhea Caused bad diarrhea Caused bad diarrhea Caused bad diarrhea Caused bad diarrhea  . Cortisone Other (See Comments)    Flushed hands and face  Flushed hands and face Flushed hands and face  Flushed hands and face Flushed hands and face  . Prednisone Other (See Comments)    cuased blood clots Can have very small dose cuased blood clots cuased blood clots Can have very small dose cuased blood clots cuased blood clots  . Sulfasalazine Other (See  Comments)    Nausea side effects Stomach pains and cramping Nausea side effects Stomach pains and cramping Stomach pains and cramping  . Sulfonamide Derivatives     Stomach pains and cramping     Family History  Problem Relation Age of Onset  . Breast cancer Sister   . Congestive Heart Failure Mother   . Stroke Father   . Crohn's disease Neg Hx   . Ulcerative colitis Neg Hx      Prior to Admission medications   Medication Sig Start Date End Date Taking? Authorizing Provider  acetaminophen (TYLENOL) 500 MG tablet Take 500-1,000 mg by mouth See admin instructions. Take 500mg  in the morning, 500mg  midday and 1000mg  at bedtime.   Yes [provider]  amiodarone (PACERONE) 200 MG tablet Take 1 tablet (200 mg total) by mouth daily. 09/11/18  Yes Belva Crome, MD  apixaban (ELIQUIS) 2.5 MG TABS tablet Take 1 tablet (2.5 mg total) by mouth 2 (two) times daily. 04/16/18  Yes Belva Crome, MD  ASPERCREME LIDOCAINE EX Apply 1 application topically as needed (muscle pain).   Yes [provider]  Calcium Carbonate-Vitamin D (CALCIUM HIGH POTENCY/VITAMIN D) 600-200 MG-UNIT TABS Take 1 tablet by mouth in the morning and at bedtime.    Yes [provider]  CRANBERRY EXTRACT PO Take 2 tablets by mouth 2 (two) times daily.    Yes [provider]  gabapentin (NEURONTIN) 100 MG capsule Take 100 mg by mouth 4 (four) times daily.    Yes [provider]  levothyroxine (SYNTHROID, LEVOTHROID) 150 MCG tablet Take 150 mcg by mouth daily before breakfast.   Yes [provider]  lubiprostone (AMITIZA) 8 MCG capsule Take 8 mcg by mouth 3 (three) times daily with meals.    Yes [provider]  Misc Natural Products (GLUCOSAMINE CHONDROITIN COMPLX) TABS Take 1 tablet by mouth 2 (two) times daily.    Yes [provider]  Multiple Vitamins-Minerals (OCUVITE ADULT 50+ PO) Take 1 tablet by mouth daily.   Yes [provider]   nitrofurantoin (MACRODANTIN) 50 MG capsule Take 50 mg by mouth daily.   Yes [provider]  nitroGLYCERIN (NITROSTAT) 0.4 MG SL tablet Place 1 tablet (0.4 mg total) under the tongue every 5 (five) minutes as needed for chest pain. 09/07/18 03/14/19 Yes Belva Crome, MD  predniSONE (DELTASONE) 5 MG tablet Take 5 mg by mouth daily.    Yes [provider]  Probiotic Product ([place]) Take 1 tablet by mouth daily.    Yes [provider]  traMADol (ULTRAM) 50 MG tablet Take 50 mg by mouth daily as needed for moderate pain.    Yes [provider]    Physical Exam: Vitals:   03/14/19 1900 03/14/19 2000 03/14/19 2118 03/14/19 2300  BP: (!) 137/49 (!) 131/57 (!) 144/58   Pulse: 66  66 67   Resp: 19 13 16    Temp:   98.6 F (37 C)   TempSrc:   Oral   SpO2: 98% 99% 99%   Weight:    51.4 kg  Height:    5\' 6"  (1.676 m)    Constitutional: NAD, calm, comfortable Vitals:   03/14/19 1900 03/14/19 2000 03/14/19 2118 03/14/19 2300  BP: (!) 137/49 (!) 131/57 (!) 144/58   Pulse: 66 66 67   Resp: 19 13 16    Temp:   98.6 F (37 C)   TempSrc:   Oral   SpO2: 98% 99% 99%   Weight:    51.4 kg  Height:    5\' 6"  (1.676 m)   General: Patient is a very pleasant 86 year old female, laying comfortably in the bed and not in any acute distress. Eyes: PERRL, lids and conjunctivae normal ENMT: Mucous membranes are moist. Posterior pharynx clear of any exudate or lesions.Normal dentition.  Neck: normal, supple, no masses, no thyromegaly Respiratory: Diminished breath sounds in right lower lobe, no wheezing, no crackles. Normal respiratory effort. No accessory muscle use.  Cardiovascular: Regular rate and rhythm, no murmurs / rubs / gallops. No extremity edema. 2+ pedal pulses. No carotid bruits.  Abdomen: Mild upper abdominal tenderness with deep palpation but abdominal is soft and not distended, no masses palpated. No hepatosplenomegaly. Bowel sounds  positive.  Musculoskeletal: no clubbing / cyanosis. No joint deformity upper and lower extremities. Good ROM, no contractures. Normal muscle tone.  Skin: no rashes, lesions, ulcers. No induration Neurologic: Patient is awake alert and oriented to time person and place and was able to answer all the questions appropriately.  CN 2-12 grossly intact. Sensation intact, DTR normal. Strength 5/5 in all 4.  Psychiatric: Normal judgment and insight. Alert and oriented x 3. Normal mood.    Labs on Admission: I have personally reviewed following labs and imaging studies  CBC: Recent Labs  Lab 03/14/19 1632  WBC 14.2*  NEUTROABS 12.3*  HGB 13.5  HCT 40.7  MCV 100.2*  PLT 160   Basic Metabolic Panel: Recent Labs  Lab 03/14/19 1632  NA 130*  K 4.5  CL 90*  CO2 29  GLUCOSE 130*  BUN 23  CREATININE 1.15*  CALCIUM 9.3   GFR: Estimated Creatinine Clearance: 23.2 mL/min (A) (by C-G formula based on SCr of 1.15 mg/dL (H)). Liver Function Tests: Recent Labs  Lab 03/14/19 1632  AST 21  ALT 12  ALKPHOS 46  BILITOT 1.0  PROT 6.0*  ALBUMIN 3.8   Recent Labs  Lab 03/14/19 1632  LIPASE 16   No results for input(s): AMMONIA in the last 168 hours. Coagulation Profile: No results for input(s): INR, PROTIME in the last 168 hours. Cardiac Enzymes: No results for input(s): CKTOTAL, CKMB, CKMBINDEX, TROPONINI in the last 168 hours. BNP (last 3 results) No results for input(s): PROBNP in the last 8760 hours. HbA1C: No results for input(s): HGBA1C in the last 72 hours. CBG: No results for input(s): GLUCAP in the last 168 hours. Lipid Profile: No results for input(s): CHOL, HDL, LDLCALC, TRIG, CHOLHDL, LDLDIRECT in the last 72 hours. Thyroid Function Tests: No results for input(s): TSH, T4TOTAL, FREET4, T3FREE, THYROIDAB in the last 72 hours. Anemia Panel: No results for input(s): VITAMINB12, FOLATE, FERRITIN, TIBC, IRON, RETICCTPCT in the last 72 hours. Urine analysis:    Component  Value Date/Time   COLORURINE YELLOW 03/14/2019 [place] 03/14/2019 1549   LABSPEC 1.014 03/14/2019 1549  PHURINE 7.0 03/14/2019 1549   GLUCOSEU NEGATIVE 03/14/2019 1549   HGBUR NEGATIVE 03/14/2019 1549   BILIRUBINUR NEGATIVE 03/14/2019 1549   KETONESUR NEGATIVE 03/14/2019 1549   PROTEINUR NEGATIVE 03/14/2019 1549   UROBILINOGEN 0.2 01/17/2013 0730   NITRITE POSITIVE (A) 03/14/2019 1549   LEUKOCYTESUR SMALL (A) 03/14/2019 1549    Radiological Exams on Admission: CT Abdomen Pelvis W Contrast  Result Date: 03/14/2019 CLINICAL DATA:  Nausea vomiting. EXAM: CT ABDOMEN AND PELVIS WITH CONTRAST TECHNIQUE: Multidetector CT imaging of the abdomen and pelvis was performed using the standard protocol following bolus administration of intravenous contrast. CONTRAST:  38mL OMNIPAQUE IOHEXOL 300 MG/ML  SOLN COMPARISON:  04/16/2017 FINDINGS: Lower chest: Patchy areas of airspace opacity in the right middle lobe with surrounding ground-glass attenuation, have developed since the study of 11/16/2017. Small pericardial effusion. Signs of cardiac enlargement and coronary artery disease. Ectasia of the descending thoracic aorta showing aneurysmal caliber of 4.7 cm in the distal portion within 1-2 mm of previous measurement. Hepatobiliary: Liver with lobular contours. Patent portal vein. Gallbladder is mildly distended without significant pericholecystic stranding. Mild intrahepatic biliary ductal distension and mild intrahepatic biliary ductal distension. Pancreas: Pancreatic atrophy particularly in the pancreatic head. No signs of ductal dilation. Spleen: Spleen normal size without focal lesion. Adrenals/Urinary Tract: Adrenal glands are normal. Kidneys with smooth contours and symmetric enhancement mild parenchymal loss. Urinary bladder is unremarkable. Stomach/Bowel: Marked distension of centralized bowel loops. Multiple areas of alternating dilation and narrowing more so than on prior studies.  Overall the pattern of dilation and narrowing is similar but the degree of dilation is much worse than on prior studies, small bowel in the central abdomen measuring as large as 4 cm. Decompression of the ileum No current signs of pneumatosis. ?Paper thin? wall of the bowel in the central abdomen that shows both in bound and out bound sites of narrowing. Vascular/Lymphatic: Marked calcific and noncalcific atherosclerotic changes in the abdominal aorta. No signs of adenopathy. IVC filter remaining in place in the mid IVC below the level of the renal veins. No signs of pelvic adenopathy. Reproductive: Uterus is unremarkable. No adnexal mass. Other: Signs of pelvic ascites and subtle areas of scattered interloop fluid and mesenteric edema. Musculoskeletal: No signs of acute bone finding or evidence of destructive bone process. Osteopenia and spinal degenerative change. IMPRESSION: 1. Marked distension of bowel loops with multiple areas of alternating dilation and narrowing. The degree of dilation is much worse than on prior studies. No current signs of pneumatosis. Findings are concerning for high-grade partial small bowel obstruction. Some areas with paper thin bowel raising the question of early ischemia, correlate with lactate. 2. Multiple adhesions favored as the etiology, pattern could also be seen in the setting of NSAID enteropathy. Some areas show (in bound side of narrowing seen on image 54 of series 5 and out bound area of narrowing seen on image 33 of series 5) in the low abdomen leading into a dilated segment. Out bound site of narrowing on image closed loop configuration, centralized more dilated loops of bowel for instance in the mid abdomen show areas of narrowing leading into dilated segment and areas of narrowing leading out of the dilated bowel. Surgical consultation may be warranted. 3. Pelvic ascites and mesenteric edema. 4. Signs of presumed pneumonia in the right middle lobe. Attention on  follow-up to ensure resolution. 5. Aneurysmal dilation of the thoracic aorta similar to prior study. 6. Mild intra and extrahepatic biliary ductal distension and gallbladder distension is  nonspecific, correlate with laboratory values. 7. Cardiomegaly and signs of coronary artery disease. These results were called by telephone at the time of interpretation on 03/14/2019 at 6:25 pm to provider Dr. Wyvonnia Dusky, Who verbally acknowledged these results. Aortic Atherosclerosis (ICD10-I70.0). Electronically Signed   By: Zetta Bills M.D.   On: 03/14/2019 18:26   DG Chest Portable 1 View  Result Date: 03/14/2019 CLINICAL DATA:  Vomiting. Known small bowel obstruction. Shortness of breath. History of pulmonary embolism. EXAM: PORTABLE CHEST 1 VIEW COMPARISON:  09/12/2018 FINDINGS: Two AP portable radiographs. Midline trachea. Normal heart size. Atherosclerosis in the transverse aorta. No pleural effusion or pneumothorax. Right base airspace disease. Right hemidiaphragm elevation. IMPRESSION: Right base airspace disease, suspicious for infection or aspiration. Electronically Signed   By: Abigail Miyamoto M.D.   On: 03/14/2019 19:11      Assessment/Plan Principal Problem:   Small bowel obstruction (HCC) Continue gentle hydration with IV normal saline. Zofran as needed for nausea and vomiting. IV morphine for pain as needed. General surgery will evaluate the patient and will give further recommendations. Patient denies any nausea, vomiting and abdominal discomfort at this time.  Active Problems:    Aspiration pneumonia (HCC) Chest x-ray showed right lower lobe infiltrate most likely secondary to aspiration from multiple episodes of vomiting. Continue IV Zosyn.  Continue home medications for chronic medical problems  DVT prophylaxis: Eliquis 2.5 mg twice daily Code Status: DNR Family Communication: Patient's daughter is present at the bedside and patient's condition and management is discussed with her in  detail. Disposition Plan: Patient will be discharged to home after the symptoms are completely resolved Consults called: General surgery consulted by ED physician Admission status: Inpatient/MedSurg   Edmonia Lynch MD [facility] Pager [phone]  If 7PM-7AM, please contact night-coverage [email or web address] Password   03/15/2019, 1:53 AM

## 2019-03-15 NOTE — Progress Notes (Signed)
Patient is blind and would like for her daughter to stay in the room. Daughter [name] stated her mother becomes anxious when she is not there, especially at night.

## 2019-03-15 NOTE — Progress Notes (Signed)
[facility]                                                                              Patient Demographics  [name], is a 86 y.o. female, DOB - 1921/01/01, QIW:[id number]  Admit date - 03/14/2019   Admitting Physician Edmonia Lynch, DO  Outpatient Primary MD for the patient is Antony Contras, MD  Outpatient specialists:   LOS - 1  days   Medical records reviewed and are as summarized below:    Chief Complaint  Patient presents with  . Emesis       Brief summary   Patient is a 86 year old female with history of CKD stage IIIb, paroxysmal atrial fibrillation on Eliquis, prior DVT status post IVC filter, diastolic CHF, previous abdominal surgeries and bowel obstructions managed without surgeries presented with nausea vomiting and abdominal pain.  Patient's daughter at the bedside reported multiple episodes of vomiting, was unable to keep anything down.  Patient was given Zofran at home that helped with the vomiting.  Patient's daughter was concerned about dehydration and hence patient was brought to the hospital.  She did have a bowel movement on the day of admission.  No fevers or chills, hematemesis hematochezia or melena. In ED, temp 98.1 BP 130/56, heart rate 65, no hypoxia.  WBCs 14.2, creatinine 1.1, UA negative. Chest x-ray showed right basilar airspace disease most likely secondary to aspiration CT abdomen pelvis showed high-grade partial SBO.  General surgery was consulted, recommended NG tube and medical management.   Assessment & Plan    Principal Problem:   Small bowel obstruction (HCC) -No nausea or vomiting at the time of my examination, abdominal pain is improving.  NGT was held off -No BM today, KUB shows no free air, partial decompression of bowel gas pattern, hypoactive bowel sounds -Patient feeling miserable, wants to eat and go home, currently n.p.o.  She has not received her home medications, has chronic pain issues -Awaiting general  surgery recommendations, for now will allow sips of clears with meds to resume her home medications which will make her feel better.  Daughter at the bedside.  Patient was seen by Dr. Watt Climes, GI as well.  Active Problems:    Aspiration pneumonia ([place]) -Chest x-ray showed right lower lobe infiltrates, likely secondary to aspiration from multiple episodes of vomiting -For now continue IV Zosyn, will transition to oral Augmentin once tolerating diet    CKD (chronic kidney disease), stage IIIb (HCC) -Baseline creatinine 1.1, currently 1.0, at baseline    Chronic diastolic heart failure (HCC) -Currently euvolemic, no acute issues -2D echo 08/2018 had shown EF of 55 to 60%.    Atrial fibrillation, paroxysmal (HCC) -Continue amiodarone, on apixaban -Patient and daughter does not want any surgical interventions, confirmed with the patient. - For now continue apixaban  Hypothyroidism Continue Synthroid  Moderate protein calorie malnutrition Estimated body mass index is 18.29 kg/m as calculated from the following:   Height as of this encounter: 5\' 6"  (1.676 m).   Weight as of this encounter: 51.4 kg.  Code Status: DNR DVT Prophylaxis: Apixaban Family Communication: Discussed all imaging results, lab results, explained  to the patient and daughter at the bedside   Disposition Plan: Patient from home with anticipated discharge home once cleared by surgery, tolerating diet.  Currently had no BM, n.p.o., precludes safe discharge home.  Time Spent in minutes 35 minutes  Procedures:  None  Consultants:   General surgery  Antimicrobials:   Anti-infectives (From admission, onward)   Start     Dose/Rate Route Frequency Ordered Stop   03/15/19 0400  piperacillin-tazobactam (ZOSYN) IVPB 3.375 g     3.375 g 12.5 mL/hr over 240 Minutes Intravenous Every 8 hours 03/14/19 2054     03/14/19 1830  piperacillin-tazobactam (ZOSYN) IVPB 3.375 g     3.375 g 100 mL/hr over 30 Minutes Intravenous   Once 03/14/19 1824 03/14/19 1937         Medications  Scheduled Meds: . amiodarone  200 mg Oral Daily  . apixaban  2.5 mg Oral BID  . bisacodyl  10 mg Rectal Once  . gabapentin  100 mg Oral QID  . ketotifen  1 drop Both Eyes BID  . levothyroxine  150 mcg Oral QAC breakfast  . lubiprostone  8 mcg Oral TID WC  . Muscle Rub   Topical BID  . predniSONE  5 mg Oral Daily   Continuous Infusions: . piperacillin-tazobactam (ZOSYN)  IV Stopped (03/15/19 0742)   PRN Meds:.morphine injection, ondansetron **OR** ondansetron (ZOFRAN) IV, traMADol      Subjective:   Salvatrice Morandi was seen and examined today.  Frustrated and miserable.  Once however home medications, eyedrops, asper cream for her legs.  Wants to go home.  No BM since admission.  No ongoing nausea vomiting, abdominal pain is improving.  Objective:   Vitals:   03/14/19 2000 03/14/19 2118 03/14/19 2300 03/15/19 0517  BP: (!) 131/57 (!) 144/58  (!) 144/51  Pulse: 66 67  62  Resp: 13 16  16   Temp:  98.6 F (37 C)  98.5 F (36.9 C)  TempSrc:  Oral  Oral  SpO2: 99% 99%  97%  Weight:   51.4 kg   Height:   5\' 6"  (1.676 m)     Intake/Output Summary (Last 24 hours) at 03/15/2019 1108 Last data filed at 03/15/2019 2542 Gross per 24 hour  Intake 100 ml  Output 550 ml  Net -450 ml     Wt Readings from Last 3 Encounters:  03/14/19 51.4 kg  10/20/18 50.3 kg  09/28/18 50.8 kg     Exam  General: Alert and oriented x 3, frustrated and feeling miserable  Cardiovascular: S1 S2 auscultated, no murmurs, RRR  Respiratory: Diminished breath sounds right lower lobe, no wheezing  Gastrointestinal: Soft, mildly distended, hypoactive bowel sounds  Ext: no pedal edema bilaterally  Neuro: No new deficits   Musculoskeletal: No digital cyanosis, clubbing  Skin: No rashes  Psych: Somewhat anxious   Data Reviewed:  I have personally reviewed following labs and imaging studies  Micro Results Recent Results (from the  past 240 hour(s))  Respiratory Panel by RT PCR (Flu A&B, Covid) - Nasopharyngeal Swab     Status: None   Collection Time: 03/14/19  7:08 PM   Specimen: Nasopharyngeal Swab  Result Value Ref Range Status   SARS Coronavirus 2 by RT PCR NEGATIVE NEGATIVE Final    Comment: (NOTE) SARS-CoV-2 target nucleic acids are NOT DETECTED. The SARS-CoV-2 RNA is generally detectable in upper respiratoy specimens during the acute phase of infection. The lowest concentration of SARS-CoV-2 viral copies this assay can detect is  131 copies/mL. A negative result does not preclude SARS-Cov-2 infection and should not be used as the sole basis for treatment or other patient management decisions. A negative result may occur with  improper specimen collection/handling, submission of specimen other than nasopharyngeal swab, presence of viral mutation(s) within the areas targeted by this assay, and inadequate number of viral copies (<131 copies/mL). A negative result must be combined with clinical observations, patient history, and epidemiological information. The expected result is Negative. Fact Sheet for Patients:  PinkCheek.be Fact Sheet for Healthcare Providers:  GravelBags.it This test is not yet ap proved or cleared by the [place] FDA and  has been authorized for detection and/or diagnosis of SARS-CoV-2 by FDA under an Emergency Use Authorization (EUA). This EUA will remain  in effect (meaning this test can be used) for the duration of the COVID-19 declaration under Section 564(b)(1) of the Act, 21 U.S.C. section 360bbb-3(b)(1), unless the authorization is terminated or revoked sooner.    Influenza A by PCR NEGATIVE NEGATIVE Final   Influenza B by PCR NEGATIVE NEGATIVE Final    Comment: (NOTE) The Xpert Xpress SARS-CoV-2/FLU/RSV assay is intended as an aid in  the diagnosis of influenza from Nasopharyngeal swab specimens and  should not be  used as a sole basis for treatment. Nasal washings and  aspirates are unacceptable for Xpert Xpress SARS-CoV-2/FLU/RSV  testing. Fact Sheet for Patients: PinkCheek.be Fact Sheet for Healthcare Providers: GravelBags.it This test is not yet approved or cleared by the [place] FDA and  has been authorized for detection and/or diagnosis of SARS-CoV-2 by  FDA under an Emergency Use Authorization (EUA). This EUA will remain  in effect (meaning this test can be used) for the duration of the  Covid-19 declaration under Section 564(b)(1) of the Act, 21  U.S.C. section 360bbb-3(b)(1), unless the authorization is  terminated or revoked. Performed at [facility], [place] [address] [place],  [address]     Radiology Reports DG Abd 1 View  Result Date: 03/15/2019 CLINICAL DATA:  Small bowel obstruction. EXAM: ABDOMEN - 1 VIEW COMPARISON:  CT of the abdomen and pelvis 03/14/2019 FINDINGS: Bowel gas pattern is partially decompressed compared to the CT scan. No free air is present. IVC filter is noted. Left lung bases clear. Right hemidiaphragm is elevated. Scoliosis is again noted. Degenerative changes are present in the hips. IMPRESSION: 1. Partial decompression of bowel gas pattern. 2. No free air. Electronically Signed   By: San Morelle M.D.   On: 03/15/2019 05:20   CT Abdomen Pelvis W Contrast  Result Date: 03/14/2019 CLINICAL DATA:  Nausea vomiting. EXAM: CT ABDOMEN AND PELVIS WITH CONTRAST TECHNIQUE: Multidetector CT imaging of the abdomen and pelvis was performed using the standard protocol following bolus administration of intravenous contrast. CONTRAST:  38mL OMNIPAQUE IOHEXOL 300 MG/ML  SOLN COMPARISON:  04/16/2017 FINDINGS: Lower chest: Patchy areas of airspace opacity in the right middle lobe with surrounding ground-glass attenuation, have developed since the study of 11/16/2017. Small pericardial  effusion. Signs of cardiac enlargement and coronary artery disease. Ectasia of the descending thoracic aorta showing aneurysmal caliber of 4.7 cm in the distal portion within 1-2 mm of previous measurement. Hepatobiliary: Liver with lobular contours. Patent portal vein. Gallbladder is mildly distended without significant pericholecystic stranding. Mild intrahepatic biliary ductal distension and mild intrahepatic biliary ductal distension. Pancreas: Pancreatic atrophy particularly in the pancreatic head. No signs of ductal dilation. Spleen: Spleen normal size without focal lesion. Adrenals/Urinary Tract: Adrenal glands are normal. Kidneys  with smooth contours and symmetric enhancement mild parenchymal loss. Urinary bladder is unremarkable. Stomach/Bowel: Marked distension of centralized bowel loops. Multiple areas of alternating dilation and narrowing more so than on prior studies. Overall the pattern of dilation and narrowing is similar but the degree of dilation is much worse than on prior studies, small bowel in the central abdomen measuring as large as 4 cm. Decompression of the ileum No current signs of pneumatosis. ?Paper thin? wall of the bowel in the central abdomen that shows both in bound and out bound sites of narrowing. Vascular/Lymphatic: Marked calcific and noncalcific atherosclerotic changes in the abdominal aorta. No signs of adenopathy. IVC filter remaining in place in the mid IVC below the level of the renal veins. No signs of pelvic adenopathy. Reproductive: Uterus is unremarkable. No adnexal mass. Other: Signs of pelvic ascites and subtle areas of scattered interloop fluid and mesenteric edema. Musculoskeletal: No signs of acute bone finding or evidence of destructive bone process. Osteopenia and spinal degenerative change. IMPRESSION: 1. Marked distension of bowel loops with multiple areas of alternating dilation and narrowing. The degree of dilation is much worse than on prior studies. No  current signs of pneumatosis. Findings are concerning for high-grade partial small bowel obstruction. Some areas with paper thin bowel raising the question of early ischemia, correlate with lactate. 2. Multiple adhesions favored as the etiology, pattern could also be seen in the setting of NSAID enteropathy. Some areas show (in bound side of narrowing seen on image 54 of series 5 and out bound area of narrowing seen on image 33 of series 5) in the low abdomen leading into a dilated segment. Out bound site of narrowing on image closed loop configuration, centralized more dilated loops of bowel for instance in the mid abdomen show areas of narrowing leading into dilated segment and areas of narrowing leading out of the dilated bowel. Surgical consultation may be warranted. 3. Pelvic ascites and mesenteric edema. 4. Signs of presumed pneumonia in the right middle lobe. Attention on follow-up to ensure resolution. 5. Aneurysmal dilation of the thoracic aorta similar to prior study. 6. Mild intra and extrahepatic biliary ductal distension and gallbladder distension is nonspecific, correlate with laboratory values. 7. Cardiomegaly and signs of coronary artery disease. These results were called by telephone at the time of interpretation on 03/14/2019 at 6:25 pm to provider Dr. Wyvonnia Dusky, Who verbally acknowledged these results. Aortic Atherosclerosis (ICD10-I70.0). Electronically Signed   By: Zetta Bills M.D.   On: 03/14/2019 18:26   DG Chest Portable 1 View  Result Date: 03/14/2019 CLINICAL DATA:  Vomiting. Known small bowel obstruction. Shortness of breath. History of pulmonary embolism. EXAM: PORTABLE CHEST 1 VIEW COMPARISON:  09/12/2018 FINDINGS: Two AP portable radiographs. Midline trachea. Normal heart size. Atherosclerosis in the transverse aorta. No pleural effusion or pneumothorax. Right base airspace disease. Right hemidiaphragm elevation. IMPRESSION: Right base airspace disease, suspicious for infection or  aspiration. Electronically Signed   By: Abigail Miyamoto M.D.   On: 03/14/2019 19:11    Lab Data:  CBC: Recent Labs  Lab 03/14/19 1632 03/15/19 0433  WBC 14.2* 9.3  NEUTROABS 12.3*  --   HGB 13.5 12.5  HCT 40.7 37.1  MCV 100.2* 101.1*  PLT 251 017   Basic Metabolic Panel: Recent Labs  Lab 03/14/19 1632 03/15/19 0433  NA 130* 133*  K 4.5 4.2  CL 90* 98  CO2 29 25  GLUCOSE 130* 85  BUN 23 18  CREATININE 1.15* 1.05*  CALCIUM 9.3 8.6*  GFR: Estimated Creatinine Clearance: 25.4 mL/min (A) (by C-G formula based on SCr of 1.05 mg/dL (H)). Liver Function Tests: Recent Labs  Lab 03/14/19 1632 03/15/19 0433  AST 21 16  ALT 12 12  ALKPHOS 46 38  BILITOT 1.0 1.0  PROT 6.0* 5.2*  ALBUMIN 3.8 3.2*   Recent Labs  Lab 03/14/19 1632  LIPASE 16   No results for input(s): AMMONIA in the last 168 hours. Coagulation Profile: No results for input(s): INR, PROTIME in the last 168 hours. Cardiac Enzymes: No results for input(s): CKTOTAL, CKMB, CKMBINDEX, TROPONINI in the last 168 hours. BNP (last 3 results) No results for input(s): PROBNP in the last 8760 hours. HbA1C: No results for input(s): HGBA1C in the last 72 hours. CBG: No results for input(s): GLUCAP in the last 168 hours. Lipid Profile: No results for input(s): CHOL, HDL, LDLCALC, TRIG, CHOLHDL, LDLDIRECT in the last 72 hours. Thyroid Function Tests: No results for input(s): TSH, T4TOTAL, FREET4, T3FREE, THYROIDAB in the last 72 hours. Anemia Panel: No results for input(s): VITAMINB12, FOLATE, FERRITIN, TIBC, IRON, RETICCTPCT in the last 72 hours. Urine analysis:    Component Value Date/Time   COLORURINE YELLOW 03/14/2019 1549   APPEARANCEUR CLEAR 03/14/2019 1549   LABSPEC 1.014 03/14/2019 1549   PHURINE 7.0 03/14/2019 1549   GLUCOSEU NEGATIVE 03/14/2019 1549   HGBUR NEGATIVE 03/14/2019 1549   BILIRUBINUR NEGATIVE 03/14/2019 1549   KETONESUR NEGATIVE 03/14/2019 1549   PROTEINUR NEGATIVE 03/14/2019 1549    UROBILINOGEN 0.2 01/17/2013 0730   NITRITE POSITIVE (A) 03/14/2019 1549   LEUKOCYTESUR SMALL (A) 03/14/2019 1549     Jamarii Banks M.D. [facility] 03/15/2019, 11:08 AM   Call night coverage person covering after 7pm

## 2019-03-15 NOTE — Progress Notes (Signed)
    CC: Nausea, vomiting, abdominal pain  Subjective: She is sitting up in the chair, her abdomen does not hurt.  No further vomiting, but she is not passing any flatus, no BM.  She wants to go home.  She needs assistance ambulating at home.  They have developed that these to help her ambulate.  Objective: Vital signs in last 24 hours: Temp:  [98.1 F (36.7 C)-98.6 F (37 C)] 98.5 F (36.9 C) (03/01 0517) Pulse Rate:  [62-67] 62 (03/01 0517) Resp:  [13-21] 16 (03/01 0517) BP: (130-148)/(49-58) 144/51 (03/01 0517) SpO2:  [97 %-100 %] 97 % (03/01 0517) Weight:  [51.4 kg] 51.4 kg (02/28 2300)  N.p.o. yesterday/200 so far today p.o. 50 IV recorded 500 urine recorded No BM ordered Afebrile vital signs are stable  NA 133, potassium 4.2, creatinine 1.5, WBC 9.3, hemoglobin 12.5, hematocrit 37.1.0 Film shows partial decompression of the gas pattern.  No free air.  Intake/Output from previous day: 02/28 0701 - 03/01 0700 In: 50 [IV Piggyback:50] Out: 500 [Urine:500] Intake/Output this shift: Total I/O In: 250 [P.O.:200; IV Piggyback:50] Out: 50 [Urine:50]  General appearance: alert, cooperative and no distress Resp: clear to auscultation bilaterally and Anterior exam, not on oxygen. GI: Abdomen soft, minimally distended.  Some bowel sounds present.  No flatus or BM so far.  Lab Results:  Recent Labs    03/14/19 1632 03/15/19 0433  WBC 14.2* 9.3  HGB 13.5 12.5  HCT 40.7 37.1  PLT 251 227    BMET Recent Labs    03/14/19 1632 03/15/19 0433  NA 130* 133*  K 4.5 4.2  CL 90* 98  CO2 29 25  GLUCOSE 130* 85  BUN 23 18  CREATININE 1.15* 1.05*  CALCIUM 9.3 8.6*   PT/INR No results for input(s): LABPROT, INR in the last 72 hours.  Recent Labs  Lab 03/14/19 1632 03/15/19 0433  AST 21 16  ALT 12 12  ALKPHOS 46 38  BILITOT 1.0 1.0  PROT 6.0* 5.2*  ALBUMIN 3.8 3.2*     Lipase     Component Value Date/Time   LIPASE 16 03/14/2019 1632     Medications: .  amiodarone  200 mg Oral Daily  . apixaban  2.5 mg Oral BID  . artificial tears   Both Eyes QHS  . bisacodyl  10 mg Rectal Once  . gabapentin  100 mg Oral QID  . ketotifen  1 drop Both Eyes BID  . levothyroxine  150 mcg Oral Q0600  . lubiprostone  8 mcg Oral TID WC  . Muscle Rub   Topical BID  . polyvinyl alcohol  1 drop Both Eyes TID  . predniSONE  5 mg Oral Q breakfast   . piperacillin-tazobactam (ZOSYN)  IV Stopped (03/15/19 0973)    Assessment/Plan Aspiration pneumonia -on Zosyn CKD stage III PAF on Eliquis Hx DVTs/PE  - s/p IVC filter Hx CHF Hx of goiter/hypothyroidism DNR   Recurrent SBO Appendectomy/partial hysterectomy  FEN:/N.p.o. -Home medicines restarted ID: Zosyn 2/28 DVT: Apixaban Follow-up: TBD  Plan: We have restarted her p.o. medicines, I give her some sips of clears and some ice chips also and see how she does.  Asked PT to see and help mobilize.  See if we get her rocking chair to help with mobilization.  Recheck film in a.m. DC wick she needs a bedside commode and to be mobilized   LOS: 1 day    [name] 03/15/2019 Please see Amion

## 2019-03-15 NOTE — Progress Notes (Signed)
[name] 9:42 AM  Subjective: Patient well-known to me from years of outpatient care and case discussed on Sunday with her daughter and she is currently doing okay in the hospital and her main complaint is concerns about not taking her medicine and her left hip and leg pain but she also has some right lower quadrant discomfort but much better than it was at home and she has not had any further nausea or vomiting and no bowel movement since she has been here no other new complaints  Objective: Vital signs stable afebrile no acute distress abdomen is little tender in the right lower quadrant positive bowel sounds no rebound tenderness very minimal right lower quadrant guarding only white count decreased to [name] is okay x-ray reviewed better than previous CT but still some air  Assessment: Significant adhesional disease with partial small bowel obstructions  Plan: Await surgical team's opinion but probably okay to try sips of clear liquids and her home oral medicines which will probably make her happy and I will check on tomorrow  [facility]  office [phone] After 5PM or if no answer call [phone]

## 2019-03-16 ENCOUNTER — Inpatient Hospital Stay (HOSPITAL_COMMUNITY): Payer: Medicare Other

## 2019-03-16 ENCOUNTER — Telehealth: Payer: Self-pay | Admitting: Interventional Cardiology

## 2019-03-16 DIAGNOSIS — I5032 Chronic diastolic (congestive) heart failure: Secondary | ICD-10-CM

## 2019-03-16 DIAGNOSIS — N184 Chronic kidney disease, stage 4 (severe): Secondary | ICD-10-CM

## 2019-03-16 DIAGNOSIS — I482 Chronic atrial fibrillation, unspecified: Secondary | ICD-10-CM

## 2019-03-16 LAB — BASIC METABOLIC PANEL
Anion gap: 9 (ref 5–15)
BUN: 24 mg/dL — ABNORMAL HIGH (ref 8–23)
CO2: 26 mmol/L (ref 22–32)
Calcium: 8.3 mg/dL — ABNORMAL LOW (ref 8.9–10.3)
Chloride: 97 mmol/L — ABNORMAL LOW (ref 98–111)
Creatinine, Ser: 1.25 mg/dL — ABNORMAL HIGH (ref 0.44–1.00)
GFR calc Af Amer: 42 mL/min — ABNORMAL LOW (ref >60)
GFR calc non Af Amer: 36 mL/min — ABNORMAL LOW (ref >60)
Glucose, Bld: 84 mg/dL (ref 70–99)
Potassium: 4 mmol/L (ref 3.5–5.1)
Sodium: 132 mmol/L — ABNORMAL LOW (ref 135–145)

## 2019-03-16 LAB — CBC
HCT: 34.3 % — ABNORMAL LOW (ref 36.0–46.0)
Hemoglobin: 11.2 g/dL — ABNORMAL LOW (ref 12.0–15.0)
MCH: 33.3 pg (ref 26.0–34.0)
MCHC: 32.7 g/dL (ref 30.0–36.0)
MCV: 102.1 fL — ABNORMAL HIGH (ref 80.0–100.0)
Platelets: 228 10*3/uL (ref 150–400)
RBC: 3.36 MIL/uL — ABNORMAL LOW (ref 3.87–5.11)
RDW: 13.3 % (ref 11.5–15.5)
WBC: 8.9 10*3/uL (ref 4.0–10.5)
nRBC: 0 % (ref 0.0–0.2)

## 2019-03-16 MED ORDER — SODIUM CHLORIDE 0.9 % IV SOLN: INTRAVENOUS | Status: DC

## 2019-03-16 MED ORDER — ENSURE ENLIVE PO LIQD
237.0000 mL | Freq: Two times a day (BID) | ORAL | Status: DC
  Administered 2019-03-16: 237 mL via ORAL

## 2019-03-16 MED ORDER — SODIUM CHLORIDE 0.9 % IV SOLN: Freq: Once | INTRAVENOUS | Status: AC

## 2019-03-16 MED ORDER — DOCUSATE SODIUM 100 MG PO CAPS
100.0000 mg | ORAL_CAPSULE | Freq: Two times a day (BID) | ORAL | Status: DC
  Administered 2019-03-16 – 2019-03-17 (×3): 100 mg via ORAL
  Filled 2019-03-16 (×3): qty 1

## 2019-03-16 MED ORDER — POLYETHYLENE GLYCOL 3350 17 G PO PACK
17.0000 g | PACK | Freq: Every day | ORAL | Status: DC
  Administered 2019-03-16 – 2019-03-17 (×2): 17 g via ORAL
  Filled 2019-03-16 (×2): qty 1

## 2019-03-16 MED ORDER — ADULT MULTIVITAMIN W/MINERALS CH
1.0000 | ORAL_TABLET | Freq: Every day | ORAL | Status: DC
  Administered 2019-03-16 – 2019-03-17 (×2): 1 via ORAL
  Filled 2019-03-16 (×2): qty 1

## 2019-03-16 MED ORDER — BOOST / RESOURCE BREEZE PO LIQD CUSTOM
1.0000 | Freq: Two times a day (BID) | ORAL | Status: DC
  Administered 2019-03-16 – 2019-03-17 (×2): 1 via ORAL

## 2019-03-16 NOTE — Progress Notes (Signed)
Maybree P Arther 11:32 AM  Subjective: Patient doing okay with a little right lower quadrant discomfort and fullness and also tolerating clear liquids and no new complaints and wants to go home and her case discussed with her family as well and all of their questions were answered including a discussion about recurrent obstructions possible problems at home etc.  Objective: Vital signs stable afebrile no acute distress abdomen is soft very minimal right lower quadrant tenderness occasional bowel sounds labs okay White count okay x-ray about the same  Assessment: Partial small bowel obstruction secondary to adhesions probably  Plan: Very slowly advance diet if okay with surgical team and will check on as long as here in the hospital and they know to call me as needed as an outpatient and follow-up as needed  [facility]  office [phone] After 5PM or if no answer call [phone]

## 2019-03-16 NOTE — Progress Notes (Signed)
Patient ID: [name], female   DOB: 1920-06-09, 86 y.o.   MRN: [id number]       Subjective: Patient feels well this morning.  Her abdominal pain has resolved.  She has tolerated sips of liquids from the floor with no nausea.  She is passing a lot flatus.  No other complaints.  ROS: See above, otherwise other systems negative  Objective: Vital signs in last 24 hours: Temp:  [98 F (36.7 C)-98.2 F (36.8 C)] 98.2 F (36.8 C) (03/02 0627) Pulse Rate:  [56-64] 56 (03/02 0627) Resp:  [14-16] 14 (03/02 0627) BP: (96-154)/(55-71) 144/71 (03/02 0627) SpO2:  [95 %-98 %] 97 % (03/02 0627) Last BM Date: 03/14/19(before coming to hospital )  Intake/Output from previous day: 03/01 0701 - 03/02 0700 In: 250 [P.O.:200; IV Piggyback:50] Out: 550 [Urine:550] Intake/Output this shift: No intake/output data recorded.  PE: Gen: NAD, laying in bed Heart: regular Lungs: CTAB Abd: soft, NT, ND, +BS Psych: Alert and cooperative with a normal affect  Lab Results:  Recent Labs    03/15/19 0433 03/16/19 0345  WBC 9.3 8.9  HGB 12.5 11.2*  HCT 37.1 34.3*  PLT 227 228   BMET Recent Labs    03/15/19 0433 03/16/19 0345  NA 133* 132*  K 4.2 4.0  CL 98 97*  CO2 25 26  GLUCOSE 85 84  BUN 18 24*  CREATININE 1.05* 1.25*  CALCIUM 8.6* 8.3*   PT/INR No results for input(s): LABPROT, INR in the last 72 hours. CMP     Component Value Date/Time   NA 132 (L) 03/16/2019 0345   NA 138 04/05/2014 1547   K 4.0 03/16/2019 0345   CL 97 (L) 03/16/2019 0345   CO2 26 03/16/2019 0345   GLUCOSE 84 03/16/2019 0345   BUN 24 (H) 03/16/2019 0345   BUN 32 04/05/2014 1547   CREATININE 1.25 (H) 03/16/2019 0345   CREATININE 1.54 (H) 11/30/2015 1042   CALCIUM 8.3 (L) 03/16/2019 0345   PROT 5.2 (L) 03/15/2019 0433   PROT 6.1 04/05/2014 1547   ALBUMIN 3.2 (L) 03/15/2019 0433   ALBUMIN 4.0 04/05/2014 1547   AST 16 03/15/2019 0433   ALT 12 03/15/2019 0433   ALKPHOS 38 03/15/2019 0433   BILITOT 1.0  03/15/2019 0433   BILITOT 0.3 04/05/2014 1547   GFRNONAA 36 (L) 03/16/2019 0345   GFRAA 42 (L) 03/16/2019 0345   Lipase     Component Value Date/Time   LIPASE 16 03/14/2019 1632       Studies/Results: DG Abd 1 View  Result Date: 03/15/2019 CLINICAL DATA:  Small bowel obstruction. EXAM: ABDOMEN - 1 VIEW COMPARISON:  CT of the abdomen and pelvis 03/14/2019 FINDINGS: Bowel gas pattern is partially decompressed compared to the CT scan. No free air is present. IVC filter is noted. Left lung bases clear. Right hemidiaphragm is elevated. Scoliosis is again noted. Degenerative changes are present in the hips. IMPRESSION: 1. Partial decompression of bowel gas pattern. 2. No free air. Electronically Signed   By: San Morelle M.D.   On: 03/15/2019 05:20   CT Abdomen Pelvis W Contrast  Result Date: 03/14/2019 CLINICAL DATA:  Nausea vomiting. EXAM: CT ABDOMEN AND PELVIS WITH CONTRAST TECHNIQUE: Multidetector CT imaging of the abdomen and pelvis was performed using the standard protocol following bolus administration of intravenous contrast. CONTRAST:  71mL OMNIPAQUE IOHEXOL 300 MG/ML  SOLN COMPARISON:  04/16/2017 FINDINGS: Lower chest: Patchy areas of airspace opacity in the right middle lobe with surrounding ground-glass  attenuation, have developed since the study of 11/16/2017. Small pericardial effusion. Signs of cardiac enlargement and coronary artery disease. Ectasia of the descending thoracic aorta showing aneurysmal caliber of 4.7 cm in the distal portion within 1-2 mm of previous measurement. Hepatobiliary: Liver with lobular contours. Patent portal vein. Gallbladder is mildly distended without significant pericholecystic stranding. Mild intrahepatic biliary ductal distension and mild intrahepatic biliary ductal distension. Pancreas: Pancreatic atrophy particularly in the pancreatic head. No signs of ductal dilation. Spleen: Spleen normal size without focal lesion. Adrenals/Urinary Tract:  Adrenal glands are normal. Kidneys with smooth contours and symmetric enhancement mild parenchymal loss. Urinary bladder is unremarkable. Stomach/Bowel: Marked distension of centralized bowel loops. Multiple areas of alternating dilation and narrowing more so than on prior studies. Overall the pattern of dilation and narrowing is similar but the degree of dilation is much worse than on prior studies, small bowel in the central abdomen measuring as large as 4 cm. Decompression of the ileum No current signs of pneumatosis. ?Paper thin? wall of the bowel in the central abdomen that shows both in bound and out bound sites of narrowing. Vascular/Lymphatic: Marked calcific and noncalcific atherosclerotic changes in the abdominal aorta. No signs of adenopathy. IVC filter remaining in place in the mid IVC below the level of the renal veins. No signs of pelvic adenopathy. Reproductive: Uterus is unremarkable. No adnexal mass. Other: Signs of pelvic ascites and subtle areas of scattered interloop fluid and mesenteric edema. Musculoskeletal: No signs of acute bone finding or evidence of destructive bone process. Osteopenia and spinal degenerative change. IMPRESSION: 1. Marked distension of bowel loops with multiple areas of alternating dilation and narrowing. The degree of dilation is much worse than on prior studies. No current signs of pneumatosis. Findings are concerning for high-grade partial small bowel obstruction. Some areas with paper thin bowel raising the question of early ischemia, correlate with lactate. 2. Multiple adhesions favored as the etiology, pattern could also be seen in the setting of NSAID enteropathy. Some areas show (in bound side of narrowing seen on image 54 of series 5 and out bound area of narrowing seen on image 33 of series 5) in the low abdomen leading into a dilated segment. Out bound site of narrowing on image closed loop configuration, centralized more dilated loops of bowel for instance in  the mid abdomen show areas of narrowing leading into dilated segment and areas of narrowing leading out of the dilated bowel. Surgical consultation may be warranted. 3. Pelvic ascites and mesenteric edema. 4. Signs of presumed pneumonia in the right middle lobe. Attention on follow-up to ensure resolution. 5. Aneurysmal dilation of the thoracic aorta similar to prior study. 6. Mild intra and extrahepatic biliary ductal distension and gallbladder distension is nonspecific, correlate with laboratory values. 7. Cardiomegaly and signs of coronary artery disease. These results were called by telephone at the time of interpretation on 03/14/2019 at 6:25 pm to provider Dr. Wyvonnia Dusky, Who verbally acknowledged these results. Aortic Atherosclerosis (ICD10-I70.0). Electronically Signed   By: Zetta Bills M.D.   On: 03/14/2019 18:26   DG Chest Portable 1 View  Result Date: 03/14/2019 CLINICAL DATA:  Vomiting. Known small bowel obstruction. Shortness of breath. History of pulmonary embolism. EXAM: PORTABLE CHEST 1 VIEW COMPARISON:  09/12/2018 FINDINGS: Two AP portable radiographs. Midline trachea. Normal heart size. Atherosclerosis in the transverse aorta. No pleural effusion or pneumothorax. Right base airspace disease. Right hemidiaphragm elevation. IMPRESSION: Right base airspace disease, suspicious for infection or aspiration. Electronically Signed   By:  Abigail Miyamoto M.D.   On: 03/14/2019 19:11    Anti-infectives: Anti-infectives (From admission, onward)   Start     Dose/Rate Route Frequency Ordered Stop   03/15/19 0400  piperacillin-tazobactam (ZOSYN) IVPB 3.375 g     3.375 g 12.5 mL/hr over 240 Minutes Intravenous Every 8 hours 03/14/19 2054     03/14/19 1830  piperacillin-tazobactam (ZOSYN) IVPB 3.375 g     3.375 g 100 mL/hr over 30 Minutes Intravenous  Once 03/14/19 1824 03/14/19 1937       Assessment/Plan Aspiration pneumonia -on Zosyn CKD stage III PAF on Eliquis Hx DVTs/PE  - s/p IVC filter Hx  CHF Hx of goiter/hypothyroidism DNR   SBO -patient improving, repeat x-ray shows air and stool in colon -given she tolerated liquids yesterday, will give full liquids today -would treat clinically at this point and defer further films unless worsens -mobilize as able -had a long discussion with patient and her daughter regarding plan today and film results. -will follow  FEN: FLD ID: Zosyn 2/28 DVT: Apixaban Follow-up: TBD   LOS: 2 days    Henreitta Cea , [facility] 03/16/2019, 8:50 AM Please see Amion for pager number during day hours 7:00am-4:30pm or 7:00am -11:30am on weekends

## 2019-03-16 NOTE — TOC Initial Note (Signed)
Transition of Care [facility]) - Initial/Assessment Note    Patient Details  Name: [name] MRN: [id number] Date of Birth: [birth year]  Transition of Care [facility]) CM/SW Contact:    Lia Hopping, [place] Phone Number: 03/16/2019, 4:21 PM  Clinical Narrative:               CSW met with patient and her daughter at bedside to discuss [place] needs. Patient daughter reports  [place] agency reached out to them after the patient visit at her primary care office but the services were not arranged. Daughter requested to follow up with [facility]. CSW reached out to the liaison and confirm staff availability. [facility] will follow up with the patient at discharge.  Daughter reports the patient has DME.   Expected Discharge Plan: [place] Barriers to Discharge: No Barriers Identified   Patient Goals and CMS Choice Patient states their goals for this hospitalization and ongoing recovery are:: Per daughter "continue therapy at home." CMS Medicare.gov Compare Post Acute Care list provided to:: Other (Comment Required)(Daughter) Choice offered to / list presented to : Adult Children  Expected Discharge Plan and Services Expected Discharge Plan: [place] In-house Referral: Clinical Social Work   Post Acute Care Choice: [place] arrangements for the past 2 months: [place]: PT [place]: [place] ([place]) Date [place]: 03/16/19 Time [place]: 1620 Representative spoke with at [place]: Santiago Glad  Prior Living Arrangements/Services Living arrangements for the past 2 months: [place] with:: Adult Children, Self Patient language and need for interpreter reviewed:: No Do you feel safe going back to the place where you live?: Yes      Need for Family Participation in Patient Care: Yes (Comment) Care giver support system in place?: Yes  (comment) Current home services: DME Criminal Activity/Legal Involvement Pertinent to Current Situation/Hospitalization: No - Comment as needed  Activities of Daily Living Home Assistive Devices/Equipment: Hearing aid, Walker (specify type), Wheelchair(rollator) ADL Screening (condition at time of admission) Patient's cognitive ability adequate to safely complete daily activities?: Yes Is the patient deaf or have difficulty hearing?: Yes Does the patient have difficulty seeing, even when wearing glasses/contacts?: Yes(blind) Does the patient have difficulty concentrating, remembering, or making decisions?: No Patient able to express need for assistance with ADLs?: Yes Does the patient have difficulty dressing or bathing?: Yes Independently performs ADLs?: No Communication: Independent Dressing (OT): Needs assistance Is this a change from baseline?: Pre-admission baseline Grooming: Needs assistance Is this a change from baseline?: Pre-admission baseline Feeding: Needs assistance Is this a change from baseline?: Pre-admission baseline Bathing: Needs assistance Is this a change from baseline?: Pre-admission baseline Toileting: Needs assistance Is this a change from baseline?: Pre-admission baseline In/Out Bed: Needs assistance Is this a change from baseline?: Pre-admission baseline Walks in Home: Needs assistance Is this a change from baseline?: Pre-admission baseline Does the patient have difficulty walking or climbing stairs?: Yes Weakness of Legs: Both Weakness of Arms/Hands: Both  Permission Sought/Granted      Share Information with NAME: Wynona Meals  Permission granted to share info w AGENCY: [place] granted to share info w Relationship: Daughter  Permission granted to share info w Contact Information: [phone]  Emotional Assessment Appearance:: Appears stated age Attitude/Demeanor/Rapport:  Engaged Affect (typically observed): Accepting,  Calm Orientation: : Oriented to Self, Oriented to Place, Oriented to  Time, Oriented to Situation Alcohol / Substance Use: Not Applicable Psych Involvement: No (comment)  Admission diagnosis:  Small bowel obstruction (HCC) [K56.609] SBO (small bowel obstruction) ([place]) [K56.609] Patient Active Problem List   Diagnosis Date Noted  . Atrial fibrillation, chronic ([place]) 03/15/2019  . Small bowel obstruction ([place]) 03/14/2019  . UTI (urinary tract infection) 09/13/2018  . Persistent atrial fibrillation ([place]) 09/12/2018  . Hypotension   . Anticoagulated on warfarin 11/23/2016  . Encounter for monitoring amiodarone therapy 07/07/2016  . Mild anemia 11/28/2015  . Chronic diastolic heart failure ([place]) 12/29/2013  . SBO (small bowel obstruction) ([place]) 01/17/2013  . Microscopic colitis 01/17/2013  . Leukocytosis 01/17/2013  . Aspiration pneumonia ([place]) 01/17/2013  . CKD (chronic kidney disease), stage IV ([place])   . SYNCOPE 09/21/2008  . Disorder of thyroid 09/17/2008  . Atrial fibrillation with RVR ([place]) 09/17/2008  . TEMPORAL ARTERITIS 09/17/2008  . Arthropathy 09/17/2008  . DYSPNEA ON EXERTION 09/17/2008   PCP:  Antony Contras, MD Pharmacy:   [facility], [place] [address] Phone: [phone] Fax: [phone]     Social Determinants of Health (SDOH) Interventions    Readmission Risk Interventions No flowsheet data found.

## 2019-03-16 NOTE — Progress Notes (Signed)
Physical Therapy Treatment Patient Details Name: [name] MRN: [id number] DOB: [birth year] Today's Date: 03/16/2019    History of Present Illness 86 y.o. female and admitted for SBO. She has PMH significant for recurrent small bowel obstructions, hypothyroid, paroxysmal atrial fibrillation, prior history of pulmonary embolism with IVC filter placement, CKD, chronic diastolic CHF, spinal stenosis with multiple back surgeries, bil TKA, and is legally blind.    PT Comments    Patient making good progress with acute PT. She was able to progress gait distance with RW today, continues to require assist for RW management and to navigate environment. Pt also instructed in seated exercises for LE strengthening, pt reporting she used to perform similar exercises at home on her own. She will continue to benefit from skilled PT interventions to address impairments and progress mobility as able. Patient and daughter has expressed interest in [place] working on balance, gait, and also addressing her Lt sided neck pain. Acute PT will follow throughout pt's stay.   Follow Up Recommendations  Home health PT;Supervision/Assistance - 24 hour     Equipment Recommendations  None recommended by PT    Recommendations for Other Services       Precautions / Restrictions Precautions Precautions: Fall Restrictions Weight Bearing Restrictions: No    Mobility  Bed Mobility      General bed mobility comments: pt OOB in chair and ended in chair  Transfers Overall transfer level: Needs assistance Equipment used: Rolling walker (2 wheeled) Transfers: Sit to/from Stand Sit to Stand: +2 safety/equipment;From elevated surface         General transfer comment: pillows on chair for comofort and to raise up. cues for safe hand placement to initiate power up wtih bil UE's and verbal cues for guidance ot RW as pt is legally blind. Min assist to rise to RW and steady, pt with flexed posture on  rising.  Ambulation/Gait Ambulation/Gait assistance: Min assist;+2 safety/equipment Gait Distance (Feet): 50 Feet Assistive device: Rolling walker (2 wheeled) Gait Pattern/deviations: Step-to pattern;Decreased stride length;Narrow base of support;Trunk flexed;Shuffle Gait velocity: slow   General Gait Details: pt maintained safe hand placement, assist required throughout for walker management with turn negotiation and navigation in hallway as pt is blind, cues for posture (pt unable to correct due to bak pain). assist to prevent walker from getting too far ahead of patient during gait. pt's knees and hips flexed during stance phase of gait and pt LE's giving into mroe flexion slightly as she fatigued.   Stairs             Wheelchair Mobility    Modified Rankin (Stroke Patients Only)       Balance Overall balance assessment: Needs assistance   Sitting balance-Leahy Scale: Fair     Standing balance support: During functional activity;Bilateral upper extremity supported Standing balance-Leahy Scale: Poor         Cognition Arousal/Alertness: Awake/alert Behavior During Therapy: WFL for tasks assessed/performed Overall Cognitive Status: Within Functional Limits for tasks assessed        General Comments: pt alert and orientedx 4, needed slight cues for day of teh week but able to state month and date.      Exercises General Exercises - Lower Extremity Ankle Circles/Pumps: AROM;Both;Seated;10 reps Long Arc Quad: AROM;10 reps;Seated;Both Hip Flexion/Marching: AROM;Seated;10 reps;Both    General Comments        Pertinent Vitals/Pain Pain Assessment: Faces Faces Pain Scale: No hurt           PT  Goals (current goals can now be found in the care plan section) Acute Rehab PT Goals Patient Stated Goal: get back home PT Goal Formulation: With patient Time For Goal Achievement: 03/29/19 Potential to Achieve Goals: Good Progress towards PT goals: Progressing  toward goals    Frequency    Min 3X/week      PT Plan Current plan remains appropriate       AM-PAC PT "6 Clicks" Mobility   Outcome Measure  Help needed turning from your back to your side while in a flat bed without using bedrails?: A Little Help needed moving from lying on your back to sitting on the side of a flat bed without using bedrails?: A Little Help needed moving to and from a bed to a chair (including a wheelchair)?: A Little Help needed standing up from a chair using your arms (e.g., wheelchair or bedside chair)?: A Little Help needed to walk in hospital room?: A Little Help needed climbing 3-5 steps with a railing? : Total 6 Click Score: 16    End of Session Equipment Utilized During Treatment: Gait belt Activity Tolerance: Patient tolerated treatment well Patient left: with call bell/phone within reach;in chair;with family/visitor present;with chair alarm set Nurse Communication: Mobility status PT Visit Diagnosis: Muscle weakness (generalized) (M62.81);Unsteadiness on feet (R26.81);Difficulty in walking, not elsewhere classified (R26.2)     Time: 7262-0355 PT Time Calculation (min) (ACUTE ONLY): 19 min  Charges:  $Gait Training: 8-22 mins                    Verner Mould, DPT Physical Therapist with [facility] [phone]  03/16/2019 12:02 PM

## 2019-03-16 NOTE — Progress Notes (Signed)
[facility]                                                                              Patient Demographics  [name], is a 86 y.o. female, DOB - 1920/05/19, ACZ:[id number]  Admit date - 03/14/2019   Admitting Physician Edmonia Lynch, DO  Outpatient Primary MD for the patient is Antony Contras, MD  Outpatient specialists:   LOS - 2  days   Medical records reviewed and are as summarized below:    Chief Complaint  Patient presents with  . Emesis       Brief summary   Patient is a 86 year old female with history of CKD stage IIIb, paroxysmal atrial fibrillation on Eliquis, prior DVT status post IVC filter, diastolic CHF, previous abdominal surgeries and bowel obstructions managed without surgeries presented with nausea vomiting and abdominal pain.  Patient's daughter at the bedside reported multiple episodes of vomiting, was unable to keep anything down.  Patient was given Zofran at home that helped with the vomiting.  Patient's daughter was concerned about dehydration and hence patient was brought to the hospital.  She did have a bowel movement on the day of admission.  No fevers or chills, hematemesis hematochezia or melena. In ED, temp 98.1 BP 130/56, heart rate 65, no hypoxia.  WBCs 14.2, creatinine 1.1, UA negative. Chest x-ray showed right basilar airspace disease most likely secondary to aspiration CT abdomen pelvis showed high-grade partial SBO.  General surgery was consulted, recommended NG tube and medical management.   Assessment & Plan    Principal Problem:   Small bowel obstruction ([place]) -Patient had no nausea or vomiting at the time of admission hence NGT was held off. --Per patient passing flatus but no BM yet, tolerating sips of clears -Repeat abdominal x-ray shows gas and stool through the colon, surgery and GI following -Diet advanced to full liquids today  Active Problems:    Aspiration pneumonia ([place]) -Chest x-ray showed right lower  lobe infiltrates, likely secondary to aspiration from multiple episodes of vomiting -Continue IV Zosyn, if tolerating diet, will transition to oral Augmentin    CKD (chronic kidney disease), stage IIIb (HCC) -Baseline creatinine 1.1, currently 1.0, at baseline -Creatinine trended up to 1.2 today, has not had much to eat, will place on IV fluids for 500 cc    Chronic diastolic heart failure (HCC) -Currently euvolemic, no acute issues -2D echo 08/2018 had shown EF of 55 to 60%.    Atrial fibrillation, paroxysmal (HCC) -Continue amiodarone, on apixaban -Patient and daughter does not want any surgical interventions - For now continue apixaban  Hypothyroidism Continue Synthroid  Moderate protein calorie malnutrition Estimated body mass index is 18.29 kg/m as calculated from the following:   Height as of this encounter: 5\' 6"  (1.676 m).   Weight as of this encounter: 51.4 kg.  Code Status: DNR DVT Prophylaxis: Apixaban Family Communication: Discussed all imaging results, lab results, explained to the patient and daughter at the bedside today   Disposition Plan: Patient from home with anticipated discharge home once cleared by surgery, tolerating diet.  Hopefully DC home in a.m. if tolerating diet and  cleared by surgery  Time Spent in minutes 25 mins   Procedures:  None  Consultants:   General surgery Gastroenterology  Antimicrobials:   Anti-infectives (From admission, onward)   Start     Dose/Rate Route Frequency Ordered Stop   03/15/19 0400  piperacillin-tazobactam (ZOSYN) IVPB 3.375 g     3.375 g 12.5 mL/hr over 240 Minutes Intravenous Every 8 hours 03/14/19 2054     03/14/19 1830  piperacillin-tazobactam (ZOSYN) IVPB 3.375 g     3.375 g 100 mL/hr over 30 Minutes Intravenous  Once 03/14/19 1824 03/14/19 1937         Medications  Scheduled Meds: . amiodarone  200 mg Oral Daily  . apixaban  2.5 mg Oral BID  . artificial tears   Both Eyes QHS  . bisacodyl  10 mg  Rectal Once  . gabapentin  100 mg Oral QID  . ketotifen  1 drop Both Eyes BID  . levothyroxine  150 mcg Oral Q0600  . lubiprostone  8 mcg Oral TID WC  . Muscle Rub   Topical BID  . polyethylene glycol  17 g Oral Daily  . polyvinyl alcohol  1 drop Both Eyes TID  . predniSONE  5 mg Oral Q breakfast   Continuous Infusions: . piperacillin-tazobactam (ZOSYN)  IV 3.375 g (03/16/19 0623)   PRN Meds:.morphine injection, ondansetron **OR** ondansetron (ZOFRAN) IV, traMADol      Subjective:   [name] was seen and examined today.  Feeling somewhat better today, has had flatus but no BM yet.  No fevers or chills.  No nausea vomiting, abdominal pain improving.  Objective:   Vitals:   03/15/19 1401 03/15/19 1436 03/15/19 2015 03/16/19 0627  BP: (!) 96/55 (!) 131/58 (!) 154/57 (!) 144/71  Pulse: 62 61 64 (!) 56  Resp: 14  16 14   Temp: 98 F (36.7 C)  98.2 F (36.8 C) 98.2 F (36.8 C)  TempSrc:   Oral Oral  SpO2: 95%  98% 97%  Weight:      Height:        Intake/Output Summary (Last 24 hours) at 03/16/2019 1311 Last data filed at 03/16/2019 1000 Gross per 24 hour  Intake 142.53 ml  Output 550 ml  Net -407.47 ml     Wt Readings from Last 3 Encounters:  03/14/19 51.4 kg  10/20/18 50.3 kg  09/28/18 50.8 kg     Physical Exam  General: Alert and oriented x 3, NAD, appears comfortable  Cardiovascular: S1 S2 clear, RRR. No pedal edema b/l  Respiratory: CTAB, no wheezing, rales or rhonchi  Gastrointestinal: Soft, nontender, nondistended, NBS  Ext: no pedal edema bilaterally  Neuro: no new deficits  Musculoskeletal: No cyanosis, clubbing  Skin: No rashes  Psych: Normal affect and demeanor, alert and oriented x3    Data Reviewed:  I have personally reviewed following labs and imaging studies  Micro Results Recent Results (from the past 240 hour(s))  Respiratory Panel by RT PCR (Flu A&B, Covid) - Nasopharyngeal Swab     Status: None   Collection Time: 03/14/19   7:08 PM   Specimen: Nasopharyngeal Swab  Result Value Ref Range Status   SARS Coronavirus 2 by RT PCR NEGATIVE NEGATIVE Final    Comment: (NOTE) SARS-CoV-2 target nucleic acids are NOT DETECTED. The SARS-CoV-2 RNA is generally detectable in upper respiratoy specimens during the acute phase of infection. The lowest concentration of SARS-CoV-2 viral copies this assay can detect is 131 copies/mL. A negative result does not  preclude SARS-Cov-2 infection and should not be used as the sole basis for treatment or other patient management decisions. A negative result may occur with  improper specimen collection/handling, submission of specimen other than nasopharyngeal swab, presence of viral mutation(s) within the areas targeted by this assay, and inadequate number of viral copies (<131 copies/mL). A negative result must be combined with clinical observations, patient history, and epidemiological information. The expected result is Negative. Fact Sheet for Patients:  PinkCheek.be Fact Sheet for Healthcare Providers:  GravelBags.it This test is not yet ap proved or cleared by the [place] FDA and  has been authorized for detection and/or diagnosis of SARS-CoV-2 by FDA under an Emergency Use Authorization (EUA). This EUA will remain  in effect (meaning this test can be used) for the duration of the COVID-19 declaration under Section 564(b)(1) of the Act, 21 U.S.C. section 360bbb-3(b)(1), unless the authorization is terminated or revoked sooner.    Influenza A by PCR NEGATIVE NEGATIVE Final   Influenza B by PCR NEGATIVE NEGATIVE Final    Comment: (NOTE) The Xpert Xpress SARS-CoV-2/FLU/RSV assay is intended as an aid in  the diagnosis of influenza from Nasopharyngeal swab specimens and  should not be used as a sole basis for treatment. Nasal washings and  aspirates are unacceptable for Xpert Xpress SARS-CoV-2/FLU/RSV   testing. Fact Sheet for Patients: PinkCheek.be Fact Sheet for Healthcare Providers: GravelBags.it This test is not yet approved or cleared by the [place] FDA and  has been authorized for detection and/or diagnosis of SARS-CoV-2 by  FDA under an Emergency Use Authorization (EUA). This EUA will remain  in effect (meaning this test can be used) for the duration of the  Covid-19 declaration under Section 564(b)(1) of the Act, 21  U.S.C. section 360bbb-3(b)(1), unless the authorization is  terminated or revoked. Performed at [facility], [place] [address], [place], [place] [address]     Radiology Reports DG Abd 1 View  Result Date: 03/16/2019 CLINICAL DATA:  Small bowel obstruction EXAM: ABDOMEN - 1 VIEW COMPARISON:  KUB 03/15/2019.  CT 03/14/2019 FINDINGS: Continued mildly prominent small bowel loops in the pelvis, similar prior study. Gas and stool seen throughout the colon. No organomegaly or free air. IVC filter remains in place, unchanged. Leftward scoliosis and degenerative changes in the lumbar spine and hips. No acute bony abnormality. IMPRESSION: Continued mild prominence of small bowel loops in the pelvis, similar to prior study. Electronically Signed   By: Rolm Baptise M.D.   On: 03/16/2019 09:04   DG Abd 1 View  Result Date: 03/15/2019 CLINICAL DATA:  Small bowel obstruction. EXAM: ABDOMEN - 1 VIEW COMPARISON:  CT of the abdomen and pelvis 03/14/2019 FINDINGS: Bowel gas pattern is partially decompressed compared to the CT scan. No free air is present. IVC filter is noted. Left lung bases clear. Right hemidiaphragm is elevated. Scoliosis is again noted. Degenerative changes are present in the hips. IMPRESSION: 1. Partial decompression of bowel gas pattern. 2. No free air. Electronically Signed   By: San Morelle M.D.   On: 03/15/2019 05:20   CT Abdomen Pelvis W Contrast  Result Date:  03/14/2019 CLINICAL DATA:  Nausea vomiting. EXAM: CT ABDOMEN AND PELVIS WITH CONTRAST TECHNIQUE: Multidetector CT imaging of the abdomen and pelvis was performed using the standard protocol following bolus administration of intravenous contrast. CONTRAST:  4mL OMNIPAQUE IOHEXOL 300 MG/ML  SOLN COMPARISON:  04/16/2017 FINDINGS: Lower chest: Patchy areas of airspace opacity in the right middle lobe with surrounding  ground-glass attenuation, have developed since the study of 11/16/2017. Small pericardial effusion. Signs of cardiac enlargement and coronary artery disease. Ectasia of the descending thoracic aorta showing aneurysmal caliber of 4.7 cm in the distal portion within 1-2 mm of previous measurement. Hepatobiliary: Liver with lobular contours. Patent portal vein. Gallbladder is mildly distended without significant pericholecystic stranding. Mild intrahepatic biliary ductal distension and mild intrahepatic biliary ductal distension. Pancreas: Pancreatic atrophy particularly in the pancreatic head. No signs of ductal dilation. Spleen: Spleen normal size without focal lesion. Adrenals/Urinary Tract: Adrenal glands are normal. Kidneys with smooth contours and symmetric enhancement mild parenchymal loss. Urinary bladder is unremarkable. Stomach/Bowel: Marked distension of centralized bowel loops. Multiple areas of alternating dilation and narrowing more so than on prior studies. Overall the pattern of dilation and narrowing is similar but the degree of dilation is much worse than on prior studies, small bowel in the central abdomen measuring as large as 4 cm. Decompression of the ileum No current signs of pneumatosis. ?Paper thin? wall of the bowel in the central abdomen that shows both in bound and out bound sites of narrowing. Vascular/Lymphatic: Marked calcific and noncalcific atherosclerotic changes in the abdominal aorta. No signs of adenopathy. IVC filter remaining in place in the mid IVC below the level of  the renal veins. No signs of pelvic adenopathy. Reproductive: Uterus is unremarkable. No adnexal mass. Other: Signs of pelvic ascites and subtle areas of scattered interloop fluid and mesenteric edema. Musculoskeletal: No signs of acute bone finding or evidence of destructive bone process. Osteopenia and spinal degenerative change. IMPRESSION: 1. Marked distension of bowel loops with multiple areas of alternating dilation and narrowing. The degree of dilation is much worse than on prior studies. No current signs of pneumatosis. Findings are concerning for high-grade partial small bowel obstruction. Some areas with paper thin bowel raising the question of early ischemia, correlate with lactate. 2. Multiple adhesions favored as the etiology, pattern could also be seen in the setting of NSAID enteropathy. Some areas show (in bound side of narrowing seen on image 54 of series 5 and out bound area of narrowing seen on image 33 of series 5) in the low abdomen leading into a dilated segment. Out bound site of narrowing on image closed loop configuration, centralized more dilated loops of bowel for instance in the mid abdomen show areas of narrowing leading into dilated segment and areas of narrowing leading out of the dilated bowel. Surgical consultation may be warranted. 3. Pelvic ascites and mesenteric edema. 4. Signs of presumed pneumonia in the right middle lobe. Attention on follow-up to ensure resolution. 5. Aneurysmal dilation of the thoracic aorta similar to prior study. 6. Mild intra and extrahepatic biliary ductal distension and gallbladder distension is nonspecific, correlate with laboratory values. 7. Cardiomegaly and signs of coronary artery disease. These results were called by telephone at the time of interpretation on 03/14/2019 at 6:25 pm to provider Dr. Wyvonnia Dusky, Who verbally acknowledged these results. Aortic Atherosclerosis (ICD10-I70.0). Electronically Signed   By: Zetta Bills M.D.   On: 03/14/2019  18:26   DG Chest Portable 1 View  Result Date: 03/14/2019 CLINICAL DATA:  Vomiting. Known small bowel obstruction. Shortness of breath. History of pulmonary embolism. EXAM: PORTABLE CHEST 1 VIEW COMPARISON:  09/12/2018 FINDINGS: Two AP portable radiographs. Midline trachea. Normal heart size. Atherosclerosis in the transverse aorta. No pleural effusion or pneumothorax. Right base airspace disease. Right hemidiaphragm elevation. IMPRESSION: Right base airspace disease, suspicious for infection or aspiration. Electronically Signed  By: Abigail Miyamoto M.D.   On: 03/14/2019 19:11    Lab Data:  CBC: Recent Labs  Lab 03/14/19 1632 03/15/19 0433 03/16/19 0345  WBC 14.2* 9.3 8.9  NEUTROABS 12.3*  --   --   HGB 13.5 12.5 11.2*  HCT 40.7 37.1 34.3*  MCV 100.2* 101.1* 102.1*  PLT 251 227 004   Basic Metabolic Panel: Recent Labs  Lab 03/14/19 1632 03/15/19 0433 03/16/19 0345  NA 130* 133* 132*  K 4.5 4.2 4.0  CL 90* 98 97*  CO2 29 25 26   GLUCOSE 130* 85 84  BUN 23 18 24*  CREATININE 1.15* 1.05* 1.25*  CALCIUM 9.3 8.6* 8.3*   GFR: Estimated Creatinine Clearance: 21.4 mL/min (A) (by C-G formula based on SCr of 1.25 mg/dL (H)). Liver Function Tests: Recent Labs  Lab 03/14/19 1632 03/15/19 0433  AST 21 16  ALT 12 12  ALKPHOS 46 38  BILITOT 1.0 1.0  PROT 6.0* 5.2*  ALBUMIN 3.8 3.2*   Recent Labs  Lab 03/14/19 1632  LIPASE 16   No results for input(s): AMMONIA in the last 168 hours. Coagulation Profile: No results for input(s): INR, PROTIME in the last 168 hours. Cardiac Enzymes: No results for input(s): CKTOTAL, CKMB, CKMBINDEX, TROPONINI in the last 168 hours. BNP (last 3 results) No results for input(s): PROBNP in the last 8760 hours. HbA1C: No results for input(s): HGBA1C in the last 72 hours. CBG: No results for input(s): GLUCAP in the last 168 hours. Lipid Profile: No results for input(s): CHOL, HDL, LDLCALC, TRIG, CHOLHDL, LDLDIRECT in the last 72  hours. Thyroid Function Tests: No results for input(s): TSH, T4TOTAL, FREET4, T3FREE, THYROIDAB in the last 72 hours. Anemia Panel: No results for input(s): VITAMINB12, FOLATE, FERRITIN, TIBC, IRON, RETICCTPCT in the last 72 hours. Urine analysis:    Component Value Date/Time   COLORURINE YELLOW 03/14/2019 1549   APPEARANCEUR CLEAR 03/14/2019 1549   LABSPEC 1.014 03/14/2019 1549   PHURINE 7.0 03/14/2019 1549   GLUCOSEU NEGATIVE 03/14/2019 1549   HGBUR NEGATIVE 03/14/2019 1549   BILIRUBINUR NEGATIVE 03/14/2019 1549   KETONESUR NEGATIVE 03/14/2019 1549   PROTEINUR NEGATIVE 03/14/2019 1549   UROBILINOGEN 0.2 01/17/2013 0730   NITRITE POSITIVE (A) 03/14/2019 1549   LEUKOCYTESUR SMALL (A) 03/14/2019 1549     Epifania Littrell M.D. [facility] 03/16/2019, 1:11 PM   Call night coverage person covering after 7pm

## 2019-03-16 NOTE — Telephone Encounter (Signed)
New Message;    [name] called. She wanted Dr [name] to know that pt was admitted to [facility] on 03-14-19 She said they were supposed to be sending her reports to him.

## 2019-03-16 NOTE — Progress Notes (Signed)
Initial Nutrition Assessment  DOCUMENTATION CODES:   Underweight  INTERVENTION:  - will order Boost Breeze BID, each supplement provides 250 kcal and 9 grams of protein. - will order Ensure Enlive BID, each supplement provides 350 kcal and 20 grams of protein. - will order daily multivitamin with minerals.    NUTRITION DIAGNOSIS:   Inadequate oral intake related to acute illness, nausea, vomiting as evidenced by per patient/family report.  GOAL:   Patient will meet greater than or equal to 90% of their needs  MONITOR:   PO intake, Supplement acceptance, Diet advancement, Labs, Weight trends  REASON FOR ASSESSMENT:   Malnutrition Screening Tool  ASSESSMENT:   86 year old female with history of stage 3 CKD, afib on Eliquis, prior DVT s/p IVC filter, CHF, and previous abdominal surgeries and bowel obstructions managed without surgeries. She presented to the ED with N/V and abdominal pain. Daughter reported patient was unable to keep anything down. Zofran at home was helpful. CXR showed R basilar airspace disease thought to be 2/2 aspiration. CT abdomen/pelvis showed partial SBO. General Surgery consulted.  Patient and daughter, who was at bedside, on the phone with another family member at the time of attempted visit yesterday afternoon and patient out of the room at time of attempted visit today. Per chart review, diet advanced from NPO to Zeb today at 0850 with no intakes documented since that time.   Per chart review, weight on 2/28 was 113 lb, weight on 09/06/18 was 125 lb, and weight on 09/10/18 was 111 lb. This would indicate possible 12 lb weight loss (9.6% body weight) in the past 6 months.   Per notes: - SBO--repeat x-ray showed gas and stool throughout the colon - aspiration PNA - moderate protein calorie malnutrition - plan for d/c home when cleared by Surgery, possibly as soon as 3/3 AM   Labs reviewed; Na: 132 mmol/l, Cl: 97 mmol/l, BUN: 24 mg/dl, creatinine: 1.25  mg/dl, Ca: 8.3 mg/dl, GFR: 36 ml/min. Medications reviewed; 100 mg colace BID, 10 mg rectal dulcolax x1 dose 3/1, 150 mcg oral synthroid/day, 1 packet miralax/day, 5 mg deltasone/day.     NUTRITION - FOCUSED PHYSICAL EXAM:  unable to complete at this time.   Diet Order:   Diet Order            Diet full liquid Room service appropriate? Yes; Fluid consistency: Thin  Diet effective now              EDUCATION NEEDS:   Not appropriate for education at this time  Skin:  Skin Assessment: Reviewed RN Assessment  Last BM:  2/28  Height:   Ht Readings from Last 1 Encounters:  03/14/19 5\' 6"  (1.676 m)    Weight:   Wt Readings from Last 1 Encounters:  03/14/19 51.4 kg    Ideal Body Weight:  59.1 kg  BMI:  Body mass index is 18.29 kg/m.  Estimated Nutritional Needs:   Kcal:  1545-1725 kcal  Protein:  60-75 grams  Fluid:  >/= 1.8 L/day     Jarome Matin, MS, RD, LDN, CNSC Inpatient Clinical Dietitian RD pager # available in [facility]  After hours/weekend pager # available in [facility]

## 2019-03-17 LAB — CBC
HCT: 34.2 % — ABNORMAL LOW (ref 36.0–46.0)
Hemoglobin: 11.1 g/dL — ABNORMAL LOW (ref 12.0–15.0)
MCH: 32.6 pg (ref 26.0–34.0)
MCHC: 32.5 g/dL (ref 30.0–36.0)
MCV: 100.3 fL — ABNORMAL HIGH (ref 80.0–100.0)
Platelets: 229 10*3/uL (ref 150–400)
RBC: 3.41 MIL/uL — ABNORMAL LOW (ref 3.87–5.11)
RDW: 12.9 % (ref 11.5–15.5)
WBC: 8.2 10*3/uL (ref 4.0–10.5)
nRBC: 0 % (ref 0.0–0.2)

## 2019-03-17 LAB — BASIC METABOLIC PANEL
Anion gap: 8 (ref 5–15)
BUN: 18 mg/dL (ref 8–23)
CO2: 27 mmol/L (ref 22–32)
Calcium: 8.3 mg/dL — ABNORMAL LOW (ref 8.9–10.3)
Chloride: 97 mmol/L — ABNORMAL LOW (ref 98–111)
Creatinine, Ser: 1.11 mg/dL — ABNORMAL HIGH (ref 0.44–1.00)
GFR calc Af Amer: 49 mL/min — ABNORMAL LOW (ref >60)
GFR calc non Af Amer: 42 mL/min — ABNORMAL LOW (ref >60)
Glucose, Bld: 89 mg/dL (ref 70–99)
Potassium: 3.8 mmol/L (ref 3.5–5.1)
Sodium: 132 mmol/L — ABNORMAL LOW (ref 135–145)

## 2019-03-17 MED ORDER — GLYCERIN (LAXATIVE) 2.1 G RE SUPP
1.0000 | Freq: Once | RECTAL | Status: AC
  Administered 2019-03-17: 1 via RECTAL
  Filled 2019-03-17: qty 1

## 2019-03-17 MED ORDER — POLYETHYLENE GLYCOL 3350 17 G PO PACK: 17.0000 g | PACK | Freq: Two times a day (BID) | ORAL | Status: DC

## 2019-03-17 MED ORDER — AMOXICILLIN-POT CLAVULANATE 875-125 MG PO TABS: 1.0000 | ORAL_TABLET | Freq: Two times a day (BID) | ORAL | 0 refills | Status: AC

## 2019-03-17 NOTE — Discharge Instructions (Signed)
Below is information regarding a low fiber diet.  May choose to do full liquids for a couple of weeks before doing more solid low fiber diet eating.  Low-Fiber Eating Plan Fiber is found in fruits, vegetables, whole grains, and beans. Eating a diet low in fiber helps to reduce how often you have bowel movements and how much you produce during a bowel movement. A low-fiber eating plan may help your digestive system heal if:  You have certain conditions, such as Crohn's disease or diverticulitis.  You recently had radiation therapy on your pelvis or bowel.  You recently had intestinal surgery.  You have a new surgical opening in your abdomen (colostomy or ileostomy).  Your intestine is narrowed (stricture). Your health care provider will determine how long you need to stay on this diet. Your health care provider may recommend that you work with a diet and nutrition specialist (dietitian). What are tips for following this plan? General guidelines  Follow recommendations from your dietitian about how much fiber you should have each day.  Most people on this eating plan should try to eat less than 10 grams (g) of fiber each day. Your daily fiber goal is _________________ g.  Take vitamin and mineral supplements as told by your health care provider or dietitian. Chewable or liquid forms are best when on this eating plan. Reading food labels  Check food labels for the amount of dietary fiber.  Choose foods that have less than 2 grams of fiber in one serving. Cooking  Use white flour and other allowed grains for baking and cooking.  Cook meat using methods that keep it tender, such as braising or poaching.  Cook eggs until the yolk is completely solid.  Cook with healthy oils, such as olive oil or canola oil. Meal planning   Eat 5-6 small meals throughout the day instead of 3 large meals.  If you are lactose intolerant: ? Choose low-lactose dairy foods. ? Do not eat dairy foods,  if told by your dietitian.  Limit fat and oils to less than 8 teaspoons a day.  Eat small portions of desserts. What foods are allowed? The items listed below may not be a complete list. Talk with your dietitian about what dietary choices are best for you. Grains All bread and crackers made with white flour. Waffles, pancakes, and [place] toast. Bagels. Pretzels. Melba toast, zwieback, and matzoh. Cooked and dried cereals that do not contain whole grains, added fiber, seeds, or dried fruit. CornmealDomenick Gong. Hot and cold cereals made with refined corn, wheat, rice, or oats. Plain pasta and noodles. White rice. Vegetables Well-cooked or canned vegetables without skin, seeds, or stems. Cooked potatoes without skins. Vegetable juice. Fruits Soft-cooked or canned fruits without skin and seeds. Peeled ripe banana. Applesauce. Fruit juice without pulp. Meats and other protein foods Ground meat. Tender cuts of meat or poultry. Eggs. Fish, seafood, and shellfish. Smooth nut butters. Tofu. Dairy All milk products and drinks. Lactose-free milks, including rice, soy, and almond milks. Yogurt without fruit, nuts, chocolate, or granola mix-ins. Sour cream. Cottage cheese. Cheese. Beverages Decaf coffee. Fruit and vegetable juices or smoothies (in small amounts, with no pulp or skins, and with fruits from allowed list). Sports drinks. Herbal tea. Fats and oils Olive oil, canola oil, sunflower oil, flaxseed oil, and grapeseed oil. Mayonnaise. Cream cheese. Margarine. Butter. Sweets and desserts Plain cakes and cookies. Cream pies and pies made with allowed fruits. Pudding. Custard. Fruit gelatin. Sherbet. Popsicles. Ice cream without nuts.  Plain hard candy. Honey. Jelly. Molasses. Syrups, including chocolate syrup. Chocolate. Marshmallows. Gumdrops. Seasoning and other foods Bouillon. Broth. Cream soups made from allowed foods. Strained soup. Casseroles made with allowed foods. Ketchup. Mild mustard. Mild  salad dressings. Plain gravies. Vinegar. Spices in moderation. Salt. Sugar. What foods are not allowed? The items listed below may not be a complete list. Talk with your dietitian about what dietary choices are best for you. Grains Whole wheat and whole grain breads and crackers. Multigrain breads and crackers. Rye bread. Whole grain or multigrain cereals. Cereals with nuts, raisins, or coconut. Bran. Coarse wheat cereals. Granola. High-fiber cereals. Cornmeal or corn bread. Whole grain pasta. Wild or brown rice. Quinoa. Popcorn. Buckwheat. Wheat germ. Vegetables Potato skins. Raw or undercooked vegetables. All beans and bean sprouts. Cooked greens. Corn. Peas. Cabbage. Beets. Broccoli. Brussels sprouts. Cauliflower. Mushrooms. Onions. Peppers. Parsnips. Okra. Sauerkraut. Fruit Raw or dried fruit. Berries. Fruit juice with pulp. Prune juice. Meats and other protein foods Tough, fibrous meats with gristle. Fatty meat. Poultry with skin. Fried meat, Sales executive, or fish. Deli or lunch meats. Sausage, bacon, and hot dogs. Nuts and chunky nut butter. Dried peas, beans, and lentils. Dairy Yogurt with fruit, nuts, chocolate, or granola mix-ins. Beverages Caffeinated coffee and teas. Fats and oils Avocado. Coconut. Sweets and desserts Desserts, cookies, or candies that contain nuts or coconut. Dried fruit. Jams and preserves with seeds. Marmalade. Any dessert made with fruits or grains that are not allowed. Seasoning and other foods Corn tortilla chips. Soups made with vegetables or grains that are not allowed. Relish. Horseradish. Angie Fava. Olives. Summary  Most people on a low-fiber eating plan should eat less than 10 grams of fiber a day. Follow recommendations from your dietitian about how much fiber you should have each day.  Always check food labels to see the dietary fiber content of packaged foods. In general, a low-fiber food will have fewer than 2 grams of fiber per serving.  In general, try  to avoid whole grains, raw fruits and vegetables, dried fruit, tough cuts of meat, nuts, and seeds.  Take a vitamin and mineral supplement as told by your health care provider or dietitian. This information is not intended to replace advice given to you by your health care provider. Make sure you discuss any questions you have with your health care provider. Document Revised: 04/24/2018 Document Reviewed: 03/05/2016 Elsevier Patient Education  [place].   Full Liquid Diet A full liquid diet refers to fluids and foods that are liquid or will become liquid at room temperature. This diet should only be used for a short period of time to help you recover from illness or surgery. Your health care provider or dietitian will help you determine when it is safe to eat regular foods. What are tips for following this plan?     Reading food labels  Check food labels of nutrition shakes for the amount of protein. Look for nutrition shakes that have at least 8-10 grams of protein in each serving.  Look for drinks, such as milks and juices, that are "fortified" or "enriched." This means that vitamins and minerals have been added. Shopping  Buy premade nutritional shakes to keep on hand.  To vary your choices, buy different flavors of milks and shakes. Meal planning  Choose flavors and foods that you enjoy.  To make sure you get enough energy from food (calories): ? Eat 3 full liquid meals each day. Have a liquid snack between each meal. ?  Drink 6-8 ounces (177-237 ml) of a nutrition supplement shake with meals or as snacks. ? Add protein powder, powdered milk, milk, or yogurt to shakes to increase the amount of protein.  Drink at least one serving a day of citrus fruit juice or fruit juice that has vitamin C added. General guidelines  Before starting the full-liquid diet, check with your health care provider to know what foods you should avoid. These may include full-fat or high-fiber  liquids.  You may have any liquid or food that becomes a liquid at room temperature. The food is considered a liquid if it can be poured off a spoon at room temperature.  Do not drink alcohol unless approved by your health care provider.  This diet gives you most of the nutrients that you need for energy, but you may not get enough of certain vitamins, minerals, and fiber. Make sure to talk to your health care provider or dietitian about: ? How many calories you need to eat get day. ? How much fluid you should have each day. ? Taking a multivitamin or a nutritional supplement. What foods are allowed? The items listed may not be a complete list. Talk with your dietitian about what dietary choices are best for you. Grains Thin hot cereal, such as cream of wheat. Soft-cooked pasta or rice pured in soup. Vegetables Pulp-free tomato or vegetable juice. Vegetables pured in soup. Fruits Fruit juice without pulp. Strained fruit pures (seeds and skins removed). Meats and other protein foods Beef, chicken, and fish broths. Powdered protein supplements. Dairy Milk and milk-based beverages, including milk shakes and instant breakfast mixes. Smooth yogurt. Pured cottage cheese. Beverages Water. Coffee and tea (caffeinated or decaffeinated). Cocoa. Liquid nutritional supplements. Soft drinks. Nondairy milks, such as almond, coconut, rice, or soy milk. Fats and oils Melted margarine and butter. Cream. Canola, almond, avocado, corn, grapeseed, sunflower, and sesame oils. Gravy. Sweets and desserts Custard. Pudding. Flavored gelatin. Smooth ice cream (without nuts or candy pieces). Sherbet. Popsicles. [place] ice. Pudding pops. Seasoning and other foods Salt and pepper. Spices. Cocoa powder. Vinegar. Ketchup. Yellow mustard. Smooth sauces, such as [place], cheese sauce, or white sauce. Soy sauce. Cream soups. Strained soups. Syrup. Honey. Jelly (without fruit pieces). What foods are not  allowed? The items listed may not be a complete list. Talk with your dietitian about what dietary choices are best for you. Grains Whole grains. Pasta. Rice. Cold cereal. Bread. Crackers. Vegetables All whole fresh, frozen, or canned vegetables. Fruits All whole fresh, frozen, or canned fruits. Meats and other protein foods All cuts of meat, poultry, and fish. Eggs. Tofu and soy protein. Nuts and nut butters. Lunch meat. Sausage. Dairy Hard cheese. Yogurt with fruit chunks. Fats and oils Coconut oil. Palm oil. Lard. Cold butter. Sweets and desserts Ice cream or other frozen desserts that have any solids in them or on top, such as nuts, chocolate chips, and pieces of cookies. Cakes. Cookies. Candy. Seasoning and other foods Stone-ground mustards. Soups with chunks or pieces. Summary  A full liquid diet refers to fluids and foods that are liquid or will become liquid at room temperature.  This diet should only be used for a short period of time to help you recover from illness or surgery. Ask your health care provider or dietitian when it is safe for you to eat regular foods.  To make sure you get enough calories and nutrients, eat 3 meals each day with snacks between. Drink premade nutrition supplement shakes or add  protein powder to homemade shakes. Take a vitamin and mineral supplement as told by your health care provider. This information is not intended to replace advice given to you by your health care provider. Make sure you discuss any questions you have with your health care provider. Document Revised: 03/29/2017 Document Reviewed: 02/14/2016 Elsevier Patient Education  2020 [employer].

## 2019-03-17 NOTE — TOC Progression Note (Signed)
Transition of Care [facility]) - Progression Note    Patient Details  Name: [name] MRN: [id number] Date of Birth: 1920-07-19  Transition of Care [facility]) CM/SW [place], LCSW Phone Number: 03/17/2019, 2:58 PM  Clinical Narrative:    CSW confirm H.H start of care is tomorrow. Daughter notified.  H.H liaison will reach out to the patient daughter with details.    Expected Discharge Plan: [place] Barriers to Discharge: No Barriers Identified  Expected Discharge Plan and Services Expected Discharge Plan: [place] In-house Referral: Clinical Social Work   Post Acute Care Choice: [place] arrangements for the past 2 months: [place]: PT [place]: [place] ([facility]) Date [place]: 03/16/19 Time [place]: 1620 Representative spoke with at [place]: [place] ([place]) Interventions    Readmission Risk Interventions No flowsheet data found.

## 2019-03-17 NOTE — Progress Notes (Signed)
Pharmacy Antibiotic Note  [name] is a 86 y.o. female admitted on 03/14/2019 with small bowel obstruction and probable aspiration pneumonia.  Pharmacy has been consulted for Zosyn dosing.  Patient received Zosyn 3.375 g x 1 dose in ED.  Today, 03/17/19  Day 3 Zosyn   Afebrile  WBC WNL stable  Renal function stable - est CrCl 24  Per 3/2 Md note, plan transition to PO Augmentin when tolerates diet. Note also that patient has been tolerating other PO meds since admission   Plan:  Continue Zosyn 3.375 grams IV q8h (each dose over 4 hours) for now  Would recommend changing Zosyn to Augmentin 500mg  PO BID today 3/3 due to stable labs, taking other PO meds and tolerated some diet 3/2  Follow renal function, any culture results, clinical course.  Height: 5\' 6"  (167.6 cm) Weight: 113 lb 5.1 oz (51.4 kg) IBW/kg (Calculated) : 59.3  Temp (24hrs), Avg:98.6 F (37 C), Min:98.1 F (36.7 C), Max:99.2 F (37.3 C)  Recent Labs  Lab 03/14/19 1632 03/14/19 1908 03/14/19 2217 03/15/19 0433 03/16/19 0345 03/17/19 0420  WBC 14.2*  --   --  9.3 8.9 8.2  CREATININE 1.15*  --   --  1.05* 1.25* 1.11*  LATICACIDVEN  --  1.2 1.5  --   --   --     Estimated Creatinine Clearance: 24.1 mL/min (A) (by C-G formula based on SCr of 1.11 mg/dL (H)).    Allergies  Allergen Reactions  . Hydrocortisone Other (See Comments)  . Sulfa Antibiotics Nausea Only  . Cefdinir Other (See Comments)    Caused bad diarrhea Caused bad diarrhea Caused bad diarrhea Caused bad diarrhea Caused bad diarrhea  . Cortisone Other (See Comments)    Flushed hands and face  Flushed hands and face Flushed hands and face  Flushed hands and face Flushed hands and face  . Prednisone Other (See Comments)    cuased blood clots Can have very small dose cuased blood clots cuased blood clots Can have very small dose cuased blood clots cuased blood clots  . Sulfasalazine Other (See Comments)    Nausea side  effects Stomach pains and cramping Nausea side effects Stomach pains and cramping Stomach pains and cramping  . Sulfonamide Derivatives     Stomach pains and cramping     Antimicrobials this admission: 2/28 Zosyn >>    Dose adjustments this admission: n/a  Microbiology results: n/a  Thank you for allowing pharmacy to be a part of this patient's care.   Adrian Saran, PharmD, BCPS 03/17/2019 8:54 AM

## 2019-03-17 NOTE — Discharge Summary (Signed)
Physician Discharge Summary  [name] YBO:[id number] DOB: 1920-10-02 DOA: 03/14/2019  PCP: Antony Contras, MD  Admit date: 03/14/2019 Discharge date: 03/17/2019  Admitted From: home Disposition:  Home   Recommendations for Outpatient Follow-up:  1. Follow up with PCP in 1-2 weeks  Home Health: PT Equipment/Devices: none  Discharge Condition: stable CODE STATUS: DNR Diet recommendation: soft diet  HPI: Per admitting MD,  [name] is a 86 y.o. female with medical history significant of CKD 3, paroxysmal atrial fibrillation on Eliquis, prior DVTs s/p IVC filter, CHF, previous abdominal surgeries and bowel obstructions managed without surgeries presented to ED for evaluation of nausea and vomiting.  Patient's daughter is also present at the bedside.  Patient states that she had multiple episodes of vomiting since last night.  She was unable to keep anything down because of that vomiting.  She was given Zofran at home that helped with vomiting.  There was also associated abdominal pain.  Patient's daughter reported that there was a big concern for dehydration that is why she is brought to the hospital.  Patient also had a bowel movement today.  Patient otherwise denies fever, chills, hematemesis, melena, blood in the stool, dysuria, chest pain and dyspnea. ED Course: On arrival to ED patient had temperature of 98.1, blood pressure 130/56, heart rate 65, respiratory rate 15 and oxygen saturation 98% on room air.  Blood work showed WBC of 14.2, creatinine 1.15.  UA was negative.  Chest x-ray showed right base air disease most likely secondary to aspiration.  CT abdomen/pelvis showed high-grade partial small bowel obstruction.  In the ED patient was managed with IV fluids, Zofran and started on IV Zosyn to cover for aspiration pneumonia.  General surgery was contacted by ED physician and they recommended NG tube and medical management at this time. NOTE: Patient is a DNR and daughter is present  at bedside.  The daughter also told me that patient is a DNR and conversation is witnessed by the ER nurse.  Patient also stated that she does not want abdominal surgery if needed and only needs medical management.  Hospital Course / Discharge diagnoses: Principal Problem: Small bowel obstruction ([place]) -Patient had no nausea or vomiting at the time of admission hence NGT was held off.  General surgery was consulted and followed patient while hospitalized.  She improved with conservative management, her symptoms improved, her diet was advanced and she is tolerating it well.  I discussed with general surgery, stable for discharge.  Gastroenterology also followed patient and will continue to follow as an outpatient  Active Problems:  Aspiration pneumonia ([place]) -Chest x-ray showed right lower lobe infiltrates, likely secondary to aspiration from multiple episodes of vomiting.  She was maintained on Zosyn while hospitalized, will transition to oral Augmentin for 5 additional days on discharge CKD (chronic kidney disease), stage IIIb (HCC) -Baseline creatinine 1.1, currently at baseline Chronic diastolic heart failure (HCC) -Currently euvolemic, no acute issues, 2D echo 08/2018 had shown EF of 55 to 60%. Atrial fibrillation, paroxysmal (HCC) -Continue amiodarone, on apixaban Hypothyroidism -Continue Synthroid Moderate protein calorie malnutrition -Estimated body mass index is 18.29 kg/m  Discharge Instructions   Allergies as of 03/17/2019      Reactions   Hydrocortisone Other (See Comments)   Sulfa Antibiotics Nausea Only   Cefdinir Other (See Comments)   Caused bad diarrhea Caused bad diarrhea Caused bad diarrhea Caused bad diarrhea Caused bad diarrhea   Cortisone Other (See Comments)   Flushed hands and  face Flushed hands and face Flushed hands and face Flushed hands and face Flushed hands and face   Prednisone Other (See Comments)   cuased blood clots Can have very small dose cuased  blood clots cuased blood clots Can have very small dose cuased blood clots cuased blood clots   Sulfasalazine Other (See Comments)   Nausea side effects Stomach pains and cramping Nausea side effects Stomach pains and cramping Stomach pains and cramping   Sulfonamide Derivatives    Stomach pains and cramping      Medication List    TAKE these medications   acetaminophen 500 MG tablet Commonly known as: TYLENOL Take 500-1,000 mg by mouth See admin instructions. Take 500mg  in the morning, 500mg  midday and 1000mg  at bedtime.   amiodarone 200 MG tablet Commonly known as: PACERONE Take 1 tablet (200 mg total) by mouth daily.   amoxicillin-clavulanate 875-125 MG tablet Commonly known as: Augmentin Take 1 tablet by mouth 2 (two) times daily for 5 days.   apixaban 2.5 MG Tabs tablet Commonly known as: Eliquis Take 1 tablet (2.5 mg total) by mouth 2 (two) times daily.   ASPERCREME LIDOCAINE EX Apply 1 application topically as needed (muscle pain).   Calcium High Potency/Vitamin D 600-200 MG-UNIT Tabs Generic drug: Calcium Carbonate-Vitamin D Take 1 tablet by mouth in the morning and at bedtime.   CRANBERRY EXTRACT PO Take 2 tablets by mouth 2 (two) times daily.   gabapentin 100 MG capsule Commonly known as: NEURONTIN Take 100 mg by mouth 4 (four) times daily.   Glucosamine Chondroitin Complx Tabs Take 1 tablet by mouth 2 (two) times daily.   levothyroxine 150 MCG tablet Commonly known as: SYNTHROID Take 150 mcg by mouth daily before breakfast.   lubiprostone 8 MCG capsule Commonly known as: AMITIZA Take 8 mcg by mouth 3 (three) times daily with meals.   nitrofurantoin 50 MG capsule Commonly known as: MACRODANTIN Take 50 mg by mouth daily.   nitroGLYCERIN 0.4 MG SL tablet Commonly known as: NITROSTAT Place 1 tablet (0.4 mg total) under the tongue every 5 (five) minutes as needed for chest pain.   OCUVITE ADULT 50+ PO Take 1 tablet by mouth daily.     predniSONE 5 MG tablet Commonly known as: DELTASONE Take 5 mg by mouth daily.   traMADol 50 MG tablet Commonly known as: ULTRAM Take 50 mg by mouth daily as needed for moderate pain.   ULTRAFLORA IMMUNE HEALTH PO Take 1 tablet by mouth daily.      Follow-up Information    Antony Contras, MD Follow up.   Specialty: Family Medicine Contact information: [address], [place] [address] [phone]           Consultations:  General surgery   GI  Procedures/Studies:  DG Abd 1 View  Result Date: 03/16/2019 CLINICAL DATA:  Small bowel obstruction EXAM: ABDOMEN - 1 VIEW COMPARISON:  KUB 03/15/2019.  CT 03/14/2019 FINDINGS: Continued mildly prominent small bowel loops in the pelvis, similar prior study. Gas and stool seen throughout the colon. No organomegaly or free air. IVC filter remains in place, unchanged. Leftward scoliosis and degenerative changes in the lumbar spine and hips. No acute bony abnormality. IMPRESSION: Continued mild prominence of small bowel loops in the pelvis, similar to prior study. Electronically Signed   By: Rolm Baptise M.D.   On: 03/16/2019 09:04   DG Abd 1 View  Result Date: 03/15/2019 CLINICAL DATA:  Small bowel obstruction. EXAM: ABDOMEN - 1  VIEW COMPARISON:  CT of the abdomen and pelvis 03/14/2019 FINDINGS: Bowel gas pattern is partially decompressed compared to the CT scan. No free air is present. IVC filter is noted. Left lung bases clear. Right hemidiaphragm is elevated. Scoliosis is again noted. Degenerative changes are present in the hips. IMPRESSION: 1. Partial decompression of bowel gas pattern. 2. No free air. Electronically Signed   By: San Morelle M.D.   On: 03/15/2019 05:20   CT Abdomen Pelvis W Contrast  Result Date: 03/14/2019 CLINICAL DATA:  Nausea vomiting. EXAM: CT ABDOMEN AND PELVIS WITH CONTRAST TECHNIQUE: Multidetector CT imaging of the abdomen and pelvis was performed using the standard protocol  following bolus administration of intravenous contrast. CONTRAST:  44mL OMNIPAQUE IOHEXOL 300 MG/ML  SOLN COMPARISON:  04/16/2017 FINDINGS: Lower chest: Patchy areas of airspace opacity in the right middle lobe with surrounding ground-glass attenuation, have developed since the study of 11/16/2017. Small pericardial effusion. Signs of cardiac enlargement and coronary artery disease. Ectasia of the descending thoracic aorta showing aneurysmal caliber of 4.7 cm in the distal portion within 1-2 mm of previous measurement. Hepatobiliary: Liver with lobular contours. Patent portal vein. Gallbladder is mildly distended without significant pericholecystic stranding. Mild intrahepatic biliary ductal distension and mild intrahepatic biliary ductal distension. Pancreas: Pancreatic atrophy particularly in the pancreatic head. No signs of ductal dilation. Spleen: Spleen normal size without focal lesion. Adrenals/Urinary Tract: Adrenal glands are normal. Kidneys with smooth contours and symmetric enhancement mild parenchymal loss. Urinary bladder is unremarkable. Stomach/Bowel: Marked distension of centralized bowel loops. Multiple areas of alternating dilation and narrowing more so than on prior studies. Overall the pattern of dilation and narrowing is similar but the degree of dilation is much worse than on prior studies, small bowel in the central abdomen measuring as large as 4 cm. Decompression of the ileum No current signs of pneumatosis. ?Paper thin? wall of the bowel in the central abdomen that shows both in bound and out bound sites of narrowing. Vascular/Lymphatic: Marked calcific and noncalcific atherosclerotic changes in the abdominal aorta. No signs of adenopathy. IVC filter remaining in place in the mid IVC below the level of the renal veins. No signs of pelvic adenopathy. Reproductive: Uterus is unremarkable. No adnexal mass. Other: Signs of pelvic ascites and subtle areas of scattered interloop fluid and  mesenteric edema. Musculoskeletal: No signs of acute bone finding or evidence of destructive bone process. Osteopenia and spinal degenerative change. IMPRESSION: 1. Marked distension of bowel loops with multiple areas of alternating dilation and narrowing. The degree of dilation is much worse than on prior studies. No current signs of pneumatosis. Findings are concerning for high-grade partial small bowel obstruction. Some areas with paper thin bowel raising the question of early ischemia, correlate with lactate. 2. Multiple adhesions favored as the etiology, pattern could also be seen in the setting of NSAID enteropathy. Some areas show (in bound side of narrowing seen on image 54 of series 5 and out bound area of narrowing seen on image 33 of series 5) in the low abdomen leading into a dilated segment. Out bound site of narrowing on image closed loop configuration, centralized more dilated loops of bowel for instance in the mid abdomen show areas of narrowing leading into dilated segment and areas of narrowing leading out of the dilated bowel. Surgical consultation may be warranted. 3. Pelvic ascites and mesenteric edema. 4. Signs of presumed pneumonia in the right middle lobe. Attention on follow-up to ensure resolution. 5. Aneurysmal dilation of the thoracic  aorta similar to prior study. 6. Mild intra and extrahepatic biliary ductal distension and gallbladder distension is nonspecific, correlate with laboratory values. 7. Cardiomegaly and signs of coronary artery disease. These results were called by telephone at the time of interpretation on 03/14/2019 at 6:25 pm to provider Dr. Wyvonnia Dusky, Who verbally acknowledged these results. Aortic Atherosclerosis (ICD10-I70.0). Electronically Signed   By: Zetta Bills M.D.   On: 03/14/2019 18:26   DG Chest Portable 1 View  Result Date: 03/14/2019 CLINICAL DATA:  Vomiting. Known small bowel obstruction. Shortness of breath. History of pulmonary embolism. EXAM: PORTABLE  CHEST 1 VIEW COMPARISON:  09/12/2018 FINDINGS: Two AP portable radiographs. Midline trachea. Normal heart size. Atherosclerosis in the transverse aorta. No pleural effusion or pneumothorax. Right base airspace disease. Right hemidiaphragm elevation. IMPRESSION: Right base airspace disease, suspicious for infection or aspiration. Electronically Signed   By: Abigail Miyamoto M.D.   On: 03/14/2019 19:11      Subjective: - no chest pain, shortness of breath, no abdominal pain, nausea or vomiting.   Discharge Exam: BP (!) 139/53 (BP Location: Left Arm)   Pulse (!) 55   Temp 97.9 F (36.6 C) (Oral)   Resp 16   Ht 5\' 6"  (1.676 m)   Wt 51.4 kg   SpO2 99%   BMI 18.29 kg/m   General: Pt is alert, awake, not in acute distress Cardiovascular: RRR, S1/S2 +, no rubs, no gallops Respiratory: CTA bilaterally, no wheezing, no rhonchi Abdominal: Soft, NT, ND, bowel sounds + Extremities: no edema, no cyanosis   The results of significant diagnostics from this hospitalization (including imaging, microbiology, ancillary and laboratory) are listed below for reference.     Microbiology: Recent Results (from the past 240 hour(s))  Respiratory Panel by RT PCR (Flu A&B, Covid) - Nasopharyngeal Swab     Status: None   Collection Time: 03/14/19  7:08 PM   Specimen: Nasopharyngeal Swab  Result Value Ref Range Status   SARS Coronavirus 2 by RT PCR NEGATIVE NEGATIVE Final    Comment: (NOTE) SARS-CoV-2 target nucleic acids are NOT DETECTED. The SARS-CoV-2 RNA is generally detectable in upper respiratoy specimens during the acute phase of infection. The lowest concentration of SARS-CoV-2 viral copies this assay can detect is 131 copies/mL. A negative result does not preclude SARS-Cov-2 infection and should not be used as the sole basis for treatment or other patient management decisions. A negative result may occur with  improper specimen collection/handling, submission of specimen other than nasopharyngeal  swab, presence of viral mutation(s) within the areas targeted by this assay, and inadequate number of viral copies (<131 copies/mL). A negative result must be combined with clinical observations, patient history, and epidemiological information. The expected result is Negative. Fact Sheet for Patients:  PinkCheek.be Fact Sheet for Healthcare Providers:  GravelBags.it This test is not yet ap proved or cleared by the [place] FDA and  has been authorized for detection and/or diagnosis of SARS-CoV-2 by FDA under an Emergency Use Authorization (EUA). This EUA will remain  in effect (meaning this test can be used) for the duration of the COVID-19 declaration under Section 564(b)(1) of the Act, 21 U.S.C. section 360bbb-3(b)(1), unless the authorization is terminated or revoked sooner.    Influenza A by PCR NEGATIVE NEGATIVE Final   Influenza B by PCR NEGATIVE NEGATIVE Final    Comment: (NOTE) The Xpert Xpress SARS-CoV-2/FLU/RSV assay is intended as an aid in  the diagnosis of influenza from Nasopharyngeal swab specimens and  should not be  used as a sole basis for treatment. Nasal washings and  aspirates are unacceptable for Xpert Xpress SARS-CoV-2/FLU/RSV  testing. Fact Sheet for Patients: PinkCheek.be Fact Sheet for Healthcare Providers: GravelBags.it This test is not yet approved or cleared by the [place] FDA and  has been authorized for detection and/or diagnosis of SARS-CoV-2 by  FDA under an Emergency Use Authorization (EUA). This EUA will remain  in effect (meaning this test can be used) for the duration of the  Covid-19 declaration under Section 564(b)(1) of the Act, 21  U.S.C. section 360bbb-3(b)(1), unless the authorization is  terminated or revoked. Performed at [facility] [place] [address], [place], [place] [address]       Labs: Basic Metabolic Panel: Recent Labs  Lab 03/14/19 1632 03/15/19 0433 03/16/19 0345 03/17/19 0420  NA 130* 133* 132* 132*  K 4.5 4.2 4.0 3.8  CL 90* 98 97* 97*  CO2 29 25 26 27   GLUCOSE 130* 85 84 89  BUN 23 18 24* 18  CREATININE 1.15* 1.05* 1.25* 1.11*  CALCIUM 9.3 8.6* 8.3* 8.3*   Liver Function Tests: Recent Labs  Lab 03/14/19 1632 03/15/19 0433  AST 21 16  ALT 12 12  ALKPHOS 46 38  BILITOT 1.0 1.0  PROT 6.0* 5.2*  ALBUMIN 3.8 3.2*   CBC: Recent Labs  Lab 03/14/19 1632 03/15/19 0433 03/16/19 0345 03/17/19 0420  WBC 14.2* 9.3 8.9 8.2  NEUTROABS 12.3*  --   --   --   HGB 13.5 12.5 11.2* 11.1*  HCT 40.7 37.1 34.3* 34.2*  MCV 100.2* 101.1* 102.1* 100.3*  PLT 251 227 228 229   CBG: No results for input(s): GLUCAP in the last 168 hours. Hgb A1c No results for input(s): HGBA1C in the last 72 hours. Lipid Profile No results for input(s): CHOL, HDL, LDLCALC, TRIG, CHOLHDL, LDLDIRECT in the last 72 hours. Thyroid function studies No results for input(s): TSH, T4TOTAL, T3FREE, THYROIDAB in the last 72 hours.  Invalid input(s): FREET3 Urinalysis    Component Value Date/Time   COLORURINE YELLOW 03/14/2019 [place] 03/14/2019 1549   LABSPEC 1.014 03/14/2019 1549   PHURINE 7.0 03/14/2019 1549   GLUCOSEU NEGATIVE 03/14/2019 1549   HGBUR NEGATIVE 03/14/2019 1549   BILIRUBINUR NEGATIVE 03/14/2019 1549   KETONESUR NEGATIVE 03/14/2019 1549   PROTEINUR NEGATIVE 03/14/2019 1549   UROBILINOGEN 0.2 01/17/2013 0730   NITRITE POSITIVE (A) 03/14/2019 1549   LEUKOCYTESUR SMALL (A) 03/14/2019 1549    FURTHER DISCHARGE INSTRUCTIONS:   Get Medicines reviewed and adjusted: Please take all your medications with you for your next visit with your Primary MD   Laboratory/radiological data: Please request your Primary MD to go over all hospital tests and procedure/radiological results at the follow up, please ask your Primary MD to get all Hospital  records sent to his/her office.   In some cases, they will be blood work, cultures and biopsy results pending at the time of your discharge. Please request that your primary care M.D. goes through all the records of your hospital data and follows up on these results.   Also Note the following: If you experience worsening of your admission symptoms, develop shortness of breath, life threatening emergency, suicidal or homicidal thoughts you must seek medical attention immediately by calling 911 or calling your MD immediately  if symptoms less severe.   You must read complete instructions/literature along with all the possible adverse reactions/side effects for all the Medicines you take and that have  been prescribed to you. Take any new Medicines after you have completely understood and accpet all the possible adverse reactions/side effects.    Do not drive when taking Pain medications or sleeping medications (Benzodaizepines)   Do not take more than prescribed Pain, Sleep and Anxiety Medications. It is not advisable to combine anxiety,sleep and pain medications without talking with your primary care practitioner   Special Instructions: If you have smoked or chewed Tobacco  in the last 2 yrs please stop smoking, stop any regular Alcohol  and or any Recreational drug use.   Wear Seat belts while driving.   Please note: You were cared for by a hospitalist during your hospital stay. Once you are discharged, your primary care physician will handle any further medical issues. Please note that NO REFILLS for any discharge medications will be authorized once you are discharged, as it is imperative that you return to your primary care physician (or establish a relationship with a primary care physician if you do not have one) for your post hospital discharge needs so that they can reassess your need for medications and monitor your lab values.  Time coordinating discharge: 40 minutes  SIGNED:  Marzetta Board, MD, PhD 03/17/2019, 3:04 PM

## 2019-03-17 NOTE — Plan of Care (Signed)
Discharge instructions were given to Pt and her daughter. All questions were answered.

## 2019-03-17 NOTE — Progress Notes (Signed)
Physical Therapy Treatment Patient Details Name: [name] MRN: [id number] DOB: 1920-12-16 Today's Date: 03/17/2019    History of Present Illness 86 y.o. female and admitted for SBO. She has PMH significant for recurrent small bowel obstructions, hypothyroid, paroxysmal atrial fibrillation, prior history of pulmonary embolism with IVC filter placement, CKD, chronic diastolic CHF, spinal stenosis with multiple back surgeries, bil TKA, and is legally blind.    PT Comments    Pt limited to transfers today d/t cervical pain/muscle spasms. Repositioned and provided with hot pack. Discussed gentle cervical ROM and shoulder elevation/depression with pt dtr, to try later when pain decr. Despite amb being limited pt is moving with light  min to min/guard assist, cues d/t being legally blind in unfamiliar environment. Continue to recommend HHPT post acute   Follow Up Recommendations  Home health PT;Supervision/Assistance - 24 hour     Equipment Recommendations  None recommended by PT    Recommendations for Other Services       Precautions / Restrictions Precautions Precautions: Fall Restrictions Weight Bearing Restrictions: No    Mobility  Bed Mobility               General bed mobility comments: pt OOB in chair and ended in chair  Transfers Overall transfer level: Needs assistance Equipment used: Rolling walker (2 wheeled) Transfers: Sit to/from [employer] Transfers Sit to Stand: Min guard;From elevated surface Stand pivot transfers: Min guard;Min assist       General transfer comment: pillows on chair for comofort and to raise up. cues for safe hand placement to initiate power up wtih bil UE's and verbal cues for guidance ot RW as pt is legally blind. Min assist to rise to RW and steady, pt with flexed posture on rising.  Ambulation/Gait             General Gait Details: pivotal steps to and from [facility] only d/t pain   Stairs             Wheelchair  Mobility    Modified Rankin (Stroke Patients Only)       Balance                                            Cognition Arousal/Alertness: Awake/alert Behavior During Therapy: WFL for tasks assessed/performed Overall Cognitive Status: Within Functional Limits for tasks assessed                                 General Comments: alert. oriented, follows commands consistently       Exercises      General Comments        Pertinent Vitals/Pain Pain Assessment: Faces Faces Pain Scale: Hurts whole lot Pain Location: L lateral cervical region Pain Descriptors / Indicators: Constant;Grimacing;Guarding Pain Intervention(s): Limited activity within patient's tolerance;Monitored during session;Repositioned;Heat applied    Home Living                      Prior Function            PT Goals (current goals can now be found in the care plan section) Acute Rehab PT Goals Patient Stated Goal: get back home PT Goal Formulation: With patient Time For Goal Achievement: 03/29/19 Potential to Achieve Goals: Good Progress towards PT goals: Progressing toward goals  Frequency    Min 3X/week      PT Plan Current plan remains appropriate    Co-evaluation              AM-PAC PT "6 Clicks" Mobility   Outcome Measure  Help needed turning from your back to your side while in a flat bed without using bedrails?: A Little Help needed moving from lying on your back to sitting on the side of a flat bed without using bedrails?: A Little Help needed moving to and from a bed to a chair (including a wheelchair)?: A Little Help needed standing up from a chair using your arms (e.g., wheelchair or bedside chair)?: A Little Help needed to walk in hospital room?: A Little Help needed climbing 3-5 steps with a railing? : Total 6 Click Score: 16    End of Session Equipment Utilized During Treatment: Gait belt Activity Tolerance: Patient  tolerated treatment well Patient left: in chair;with call bell/phone within reach;with family/visitor present Nurse Communication: Mobility status PT Visit Diagnosis: Muscle weakness (generalized) (M62.81);Unsteadiness on feet (R26.81);Difficulty in walking, not elsewhere classified (R26.2)     Time: 1050-1109 PT Time Calculation (min) (ACUTE ONLY): 19 min  Charges:  $Therapeutic Activity: 8-22 mins                     Baxter Flattery, PT   Acute Rehab Dept [facility]): [phone]   03/17/2019    [facility] 03/17/2019, 11:23 AM

## 2019-03-17 NOTE — Progress Notes (Signed)
Patient ID: [name], female   DOB: 07-29-1920, 86 y.o.   MRN: [id number]       Subjective: Patient had a rough night because of muscle spasms.  She is currently hurting this morning in her right shoulder, feet, and legs.  Her abdomen feels well though.  She tolerated her full liquids well yesterday with no nausea or worsening abdominal pain.  ROS: See above, otherwise other systems negative.  Objective: Vital signs in last 24 hours: Temp:  [98.1 F (36.7 C)-99.2 F (37.3 C)] 98.4 F (36.9 C) (03/03 0547) Pulse Rate:  [54-59] 55 (03/03 0547) Resp:  [14-16] 14 (03/03 0547) BP: (132-152)/(53-69) 145/69 (03/03 0547) SpO2:  [98 %-99 %] 98 % (03/03 0547) Last BM Date: 03/14/19  Intake/Output from previous day: 03/02 0701 - 03/03 0700 In: 660 [P.O.:360; I.V.:46.3; IV Piggyback:253.8] Out: 1200 [Urine:1200] Intake/Output this shift: Total I/O In: -  Out: 200 [Urine:200]  PE: Gen: laying in bed intermittently complaining of pain, but otherwise NAD Heart: regular Lungs: CTAB Abd: soft, NT, Nd, +BS MS: no cyanosis, edema Psych: A&Ox3  Lab Results:  Recent Labs    03/16/19 0345 03/17/19 0420  WBC 8.9 8.2  HGB 11.2* 11.1*  HCT 34.3* 34.2*  PLT 228 229   BMET Recent Labs    03/16/19 0345 03/17/19 0420  NA 132* 132*  K 4.0 3.8  CL 97* 97*  CO2 26 27  GLUCOSE 84 89  BUN 24* 18  CREATININE 1.25* 1.11*  CALCIUM 8.3* 8.3*   PT/INR No results for input(s): LABPROT, INR in the last 72 hours. CMP     Component Value Date/Time   NA 132 (L) 03/17/2019 0420   NA 138 04/05/2014 1547   K 3.8 03/17/2019 0420   CL 97 (L) 03/17/2019 0420   CO2 27 03/17/2019 0420   GLUCOSE 89 03/17/2019 0420   BUN 18 03/17/2019 0420   BUN 32 04/05/2014 1547   CREATININE 1.11 (H) 03/17/2019 0420   CREATININE 1.54 (H) 11/30/2015 1042   CALCIUM 8.3 (L) 03/17/2019 0420   PROT 5.2 (L) 03/15/2019 0433   PROT 6.1 04/05/2014 1547   ALBUMIN 3.2 (L) 03/15/2019 0433   ALBUMIN 4.0  04/05/2014 1547   AST 16 03/15/2019 0433   ALT 12 03/15/2019 0433   ALKPHOS 38 03/15/2019 0433   BILITOT 1.0 03/15/2019 0433   BILITOT 0.3 04/05/2014 1547   GFRNONAA 42 (L) 03/17/2019 0420   GFRAA 49 (L) 03/17/2019 0420   Lipase     Component Value Date/Time   LIPASE 16 03/14/2019 1632       Studies/Results: DG Abd 1 View  Result Date: 03/16/2019 CLINICAL DATA:  Small bowel obstruction EXAM: ABDOMEN - 1 VIEW COMPARISON:  KUB 03/15/2019.  CT 03/14/2019 FINDINGS: Continued mildly prominent small bowel loops in the pelvis, similar prior study. Gas and stool seen throughout the colon. No organomegaly or free air. IVC filter remains in place, unchanged. Leftward scoliosis and degenerative changes in the lumbar spine and hips. No acute bony abnormality. IMPRESSION: Continued mild prominence of small bowel loops in the pelvis, similar to prior study. Electronically Signed   By: Rolm Baptise M.D.   On: 03/16/2019 09:04    Anti-infectives: Anti-infectives (From admission, onward)   Start     Dose/Rate Route Frequency Ordered Stop   03/15/19 0400  piperacillin-tazobactam (ZOSYN) IVPB 3.375 g     3.375 g 12.5 mL/hr over 240 Minutes Intravenous Every 8 hours 03/14/19 2054     03/14/19  1830  piperacillin-tazobactam (ZOSYN) IVPB 3.375 g     3.375 g 100 mL/hr over 30 Minutes Intravenous  Once 03/14/19 1824 03/14/19 1937       Assessment/Plan Aspiration pneumonia-on Zosyn CKD stage III PAF on Eliquis Hx DVTs/PE-s/p IVC filter Hx CHF Hx of goiter/hypothyroidism DNR   SBO -patient improving.  She really wants some oatmeal to eat today.  This is no longer on the FLD so will give her a soft diet.  I spoke with the daughter for about 45 minutes today discussing multiple different things.  We did discuss that the patient may go home on a full liquid diet for several weeks to help with some of these recurrent obstructive issues, but that I will write for the soft diet so she can have  some oatmeal.  However, there is no harm in trying low fiber foods as long as she is taking small bites, chewing well, and avoiding things that will create a large food bolus such as large amounts of nuts, raw vegetables, raw fruits with skins, etc. -we reviewed the patient's CT scan from admission and discussed exactly what the CT scan findings mean . -we discussed that there is no guarantee that these obstructions will not continue to happen, especially since her CT scan shows multiple areas of luminal narrowing likely secondary to adhesive disease causing high-grade partial obstructions.   -we discussed that for now as long as the patient is improving we will continue conservative management.  We discussed that is she were to fail to improve or come back in and not resolve, we may offer an operation.  However, we discussed that a laparotomy on this patient at 86yo and with her currently frailties, etc, that her quality of life would assuredly be less than it is now just from the operation, nevermind if she were to have any complications etc, which she is higher risk for because of her age.  I fear her decreased mobility etc would cause issues postoperatively as well.  We discussed that if she were to fail to improve at some point and surgery was offered that if they did not want to pursue that then the next step would be a comfort care approach.   -the daughter is very appreciative of all of this information.  As of right now though, the patient appears to be improving this time.  If she tolerates her diet, she may be discharged home from our standpoint. -lastly, we did discuss upon admission on her CT scan, she had extensive stool throughout her colon.  She takes a half a dose of miralax daily as well as another prescription.  We discussed that constipation can cause a back up of food contents as well and that keeping her colon relatively clear could potentially help with her small bowel issues, but that  was no guarantee.  We discussed increasing her miralax to a full dose daily.  FEN: soft ID: Zosyn 2/28 DVT: Apixaban Follow-up: pcp, GI   LOS: 3 days    Henreitta Cea , [facility] 03/17/2019, 9:43 AM Please see Amion for pager number during day hours 7:00am-4:30pm or 7:00am -11:30am on weekends

## 2019-03-17 NOTE — Progress Notes (Signed)
[name] 2:43 PM  Subjective: Patient doing much better from a GI standpoint although has had some musculoskeletal issues in the last 24 hours and she is eating more and having less pain but has not had a bowel movement her case again discussed with her daughter  Objective: Vital signs stable afebrile in good spirits looks better abdomen is softer nontender rare bowel sounds labs stable  Assessment: Resolving partial small bowel obstruction probably due to adhesions  Plan: Will increase MiraLAX and hopefully she will be able to go home soon and will need home health care and social work assistance and I am happy to see back as an outpatient as needed  [facility]  office [phone] After 5PM or if no answer call [phone]

## 2019-03-17 NOTE — Care Management Important Message (Signed)
Important Message  Patient Details IM Letter given to [place] Case Manager to present to the Patient Name: KAYDIE PETSCH MRN: [id number] Date of Birth: 09-21-1920   Medicare Important Message Given:  Yes     Kerin Salen 03/17/2019, 11:32 AM

## 2019-03-18 DIAGNOSIS — H547 Unspecified visual loss: Secondary | ICD-10-CM | POA: Diagnosis not present

## 2019-03-18 DIAGNOSIS — K56600 Partial intestinal obstruction, unspecified as to cause: Secondary | ICD-10-CM | POA: Diagnosis not present

## 2019-03-18 DIAGNOSIS — K529 Noninfective gastroenteritis and colitis, unspecified: Secondary | ICD-10-CM | POA: Diagnosis not present

## 2019-03-18 DIAGNOSIS — Z7901 Long term (current) use of anticoagulants: Secondary | ICD-10-CM | POA: Diagnosis not present

## 2019-03-18 DIAGNOSIS — M316 Other giant cell arteritis: Secondary | ICD-10-CM | POA: Diagnosis not present

## 2019-03-18 DIAGNOSIS — E440 Moderate protein-calorie malnutrition: Secondary | ICD-10-CM | POA: Diagnosis not present

## 2019-03-18 DIAGNOSIS — Z7952 Long term (current) use of systemic steroids: Secondary | ICD-10-CM | POA: Diagnosis not present

## 2019-03-18 DIAGNOSIS — I700 Atherosclerosis of aorta: Secondary | ICD-10-CM | POA: Diagnosis not present

## 2019-03-18 DIAGNOSIS — M48061 Spinal stenosis, lumbar region without neurogenic claudication: Secondary | ICD-10-CM | POA: Diagnosis not present

## 2019-03-18 DIAGNOSIS — Z86711 Personal history of pulmonary embolism: Secondary | ICD-10-CM | POA: Diagnosis not present

## 2019-03-18 DIAGNOSIS — E039 Hypothyroidism, unspecified: Secondary | ICD-10-CM | POA: Diagnosis not present

## 2019-03-18 DIAGNOSIS — I480 Paroxysmal atrial fibrillation: Secondary | ICD-10-CM | POA: Diagnosis not present

## 2019-03-18 DIAGNOSIS — N1832 Chronic kidney disease, stage 3b: Secondary | ICD-10-CM | POA: Diagnosis not present

## 2019-03-18 DIAGNOSIS — J690 Pneumonitis due to inhalation of food and vomit: Secondary | ICD-10-CM | POA: Diagnosis not present

## 2019-03-18 DIAGNOSIS — I5032 Chronic diastolic (congestive) heart failure: Secondary | ICD-10-CM | POA: Diagnosis not present

## 2019-03-18 DIAGNOSIS — L89211 Pressure ulcer of right hip, stage 1: Secondary | ICD-10-CM | POA: Diagnosis not present

## 2019-03-18 DIAGNOSIS — L89111 Pressure ulcer of right upper back, stage 1: Secondary | ICD-10-CM | POA: Diagnosis not present

## 2019-03-18 DIAGNOSIS — Z86718 Personal history of other venous thrombosis and embolism: Secondary | ICD-10-CM | POA: Diagnosis not present

## 2019-03-21 DIAGNOSIS — L89111 Pressure ulcer of right upper back, stage 1: Secondary | ICD-10-CM | POA: Diagnosis not present

## 2019-03-21 DIAGNOSIS — I480 Paroxysmal atrial fibrillation: Secondary | ICD-10-CM | POA: Diagnosis not present

## 2019-03-21 DIAGNOSIS — J690 Pneumonitis due to inhalation of food and vomit: Secondary | ICD-10-CM | POA: Diagnosis not present

## 2019-03-21 DIAGNOSIS — L89211 Pressure ulcer of right hip, stage 1: Secondary | ICD-10-CM | POA: Diagnosis not present

## 2019-03-21 DIAGNOSIS — I5032 Chronic diastolic (congestive) heart failure: Secondary | ICD-10-CM | POA: Diagnosis not present

## 2019-03-21 DIAGNOSIS — K56600 Partial intestinal obstruction, unspecified as to cause: Secondary | ICD-10-CM | POA: Diagnosis not present

## 2019-03-23 ENCOUNTER — Telehealth: Payer: Self-pay | Admitting: Interventional Cardiology

## 2019-03-23 NOTE — Telephone Encounter (Signed)
Pt c/o medication issue:  1. Name of Medication: Zofran 4mg   2. How are you currently taking this medication (dosage and times per day)? Whenever needed for nausea, Directions state take 1-2 tablets up to 3x daily. She took one last night  3. Are you having a reaction (difficulty breathing--STAT)? no  4. What is your medication issue? Daughter calling in to make sure this medication is okay for her mother to take while on her other heart medications.

## 2019-03-23 NOTE — Telephone Encounter (Signed)
Zofran has a drug drug interaction with amiodarone (can prolong the QTc interval).  EKG 03/15/19 QTc 555. Other antiemetics such as Phenergan or Dramamine are on the beers list for anticholinergic properties and should be avoided. Would recommend an alternative for nausea that does not prolong the QTc interval and is not on the [employer] List such as over the counter Pepto Bismol.

## 2019-03-23 NOTE — Telephone Encounter (Signed)
Any contraindications for Zofran use?

## 2019-03-23 NOTE — Telephone Encounter (Signed)
Spoke with daughter and made her aware of information from the Pharmacist.  Daughter appreciative for call.

## 2019-03-24 ENCOUNTER — Telehealth: Payer: Self-pay | Admitting: Interventional Cardiology

## 2019-03-24 DIAGNOSIS — Z7952 Long term (current) use of systemic steroids: Secondary | ICD-10-CM | POA: Diagnosis not present

## 2019-03-24 DIAGNOSIS — M5416 Radiculopathy, lumbar region: Secondary | ICD-10-CM | POA: Diagnosis not present

## 2019-03-24 DIAGNOSIS — M0609 Rheumatoid arthritis without rheumatoid factor, multiple sites: Secondary | ICD-10-CM | POA: Diagnosis not present

## 2019-03-24 DIAGNOSIS — M150 Primary generalized (osteo)arthritis: Secondary | ICD-10-CM | POA: Diagnosis not present

## 2019-03-24 DIAGNOSIS — M7061 Trochanteric bursitis, right hip: Secondary | ICD-10-CM | POA: Diagnosis not present

## 2019-03-24 NOTE — Telephone Encounter (Signed)
Pt has never been seen by pharmacy before, did she specify what she needs a telehealth visit for?

## 2019-03-24 NOTE — Telephone Encounter (Signed)
New Messsage:   Pt wants to know if she can have a phone visit please?

## 2019-03-25 DIAGNOSIS — J690 Pneumonitis due to inhalation of food and vomit: Secondary | ICD-10-CM | POA: Diagnosis not present

## 2019-03-25 DIAGNOSIS — K56609 Unspecified intestinal obstruction, unspecified as to partial versus complete obstruction: Secondary | ICD-10-CM | POA: Diagnosis not present

## 2019-03-25 DIAGNOSIS — E440 Moderate protein-calorie malnutrition: Secondary | ICD-10-CM | POA: Diagnosis not present

## 2019-03-25 DIAGNOSIS — R110 Nausea: Secondary | ICD-10-CM | POA: Diagnosis not present

## 2019-03-25 DIAGNOSIS — R5381 Other malaise: Secondary | ICD-10-CM | POA: Diagnosis not present

## 2019-03-25 DIAGNOSIS — I480 Paroxysmal atrial fibrillation: Secondary | ICD-10-CM | POA: Diagnosis not present

## 2019-03-25 DIAGNOSIS — I5032 Chronic diastolic (congestive) heart failure: Secondary | ICD-10-CM | POA: Diagnosis not present

## 2019-03-25 DIAGNOSIS — N1832 Chronic kidney disease, stage 3b: Secondary | ICD-10-CM | POA: Diagnosis not present

## 2019-03-25 NOTE — Telephone Encounter (Signed)
Spoke with daughter, [name] who wanted to know what antinausea medication that she can take long term. Was prescribed zofran on discharge but there was some concern about prolonged QTc with amiodarone. I will route to GI to see what they might be effective for her small bowel obstruction and to see if they think she needs to be seen since she is still symptomatic.  Concerned about using medications like phenergan and dramamine due to increased risk of falls (on beers list). Not sure something like pepto bismol would be effective.

## 2019-03-26 DIAGNOSIS — I480 Paroxysmal atrial fibrillation: Secondary | ICD-10-CM | POA: Diagnosis not present

## 2019-03-26 DIAGNOSIS — J690 Pneumonitis due to inhalation of food and vomit: Secondary | ICD-10-CM | POA: Diagnosis not present

## 2019-03-26 DIAGNOSIS — L89211 Pressure ulcer of right hip, stage 1: Secondary | ICD-10-CM | POA: Diagnosis not present

## 2019-03-26 DIAGNOSIS — K56600 Partial intestinal obstruction, unspecified as to cause: Secondary | ICD-10-CM | POA: Diagnosis not present

## 2019-03-26 DIAGNOSIS — L89111 Pressure ulcer of right upper back, stage 1: Secondary | ICD-10-CM | POA: Diagnosis not present

## 2019-03-26 DIAGNOSIS — I5032 Chronic diastolic (congestive) heart failure: Secondary | ICD-10-CM | POA: Diagnosis not present

## 2019-03-26 NOTE — Telephone Encounter (Signed)
I have no recommendation

## 2019-03-29 ENCOUNTER — Telehealth: Payer: Self-pay | Admitting: Interventional Cardiology

## 2019-03-29 NOTE — Telephone Encounter (Signed)
New Message:     Patient saw her Podiatric and  It was recommended that she see Dr [name]. Pt right foot and toes are swelling. Her toes are turning to red and purple color. I made an appointment with Dr [name] for Thursday(04-01-19). Please call to evaluate.

## 2019-03-29 NOTE — Telephone Encounter (Signed)
Spoke with patients daughter, patricia, and recommended she call the GI Dr. Gabriel Carina for a few other recommendations for patients nausea and we could tell her from a heart prospective if they were safe. I am not well versed on small bowel obstructions and would not want to make a recommendation that is ineffective.

## 2019-03-29 NOTE — Telephone Encounter (Signed)
Spoke with daughter and she states that pt seen Podiatrist prior to her recent hospitalization and he had recommended that pt see Cardiology in regards to the swelling and discoloration of pt's feet and toes.  Daughter states pt's left foot has been doing this intermittently for awhile but resolves quickly.  Right foot is now involved.  Home health has been coming in since hospitalization and recommended the pt be seen as well due to concerns for vascular issues.  Pt is scheduled to see Dr. Corky Mull.  Advised to keep this appt.

## 2019-03-29 NOTE — Telephone Encounter (Signed)
Patient daughter called back stating she spoke with GI and they suggested Xanax or Ativan for nausea. I advised that from a heart prospective and a drug interaction prospective with amiodarone that this was safe. I did advised that both of these can increase the risk for altered mental status, solmulance and increased fall risk.I advised monitor closely for these reactions if she chooses to use and to use the lowest dose possible.

## 2019-03-31 DIAGNOSIS — L89111 Pressure ulcer of right upper back, stage 1: Secondary | ICD-10-CM | POA: Diagnosis not present

## 2019-03-31 DIAGNOSIS — I480 Paroxysmal atrial fibrillation: Secondary | ICD-10-CM | POA: Diagnosis not present

## 2019-03-31 DIAGNOSIS — J690 Pneumonitis due to inhalation of food and vomit: Secondary | ICD-10-CM | POA: Diagnosis not present

## 2019-03-31 DIAGNOSIS — K56600 Partial intestinal obstruction, unspecified as to cause: Secondary | ICD-10-CM | POA: Diagnosis not present

## 2019-03-31 DIAGNOSIS — I5032 Chronic diastolic (congestive) heart failure: Secondary | ICD-10-CM | POA: Diagnosis not present

## 2019-03-31 DIAGNOSIS — L89211 Pressure ulcer of right hip, stage 1: Secondary | ICD-10-CM | POA: Diagnosis not present

## 2019-03-31 NOTE — Progress Notes (Signed)
Cardiology Office Note:    Date:  04/01/2019   ID:  [name], DOB 1920/04/25, MRN [id number]  PCP:  Antony Contras, MD  Cardiologist:  Sinclair Grooms, MD   Referring MD: Antony Contras, MD   Chief Complaint  Patient presents with  . Atrial Fibrillation  . Advice Only    Back and chest pain  . Follow-up    Recent hospital stay for small bowel obstruction.    History of Present Illness:    [name] is a 86 y.o. female with a hx of paroxysmal atrial fibrillation, chronic amiodarone therapy, history of PE on chronic anticoagulation with Eliquis 2.5 mg twice daily, CKD stage II, recurrent SBO, and left foot swelling and erythema/blanching. ? PAD Dr.Segal.   She is accompanied by her daughter.  They are here today primarily because of discoloration in the left foot.  The history is that there are times when the sole of the foot can appear red.  There are other times when it has a bluish discoloration.  Is no associated pain.  There is some mild dorsal foot edema.  Ambulation is not associated with foot pain.  Other complaints include discomfort in a line from the sternum around the rib cage in the midsternal area.  This occurs when she lays down at night and lasts seconds to minutes before going away.  It occurs frequently.  It is not associated with physical activity.  Another complaint is intermittent bandlike discomfort below the costal margin bilaterally.  No associated nausea or vomiting.  Finally they are concerned about whether she should be seeing a gerontologist.  I did not encourage this nor did I discourage.  I simply let them know that there are not many in [place] and that they would have to search and probably wait quite some time before being seen.  Past Medical History:  Diagnosis Date  . Blindness   . Blood clot associated with vein wall inflammation   . CKD (chronic kidney disease), stage III   . Colitis   . Goiter    s/p radioactive iod. tx  .  Hypothyroidism   . On amiodarone therapy   . Paroxysmal atrial fibrillation (HCC)   . Pulmonary embolus (HCC)    and DVT s/p IVC filter  . SBO (small bowel obstruction) ([place])   . Spinal stenosis of lumbar region with radiculopathy    possible neurogenic claudication recently?  . Temporal arteritis [facility])     Past Surgical History:  Procedure Laterality Date  . APPENDECTOMY  1940  . BACK SURGERY  04/1998   spinal stenosis  . BACK SURGERY  2000   Ruptured Disc  . BREAST BIOPSY     cysts in both breasts  . CARPAL TUNNEL RELEASE  2002  . ivc filter    . PARTIAL HYSTERECTOMY    . TONSILLECTOMY  1934  . TOTAL KNEE ARTHROPLASTY Left 04/1999  . TOTAL KNEE ARTHROPLASTY Right 02/2000    Current Medications: Current Meds  Medication Sig  . acetaminophen (TYLENOL) 500 MG tablet Take 500-1,000 mg by mouth See admin instructions. Take 500mg  in the morning, 500mg  midday and 1000mg  at bedtime.  Marland Kitchen amiodarone (PACERONE) 200 MG tablet Take 1 tablet (200 mg total) by mouth daily.  Marland Kitchen apixaban (ELIQUIS) 2.5 MG TABS tablet Take 1 tablet (2.5 mg total) by mouth 2 (two) times daily.  . ASPERCREME LIDOCAINE EX Apply 1 application topically as needed (muscle pain).  . Calcium Carbonate-Vitamin D (CALCIUM  HIGH POTENCY/VITAMIN D) 600-200 MG-UNIT TABS Take 1 tablet by mouth in the morning and at bedtime.   Marland Kitchen CRANBERRY EXTRACT PO Take 2 tablets by mouth 2 (two) times daily.   Marland Kitchen gabapentin (NEURONTIN) 100 MG capsule Take 100 mg by mouth 4 (four) times daily.   Marland Kitchen levothyroxine (SYNTHROID, LEVOTHROID) 150 MCG tablet Take 150 mcg by mouth daily before breakfast.  . lubiprostone (AMITIZA) 8 MCG capsule Take 8 mcg by mouth 3 (three) times daily with meals.   . Misc Natural Products (GLUCOSAMINE CHONDROITIN COMPLX) TABS Take 1 tablet by mouth 2 (two) times daily.   . Multiple Vitamins-Minerals (OCUVITE ADULT 50+ PO) Take 1 tablet by mouth daily.  . nitrofurantoin (MACRODANTIN) 50 MG capsule Take 50 mg by mouth  daily.  . predniSONE (DELTASONE) 5 MG tablet Take 5 mg by mouth daily.   . Probiotic Product (ULTRAFLORA IMMUNE HEALTH PO) Take 1 tablet by mouth daily.   . traMADol (ULTRAM) 50 MG tablet Take 50 mg by mouth daily as needed for moderate pain.      Allergies:   Hydrocortisone, Sulfa antibiotics, Cefdinir, Cortisone, Prednisone, Sulfasalazine, and Sulfonamide derivatives   Social History   Socioeconomic History  . Marital status: Widowed    Spouse name: Not on file  . Number of children: 4  . Years of education: College  . Highest education level: Not on file  Occupational History    Employer: RETIRED    Comment: retired  Tobacco Use  . Smoking status: Never Smoker  . Smokeless tobacco: Never Used  Substance and Sexual Activity  . Alcohol use: No  . Drug use: No  . Sexual activity: Never  Other Topics Concern  . Not on file  Social History Narrative   Patient lives at home alone.  Use walker to get around   Caffeine Use: rarely   Patient is right handed   Social Determinants of Health   Financial Resource Strain:   . Difficulty of Paying Living Expenses:   Food Insecurity:   . Worried About Charity fundraiser in the Last Year:   . Arboriculturist in the Last Year:   Transportation Needs:   . Film/video editor (Medical):   Marland Kitchen Lack of Transportation (Non-Medical):   Physical Activity:   . Days of Exercise per Week:   . Minutes of Exercise per Session:   Stress:   . Feeling of Stress :   Social Connections:   . Frequency of Communication with Friends and Family:   . Frequency of Social Gatherings with Friends and Family:   . Attends Religious Services:   . Active Member of Clubs or Organizations:   . Attends Archivist Meetings:   Marland Kitchen Marital Status:      Family History: The patient's family history includes Breast cancer in her sister; Congestive Heart Failure in her mother; Stroke in her father. There is no history of Crohn's disease or Ulcerative  colitis.  ROS:   Please see the history of present illness.    Chronic back pain.  Her back hurts when she lays down.  She has occasional nausea.  They are concerned about using Zofran because of QT prolongation with the patient on amiodarone.  She has not had recent diarrhea, nausea, vomiting, or abdominal pain.  She has had partial small bowel obstruction on numerous occasions (?  3) all other systems reviewed and are negative.  EKGs/Labs/Other Studies Reviewed:    The following studies were reviewed  today:  2D Doppler echocardiogram done in 2020: IMPRESSIONS    1. The left ventricle has normal systolic function, with an ejection  fraction of 55-60%. The cavity size was normal. There is moderately  increased left ventricular wall thickness. Left ventricular diastolic  Doppler parameters are consistent with  pseudonormalization. Elevated left ventricular end-diastolic pressure No  evidence of left ventricular regional wall motion abnormalities.  2. The right ventricle has normal systolic function. The cavity was  normal. There is no increase in right ventricular wall thickness. Right  ventricular systolic pressure could not be assessed.  3. Left atrial size was mildly dilated.  4. The pericardial effusion is localized near the right atrium and  anterior to the right ventricle.  5. Trivial pericardial effusion is present.  6. Mild thickening of the mitral valve leaflet. Mild calcification of the  mitral valve leaflet. There is moderate mitral annular calcification  present.  7. The aortic valve is tricuspid. Moderate sclerosis of the aortic valve.  Aortic valve regurgitation is mild by color flow Doppler.  8. The aorta is normal unless otherwise noted.  9. The interatrial septum appears to be lipomatous.   EKG:  EKG not repeated  Recent Labs: 09/12/2018: Magnesium 2.0; TSH 2.264 03/15/2019: ALT 12 03/17/2019: BUN 18; Creatinine, Ser 1.11; Hemoglobin 11.1; Platelets 229;  Potassium 3.8; Sodium 132  Recent Lipid Panel No results found for: CHOL, TRIG, HDL, CHOLHDL, VLDL, LDLCALC, LDLDIRECT  Physical Exam:    VS:  BP 118/60   Pulse (!) 59   Ht 5\' 4"  (1.626 m)   Wt 108 lb (49 kg)   SpO2 100%   BMI 18.54 kg/m     Wt Readings from Last 3 Encounters:  04/01/19 108 lb (49 kg)  03/14/19 113 lb 5.1 oz (51.4 kg)  10/20/18 111 lb (50.3 kg)     GEN: Consistent with age, frail in appearance. No acute distress HEENT: Normal NECK: No JVD. LYMPHATICS: No lymphadenopathy CARDIAC: R RR without murmur, gallop, or edema. VASCULAR:  Normal Pulses. No bruits. RESPIRATORY:  Clear to auscultation without rales, wheezing or rhonchi  ABDOMEN: Soft, non-tender, non-distended, No pulsatile mass, MUSCULOSKELETAL: No deformity  SKIN: Warm and dry NEUROLOGIC:  Alert and oriented x 3 PSYCHIATRIC:  Normal affect   ASSESSMENT:    1. Paroxysmal atrial fibrillation (HCC)   2. CKD (chronic kidney disease), stage IV ([place])   3. Chronic diastolic heart failure ([place])   4. Medication management   5. Encounter for monitoring amiodarone therapy    PLAN:    In order of problems listed above:  1. She has normal rhythm today and therefore is presumed to be in sinus rhythm.  She is on amiodarone and recent hospital stay demonstrated normal liver function and TSH. 2. Kidney function not discussed 3. No evidence of volume overload 4. Multiple other issues that are noncardiac were discussed.  There is no simple explanation.  Most of the pain syndrome that she has is related to kyphoscoliosis and very positional.  The recurring small bowel obstruction is likely to recur.  She has refused surgery at her age, and rightfully so.  We will continue conservative management for the foreseeable future.  Not likely to ever have surgery. 5. 61-month follow-up for amiodarone monitoring 6. COVID-19 vaccine and 3 W's discussed.   Prolonged office visit related to greater than 50% of time required  for education and planning.  Continue conservative medical management.  Medication Adjustments/Labs and Tests Ordered: Current medicines are reviewed at  length with the patient today.  Concerns regarding medicines are outlined above.  No orders of the defined types were placed in this encounter.  No orders of the defined types were placed in this encounter.   Patient Instructions  Medication Instructions:  Your physician recommends that you continue on your current medications as directed. Please refer to the Current Medication list given to you today.  *If you need a refill on your cardiac medications before your next appointment, please call your pharmacy*   Lab Work: None If you have labs (blood work) drawn today and your tests are completely normal, you will receive your results only by: Marland Kitchen MyChart Message (if you have MyChart) OR . A paper copy in the mail If you have any lab test that is abnormal or we need to change your treatment, we will call you to review the results.   Testing/Procedures: None   Follow-Up: At [facility], you and your health needs are our priority.  As part of our continuing mission to provide you with exceptional heart care, we have created designated Provider Care Teams.  These Care Teams include your primary Cardiologist (physician) and Advanced Practice Providers (APPs -  Physician Assistants and Nurse Practitioners) who all work together to provide you with the care you need, when you need it.  We recommend signing up for the patient portal called "MyChart".  Sign up information is provided on this After Visit Summary.  MyChart is used to connect with patients for Virtual Visits (Telemedicine).  Patients are able to view lab/test results, encounter notes, upcoming appointments, etc.  Non-urgent messages can be sent to your provider as well.   To learn more about what you can do with MyChart, go to [email or web address].    Your next  appointment:   6 month(s)  The format for your next appointment:   In Person  Provider:   You may see Sinclair Grooms, MD or one of the following Advanced Practice Providers on your designated Care Team:    Truitt Merle, NP  Cecilie Kicks, NP  Kathyrn Drown, NP    Other Instructions      Signed, Sinclair Grooms, MD  04/01/2019 12:30 PM    [place]

## 2019-04-01 ENCOUNTER — Ambulatory Visit (INDEPENDENT_AMBULATORY_CARE_PROVIDER_SITE_OTHER): Payer: Medicare Other | Admitting: Interventional Cardiology

## 2019-04-01 ENCOUNTER — Encounter: Payer: Self-pay | Admitting: Interventional Cardiology

## 2019-04-01 ENCOUNTER — Other Ambulatory Visit: Payer: Self-pay

## 2019-04-01 VITALS — BP 118/60 | HR 59 | Ht 64.0 in | Wt 108.0 lb

## 2019-04-01 DIAGNOSIS — Z79899 Other long term (current) drug therapy: Secondary | ICD-10-CM

## 2019-04-01 DIAGNOSIS — Z5181 Encounter for therapeutic drug level monitoring: Secondary | ICD-10-CM | POA: Diagnosis not present

## 2019-04-01 DIAGNOSIS — I5032 Chronic diastolic (congestive) heart failure: Secondary | ICD-10-CM

## 2019-04-01 DIAGNOSIS — N184 Chronic kidney disease, stage 4 (severe): Secondary | ICD-10-CM

## 2019-04-01 DIAGNOSIS — L89211 Pressure ulcer of right hip, stage 1: Secondary | ICD-10-CM | POA: Diagnosis not present

## 2019-04-01 DIAGNOSIS — L89111 Pressure ulcer of right upper back, stage 1: Secondary | ICD-10-CM | POA: Diagnosis not present

## 2019-04-01 DIAGNOSIS — I480 Paroxysmal atrial fibrillation: Secondary | ICD-10-CM | POA: Diagnosis not present

## 2019-04-01 DIAGNOSIS — J690 Pneumonitis due to inhalation of food and vomit: Secondary | ICD-10-CM | POA: Diagnosis not present

## 2019-04-01 DIAGNOSIS — K56600 Partial intestinal obstruction, unspecified as to cause: Secondary | ICD-10-CM | POA: Diagnosis not present

## 2019-04-01 NOTE — Patient Instructions (Signed)

## 2019-04-04 DIAGNOSIS — K56600 Partial intestinal obstruction, unspecified as to cause: Secondary | ICD-10-CM | POA: Diagnosis not present

## 2019-04-04 DIAGNOSIS — L89111 Pressure ulcer of right upper back, stage 1: Secondary | ICD-10-CM | POA: Diagnosis not present

## 2019-04-04 DIAGNOSIS — J690 Pneumonitis due to inhalation of food and vomit: Secondary | ICD-10-CM | POA: Diagnosis not present

## 2019-04-04 DIAGNOSIS — I480 Paroxysmal atrial fibrillation: Secondary | ICD-10-CM | POA: Diagnosis not present

## 2019-04-04 DIAGNOSIS — L89211 Pressure ulcer of right hip, stage 1: Secondary | ICD-10-CM | POA: Diagnosis not present

## 2019-04-04 DIAGNOSIS — I5032 Chronic diastolic (congestive) heart failure: Secondary | ICD-10-CM | POA: Diagnosis not present

## 2019-04-08 DIAGNOSIS — J690 Pneumonitis due to inhalation of food and vomit: Secondary | ICD-10-CM | POA: Diagnosis not present

## 2019-04-08 DIAGNOSIS — L89111 Pressure ulcer of right upper back, stage 1: Secondary | ICD-10-CM | POA: Diagnosis not present

## 2019-04-08 DIAGNOSIS — I480 Paroxysmal atrial fibrillation: Secondary | ICD-10-CM | POA: Diagnosis not present

## 2019-04-08 DIAGNOSIS — K56600 Partial intestinal obstruction, unspecified as to cause: Secondary | ICD-10-CM | POA: Diagnosis not present

## 2019-04-08 DIAGNOSIS — L89211 Pressure ulcer of right hip, stage 1: Secondary | ICD-10-CM | POA: Diagnosis not present

## 2019-04-08 DIAGNOSIS — I5032 Chronic diastolic (congestive) heart failure: Secondary | ICD-10-CM | POA: Diagnosis not present

## 2019-04-09 ENCOUNTER — Telehealth: Payer: Self-pay | Admitting: Interventional Cardiology

## 2019-04-09 DIAGNOSIS — R001 Bradycardia, unspecified: Secondary | ICD-10-CM

## 2019-04-09 NOTE — Telephone Encounter (Signed)
   STAT if HR is under 50 or over 120 (normal HR is 60-100 beats per minute)  1) What is your heart rate? resting HR 49  2) Do you have a log of your heart rate readings (document readings)? yesterday morning it was 44 and highest 56. yesterday it went to mid to upper 40s 3x  3) Do you have any other symptoms? [name] called, she said pt feels a little weak if her HR is at 35 to 75, she ask for call back  Please call

## 2019-04-09 NOTE — Telephone Encounter (Signed)
Spoke with daughter and she states three times in the last few days she has checked pt's HR and it was in the 68s.  Pt usually runs 52-57.  Other times she has checked it, it has been in her normal range.  When HR is in the 40s, pt c/o fatigue and just generally not feeling well.  Spoke with Dr. [name] and he would like for pt to wear a monitor for 3 days.  Advised daughter of this and she was in agreement.

## 2019-04-10 ENCOUNTER — Telehealth: Payer: Self-pay | Admitting: Medical

## 2019-04-10 NOTE — Telephone Encounter (Signed)
   Patient's daughter, [name], called the after hours line with concerns for low HR's. She called the office yesterday to report HR's in the 27s and that her mother was feeling poorly at that time; typically her HR is in the 71s. Today her HR has persisted in the 40s, though she is feeling better. Increased appetite and energy. HR increases to 60 with ambulation. Thankfully no complaints of dizziness, lightheadedness, or syncope. Dr. [name] recommended a 3 day monitor to further evaluate her bradycardia. Suggested watchful waiting. We discussed ED precautions. They will plan to complete the cardiac monitor and follow-up with Dr. [name] afterwards. She was appreciative of the call and will continue to monitor her mothers vitals and symptoms.  Abigail Butts, PA-C 04/10/19; 3:26 PM

## 2019-04-12 ENCOUNTER — Encounter: Payer: Self-pay | Admitting: *Deleted

## 2019-04-12 NOTE — Progress Notes (Signed)
Patient ID: [name], female   DOB: November 17, 1920, 86 y.o.   MRN: [id number] Patient enrolled for [employer] to mail a 3 day ZIO XT long term holter monitor to her home.  Instructions sent to patient via My Chart message and will also be included in her monitor kit.

## 2019-04-15 ENCOUNTER — Ambulatory Visit (INDEPENDENT_AMBULATORY_CARE_PROVIDER_SITE_OTHER): Payer: Medicare Other

## 2019-04-15 DIAGNOSIS — R001 Bradycardia, unspecified: Secondary | ICD-10-CM

## 2019-04-15 DIAGNOSIS — L89211 Pressure ulcer of right hip, stage 1: Secondary | ICD-10-CM | POA: Diagnosis not present

## 2019-04-15 DIAGNOSIS — I5032 Chronic diastolic (congestive) heart failure: Secondary | ICD-10-CM | POA: Diagnosis not present

## 2019-04-15 DIAGNOSIS — K56600 Partial intestinal obstruction, unspecified as to cause: Secondary | ICD-10-CM | POA: Diagnosis not present

## 2019-04-15 DIAGNOSIS — I480 Paroxysmal atrial fibrillation: Secondary | ICD-10-CM | POA: Diagnosis not present

## 2019-04-15 DIAGNOSIS — J690 Pneumonitis due to inhalation of food and vomit: Secondary | ICD-10-CM | POA: Diagnosis not present

## 2019-04-15 DIAGNOSIS — L89111 Pressure ulcer of right upper back, stage 1: Secondary | ICD-10-CM | POA: Diagnosis not present

## 2019-04-16 DIAGNOSIS — J690 Pneumonitis due to inhalation of food and vomit: Secondary | ICD-10-CM | POA: Diagnosis not present

## 2019-04-16 DIAGNOSIS — L89111 Pressure ulcer of right upper back, stage 1: Secondary | ICD-10-CM | POA: Diagnosis not present

## 2019-04-16 DIAGNOSIS — K56600 Partial intestinal obstruction, unspecified as to cause: Secondary | ICD-10-CM | POA: Diagnosis not present

## 2019-04-16 DIAGNOSIS — L89211 Pressure ulcer of right hip, stage 1: Secondary | ICD-10-CM | POA: Diagnosis not present

## 2019-04-16 DIAGNOSIS — I5032 Chronic diastolic (congestive) heart failure: Secondary | ICD-10-CM | POA: Diagnosis not present

## 2019-04-16 DIAGNOSIS — I480 Paroxysmal atrial fibrillation: Secondary | ICD-10-CM | POA: Diagnosis not present

## 2019-04-17 DIAGNOSIS — I480 Paroxysmal atrial fibrillation: Secondary | ICD-10-CM | POA: Diagnosis not present

## 2019-04-17 DIAGNOSIS — L89111 Pressure ulcer of right upper back, stage 1: Secondary | ICD-10-CM | POA: Diagnosis not present

## 2019-04-17 DIAGNOSIS — I700 Atherosclerosis of aorta: Secondary | ICD-10-CM | POA: Diagnosis not present

## 2019-04-17 DIAGNOSIS — Z7952 Long term (current) use of systemic steroids: Secondary | ICD-10-CM | POA: Diagnosis not present

## 2019-04-17 DIAGNOSIS — M48061 Spinal stenosis, lumbar region without neurogenic claudication: Secondary | ICD-10-CM | POA: Diagnosis not present

## 2019-04-17 DIAGNOSIS — E440 Moderate protein-calorie malnutrition: Secondary | ICD-10-CM | POA: Diagnosis not present

## 2019-04-17 DIAGNOSIS — K529 Noninfective gastroenteritis and colitis, unspecified: Secondary | ICD-10-CM | POA: Diagnosis not present

## 2019-04-17 DIAGNOSIS — N1832 Chronic kidney disease, stage 3b: Secondary | ICD-10-CM | POA: Diagnosis not present

## 2019-04-17 DIAGNOSIS — M316 Other giant cell arteritis: Secondary | ICD-10-CM | POA: Diagnosis not present

## 2019-04-17 DIAGNOSIS — L89211 Pressure ulcer of right hip, stage 1: Secondary | ICD-10-CM | POA: Diagnosis not present

## 2019-04-17 DIAGNOSIS — H547 Unspecified visual loss: Secondary | ICD-10-CM | POA: Diagnosis not present

## 2019-04-17 DIAGNOSIS — K56600 Partial intestinal obstruction, unspecified as to cause: Secondary | ICD-10-CM | POA: Diagnosis not present

## 2019-04-17 DIAGNOSIS — Z7901 Long term (current) use of anticoagulants: Secondary | ICD-10-CM | POA: Diagnosis not present

## 2019-04-17 DIAGNOSIS — Z86711 Personal history of pulmonary embolism: Secondary | ICD-10-CM | POA: Diagnosis not present

## 2019-04-17 DIAGNOSIS — E039 Hypothyroidism, unspecified: Secondary | ICD-10-CM | POA: Diagnosis not present

## 2019-04-17 DIAGNOSIS — J690 Pneumonitis due to inhalation of food and vomit: Secondary | ICD-10-CM | POA: Diagnosis not present

## 2019-04-17 DIAGNOSIS — Z86718 Personal history of other venous thrombosis and embolism: Secondary | ICD-10-CM | POA: Diagnosis not present

## 2019-04-17 DIAGNOSIS — I5032 Chronic diastolic (congestive) heart failure: Secondary | ICD-10-CM | POA: Diagnosis not present

## 2019-04-19 DIAGNOSIS — I509 Heart failure, unspecified: Secondary | ICD-10-CM | POA: Diagnosis not present

## 2019-04-19 DIAGNOSIS — E46 Unspecified protein-calorie malnutrition: Secondary | ICD-10-CM | POA: Diagnosis not present

## 2019-04-19 DIAGNOSIS — N390 Urinary tract infection, site not specified: Secondary | ICD-10-CM | POA: Diagnosis not present

## 2019-04-19 DIAGNOSIS — D6869 Other thrombophilia: Secondary | ICD-10-CM | POA: Diagnosis not present

## 2019-04-19 DIAGNOSIS — I4891 Unspecified atrial fibrillation: Secondary | ICD-10-CM | POA: Diagnosis not present

## 2019-04-19 DIAGNOSIS — H548 Legal blindness, as defined in USA: Secondary | ICD-10-CM | POA: Diagnosis not present

## 2019-04-21 DIAGNOSIS — L89111 Pressure ulcer of right upper back, stage 1: Secondary | ICD-10-CM | POA: Diagnosis not present

## 2019-04-21 DIAGNOSIS — I5032 Chronic diastolic (congestive) heart failure: Secondary | ICD-10-CM | POA: Diagnosis not present

## 2019-04-21 DIAGNOSIS — I480 Paroxysmal atrial fibrillation: Secondary | ICD-10-CM | POA: Diagnosis not present

## 2019-04-21 DIAGNOSIS — K56600 Partial intestinal obstruction, unspecified as to cause: Secondary | ICD-10-CM | POA: Diagnosis not present

## 2019-04-21 DIAGNOSIS — L89211 Pressure ulcer of right hip, stage 1: Secondary | ICD-10-CM | POA: Diagnosis not present

## 2019-04-21 DIAGNOSIS — J690 Pneumonitis due to inhalation of food and vomit: Secondary | ICD-10-CM | POA: Diagnosis not present

## 2019-04-22 ENCOUNTER — Ambulatory Visit: Payer: Medicare Other | Admitting: Interventional Cardiology

## 2019-04-22 ENCOUNTER — Other Ambulatory Visit: Payer: Self-pay | Admitting: Interventional Cardiology

## 2019-04-22 NOTE — Telephone Encounter (Signed)
Last OV 04/01/19 86 years old Scr 1.11 on 03/17/19 49kg

## 2019-04-27 DIAGNOSIS — R001 Bradycardia, unspecified: Secondary | ICD-10-CM | POA: Diagnosis not present

## 2019-04-28 ENCOUNTER — Encounter: Payer: Self-pay | Admitting: Podiatry

## 2019-04-28 ENCOUNTER — Other Ambulatory Visit: Payer: Self-pay

## 2019-04-28 ENCOUNTER — Telehealth: Payer: Self-pay | Admitting: Interventional Cardiology

## 2019-04-28 ENCOUNTER — Ambulatory Visit (INDEPENDENT_AMBULATORY_CARE_PROVIDER_SITE_OTHER): Payer: Medicare Other | Admitting: Podiatry

## 2019-04-28 VITALS — Temp 97.9°F

## 2019-04-28 DIAGNOSIS — B351 Tinea unguium: Secondary | ICD-10-CM

## 2019-04-28 DIAGNOSIS — N184 Chronic kidney disease, stage 4 (severe): Secondary | ICD-10-CM | POA: Diagnosis not present

## 2019-04-28 DIAGNOSIS — D689 Coagulation defect, unspecified: Secondary | ICD-10-CM

## 2019-04-28 DIAGNOSIS — M79676 Pain in unspecified toe(s): Secondary | ICD-10-CM

## 2019-04-28 NOTE — Progress Notes (Signed)
This patient returns to my office for at risk foot care.  This patient requires this care by a professional since this patient will be at risk due to having chronic kidney disease and coagulation defect.  Patient is taking eliquiss.  This patient is unable to cut nails herself since the patient cannot reach her nails.These nails are painful walking and wearing shoes.  This patient presents for at risk foot care today. She presents to the office with her daughter and is in a wheel chair.  General Appearance  Alert, conversant and in no acute stress.  Vascular  Dorsalis pedis and posterior tibial  pulses are weakly  palpable  bilaterally.  Capillary return is within normal limits  bilaterally. Temperature is within normal limits  Bilaterally. Purplish discoloration feet  B/L.  Neurologic  Senn-Weinstein monofilament wire test diminished   bilaterally. Muscle power within normal limits bilaterally.  Nails Thick disfigured discolored nails with subungual debris  from hallux to fifth toes bilaterally. No evidence of bacterial infection or drainage bilaterally.  Orthopedic  No limitations of motion  feet .  No crepitus or effusions noted.  No bony pathology or digital deformities noted.  Skin  normotropic skin with no porokeratosis noted bilaterally.  No signs of infections or ulcers noted.     Onychomycosis  Pain in right toes  Pain in left toes  Consent was obtained for treatment procedures.   Mechanical debridement of nails 1-5  bilaterally performed with a nail nipper.  Filed with dremel without incident.    Return office visit  9 weeks                    Told patient to return for periodic foot care and evaluation due to potential at risk complications.   Gardiner Barefoot DPM

## 2019-04-28 NOTE — Telephone Encounter (Signed)
Spoke with daughter and made her aware that monitor was downloaded this morning.  Advised I would call with results as soon as Dr. [name] was able to look over them.  Daughter appreciative for call.

## 2019-04-28 NOTE — Telephone Encounter (Signed)
New message   Patient's daughter is calling to get results of heart monitor. Please call.

## 2019-04-29 DIAGNOSIS — L89211 Pressure ulcer of right hip, stage 1: Secondary | ICD-10-CM | POA: Diagnosis not present

## 2019-04-29 DIAGNOSIS — K56600 Partial intestinal obstruction, unspecified as to cause: Secondary | ICD-10-CM | POA: Diagnosis not present

## 2019-04-29 DIAGNOSIS — L89111 Pressure ulcer of right upper back, stage 1: Secondary | ICD-10-CM | POA: Diagnosis not present

## 2019-04-29 DIAGNOSIS — I5032 Chronic diastolic (congestive) heart failure: Secondary | ICD-10-CM | POA: Diagnosis not present

## 2019-04-29 DIAGNOSIS — J690 Pneumonitis due to inhalation of food and vomit: Secondary | ICD-10-CM | POA: Diagnosis not present

## 2019-04-29 DIAGNOSIS — I480 Paroxysmal atrial fibrillation: Secondary | ICD-10-CM | POA: Diagnosis not present

## 2019-05-05 ENCOUNTER — Encounter: Payer: Self-pay | Admitting: Interventional Cardiology

## 2019-05-05 NOTE — Telephone Encounter (Signed)
error 

## 2019-05-05 NOTE — Telephone Encounter (Signed)
Her monitor is in your basket.  Can you look at that please?

## 2019-05-05 NOTE — Telephone Encounter (Signed)
Patient's daughter calling to follow up on the heart monitor results.

## 2019-05-06 DIAGNOSIS — J690 Pneumonitis due to inhalation of food and vomit: Secondary | ICD-10-CM | POA: Diagnosis not present

## 2019-05-06 DIAGNOSIS — L89111 Pressure ulcer of right upper back, stage 1: Secondary | ICD-10-CM | POA: Diagnosis not present

## 2019-05-06 DIAGNOSIS — K56600 Partial intestinal obstruction, unspecified as to cause: Secondary | ICD-10-CM | POA: Diagnosis not present

## 2019-05-06 DIAGNOSIS — L89211 Pressure ulcer of right hip, stage 1: Secondary | ICD-10-CM | POA: Diagnosis not present

## 2019-05-06 DIAGNOSIS — I480 Paroxysmal atrial fibrillation: Secondary | ICD-10-CM | POA: Diagnosis not present

## 2019-05-06 DIAGNOSIS — I5032 Chronic diastolic (congestive) heart failure: Secondary | ICD-10-CM | POA: Diagnosis not present

## 2019-05-06 NOTE — Telephone Encounter (Signed)
Monitor was read yesterday.  No action is required.

## 2019-05-06 NOTE — Telephone Encounter (Signed)
Left detailed message with results.  Ok per [employer]. Advised to call back if any questions.

## 2019-05-11 DIAGNOSIS — J690 Pneumonitis due to inhalation of food and vomit: Secondary | ICD-10-CM | POA: Diagnosis not present

## 2019-05-11 DIAGNOSIS — I5032 Chronic diastolic (congestive) heart failure: Secondary | ICD-10-CM | POA: Diagnosis not present

## 2019-05-11 DIAGNOSIS — L89211 Pressure ulcer of right hip, stage 1: Secondary | ICD-10-CM | POA: Diagnosis not present

## 2019-05-11 DIAGNOSIS — K56600 Partial intestinal obstruction, unspecified as to cause: Secondary | ICD-10-CM | POA: Diagnosis not present

## 2019-05-11 DIAGNOSIS — I480 Paroxysmal atrial fibrillation: Secondary | ICD-10-CM | POA: Diagnosis not present

## 2019-05-11 DIAGNOSIS — L89111 Pressure ulcer of right upper back, stage 1: Secondary | ICD-10-CM | POA: Diagnosis not present

## 2019-05-14 DIAGNOSIS — K56600 Partial intestinal obstruction, unspecified as to cause: Secondary | ICD-10-CM | POA: Diagnosis not present

## 2019-05-14 DIAGNOSIS — L89211 Pressure ulcer of right hip, stage 1: Secondary | ICD-10-CM | POA: Diagnosis not present

## 2019-05-14 DIAGNOSIS — L89111 Pressure ulcer of right upper back, stage 1: Secondary | ICD-10-CM | POA: Diagnosis not present

## 2019-05-14 DIAGNOSIS — J690 Pneumonitis due to inhalation of food and vomit: Secondary | ICD-10-CM | POA: Diagnosis not present

## 2019-05-14 DIAGNOSIS — I480 Paroxysmal atrial fibrillation: Secondary | ICD-10-CM | POA: Diagnosis not present

## 2019-05-14 DIAGNOSIS — I5032 Chronic diastolic (congestive) heart failure: Secondary | ICD-10-CM | POA: Diagnosis not present

## 2019-05-17 DIAGNOSIS — Z8701 Personal history of pneumonia (recurrent): Secondary | ICD-10-CM | POA: Diagnosis not present

## 2019-05-17 DIAGNOSIS — E039 Hypothyroidism, unspecified: Secondary | ICD-10-CM | POA: Diagnosis not present

## 2019-05-17 DIAGNOSIS — Z7901 Long term (current) use of anticoagulants: Secondary | ICD-10-CM | POA: Diagnosis not present

## 2019-05-17 DIAGNOSIS — K529 Noninfective gastroenteritis and colitis, unspecified: Secondary | ICD-10-CM | POA: Diagnosis not present

## 2019-05-17 DIAGNOSIS — I5032 Chronic diastolic (congestive) heart failure: Secondary | ICD-10-CM | POA: Diagnosis not present

## 2019-05-17 DIAGNOSIS — I700 Atherosclerosis of aorta: Secondary | ICD-10-CM | POA: Diagnosis not present

## 2019-05-17 DIAGNOSIS — N1832 Chronic kidney disease, stage 3b: Secondary | ICD-10-CM | POA: Diagnosis not present

## 2019-05-17 DIAGNOSIS — Z86718 Personal history of other venous thrombosis and embolism: Secondary | ICD-10-CM | POA: Diagnosis not present

## 2019-05-17 DIAGNOSIS — M48061 Spinal stenosis, lumbar region without neurogenic claudication: Secondary | ICD-10-CM | POA: Diagnosis not present

## 2019-05-17 DIAGNOSIS — Z9181 History of falling: Secondary | ICD-10-CM | POA: Diagnosis not present

## 2019-05-17 DIAGNOSIS — Z7952 Long term (current) use of systemic steroids: Secondary | ICD-10-CM | POA: Diagnosis not present

## 2019-05-17 DIAGNOSIS — Z86711 Personal history of pulmonary embolism: Secondary | ICD-10-CM | POA: Diagnosis not present

## 2019-05-17 DIAGNOSIS — M316 Other giant cell arteritis: Secondary | ICD-10-CM | POA: Diagnosis not present

## 2019-05-17 DIAGNOSIS — H547 Unspecified visual loss: Secondary | ICD-10-CM | POA: Diagnosis not present

## 2019-05-19 DIAGNOSIS — M48061 Spinal stenosis, lumbar region without neurogenic claudication: Secondary | ICD-10-CM | POA: Diagnosis not present

## 2019-05-19 DIAGNOSIS — I700 Atherosclerosis of aorta: Secondary | ICD-10-CM | POA: Diagnosis not present

## 2019-05-19 DIAGNOSIS — N1832 Chronic kidney disease, stage 3b: Secondary | ICD-10-CM | POA: Diagnosis not present

## 2019-05-19 DIAGNOSIS — I5032 Chronic diastolic (congestive) heart failure: Secondary | ICD-10-CM | POA: Diagnosis not present

## 2019-05-19 DIAGNOSIS — E039 Hypothyroidism, unspecified: Secondary | ICD-10-CM | POA: Diagnosis not present

## 2019-05-19 DIAGNOSIS — M316 Other giant cell arteritis: Secondary | ICD-10-CM | POA: Diagnosis not present

## 2019-05-26 DIAGNOSIS — H21231 Degeneration of iris (pigmentary), right eye: Secondary | ICD-10-CM | POA: Diagnosis not present

## 2019-05-26 DIAGNOSIS — H353213 Exudative age-related macular degeneration, right eye, with inactive scar: Secondary | ICD-10-CM | POA: Diagnosis not present

## 2019-05-26 DIAGNOSIS — H2522 Age-related cataract, morgagnian type, left eye: Secondary | ICD-10-CM | POA: Diagnosis not present

## 2019-05-26 DIAGNOSIS — H35361 Drusen (degenerative) of macula, right eye: Secondary | ICD-10-CM | POA: Diagnosis not present

## 2019-06-01 DIAGNOSIS — M316 Other giant cell arteritis: Secondary | ICD-10-CM | POA: Diagnosis not present

## 2019-06-01 DIAGNOSIS — M48061 Spinal stenosis, lumbar region without neurogenic claudication: Secondary | ICD-10-CM | POA: Diagnosis not present

## 2019-06-01 DIAGNOSIS — I700 Atherosclerosis of aorta: Secondary | ICD-10-CM | POA: Diagnosis not present

## 2019-06-01 DIAGNOSIS — N1832 Chronic kidney disease, stage 3b: Secondary | ICD-10-CM | POA: Diagnosis not present

## 2019-06-01 DIAGNOSIS — E039 Hypothyroidism, unspecified: Secondary | ICD-10-CM | POA: Diagnosis not present

## 2019-06-01 DIAGNOSIS — I5032 Chronic diastolic (congestive) heart failure: Secondary | ICD-10-CM | POA: Diagnosis not present

## 2019-06-03 DIAGNOSIS — M5136 Other intervertebral disc degeneration, lumbar region: Secondary | ICD-10-CM | POA: Diagnosis not present

## 2019-06-03 DIAGNOSIS — M25552 Pain in left hip: Secondary | ICD-10-CM | POA: Diagnosis not present

## 2019-06-03 DIAGNOSIS — M545 Low back pain: Secondary | ICD-10-CM | POA: Diagnosis not present

## 2019-06-07 ENCOUNTER — Ambulatory Visit
Admission: RE | Admit: 2019-06-07 | Discharge: 2019-06-07 | Disposition: A | Discharge status: Home / Self Care | Payer: Medicare Other | Source: Ambulatory Visit | Attending: Sports Medicine | Admitting: Sports Medicine

## 2019-06-07 ENCOUNTER — Other Ambulatory Visit: Payer: Self-pay | Admitting: Sports Medicine

## 2019-06-07 DIAGNOSIS — M545 Low back pain, unspecified: Secondary | ICD-10-CM

## 2019-06-07 DIAGNOSIS — M48061 Spinal stenosis, lumbar region without neurogenic claudication: Secondary | ICD-10-CM | POA: Diagnosis not present

## 2019-06-07 DIAGNOSIS — M25552 Pain in left hip: Secondary | ICD-10-CM

## 2019-06-07 DIAGNOSIS — M1612 Unilateral primary osteoarthritis, left hip: Secondary | ICD-10-CM | POA: Diagnosis not present

## 2019-06-10 DIAGNOSIS — M48061 Spinal stenosis, lumbar region without neurogenic claudication: Secondary | ICD-10-CM | POA: Diagnosis not present

## 2019-06-10 DIAGNOSIS — I714 Abdominal aortic aneurysm, without rupture: Secondary | ICD-10-CM | POA: Diagnosis not present

## 2019-06-15 DIAGNOSIS — M5136 Other intervertebral disc degeneration, lumbar region: Secondary | ICD-10-CM | POA: Diagnosis not present

## 2019-06-16 DIAGNOSIS — K529 Noninfective gastroenteritis and colitis, unspecified: Secondary | ICD-10-CM | POA: Diagnosis not present

## 2019-06-16 DIAGNOSIS — Z9181 History of falling: Secondary | ICD-10-CM | POA: Diagnosis not present

## 2019-06-16 DIAGNOSIS — E039 Hypothyroidism, unspecified: Secondary | ICD-10-CM | POA: Diagnosis not present

## 2019-06-16 DIAGNOSIS — I5032 Chronic diastolic (congestive) heart failure: Secondary | ICD-10-CM | POA: Diagnosis not present

## 2019-06-16 DIAGNOSIS — I700 Atherosclerosis of aorta: Secondary | ICD-10-CM | POA: Diagnosis not present

## 2019-06-16 DIAGNOSIS — M48061 Spinal stenosis, lumbar region without neurogenic claudication: Secondary | ICD-10-CM | POA: Diagnosis not present

## 2019-06-16 DIAGNOSIS — Z8701 Personal history of pneumonia (recurrent): Secondary | ICD-10-CM | POA: Diagnosis not present

## 2019-06-16 DIAGNOSIS — N1832 Chronic kidney disease, stage 3b: Secondary | ICD-10-CM | POA: Diagnosis not present

## 2019-06-16 DIAGNOSIS — Z7952 Long term (current) use of systemic steroids: Secondary | ICD-10-CM | POA: Diagnosis not present

## 2019-06-16 DIAGNOSIS — Z86718 Personal history of other venous thrombosis and embolism: Secondary | ICD-10-CM | POA: Diagnosis not present

## 2019-06-16 DIAGNOSIS — M316 Other giant cell arteritis: Secondary | ICD-10-CM | POA: Diagnosis not present

## 2019-06-16 DIAGNOSIS — Z7901 Long term (current) use of anticoagulants: Secondary | ICD-10-CM | POA: Diagnosis not present

## 2019-06-16 DIAGNOSIS — H547 Unspecified visual loss: Secondary | ICD-10-CM | POA: Diagnosis not present

## 2019-06-16 DIAGNOSIS — Z86711 Personal history of pulmonary embolism: Secondary | ICD-10-CM | POA: Diagnosis not present

## 2019-06-18 DIAGNOSIS — I700 Atherosclerosis of aorta: Secondary | ICD-10-CM | POA: Diagnosis not present

## 2019-06-18 DIAGNOSIS — E039 Hypothyroidism, unspecified: Secondary | ICD-10-CM | POA: Diagnosis not present

## 2019-06-18 DIAGNOSIS — I5032 Chronic diastolic (congestive) heart failure: Secondary | ICD-10-CM | POA: Diagnosis not present

## 2019-06-18 DIAGNOSIS — N1832 Chronic kidney disease, stage 3b: Secondary | ICD-10-CM | POA: Diagnosis not present

## 2019-06-18 DIAGNOSIS — M316 Other giant cell arteritis: Secondary | ICD-10-CM | POA: Diagnosis not present

## 2019-06-18 DIAGNOSIS — M48061 Spinal stenosis, lumbar region without neurogenic claudication: Secondary | ICD-10-CM | POA: Diagnosis not present

## 2019-06-23 DIAGNOSIS — N1831 Chronic kidney disease, stage 3a: Secondary | ICD-10-CM | POA: Diagnosis not present

## 2019-06-23 DIAGNOSIS — K6289 Other specified diseases of anus and rectum: Secondary | ICD-10-CM | POA: Diagnosis not present

## 2019-06-23 DIAGNOSIS — I480 Paroxysmal atrial fibrillation: Secondary | ICD-10-CM | POA: Diagnosis not present

## 2019-06-23 DIAGNOSIS — Z79899 Other long term (current) drug therapy: Secondary | ICD-10-CM | POA: Diagnosis not present

## 2019-06-23 DIAGNOSIS — M5432 Sciatica, left side: Secondary | ICD-10-CM | POA: Diagnosis not present

## 2019-06-23 DIAGNOSIS — R739 Hyperglycemia, unspecified: Secondary | ICD-10-CM | POA: Diagnosis not present

## 2019-06-23 DIAGNOSIS — R300 Dysuria: Secondary | ICD-10-CM | POA: Diagnosis not present

## 2019-06-30 DIAGNOSIS — D6869 Other thrombophilia: Secondary | ICD-10-CM | POA: Diagnosis not present

## 2019-06-30 DIAGNOSIS — E871 Hypo-osmolality and hyponatremia: Secondary | ICD-10-CM | POA: Diagnosis not present

## 2019-06-30 DIAGNOSIS — D7589 Other specified diseases of blood and blood-forming organs: Secondary | ICD-10-CM | POA: Diagnosis not present

## 2019-06-30 DIAGNOSIS — E46 Unspecified protein-calorie malnutrition: Secondary | ICD-10-CM | POA: Diagnosis not present

## 2019-06-30 DIAGNOSIS — I480 Paroxysmal atrial fibrillation: Secondary | ICD-10-CM | POA: Diagnosis not present

## 2019-06-30 DIAGNOSIS — I5032 Chronic diastolic (congestive) heart failure: Secondary | ICD-10-CM | POA: Diagnosis not present

## 2019-07-01 DIAGNOSIS — M5416 Radiculopathy, lumbar region: Secondary | ICD-10-CM | POA: Diagnosis not present

## 2019-07-02 DIAGNOSIS — N1832 Chronic kidney disease, stage 3b: Secondary | ICD-10-CM | POA: Diagnosis not present

## 2019-07-02 DIAGNOSIS — E039 Hypothyroidism, unspecified: Secondary | ICD-10-CM | POA: Diagnosis not present

## 2019-07-02 DIAGNOSIS — M48061 Spinal stenosis, lumbar region without neurogenic claudication: Secondary | ICD-10-CM | POA: Diagnosis not present

## 2019-07-02 DIAGNOSIS — M316 Other giant cell arteritis: Secondary | ICD-10-CM | POA: Diagnosis not present

## 2019-07-02 DIAGNOSIS — I700 Atherosclerosis of aorta: Secondary | ICD-10-CM | POA: Diagnosis not present

## 2019-07-02 DIAGNOSIS — I5032 Chronic diastolic (congestive) heart failure: Secondary | ICD-10-CM | POA: Diagnosis not present

## 2019-07-06 ENCOUNTER — Telehealth: Payer: Self-pay | Admitting: Interventional Cardiology

## 2019-07-06 ENCOUNTER — Ambulatory Visit: Payer: Medicare Other | Admitting: Podiatry

## 2019-07-06 ENCOUNTER — Other Ambulatory Visit: Payer: Self-pay | Admitting: Gastroenterology

## 2019-07-06 DIAGNOSIS — R1084 Generalized abdominal pain: Secondary | ICD-10-CM

## 2019-07-06 NOTE — Telephone Encounter (Signed)
Left message for daughter letting her know that our office has not sent any MyChart messages.  Advised it is likely the pt's health maintenance records due to overdue vaccines.  Advised to call back if any questions.

## 2019-07-06 NOTE — Telephone Encounter (Signed)
Patient's daughter states that she keeps getting a Therapist, music. She wants to know if it is from our office. She states that she believes it would only be for her mom and we are the only [facility] doctor that she has. I don't see any messages or notes put in.

## 2019-07-08 ENCOUNTER — Other Ambulatory Visit (HOSPITAL_COMMUNITY): Payer: Self-pay | Admitting: Gastroenterology

## 2019-07-08 DIAGNOSIS — R1084 Generalized abdominal pain: Secondary | ICD-10-CM

## 2019-07-12 ENCOUNTER — Ambulatory Visit
Admission: RE | Admit: 2019-07-12 | Discharge: 2019-07-12 | Disposition: A | Discharge status: Home / Self Care | Payer: Medicare Other | Source: Ambulatory Visit | Attending: Gastroenterology | Admitting: Gastroenterology

## 2019-07-12 ENCOUNTER — Other Ambulatory Visit: Payer: Self-pay

## 2019-07-12 DIAGNOSIS — R1084 Generalized abdominal pain: Secondary | ICD-10-CM

## 2019-07-12 MED ORDER — IOPAMIDOL (ISOVUE-300) INJECTION 61%
100.0000 mL | Freq: Once | INTRAVENOUS | Status: AC | PRN
  Administered 2019-07-12: 100 mL via INTRAVENOUS

## 2019-07-14 ENCOUNTER — Telehealth: Payer: Self-pay | Admitting: Interventional Cardiology

## 2019-07-14 NOTE — Telephone Encounter (Signed)
New Message:   Daughter wants to know if it is alright for pt to take Marinol along with her other medicine please?

## 2019-07-14 NOTE — Telephone Encounter (Signed)
Spoke with Dr. [name] and he said ok to use Marinol.  Spoke with Mardene Celeste and made her aware.  She was thankful for the call.

## 2019-07-15 ENCOUNTER — Encounter (HOSPITAL_COMMUNITY): Payer: Self-pay

## 2019-07-15 ENCOUNTER — Ambulatory Visit (HOSPITAL_COMMUNITY): Payer: Medicare Other

## 2019-07-15 DIAGNOSIS — I5032 Chronic diastolic (congestive) heart failure: Secondary | ICD-10-CM | POA: Diagnosis not present

## 2019-07-15 DIAGNOSIS — E039 Hypothyroidism, unspecified: Secondary | ICD-10-CM | POA: Diagnosis not present

## 2019-07-15 DIAGNOSIS — M316 Other giant cell arteritis: Secondary | ICD-10-CM | POA: Diagnosis not present

## 2019-07-15 DIAGNOSIS — N1832 Chronic kidney disease, stage 3b: Secondary | ICD-10-CM | POA: Diagnosis not present

## 2019-07-15 DIAGNOSIS — I700 Atherosclerosis of aorta: Secondary | ICD-10-CM | POA: Diagnosis not present

## 2019-07-15 DIAGNOSIS — M48061 Spinal stenosis, lumbar region without neurogenic claudication: Secondary | ICD-10-CM | POA: Diagnosis not present

## 2019-07-22 ENCOUNTER — Other Ambulatory Visit: Payer: Medicare Other

## 2019-07-22 DIAGNOSIS — R351 Nocturia: Secondary | ICD-10-CM | POA: Diagnosis not present

## 2019-07-22 DIAGNOSIS — R8279 Other abnormal findings on microbiological examination of urine: Secondary | ICD-10-CM | POA: Diagnosis not present

## 2019-07-22 DIAGNOSIS — M545 Low back pain: Secondary | ICD-10-CM | POA: Diagnosis not present

## 2019-07-22 DIAGNOSIS — R278 Other lack of coordination: Secondary | ICD-10-CM | POA: Diagnosis not present

## 2019-07-28 ENCOUNTER — Ambulatory Visit: Payer: Medicare Other | Admitting: Podiatry

## 2019-07-30 DIAGNOSIS — R110 Nausea: Secondary | ICD-10-CM | POA: Diagnosis not present

## 2019-07-30 DIAGNOSIS — I480 Paroxysmal atrial fibrillation: Secondary | ICD-10-CM | POA: Diagnosis not present

## 2019-07-30 DIAGNOSIS — I5032 Chronic diastolic (congestive) heart failure: Secondary | ICD-10-CM | POA: Diagnosis not present

## 2019-07-31 DIAGNOSIS — M5136 Other intervertebral disc degeneration, lumbar region: Secondary | ICD-10-CM | POA: Diagnosis not present

## 2019-08-04 DIAGNOSIS — R351 Nocturia: Secondary | ICD-10-CM | POA: Diagnosis not present

## 2019-08-04 DIAGNOSIS — R3982 Chronic bladder pain: Secondary | ICD-10-CM | POA: Diagnosis not present

## 2019-08-08 ENCOUNTER — Telehealth: Payer: Self-pay | Admitting: Medical

## 2019-08-08 NOTE — Telephone Encounter (Signed)
   Patient's daughter, [name], called the after hours line to report that her mother has been experiencing low blood pressures. She reports over the past two days her blood pressure readings have been 89/47 around 11 am yesterday, as low as 72/43, then 81/44 this morning, and most recently 102/52. She feels woozy when her blood pressures are low. No complaints of chest pain, SOB, palpitations, or syncope. She was recently started on marinol for management of nausea and poor po intake. Additionally was prescribed PR valium for management of bladder spasms - her first dose was Friday night. Prior to Friday she had rare trouble with hypotension, at worse SBP was in the 90s. Suspect this is a result of the valium. Recommended avoiding for the time being and to follow-up with PCP/urology for alternatives. If hypotension persists, could consider midodrine. Daughter also asks if amiodarone could be contributing to her nausea. This is certainly a possibility. She is hopeful we can arrange an in office visit to discuss possible discontinuation of her amiodarone. I will route to scheduling to help arrange follow-up. Daughter was in agreement with the plan and appreciative of the call.  Abigail Butts, PA-C 08/08/19; 4:17 PM

## 2019-08-09 NOTE — Telephone Encounter (Signed)
Patient's daughter called wanting to speak to Dr. Thompson Caul nurse.

## 2019-08-09 NOTE — Telephone Encounter (Signed)
Spoke with daughter and she states they called due to hypotension.  Pt was given her first dose of Valium 2.5mg  suppository on Friday and had low BPs all weekend.  BP improved today to 114/56.  Has not had any further Valium.  Pt seen by new PCP, Dr. Felipa Eth, recently.  He recommended they reach out to [place] about Amiodarone possibly being the culprit for pt's nausea since other possibilities have been eliminated.  Daughter would really like to bring pt in as she is almost due for f/u anyway.  Scheduled pt to come in Friday, July 30th, for evaluation.  Daughter appreciative for call.

## 2019-08-10 NOTE — Telephone Encounter (Signed)
Late entry:   Spoke this morning to the pts daughter and she agreed to bring the pt in tomorrow instead of Friday 08/13/19 to see Dr. [name] due to a new opening in his schedule.

## 2019-08-10 NOTE — Progress Notes (Signed)
Cardiology Office Note:    Date:  08/11/2019   ID:  [name], DOB Jan 03, 1921, MRN [id number]  PCP:  Antony Contras, MD  Cardiologist:  Sinclair Grooms, MD   Referring MD: Antony Contras, MD   Chief Complaint  Patient presents with  . Atrial Fibrillation  . Coronary Artery Disease    History of Present Illness:    [name] is a 86 y.o. female with a hx of paroxysmal atrial fibrillation, chronic amiodarone therapy, history of PE on chronic anticoagulation with Eliquis 2.5 mg twice daily, CKD stage II, recurrent SBO, and left foot swelling and erythema/blanching. ? PAD Dr.Segal.  [name] is having recurring nausea.  There have also been some blood pressure fluctuations.  She is accompanied by the daughter who has concern about whether the nausea could be related to amiodarone.  This is a possibility although she has been off the medication for quite some time and nausea is relatively new over the last 6 to 8 weeks.  She has had blood pressures under 409 mmHg systolic and they tend to run relatively low in the morning.  Late in the day and prior to bedtime can be as high as 811 mmHg systolic.  Many of the elevated blood pressures in the setting of pain related to her back and neuropathy.  The daughter wonders if Marinol will cause her to have any heart problem.  She is taking 2.5 mg twice daily.  She was reassured that this would not cause any particular cardiac problem.  Past Medical History:  Diagnosis Date  . Blindness   . Blood clot associated with vein wall inflammation   . CKD (chronic kidney disease), stage III   . Colitis   . Goiter    s/p radioactive iod. tx  . Hypothyroidism   . On amiodarone therapy   . Paroxysmal atrial fibrillation (HCC)   . Pulmonary embolus (HCC)    and DVT s/p IVC filter  . SBO (small bowel obstruction) ([place])   . Spinal stenosis of lumbar region with radiculopathy    possible neurogenic claudication recently?  . Temporal arteritis  [facility])     Past Surgical History:  Procedure Laterality Date  . APPENDECTOMY  1940  . BACK SURGERY  04/1998   spinal stenosis  . BACK SURGERY  2000   Ruptured Disc  . BREAST BIOPSY     cysts in both breasts  . CARPAL TUNNEL RELEASE  2002  . ivc filter    . PARTIAL HYSTERECTOMY    . TONSILLECTOMY  1934  . TOTAL KNEE ARTHROPLASTY Left 04/1999  . TOTAL KNEE ARTHROPLASTY Right 02/2000    Current Medications: Current Meds  Medication Sig  . acetaminophen (TYLENOL) 500 MG tablet Take 500-1,000 mg by mouth See admin instructions. Take 500mg  in the morning, 500mg  midday and 1000mg  at bedtime.  Marland Kitchen amiodarone (PACERONE) 200 MG tablet Take 1 tablet (200 mg total) by mouth daily.  . ASPERCREME LIDOCAINE EX Apply 1 application topically as needed (muscle pain).  . Calcium Carbonate-Vitamin D (CALCIUM HIGH POTENCY/VITAMIN D) 600-200 MG-UNIT TABS Take 1 tablet by mouth in the morning and at bedtime.   Marland Kitchen CRANBERRY EXTRACT PO Take 2 tablets by mouth 2 (two) times daily.   Marland Kitchen dronabinol (MARINOL) 2.5 MG capsule Take 2.5 mg by mouth at bedtime.  Marland Kitchen ELIQUIS 2.5 MG TABS tablet TAKE 1 TABLET BY MOUTH TWICE DAILY.  Marland Kitchen gabapentin (NEURONTIN) 100 MG capsule Take 100 mg by mouth 4 (  four) times daily.   Marland Kitchen levothyroxine (SYNTHROID, LEVOTHROID) 150 MCG tablet Take 150 mcg by mouth daily before breakfast.  . lubiprostone (AMITIZA) 8 MCG capsule Take 8 mcg by mouth 3 (three) times daily with meals.   . Misc Natural Products (GLUCOSAMINE CHONDROITIN COMPLX) TABS Take 1 tablet by mouth 2 (two) times daily.   . Multiple Vitamins-Minerals (OCUVITE ADULT 50+ PO) Take 1 tablet by mouth daily.  . nitrofurantoin (MACRODANTIN) 50 MG capsule Take 50 mg by mouth daily.  . predniSONE (DELTASONE) 5 MG tablet Take 5 mg by mouth daily.   . Probiotic Product (ULTRAFLORA IMMUNE HEALTH PO) Take 1 tablet by mouth daily.   . traMADol (ULTRAM) 50 MG tablet Take 50 mg by mouth daily as needed for moderate pain.      Allergies:    Hydrocortisone, Sulfa antibiotics, Cefdinir, Cortisone, Prednisone, Sulfasalazine, and Sulfonamide derivatives   Social History   Socioeconomic History  . Marital status: Widowed    Spouse name: Not on file  . Number of children: 4  . Years of education: College  . Highest education level: Not on file  Occupational History    Employer: RETIRED    Comment: retired  Tobacco Use  . Smoking status: Never Smoker  . Smokeless tobacco: Never Used  Vaping Use  . Vaping Use: Never assessed  Substance and Sexual Activity  . Alcohol use: No  . Drug use: No  . Sexual activity: Never  Other Topics Concern  . Not on file  Social History Narrative   Patient lives at home alone.  Use walker to get around   Caffeine Use: rarely   Patient is right handed   Social Determinants of Health   Financial Resource Strain:   . Difficulty of Paying Living Expenses:   Food Insecurity:   . Worried About Charity fundraiser in the Last Year:   . Arboriculturist in the Last Year:   Transportation Needs:   . Film/video editor (Medical):   Marland Kitchen Lack of Transportation (Non-Medical):   Physical Activity:   . Days of Exercise per Week:   . Minutes of Exercise per Session:   Stress:   . Feeling of Stress :   Social Connections:   . Frequency of Communication with Friends and Family:   . Frequency of Social Gatherings with Friends and Family:   . Attends Religious Services:   . Active Member of Clubs or Organizations:   . Attends Archivist Meetings:   Marland Kitchen Marital Status:      Family History: The patient's family history includes Breast cancer in her sister; Congestive Heart Failure in her mother; Stroke in her father. There is no history of Crohn's disease or Ulcerative colitis.  ROS:   Please see the history of present illness.    Multiple complaints, back pain, poor appetite, all other systems reviewed and are negative.  EKGs/Labs/Other Studies Reviewed:    The following studies  were reviewed today:  Abdominal CT scan June 2021:  IMPRESSION: 1. No acute findings are noted in the abdomen or pelvis to account for the patient's symptoms. 2. Small bilateral pleural effusions lying dependently. 3. Aortic atherosclerosis, in addition to left anterior descending coronary artery disease. There is also aneurysmal dilatation of the descending thoracic aorta measuring up to 4.4 cm in diameter. 4. Cholelithiasis without evidence of acute cholecystitis at this time. 5. Colonic diverticulosis without evidence of acute diverticulitis at this time. EKG:  EKG no new data  Recent Labs: 09/12/2018: Magnesium 2.0; TSH 2.264 03/15/2019: ALT 12 03/17/2019: BUN 18; Creatinine, Ser 1.11; Hemoglobin 11.1; Platelets 229; Potassium 3.8; Sodium 132  Recent Lipid Panel No results found for: CHOL, TRIG, HDL, CHOLHDL, VLDL, LDLCALC, LDLDIRECT  Physical Exam:    VS:  BP (!) 148/66   Pulse 64   Ht 5\' 4"  (1.626 m)   Wt 107 lb 3.2 oz (48.6 kg)   SpO2 97%   BMI 18.40 kg/m     Wt Readings from Last 3 Encounters:  08/11/19 107 lb 3.2 oz (48.6 kg)  04/01/19 108 lb (49 kg)  03/14/19 113 lb 5.1 oz (51.4 kg)     GEN: Elderly and frail.  Blind.. No acute distress HEENT: Normal NECK: No JVD. LYMPHATICS: No lymphadenopathy CARDIAC:  RRR without murmur, gallop, or edema. VASCULAR:  Normal Pulses. No bruits. RESPIRATORY:  Clear to auscultation without rales, wheezing or rhonchi  ABDOMEN: Soft, non-tender, non-distended, No pulsatile mass, MUSCULOSKELETAL: No deformity  SKIN: Warm and dry NEUROLOGIC:  Alert and oriented x 3 PSYCHIATRIC:  Normal affect   ASSESSMENT:    1. Paroxysmal atrial fibrillation (HCC)   2. Chronic diastolic heart failure ([place])   3. CKD (chronic kidney disease), stage IV ([place])   4. Anticoagulated on warfarin   5. Encounter for monitoring amiodarone therapy    PLAN:    In order of problems listed above:  1. Normal sinus rhythm 2. No volume  overload 3. Kidney function not reassessed 4. Continue Eliquis 2.5 mg twice daily 5. COVID-19 vaccine has been received.  We discussed discontinuing amiodarone to see if nausea improves.  Ultimately decided to leave the medication as it is.  I am uncertain of the cause for nausea.  Marinol should be okay from cardiac standpoint.   Medication Adjustments/Labs and Tests Ordered: Current medicines are reviewed at length with the patient today.  Concerns regarding medicines are outlined above.  No orders of the defined types were placed in this encounter.  No orders of the defined types were placed in this encounter.   Patient Instructions  Medication Instructions:  Your physician recommends that you continue on your current medications as directed. Please refer to the Current Medication list given to you today.  *If you need a refill on your cardiac medications before your next appointment, please call your pharmacy*   Lab Work: None If you have labs (blood work) drawn today and your tests are completely normal, you will receive your results only by: Marland Kitchen MyChart Message (if you have MyChart) OR . A paper copy in the mail If you have any lab test that is abnormal or we need to change your treatment, we will call you to review the results.   Testing/Procedures: None   Follow-Up: At [facility], you and your health needs are our priority.  As part of our continuing mission to provide you with exceptional heart care, we have created designated Provider Care Teams.  These Care Teams include your primary Cardiologist (physician) and Advanced Practice Providers (APPs -  Physician Assistants and Nurse Practitioners) who all work together to provide you with the care you need, when you need it.    Your next appointment:   6 month(s)  The format for your next appointment:   In Person  Provider:   You may see Sinclair Grooms, MD or one of the following Advanced Practice Providers  on your designated Care Team:    Truitt Merle, NP  Cecilie Kicks, NP  Kathyrn Drown, NP        Signed, Sinclair Grooms, MD  08/11/2019 4:31 PM    [facility]

## 2019-08-11 ENCOUNTER — Ambulatory Visit (INDEPENDENT_AMBULATORY_CARE_PROVIDER_SITE_OTHER): Payer: Medicare Other | Admitting: Interventional Cardiology

## 2019-08-11 ENCOUNTER — Encounter: Payer: Self-pay | Admitting: Interventional Cardiology

## 2019-08-11 ENCOUNTER — Other Ambulatory Visit: Payer: Self-pay

## 2019-08-11 VITALS — BP 148/66 | HR 64 | Ht 64.0 in | Wt 107.2 lb

## 2019-08-11 DIAGNOSIS — I5032 Chronic diastolic (congestive) heart failure: Secondary | ICD-10-CM | POA: Diagnosis not present

## 2019-08-11 DIAGNOSIS — Z7901 Long term (current) use of anticoagulants: Secondary | ICD-10-CM

## 2019-08-11 DIAGNOSIS — N184 Chronic kidney disease, stage 4 (severe): Secondary | ICD-10-CM

## 2019-08-11 DIAGNOSIS — Z5181 Encounter for therapeutic drug level monitoring: Secondary | ICD-10-CM

## 2019-08-11 DIAGNOSIS — Z79899 Other long term (current) drug therapy: Secondary | ICD-10-CM

## 2019-08-11 DIAGNOSIS — I480 Paroxysmal atrial fibrillation: Secondary | ICD-10-CM | POA: Diagnosis not present

## 2019-08-11 NOTE — Patient Instructions (Signed)
Medication Instructions:  Your physician recommends that you continue on your current medications as directed. Please refer to the Current Medication list given to you today.  *If you need a refill on your cardiac medications before your next appointment, please call your pharmacy*   Lab Work: None If you have labs (blood work) drawn today and your tests are completely normal, you will receive your results only by: Marland Kitchen MyChart Message (if you have MyChart) OR . A paper copy in the mail If you have any lab test that is abnormal or we need to change your treatment, we will call you to review the results.   Testing/Procedures: None   Follow-Up: At [facility], you and your health needs are our priority.  As part of our continuing mission to provide you with exceptional heart care, we have created designated Provider Care Teams.  These Care Teams include your primary Cardiologist (physician) and Advanced Practice Providers (APPs -  Physician Assistants and Nurse Practitioners) who all work together to provide you with the care you need, when you need it.    Your next appointment:   6 month(s)  The format for your next appointment:   In Person  Provider:   You may see Sinclair Grooms, MD or one of the following Advanced Practice Providers on your designated Care Team:    Truitt Merle, NP  Cecilie Kicks, NP  Kathyrn Drown, NP

## 2019-08-13 ENCOUNTER — Ambulatory Visit: Payer: Medicare Other | Admitting: Interventional Cardiology

## 2019-08-15 DIAGNOSIS — I4891 Unspecified atrial fibrillation: Secondary | ICD-10-CM | POA: Diagnosis not present

## 2019-08-15 DIAGNOSIS — I447 Left bundle-branch block, unspecified: Secondary | ICD-10-CM | POA: Diagnosis not present

## 2019-08-15 DIAGNOSIS — R112 Nausea with vomiting, unspecified: Secondary | ICD-10-CM | POA: Diagnosis not present

## 2019-08-15 DIAGNOSIS — E860 Dehydration: Secondary | ICD-10-CM | POA: Diagnosis not present

## 2019-08-15 DIAGNOSIS — R0902 Hypoxemia: Secondary | ICD-10-CM | POA: Diagnosis not present

## 2019-08-16 ENCOUNTER — Emergency Department (HOSPITAL_COMMUNITY): Payer: Medicare Other

## 2019-08-16 ENCOUNTER — Other Ambulatory Visit: Payer: Self-pay

## 2019-08-16 ENCOUNTER — Inpatient Hospital Stay (HOSPITAL_COMMUNITY): Payer: Medicare Other

## 2019-08-16 ENCOUNTER — Inpatient Hospital Stay (HOSPITAL_COMMUNITY)
Admission: EM | Admit: 2019-08-16 | Discharge: 2019-08-24 | DRG: 388 | Disposition: A | Discharge status: Home Health | Payer: Medicare Other | Attending: Internal Medicine | Admitting: Internal Medicine

## 2019-08-16 ENCOUNTER — Encounter (HOSPITAL_COMMUNITY): Payer: Self-pay | Admitting: Emergency Medicine

## 2019-08-16 DIAGNOSIS — E039 Hypothyroidism, unspecified: Secondary | ICD-10-CM | POA: Diagnosis present

## 2019-08-16 DIAGNOSIS — R627 Adult failure to thrive: Secondary | ICD-10-CM | POA: Diagnosis not present

## 2019-08-16 DIAGNOSIS — I739 Peripheral vascular disease, unspecified: Secondary | ICD-10-CM | POA: Diagnosis present

## 2019-08-16 DIAGNOSIS — I5032 Chronic diastolic (congestive) heart failure: Secondary | ICD-10-CM

## 2019-08-16 DIAGNOSIS — H547 Unspecified visual loss: Secondary | ICD-10-CM | POA: Diagnosis present

## 2019-08-16 DIAGNOSIS — I4819 Other persistent atrial fibrillation: Secondary | ICD-10-CM

## 2019-08-16 DIAGNOSIS — E8809 Other disorders of plasma-protein metabolism, not elsewhere classified: Secondary | ICD-10-CM | POA: Diagnosis present

## 2019-08-16 DIAGNOSIS — E861 Hypovolemia: Secondary | ICD-10-CM | POA: Diagnosis present

## 2019-08-16 DIAGNOSIS — Z931 Gastrostomy status: Secondary | ICD-10-CM | POA: Diagnosis not present

## 2019-08-16 DIAGNOSIS — Z95828 Presence of other vascular implants and grafts: Secondary | ICD-10-CM

## 2019-08-16 DIAGNOSIS — I440 Atrioventricular block, first degree: Secondary | ICD-10-CM | POA: Diagnosis present

## 2019-08-16 DIAGNOSIS — G250 Essential tremor: Secondary | ICD-10-CM | POA: Diagnosis present

## 2019-08-16 DIAGNOSIS — G8929 Other chronic pain: Secondary | ICD-10-CM | POA: Diagnosis present

## 2019-08-16 DIAGNOSIS — I130 Hypertensive heart and chronic kidney disease with heart failure and stage 1 through stage 4 chronic kidney disease, or unspecified chronic kidney disease: Secondary | ICD-10-CM | POA: Diagnosis present

## 2019-08-16 DIAGNOSIS — Z7952 Long term (current) use of systemic steroids: Secondary | ICD-10-CM

## 2019-08-16 DIAGNOSIS — Z86718 Personal history of other venous thrombosis and embolism: Secondary | ICD-10-CM | POA: Diagnosis not present

## 2019-08-16 DIAGNOSIS — I5043 Acute on chronic combined systolic (congestive) and diastolic (congestive) heart failure: Secondary | ICD-10-CM | POA: Diagnosis not present

## 2019-08-16 DIAGNOSIS — Z7401 Bed confinement status: Secondary | ICD-10-CM | POA: Diagnosis not present

## 2019-08-16 DIAGNOSIS — I5033 Acute on chronic diastolic (congestive) heart failure: Secondary | ICD-10-CM | POA: Diagnosis present

## 2019-08-16 DIAGNOSIS — R5381 Other malaise: Secondary | ICD-10-CM | POA: Diagnosis not present

## 2019-08-16 DIAGNOSIS — S2231XA Fracture of one rib, right side, initial encounter for closed fracture: Secondary | ICD-10-CM | POA: Diagnosis not present

## 2019-08-16 DIAGNOSIS — J90 Pleural effusion, not elsewhere classified: Secondary | ICD-10-CM

## 2019-08-16 DIAGNOSIS — F419 Anxiety disorder, unspecified: Secondary | ICD-10-CM | POA: Diagnosis present

## 2019-08-16 DIAGNOSIS — R4182 Altered mental status, unspecified: Secondary | ICD-10-CM | POA: Diagnosis not present

## 2019-08-16 DIAGNOSIS — J690 Pneumonitis due to inhalation of food and vomit: Secondary | ICD-10-CM

## 2019-08-16 DIAGNOSIS — Z79891 Long term (current) use of opiate analgesic: Secondary | ICD-10-CM

## 2019-08-16 DIAGNOSIS — R110 Nausea: Secondary | ICD-10-CM

## 2019-08-16 DIAGNOSIS — Z20822 Contact with and (suspected) exposure to covid-19: Secondary | ICD-10-CM | POA: Diagnosis present

## 2019-08-16 DIAGNOSIS — K5669 Other partial intestinal obstruction: Secondary | ICD-10-CM | POA: Diagnosis not present

## 2019-08-16 DIAGNOSIS — Z881 Allergy status to other antibiotic agents status: Secondary | ICD-10-CM

## 2019-08-16 DIAGNOSIS — R0902 Hypoxemia: Secondary | ICD-10-CM | POA: Diagnosis present

## 2019-08-16 DIAGNOSIS — Z79890 Hormone replacement therapy (postmenopausal): Secondary | ICD-10-CM

## 2019-08-16 DIAGNOSIS — Z86711 Personal history of pulmonary embolism: Secondary | ICD-10-CM

## 2019-08-16 DIAGNOSIS — R0602 Shortness of breath: Secondary | ICD-10-CM

## 2019-08-16 DIAGNOSIS — E43 Unspecified severe protein-calorie malnutrition: Secondary | ICD-10-CM | POA: Diagnosis present

## 2019-08-16 DIAGNOSIS — R400 Somnolence: Secondary | ICD-10-CM | POA: Diagnosis not present

## 2019-08-16 DIAGNOSIS — N1831 Chronic kidney disease, stage 3a: Secondary | ICD-10-CM

## 2019-08-16 DIAGNOSIS — Z66 Do not resuscitate: Secondary | ICD-10-CM | POA: Diagnosis present

## 2019-08-16 DIAGNOSIS — M2550 Pain in unspecified joint: Secondary | ICD-10-CM | POA: Diagnosis not present

## 2019-08-16 DIAGNOSIS — T502X5A Adverse effect of carbonic-anhydrase inhibitors, benzothiadiazides and other diuretics, initial encounter: Secondary | ICD-10-CM | POA: Diagnosis not present

## 2019-08-16 DIAGNOSIS — D649 Anemia, unspecified: Secondary | ICD-10-CM | POA: Diagnosis present

## 2019-08-16 DIAGNOSIS — Z7189 Other specified counseling: Secondary | ICD-10-CM

## 2019-08-16 DIAGNOSIS — E871 Hypo-osmolality and hyponatremia: Secondary | ICD-10-CM | POA: Diagnosis present

## 2019-08-16 DIAGNOSIS — H9190 Unspecified hearing loss, unspecified ear: Secondary | ICD-10-CM | POA: Diagnosis present

## 2019-08-16 DIAGNOSIS — J9811 Atelectasis: Secondary | ICD-10-CM | POA: Diagnosis present

## 2019-08-16 DIAGNOSIS — Z681 Body mass index (BMI) 19 or less, adult: Secondary | ICD-10-CM | POA: Diagnosis not present

## 2019-08-16 DIAGNOSIS — E876 Hypokalemia: Secondary | ICD-10-CM | POA: Diagnosis present

## 2019-08-16 DIAGNOSIS — K56609 Unspecified intestinal obstruction, unspecified as to partial versus complete obstruction: Secondary | ICD-10-CM

## 2019-08-16 DIAGNOSIS — I482 Chronic atrial fibrillation, unspecified: Secondary | ICD-10-CM

## 2019-08-16 DIAGNOSIS — R1110 Vomiting, unspecified: Secondary | ICD-10-CM | POA: Diagnosis not present

## 2019-08-16 DIAGNOSIS — R112 Nausea with vomiting, unspecified: Secondary | ICD-10-CM | POA: Diagnosis not present

## 2019-08-16 DIAGNOSIS — Z96653 Presence of artificial knee joint, bilateral: Secondary | ICD-10-CM | POA: Diagnosis present

## 2019-08-16 DIAGNOSIS — Z90711 Acquired absence of uterus with remaining cervical stump: Secondary | ICD-10-CM

## 2019-08-16 DIAGNOSIS — K5651 Intestinal adhesions [bands], with partial obstruction: Principal | ICD-10-CM | POA: Diagnosis present

## 2019-08-16 DIAGNOSIS — J189 Pneumonia, unspecified organism: Secondary | ICD-10-CM

## 2019-08-16 DIAGNOSIS — Z882 Allergy status to sulfonamides status: Secondary | ICD-10-CM

## 2019-08-16 DIAGNOSIS — Z888 Allergy status to other drugs, medicaments and biological substances status: Secondary | ICD-10-CM

## 2019-08-16 DIAGNOSIS — Z8249 Family history of ischemic heart disease and other diseases of the circulatory system: Secondary | ICD-10-CM

## 2019-08-16 DIAGNOSIS — Z7901 Long term (current) use of anticoagulants: Secondary | ICD-10-CM

## 2019-08-16 DIAGNOSIS — E441 Mild protein-calorie malnutrition: Secondary | ICD-10-CM | POA: Diagnosis present

## 2019-08-16 DIAGNOSIS — I480 Paroxysmal atrial fibrillation: Secondary | ICD-10-CM | POA: Diagnosis not present

## 2019-08-16 DIAGNOSIS — K913 Postprocedural intestinal obstruction, unspecified as to partial versus complete: Secondary | ICD-10-CM

## 2019-08-16 DIAGNOSIS — Z515 Encounter for palliative care: Secondary | ICD-10-CM

## 2019-08-16 DIAGNOSIS — R569 Unspecified convulsions: Secondary | ICD-10-CM | POA: Diagnosis not present

## 2019-08-16 DIAGNOSIS — I509 Heart failure, unspecified: Secondary | ICD-10-CM

## 2019-08-16 DIAGNOSIS — K8689 Other specified diseases of pancreas: Secondary | ICD-10-CM | POA: Diagnosis not present

## 2019-08-16 DIAGNOSIS — R404 Transient alteration of awareness: Secondary | ICD-10-CM | POA: Diagnosis not present

## 2019-08-16 DIAGNOSIS — Z79899 Other long term (current) drug therapy: Secondary | ICD-10-CM

## 2019-08-16 DIAGNOSIS — K8020 Calculus of gallbladder without cholecystitis without obstruction: Secondary | ICD-10-CM | POA: Diagnosis not present

## 2019-08-16 DIAGNOSIS — K56600 Partial intestinal obstruction, unspecified as to cause: Secondary | ICD-10-CM | POA: Diagnosis not present

## 2019-08-16 LAB — CBC WITH DIFFERENTIAL/PLATELET
Abs Immature Granulocytes: 0.11 10*3/uL — ABNORMAL HIGH (ref 0.00–0.07)
Basophils Absolute: 0 10*3/uL (ref 0.0–0.1)
Basophils Relative: 0 %
Eosinophils Absolute: 0 10*3/uL (ref 0.0–0.5)
Eosinophils Relative: 0 %
HCT: 41.4 % (ref 36.0–46.0)
Hemoglobin: 14 g/dL (ref 12.0–15.0)
Immature Granulocytes: 1 %
Lymphocytes Relative: 4 %
Lymphs Abs: 0.6 10*3/uL — ABNORMAL LOW (ref 0.7–4.0)
MCH: 33.3 pg (ref 26.0–34.0)
MCHC: 33.8 g/dL (ref 30.0–36.0)
MCV: 98.3 fL (ref 80.0–100.0)
Monocytes Absolute: 0.8 10*3/uL (ref 0.1–1.0)
Monocytes Relative: 5 %
Neutro Abs: 14 10*3/uL — ABNORMAL HIGH (ref 1.7–7.7)
Neutrophils Relative %: 90 %
Platelets: 307 10*3/uL (ref 150–400)
RBC: 4.21 MIL/uL (ref 3.87–5.11)
RDW: 12.7 % (ref 11.5–15.5)
WBC: 15.6 10*3/uL — ABNORMAL HIGH (ref 4.0–10.5)
nRBC: 0 % (ref 0.0–0.2)

## 2019-08-16 LAB — COMPREHENSIVE METABOLIC PANEL
ALT: 13 U/L (ref 0–44)
AST: 18 U/L (ref 15–41)
Albumin: 3.3 g/dL — ABNORMAL LOW (ref 3.5–5.0)
Alkaline Phosphatase: 82 U/L (ref 38–126)
Anion gap: 5 (ref 5–15)
BUN: 14 mg/dL (ref 8–23)
CO2: 30 mmol/L (ref 22–32)
Calcium: 9.3 mg/dL (ref 8.9–10.3)
Chloride: 90 mmol/L — ABNORMAL LOW (ref 98–111)
Creatinine, Ser: 0.96 mg/dL (ref 0.44–1.00)
GFR calc Af Amer: 57 mL/min — ABNORMAL LOW (ref >60)
GFR calc non Af Amer: 50 mL/min — ABNORMAL LOW (ref >60)
Glucose, Bld: 133 mg/dL — ABNORMAL HIGH (ref 70–99)
Potassium: 4.5 mmol/L (ref 3.5–5.1)
Sodium: 125 mmol/L — ABNORMAL LOW (ref 135–145)
Total Bilirubin: 0.9 mg/dL (ref 0.3–1.2)
Total Protein: 6 g/dL — ABNORMAL LOW (ref 6.5–8.1)

## 2019-08-16 LAB — BASIC METABOLIC PANEL
Anion gap: 11 (ref 5–15)
Anion gap: 10 (ref 5–15)
Anion gap: 10 (ref 5–15)
Anion gap: 10 (ref 5–15)
BUN: 17 mg/dL (ref 8–23)
BUN: 19 mg/dL (ref 8–23)
BUN: 21 mg/dL (ref 8–23)
BUN: 21 mg/dL (ref 8–23)
CO2: 24 mmol/L (ref 22–32)
CO2: 25 mmol/L (ref 22–32)
CO2: 24 mmol/L (ref 22–32)
CO2: 24 mmol/L (ref 22–32)
Calcium: 8.7 mg/dL — ABNORMAL LOW (ref 8.9–10.3)
Calcium: 8.6 mg/dL — ABNORMAL LOW (ref 8.9–10.3)
Calcium: 8.2 mg/dL — ABNORMAL LOW (ref 8.9–10.3)
Calcium: 8.3 mg/dL — ABNORMAL LOW (ref 8.9–10.3)
Chloride: 91 mmol/L — ABNORMAL LOW (ref 98–111)
Chloride: 91 mmol/L — ABNORMAL LOW (ref 98–111)
Chloride: 93 mmol/L — ABNORMAL LOW (ref 98–111)
Chloride: 93 mmol/L — ABNORMAL LOW (ref 98–111)
Creatinine, Ser: 0.98 mg/dL (ref 0.44–1.00)
Creatinine, Ser: 1.1 mg/dL — ABNORMAL HIGH (ref 0.44–1.00)
Creatinine, Ser: 1.25 mg/dL — ABNORMAL HIGH (ref 0.44–1.00)
Creatinine, Ser: 1.23 mg/dL — ABNORMAL HIGH (ref 0.44–1.00)
GFR calc Af Amer: 56 mL/min — ABNORMAL LOW (ref >60)
GFR calc Af Amer: 49 mL/min — ABNORMAL LOW (ref >60)
GFR calc Af Amer: 42 mL/min — ABNORMAL LOW (ref >60)
GFR calc Af Amer: 43 mL/min — ABNORMAL LOW (ref >60)
GFR calc non Af Amer: 48 mL/min — ABNORMAL LOW (ref >60)
GFR calc non Af Amer: 42 mL/min — ABNORMAL LOW (ref >60)
GFR calc non Af Amer: 36 mL/min — ABNORMAL LOW (ref >60)
GFR calc non Af Amer: 37 mL/min — ABNORMAL LOW (ref >60)
Glucose, Bld: 120 mg/dL — ABNORMAL HIGH (ref 70–99)
Glucose, Bld: 120 mg/dL — ABNORMAL HIGH (ref 70–99)
Glucose, Bld: 115 mg/dL — ABNORMAL HIGH (ref 70–99)
Glucose, Bld: 116 mg/dL — ABNORMAL HIGH (ref 70–99)
Potassium: 4.6 mmol/L (ref 3.5–5.1)
Potassium: 4.8 mmol/L (ref 3.5–5.1)
Potassium: 5.1 mmol/L (ref 3.5–5.1)
Potassium: 4.7 mmol/L (ref 3.5–5.1)
Sodium: 126 mmol/L — ABNORMAL LOW (ref 135–145)
Sodium: 126 mmol/L — ABNORMAL LOW (ref 135–145)
Sodium: 127 mmol/L — ABNORMAL LOW (ref 135–145)
Sodium: 127 mmol/L — ABNORMAL LOW (ref 135–145)

## 2019-08-16 LAB — LIPASE, BLOOD: Lipase: 23 U/L (ref 11–51)

## 2019-08-16 LAB — SARS CORONAVIRUS 2 BY RT PCR (HOSPITAL ORDER, PERFORMED IN ~~LOC~~ HOSPITAL LAB): SARS Coronavirus 2: NEGATIVE

## 2019-08-16 LAB — PROTIME-INR
INR: 1.1 (ref 0.8–1.2)
Prothrombin Time: 13.3 seconds (ref 11.4–15.2)

## 2019-08-16 LAB — C-REACTIVE PROTEIN: CRP: 4.3 mg/dL — ABNORMAL HIGH (ref <1.0)

## 2019-08-16 LAB — HIV ANTIBODY (ROUTINE TESTING W REFLEX): HIV Screen 4th Generation wRfx: NONREACTIVE

## 2019-08-16 MED ORDER — ONDANSETRON HCL 4 MG/2ML IJ SOLN
4.0000 mg | Freq: Four times a day (QID) | INTRAMUSCULAR | Status: DC | PRN
  Administered 2019-08-18: 4 mg via INTRAVENOUS
  Filled 2019-08-16: qty 2

## 2019-08-16 MED ORDER — PREDNISONE 5 MG PO TABS
5.0000 mg | ORAL_TABLET | Freq: Every day | ORAL | Status: DC
  Administered 2019-08-17 – 2019-08-24 (×7): 5 mg via ORAL
  Filled 2019-08-16 (×8): qty 1

## 2019-08-16 MED ORDER — ACETAMINOPHEN 650 MG RE SUPP
650.0000 mg | Freq: Four times a day (QID) | RECTAL | Status: DC | PRN
  Administered 2019-08-18 – 2019-08-23 (×3): 650 mg via RECTAL
  Filled 2019-08-16 (×3): qty 1

## 2019-08-16 MED ORDER — AMIODARONE HCL 200 MG PO TABS
200.0000 mg | ORAL_TABLET | Freq: Every day | ORAL | Status: DC
  Administered 2019-08-16 – 2019-08-18 (×3): 200 mg via ORAL
  Filled 2019-08-16 (×3): qty 1

## 2019-08-16 MED ORDER — ALBUMIN HUMAN 5 % IV SOLN
25.0000 g | Freq: Once | INTRAVENOUS | Status: AC
  Administered 2019-08-16: 12.5 g via INTRAVENOUS
  Filled 2019-08-16: qty 500

## 2019-08-16 MED ORDER — GABAPENTIN 100 MG PO CAPS: 100.0000 mg | ORAL_CAPSULE | ORAL | Status: DC

## 2019-08-16 MED ORDER — ONDANSETRON HCL 4 MG PO TABS
4.0000 mg | ORAL_TABLET | Freq: Four times a day (QID) | ORAL | Status: DC | PRN
  Administered 2019-08-17: 4 mg via ORAL
  Filled 2019-08-16: qty 1

## 2019-08-16 MED ORDER — PROCHLORPERAZINE EDISYLATE 10 MG/2ML IJ SOLN
5.0000 mg | Freq: Four times a day (QID) | INTRAMUSCULAR | Status: DC | PRN
  Administered 2019-08-16 – 2019-08-17 (×2): 5 mg via INTRAVENOUS
  Filled 2019-08-16 (×2): qty 2

## 2019-08-16 MED ORDER — ACETAMINOPHEN 325 MG PO TABS
650.0000 mg | ORAL_TABLET | Freq: Four times a day (QID) | ORAL | Status: DC | PRN
  Administered 2019-08-16 – 2019-08-24 (×3): 650 mg via ORAL
  Filled 2019-08-16 (×3): qty 2

## 2019-08-16 MED ORDER — APIXABAN 2.5 MG PO TABS
2.5000 mg | ORAL_TABLET | Freq: Two times a day (BID) | ORAL | Status: DC
  Administered 2019-08-16 – 2019-08-19 (×6): 2.5 mg via ORAL
  Filled 2019-08-16 (×8): qty 1

## 2019-08-16 MED ORDER — LUBIPROSTONE 8 MCG PO CAPS
8.0000 ug | ORAL_CAPSULE | Freq: Three times a day (TID) | ORAL | Status: DC
  Administered 2019-08-16 – 2019-08-24 (×18): 8 ug via ORAL
  Filled 2019-08-16 (×26): qty 1

## 2019-08-16 MED ORDER — SODIUM CHLORIDE 0.9 % IV SOLN: INTRAVENOUS | Status: DC

## 2019-08-16 MED ORDER — GABAPENTIN 100 MG PO CAPS
100.0000 mg | ORAL_CAPSULE | Freq: Two times a day (BID) | ORAL | Status: DC
  Administered 2019-08-17 – 2019-08-19 (×5): 100 mg via ORAL
  Filled 2019-08-16 (×6): qty 1

## 2019-08-16 MED ORDER — LEVOTHYROXINE SODIUM 100 MCG/5ML IV SOLN: 75.0000 ug | Freq: Every day | INTRAVENOUS | Status: DC

## 2019-08-16 MED ORDER — LEVOTHYROXINE SODIUM 75 MCG PO TABS
150.0000 ug | ORAL_TABLET | Freq: Every day | ORAL | Status: DC
  Administered 2019-08-17 – 2019-08-24 (×7): 150 ug via ORAL
  Filled 2019-08-16 (×7): qty 2

## 2019-08-16 MED ORDER — DRONABINOL 2.5 MG PO CAPS
2.5000 mg | ORAL_CAPSULE | Freq: Two times a day (BID) | ORAL | Status: DC
  Administered 2019-08-16: 2.5 mg via ORAL
  Filled 2019-08-16 (×2): qty 1

## 2019-08-16 MED ORDER — SODIUM CHLORIDE 0.9 % IV BOLUS
500.0000 mL | Freq: Once | INTRAVENOUS | Status: AC
  Administered 2019-08-16: 500 mL via INTRAVENOUS

## 2019-08-16 MED ORDER — NITROGLYCERIN 0.4 MG SL SUBL: 0.4000 mg | SUBLINGUAL_TABLET | SUBLINGUAL | Status: DC | PRN

## 2019-08-16 MED ORDER — MORPHINE SULFATE (PF) 2 MG/ML IV SOLN: 1.0000 mg | INTRAVENOUS | Status: DC | PRN

## 2019-08-16 MED ORDER — IPRATROPIUM-ALBUTEROL 0.5-2.5 (3) MG/3ML IN SOLN
3.0000 mL | RESPIRATORY_TRACT | Status: DC | PRN
  Administered 2019-08-18: 3 mL via RESPIRATORY_TRACT
  Filled 2019-08-16 (×2): qty 3

## 2019-08-16 MED ORDER — GABAPENTIN 100 MG PO CAPS
200.0000 mg | ORAL_CAPSULE | Freq: Every day | ORAL | Status: DC
  Administered 2019-08-16 – 2019-08-19 (×3): 200 mg via ORAL
  Filled 2019-08-16 (×3): qty 2

## 2019-08-16 MED ORDER — ONDANSETRON HCL 4 MG/2ML IJ SOLN
4.0000 mg | Freq: Once | INTRAMUSCULAR | Status: AC
  Administered 2019-08-16: 4 mg via INTRAVENOUS
  Filled 2019-08-16: qty 2

## 2019-08-16 MED ORDER — SODIUM CHLORIDE 0.9 % IV SOLN
3.0000 g | Freq: Two times a day (BID) | INTRAVENOUS | Status: DC
  Administered 2019-08-16 – 2019-08-23 (×16): 3 g via INTRAVENOUS
  Filled 2019-08-16: qty 8
  Filled 2019-08-16 (×2): qty 3
  Filled 2019-08-16 (×2): qty 8
  Filled 2019-08-16: qty 3
  Filled 2019-08-16 (×8): qty 8
  Filled 2019-08-16: qty 3
  Filled 2019-08-16 (×2): qty 8
  Filled 2019-08-16: qty 3
  Filled 2019-08-16 (×2): qty 8

## 2019-08-16 NOTE — ED Provider Notes (Signed)
[facility] EMERGENCY DEPARTMENT Provider Note   CSN: [id number] Arrival date & time: 08/16/19  0014     History Chief Complaint  Patient presents with  . Emesis  . Nausea    [name] is a 86 y.o. female.  Patient is a 86 year old female with past medical history of small bowel obstruction related to adhesions, prior PE, paroxysmal A. fib, chronic renal insufficiency, and blindness.  She presents today for evaluation of nausea and vomiting.  This started earlier this evening.  Patient describes some abdominal bloating, but denies pain.  She denies fevers or chills.  She denies any bloody stool or vomit.  The history is provided by the patient.  Emesis Severity:  Moderate Duration:  3 hours Timing:  Constant Quality:  Stomach contents Progression:  Worsening Chronicity:  New Recent urination:  Normal Relieved by:  Nothing Worsened by:  Nothing      Past Medical History:  Diagnosis Date  . Blindness   . Blood clot associated with vein wall inflammation   . CKD (chronic kidney disease), stage III   . Colitis   . Goiter    s/p radioactive iod. tx  . Hypothyroidism   . On amiodarone therapy   . Paroxysmal atrial fibrillation (HCC)   . Pulmonary embolus (HCC)    and DVT s/p IVC filter  . SBO (small bowel obstruction) ([place])   . Spinal stenosis of lumbar region with radiculopathy    possible neurogenic claudication recently?  . Temporal arteritis [facility])     Patient Active Problem List   Diagnosis Date Noted  . Atrial fibrillation, chronic ([place]) 03/15/2019  . Small bowel obstruction ([place]) 03/14/2019  . UTI (urinary tract infection) 09/13/2018  . Persistent atrial fibrillation ([place]) 09/12/2018  . Hypotension   . Anticoagulated on warfarin 11/23/2016  . Encounter for monitoring amiodarone therapy 07/07/2016  . Mild anemia 11/28/2015  . Chronic diastolic heart failure ([place]) 12/29/2013  . SBO (small bowel obstruction) ([place]) 01/17/2013  .  Microscopic colitis 01/17/2013  . Leukocytosis 01/17/2013  . Aspiration pneumonia ([place]) 01/17/2013  . CKD (chronic kidney disease), stage IV ([place])   . SYNCOPE 09/21/2008  . Disorder of thyroid 09/17/2008  . Atrial fibrillation with RVR ([place]) 09/17/2008  . TEMPORAL ARTERITIS 09/17/2008  . Arthropathy 09/17/2008  . DYSPNEA ON EXERTION 09/17/2008    Past Surgical History:  Procedure Laterality Date  . APPENDECTOMY  1940  . BACK SURGERY  04/1998   spinal stenosis  . BACK SURGERY  2000   Ruptured Disc  . BREAST BIOPSY     cysts in both breasts  . CARPAL TUNNEL RELEASE  2002  . ivc filter    . PARTIAL HYSTERECTOMY    . TONSILLECTOMY  1934  . TOTAL KNEE ARTHROPLASTY Left 04/1999  . TOTAL KNEE ARTHROPLASTY Right 02/2000     OB History   No obstetric history on file.     Family History  Problem Relation Age of Onset  . Breast cancer Sister   . Congestive Heart Failure Mother   . Stroke Father   . Crohn's disease Neg Hx   . Ulcerative colitis Neg Hx     Social History   Tobacco Use  . Smoking status: Never Smoker  . Smokeless tobacco: Never Used  Vaping Use  . Vaping Use: Never assessed  Substance Use Topics  . Alcohol use: No  . Drug use: No    Home Medications Prior to Admission medications   Medication Sig  Start Date End Date Taking? Authorizing Provider  acetaminophen (TYLENOL) 500 MG tablet Take 500-1,000 mg by mouth See admin instructions. Take 500mg  in the morning, 500mg  midday and 1000mg  at bedtime.    [provider]  amiodarone (PACERONE) 200 MG tablet Take 1 tablet (200 mg total) by mouth daily. 09/11/18   Belva Crome, MD  ASPERCREME LIDOCAINE EX Apply 1 application topically as needed (muscle pain).    [provider]  Calcium Carbonate-Vitamin D (CALCIUM HIGH POTENCY/VITAMIN D) 600-200 MG-UNIT TABS Take 1 tablet by mouth in the morning and at bedtime.     [provider]  CRANBERRY EXTRACT PO Take 2 tablets by mouth 2 (two)  times daily.     [provider]  dronabinol (MARINOL) 2.5 MG capsule Take 2.5 mg by mouth at bedtime. 07/27/19   [provider]  ELIQUIS 2.5 MG TABS tablet TAKE 1 TABLET BY MOUTH TWICE DAILY. 04/22/19   Belva Crome, MD  gabapentin (NEURONTIN) 100 MG capsule Take 100 mg by mouth 4 (four) times daily.     [provider]  levothyroxine (SYNTHROID, LEVOTHROID) 150 MCG tablet Take 150 mcg by mouth daily before breakfast.    [provider]  lubiprostone (AMITIZA) 8 MCG capsule Take 8 mcg by mouth 3 (three) times daily with meals.     [provider]  Misc Natural Products (GLUCOSAMINE CHONDROITIN COMPLX) TABS Take 1 tablet by mouth 2 (two) times daily.     [provider]  Multiple Vitamins-Minerals (OCUVITE ADULT 50+ PO) Take 1 tablet by mouth daily.    [provider]  nitrofurantoin (MACRODANTIN) 50 MG capsule Take 50 mg by mouth daily.    [provider]  nitroGLYCERIN (NITROSTAT) 0.4 MG SL tablet Place 1 tablet (0.4 mg total) under the tongue every 5 (five) minutes as needed for chest pain. 09/07/18 03/14/19  Belva Crome, MD  predniSONE (DELTASONE) 5 MG tablet Take 5 mg by mouth daily.     [provider]  Probiotic Product ([place]) Take 1 tablet by mouth daily.     [provider]  traMADol (ULTRAM) 50 MG tablet Take 50 mg by mouth daily as needed for moderate pain.     [provider]    Allergies    Hydrocortisone, Sulfa antibiotics, Cefdinir, Cortisone, Prednisone, Sulfasalazine, and Sulfonamide derivatives  Review of Systems   Review of Systems  Gastrointestinal: Positive for vomiting.  All other systems reviewed and are negative.   Physical Exam Updated Vital Signs BP (!) 142/67 (BP Location: Left Arm)   Pulse 68   Temp 98.1 F (36.7 C) (Oral)   Resp 16   SpO2 98%   Physical Exam Vitals and nursing note reviewed.  Constitutional:      General: She is  not in acute distress.    Appearance: She is well-developed. She is not diaphoretic.  HENT:     Head: Normocephalic and atraumatic.  Cardiovascular:     Rate and Rhythm: Normal rate and regular rhythm.     Heart sounds: No murmur heard.  No friction rub. No gallop.   Pulmonary:     Effort: Pulmonary effort is normal. No respiratory distress.     Breath sounds: Normal breath sounds. No wheezing.  Abdominal:     General: Bowel sounds are normal. There is no distension.     Palpations: Abdomen is soft.     Tenderness: There is abdominal tenderness. There is no guarding or  rebound.     Comments: There is mild generalized tenderness.  There is some tympany to percussion.  Musculoskeletal:        General: Normal range of motion.     Cervical back: Normal range of motion and neck supple.  Skin:    General: Skin is warm and dry.  Neurological:     Mental Status: She is alert and oriented to person, place, and time.     ED Results / Procedures / Treatments   Labs (all labs ordered are listed, but only abnormal results are displayed) Labs Reviewed  COMPREHENSIVE METABOLIC PANEL  LIPASE, BLOOD  CBC WITH DIFFERENTIAL/PLATELET    EKG None  Radiology No results found.  Procedures Procedures (including critical care time)  Medications Ordered in ED Medications  sodium chloride 0.9 % bolus 500 mL (has no administration in time range)  ondansetron (ZOFRAN) injection 4 mg (has no administration in time range)    ED Course  I have reviewed the triage vital signs and the nursing notes.  Pertinent labs & imaging results that were available during my care of the patient were reviewed by me and considered in my medical decision making (see chart for details).    MDM Rules/Calculators/A&P  Patient is a 86 year old female presenting with complaints of nausea and vomiting.  Patient has history of small bowel obstruction related to adhesions.  She had an admission the end of  February/early March for the same.    Her work-up today shows a white count of 15.6 along with CT consistent with small bowel obstruction.  Patient has been given Zofran and IV fluids.  She will be admitted to the hospitalist service for further treatment.  Patient's small bowel obstruction resolved without an NG tube during her last admission.  Final Clinical Impression(s) / ED Diagnoses Final diagnoses:  None    Rx / DC Orders ED Discharge Orders    None       Veryl Speak, MD 08/16/19 [phone]

## 2019-08-16 NOTE — H&P (Signed)
History and Physical    [name] GGY:[id number] DOB: 1920/06/13 DOA: 08/16/2019  PCP: Antony Contras, MD  Patient coming from: Home   Chief Complaint:  Chief Complaint  Patient presents with  . Emesis  . Nausea     HPI:    86 year old female with past medical history of chronic kidney disease stage III, chronic atrial fibrillation, prior DVT status post IVC filter placement, diastolic congestive heart failure (Echo 08/2018 EF 55-60%), hypothyroidism and multiple small bowel obstructions in the past (last in 03/2019 and 11/2016) who presents to [facility] emergency department with complaints of abdominal pain nausea and vomiting.  Patient is a somewhat poor historian and the majority the history is obtained from the daughter at the bedside.  According to the daughter, the patient has been experiencing intermittent nausea for the past several weeks.  This nausea became particularly severe during the early afternoon resulting in the patient being unable to tolerate oral intake.  At approximately 8 PM the patient began to exhibit frequent bouts of nonbloody nonbilious vomitus, currently 5 or 6 times.  Additionally, patient is complaining of lower abdominal pain, dull in quality, radiating around the abdomen in a bandlike distribution, moderate to severe in intensity.  Patient was eventually brought into [facility] via EMS for evaluation.  In the emergency department, CT imaging of the abdomen revealed evidence of small bowel obstruction with distal transition zone likely secondary to adhesions.  Additionally, there is evidence of bronchiectasis with interstitial fibrosis with areas of patchy airspace infiltrate possibly secondary to pneumonia or edema with a small left pleural effusion.  Patient was provided with 500 cc of intravenous fluids and intravenous Zofran and the hospitalist group was then called to assess the patient for admission the hospital.   Review of Systems: A  10-system review of systems has been performed and all systems are negative with the exception of what is listed in the HPI,   Past Medical History:  Diagnosis Date  . Blindness   . Blood clot associated with vein wall inflammation   . CKD (chronic kidney disease), stage III   . Colitis   . Goiter    s/p radioactive iod. tx  . Hypothyroidism   . On amiodarone therapy   . Paroxysmal atrial fibrillation (HCC)   . Pulmonary embolus (HCC)    and DVT s/p IVC filter  . SBO (small bowel obstruction) ([place])   . Spinal stenosis of lumbar region with radiculopathy    possible neurogenic claudication recently?  . Temporal arteritis [facility])     Past Surgical History:  Procedure Laterality Date  . APPENDECTOMY  1940  . BACK SURGERY  04/1998   spinal stenosis  . BACK SURGERY  2000   Ruptured Disc  . BREAST BIOPSY     cysts in both breasts  . CARPAL TUNNEL RELEASE  2002  . ivc filter    . PARTIAL HYSTERECTOMY    . TONSILLECTOMY  1934  . TOTAL KNEE ARTHROPLASTY Left 04/1999  . TOTAL KNEE ARTHROPLASTY Right 02/2000     reports that she has never smoked. She has never used smokeless tobacco. She reports that she does not drink alcohol and does not use drugs.  Allergies  Allergen Reactions  . Hydrocortisone Other (See Comments)  . Sulfa Antibiotics Nausea Only  . Cefdinir Other (See Comments)    Caused bad diarrhea Caused bad diarrhea Caused bad diarrhea Caused bad diarrhea Caused bad diarrhea  . Cortisone Other (See Comments)  Flushed hands and face  Flushed hands and face Flushed hands and face  Flushed hands and face Flushed hands and face  . Prednisone Other (See Comments)    cuased blood clots Can have very small dose cuased blood clots cuased blood clots Can have very small dose cuased blood clots cuased blood clots  . Sulfasalazine Other (See Comments)    Nausea side effects Stomach pains and cramping Nausea side effects Stomach pains and cramping Stomach pains  and cramping  . Sulfonamide Derivatives     Stomach pains and cramping     Family History  Problem Relation Age of Onset  . Breast cancer Sister   . Congestive Heart Failure Mother   . Stroke Father   . Crohn's disease Neg Hx   . Ulcerative colitis Neg Hx      Prior to Admission medications   Medication Sig Start Date End Date Taking? Authorizing Provider  acetaminophen (TYLENOL) 500 MG tablet Take 500-1,000 mg by mouth See admin instructions. Take 500mg  in the morning, 500mg  midday and 1000mg  at bedtime.   Yes [provider]  amiodarone (PACERONE) 200 MG tablet Take 1 tablet (200 mg total) by mouth daily. 09/11/18  Yes Belva Crome, MD  ASPERCREME LIDOCAINE EX Apply 1 application topically at bedtime. Left back rib   Yes [provider]  Calcium Carbonate-Vitamin D (CALCIUM HIGH POTENCY/VITAMIN D) 600-200 MG-UNIT TABS Take 1 tablet by mouth daily.    Yes [provider]  CRANBERRY EXTRACT PO Take 2 tablets by mouth 2 (two) times daily.    Yes [provider]  dronabinol (MARINOL) 2.5 MG capsule Take 2.5 mg by mouth 2 (two) times daily.  07/27/19  Yes [provider]  ELIQUIS 2.5 MG TABS tablet TAKE 1 TABLET BY MOUTH TWICE DAILY. 04/22/19  Yes Belva Crome, MD  gabapentin (NEURONTIN) 100 MG capsule Take 100-200 mg by mouth See admin instructions. Take 1 capsule by mouth in the morning, 1 capsule at 3:30p and 2 capsules at bedtime   Yes [provider]  levothyroxine (SYNTHROID, LEVOTHROID) 150 MCG tablet Take 150 mcg by mouth daily before breakfast. BRAND NAME ONLY (Synthroid)   Yes [provider]  lubiprostone (AMITIZA) 8 MCG capsule Take 8 mcg by mouth 3 (three) times daily with meals.    Yes [provider]  Misc Natural Products (GLUCOSAMINE CHONDROITIN COMPLX) TABS Take 1 tablet by mouth 2 (two) times daily.    Yes [provider]  Multiple Vitamins-Minerals (OCUVITE ADULT 50+ PO) Take 1 tablet by  mouth daily.   Yes [provider]  nitrofurantoin (MACRODANTIN) 50 MG capsule Take 50 mg by mouth daily at 12 noon.    Yes [provider]  nitroGLYCERIN (NITROSTAT) 0.4 MG SL tablet Place 1 tablet (0.4 mg total) under the tongue every 5 (five) minutes as needed for chest pain. 09/07/18 08/16/19 Yes Belva Crome, MD  predniSONE (DELTASONE) 5 MG tablet Take 5 mg by mouth daily.    Yes [provider]  Probiotic Product ([place]) Take 1 tablet by mouth daily.    Yes [provider]  traMADol (ULTRAM) 50 MG tablet Take 50 mg by mouth at bedtime.    Yes [provider]    Physical Exam: Vitals:   08/16/19 0022 08/16/19 0118 08/16/19 0130 08/16/19 0230  BP: (!) 142/67 (!) 146/64 (!) 133/61 (!) 163/65  Pulse: 68 65 65 64  Resp: 16  23 20   Temp:  98.1 F (36.7 C)     TempSrc: Oral     SpO2: 98% 99% 97% 95%    Constitutional: Acute alert and oriented x3, patient is in distress due to abdominal pain nausea and vomiting. Skin: no rashes, no lesions, poor skin turgor noted Eyes: Pupils are equally reactive to light.  No evidence of scleral icterus or conjunctival pallor.  ENMT: Dry mucous membranes noted.  Posterior pharynx clear of any exudate or lesions.   Neck: normal, supple, no masses, no thyromegaly.  No evidence of jugular venous distension.   Respiratory: Significant bibasilar rales and rhonchi with intermittent expiratory wheezing.  Normal respiratory effort. No accessory muscle use.  Cardiovascular: Regular rate and rhythm, no murmurs / rubs / gallops. No extremity edema. 2+ pedal pulses. No carotid bruits.  Chest:   Nontender without crepitus or deformity.   Back:   Nontender without crepitus or deformity. Abdomen: Very mild generalized abdominal tenderness.  Abdomen is protuberant but soft.  Notable bowel sounds in all quadrants.   No evidence of intra-abdominal masses.  Musculoskeletal: No joint deformity upper and lower  extremities. Good ROM, no contractures. Normal muscle tone.  Neurologic: Notable gross hearing loss.  CN 2-12 grossly intact. Sensation intact, patient is moving all 4 extremities spontaneously.  Patient is following all commands.  Patient is responsive to verbal stimuli.   Psychiatric: Patient presents as a normal mood with flat affect.  Patient seems to possess insight as to theircurrent situation.     Labs on Admission: I have personally reviewed following labs and imaging studies -   CBC: Recent Labs  Lab 08/16/19 0117  WBC 15.6*  NEUTROABS 14.0*  HGB 14.0  HCT 41.4  MCV 98.3  PLT 509   Basic Metabolic Panel: Recent Labs  Lab 08/16/19 0117  NA 125*  K 4.5  CL 90*  CO2 30  GLUCOSE 133*  BUN 14  CREATININE 0.96  CALCIUM 9.3   GFR: Estimated Creatinine Clearance: 25.7 mL/min (by C-G formula based on SCr of 0.96 mg/dL). Liver Function Tests: Recent Labs  Lab 08/16/19 0117  AST 18  ALT 13  ALKPHOS 82  BILITOT 0.9  PROT 6.0*  ALBUMIN 3.3*   Recent Labs  Lab 08/16/19 0117  LIPASE 23   No results for input(s): AMMONIA in the last 168 hours. Coagulation Profile: No results for input(s): INR, PROTIME in the last 168 hours. Cardiac Enzymes: No results for input(s): CKTOTAL, CKMB, CKMBINDEX, TROPONINI in the last 168 hours. BNP (last 3 results) No results for input(s): PROBNP in the last 8760 hours. HbA1C: No results for input(s): HGBA1C in the last 72 hours. CBG: No results for input(s): GLUCAP in the last 168 hours. Lipid Profile: No results for input(s): CHOL, HDL, LDLCALC, TRIG, CHOLHDL, LDLDIRECT in the last 72 hours. Thyroid Function Tests: No results for input(s): TSH, T4TOTAL, FREET4, T3FREE, THYROIDAB in the last 72 hours. Anemia Panel: No results for input(s): VITAMINB12, FOLATE, FERRITIN, TIBC, IRON, RETICCTPCT in the last 72 hours. Urine analysis:    Component Value Date/Time   COLORURINE YELLOW 03/14/2019 1549   APPEARANCEUR CLEAR 03/14/2019  1549   LABSPEC 1.014 03/14/2019 1549   PHURINE 7.0 03/14/2019 1549   GLUCOSEU NEGATIVE 03/14/2019 1549   HGBUR NEGATIVE 03/14/2019 1549   BILIRUBINUR NEGATIVE 03/14/2019 1549   KETONESUR NEGATIVE 03/14/2019 1549   PROTEINUR NEGATIVE 03/14/2019 1549   UROBILINOGEN 0.2 01/17/2013 0730   NITRITE POSITIVE (A) 03/14/2019 1549   LEUKOCYTESUR SMALL (A) 03/14/2019 1549  Radiological Exams on Admission - Personally Reviewed: CT ABDOMEN PELVIS WO CONTRAST  Result Date: 08/16/2019 CLINICAL DATA:  Suspected bowel obstruction. Nausea and vomiting with dark emesis. Previous history of small bowel obstruction. EXAM: CT ABDOMEN AND PELVIS WITHOUT CONTRAST TECHNIQUE: Multidetector CT imaging of the abdomen and pelvis was performed following the standard protocol without IV contrast. COMPARISON:  07/12/2019 FINDINGS: Lower chest: Bronchiectasis and interstitial fibrosis in the lung bases. Mild patchy airspace infiltration may represent inflammatory change, pneumonia, or edema. Small left pleural effusion. Small pericardial effusion. Cardiac enlargement with coronary artery calcification. Tortuous and dilated descending aorta with calcification. Hepatobiliary: The hepatic parenchyma appears diffusely dense, possibly artifact or possibly iron overload. No focal lesions. Increased density of the bile may represent vicarious excretion or milk of calcium. Noncalcified gallstone. No bile duct dilatation. Pancreas: Fatty atrophy of the pancreas. Spleen: Normal in size without focal abnormality. Adrenals/Urinary Tract: No adrenal gland nodules. Kidneys are symmetrical. No hydronephrosis or hydroureter. No renal or ureteral stones. Bladder is decompressed. Stomach/Bowel: Dilated gas and fluid-filled small bowel with air-fluid levels. The terminal ileum is decompressed. Stool-filled colon is nondistended. Changes consistent with small bowel obstruction. Distal transition zone, likely adhesions. Vascular/Lymphatic: Diffuse  aortic calcification. Low IVC filter. Dilated aorta at the thoracic inlet measuring about 4.2 cm diameter. No significant lymphadenopathy. Reproductive: Uterus and bilateral adnexa are unremarkable. Other: No abdominal wall hernia or abnormality. No abdominopelvic ascites. Musculoskeletal: Lumbar scoliosis convex towards the left. Degenerative changes in the lumbar spine and hips. Geographic sclerosis and lucency in the superior right femoral head may indicate avascular necrosis. IMPRESSION: 1. Small bowel obstruction with distal transition zone, likely adhesions. 2. Cardiac enlargement with coronary artery calcification. Small pericardial effusion. 3. Bronchiectasis and interstitial fibrosis in the lung bases. Mild patchy airspace infiltration may represent inflammatory change, pneumonia, or edema. Small left pleural effusion. 4. Dilated aorta at the thoracic inlet measuring about 4.2 cm diameter. 5. Low IVC filter. 6. Geographic sclerosis and lucency in the superior right femoral head may indicate avascular necrosis. 7. Noncalcified gallstone. 8. Increased density of the hepatic parenchyma may represent artifact or iron overload. Aortic Atherosclerosis (ICD10-I70.0). Electronically Signed   By: Lucienne Capers M.D.   On: 08/16/2019 00:55    Telemetry: Personally reviewed.  Rhythm is normal sinus rhythm with heart rate of 66.  No dynamic ST segment changes appreciated.  Assessment/Plan Principal Problem:   Small bowel obstruction due to postoperative adhesions   Patient suffering a recurrent small bowel obstruction, likely secondary to adhesions  Patient is a longstanding known history of recurrent small bowel obstructions with previous hospitalizations in March 2021 and November 2018.  During each of these hospitalizations no surgical intervention was pursued.  Patient has good bowel sounds and even had a bowel movement at approximately 11 PM yesterday evening which bodes well for the patient's  chances of resolving the small bowel obstruction conservatively.  I have offered obtaining a surgical consultation to evaluate surgical options with the patient but she is adamantly refusing any surgical intervention at this time.  Patient was abdominal exam is benign and patient is not actively vomiting.  If patient begins recurrent vomiting then will pursue NG tube placement to low intermittent suction.  Provided patient with intravenous antiemetics  Hydrating patient with intravenous isotonic fluids  Serial abdominal imaging to document resolution of obstruction.  If patient clinically worsens will revisit possible surgical consultation with the patient  Active Problems: Pneumonia of the bilateral lower lobes secondary to gastric secretions  Patchy bilateral lower lobe infiltrates on CT imaging  While typically aspiration pneumonia does not exhibit this appearance, considering substantial leukocytosis we will temporarily place patient on intravenous antibiotic therapy for now.  Patient has suffered from concurrent aspiration pneumonia in the setting of bowel obstructions in the past.  We will provide patient with intravenous Unasyn considering patient's cephalosporin allergy  We will provide patient with as needed bronchodilator therapy  Supplemental oxygen for bouts of hypoxia  Blood cultures have been ordered    Hyponatremia   Patient is exhibiting a moderate hyponatremia, likely secondary to poor oral intake and recent vomiting  Hydrating patient gently with intravenous isotonic fluids  Performing serial chemistries  If sodium levels do not correct as expected will expand hyponatremia work-up.    Chronic diastolic heart failure (HCC)   No evidence of cardiogenic volume overload at this time.  Will monitor for evidence of volume overload as we hydrate this patient with intravenous fluids.    Chronic kidney disease, stage 3a   Strict input and output  monitoring  Monitoring renal function with serial chemistries    Atrial fibrillation, chronic (HCC)  Temporarily holding home regimen of Eliquis both in case of surgical intervention as well as in preparation for possible NG tube placement to avoid bleeding complications.  Patient is typically on amiodarone 200 mg daily which is being held while patient is n.p.o. and will be started as soon as patient is able to tolerate.  Monitoring patient on telemetry   Code Status:  DNR Family Communication: Daughter is at the bedside and has been updated on plan of care  Status is: Inpatient  Remains inpatient appropriate because:Ongoing diagnostic testing needed not appropriate for outpatient work up, IV treatments appropriate due to intensity of illness or inability to take PO and Inpatient level of care appropriate due to severity of illness   Dispo: The patient is from: Home              Anticipated d/c is to: Home              Anticipated d/c date is: > 3 days              Patient currently is not medically stable to d/c.        Vernelle Emerald MD [facility] Pager [phone]  If 7PM-7AM, please contact night-coverage [email or web address] Use universal [place] password for that web site. If you do not have the password, please call the hospital operator.  08/16/2019, 3:12 AM

## 2019-08-16 NOTE — ED Notes (Signed)
Patient complaining of nausea.  Patient to be medicated per admitting orders.

## 2019-08-16 NOTE — ED Triage Notes (Addendum)
Patient with nausea and vomiting for the last two hours.  Emesis is dark in nature.  Patient has had a history of SBO in the past, AFib.  VSS.  DNR at bedside.  Patient is from home and blind.  4mg  Zofran given which has helped the nausea and vomiting.

## 2019-08-16 NOTE — Progress Notes (Signed)
PROGRESS NOTE    Patient: [name]                            PCP: Antony Contras, MD                    DOB: [birth year]            DOA: 08/16/2019 EGB:[id number]             DOS: 08/16/2019, 2:05 PM   LOS: 0 days   Date of Service: The patient was seen and examined on 08/16/2019  Subjective:   The patient was seen and examined this Am. complaining leg pain  Improved N/V  no issues overnight .  Brief Narrative:  Per HPI: [name]. [name] is a 86 year old female with past medical history of CKD stage III, chronic atrial fibrillation , prior DVT status post IVC filter placement, diastolic congestive heart failure (Echo 08/2018 EF 55-60%), hypothyroidism and multiple small bowel obstructions in the past (last in 03/2019 and 11/2016) who presented with complaints of abdominal pain nausea and vomiting.  Patient is a poor historian, daughter at the bedside, reported that the patient has been experiencing intermittent nausea for the past several weeks. patient is complaining of lower abdominal pain, dull in quality, radiating around the abdomen in a bandlike distribution, moderate to severe in intensity.  ED: CT imaging of the abdomen revealed evidence of small bowel obstruction with distal transition zone likely secondary to adhesions.  Additionally, there is evidence of bronchiectasis with interstitial fibrosis with areas of patchy airspace infiltrate possibly secondary to pneumonia or edema with a small left pleural effusion.      Assessment & Plan:   Principal Problem:   Small bowel obstruction due to postoperative adhesions Active Problems:   Aspiration pneumonia of both lower lobes due to gastric secretions (HCC)   Chronic diastolic heart failure (HCC)   Chronic kidney disease, stage 3a   Atrial fibrillation, chronic (HCC)   Hyponatremia     Small bowel obstruction due to postoperative adhesions   Patient suffering a recurrent small bowel obstruction, likely secondary to  adhesions  Patient is a longstanding known history of recurrent small bowel obstructions with previous hospitalizations in March 2021 and November 2018.  During each of these hospitalizations no surgical intervention was pursued.  Patient bowel sounds, last night had 1 bowel movement  Monitoring closely,   Patient has refused surgical consultation, and NG tube placement-no active vomiting  Will continue as needed antiemetics, IV fluids  Monitor electrolytes... Repleting accordingly    Serial abdominal imaging to document resolution of obstruction.  Will initiate clear liquid diet  If patient clinically worsens will revisit possible surgical consultation with the patient  Active Problems: Pneumonia of the bilateral lower lobes secondary to gastric secretions   Patchy bilateral lower lobe infiltrates on CT imaging  Continue IV antibiotics  We will follow cultures.  Continue IV Unasyn considering patient's cephalosporin allergy  Bronchodilators bronchodilators  Supplemental oxygen for bouts of hypoxia, maintaining O2 sat >92%  Blood cultures have been ordered    Hyponatremia   Sodium 126 on admission >>> 127  likely secondary to poor oral intake and recent vomiting  We will continue gentle IV fluid hydration  If sodium levels do not correct as expected will expand hyponatremia work-up.    Chronic diastolic heart failure (HCC)   No evidence of cardiogenic volume overload at this time.  Will monitor for evidence of volume overload as we hydrate this patient with intravenous fluids.  Last echo reviewed personally she got all her blood    Chronic kidney disease, stage 3a    Strict input and output monitoring  Monitoring renal function with serial chemistries    Atrial fibrillation, chronic (HCC)  Temporarily holding home regimen of Eliquis both in case of surgical intervention as well as in preparation for possible NG tube placement to avoid  bleeding complications.  Patient is typically on amiodarone 200 mg daily which is being held while patient is n.p.o. and will be started as soon as patient is able to tolerate.  Monitoring patient on telemetry  Hypoalbuminemia -Replacing albumin 25% consult -We will monitor closely    Nutritional status:          Cultures; Blood Cultures x 2 >> NGT   Antimicrobials: 08/16/19  IV Unasyn 3 g Q 12hs >>     Consultants: None   ------------------------------------------------------------------------------------------------------------------------------------  DVT prophylaxis:  SCD/Compression stockings Code Status:   Code Status: DNR Family Communication:  POA dgt, present at bedside-  update The above findings and plan of care has been discussed with patient (and family )  in detail,  they expressed understanding and agreement of above. -Advance care planning has been discussed.   Admission status:    Status is: Inpatient  Remains inpatient appropriate because:Inpatient level of care appropriate due to severity of illness   Dispo: The patient is from: Home              Anticipated d/c is to: Home              Anticipated d/c date is: 3 days              Patient currently is not medically stable to d/c.        Procedures:   No admission procedures for hospital encounter.     Antimicrobials:  Anti-infectives (From admission, onward)   Start     Dose/Rate Route Frequency Ordered Stop   08/16/19 0300  Ampicillin-Sulbactam (UNASYN) 3 g in sodium chloride 0.9 % 100 mL IVPB     Discontinue     3 g 200 mL/hr over 30 Minutes Intravenous Every 12 hours 08/16/19 0251         Medication:  . [START ON 08/19/2019] levothyroxine  75 mcg Intravenous Daily    acetaminophen **OR** acetaminophen, ipratropium-albuterol, morphine injection, ondansetron **OR** ondansetron (ZOFRAN) IV, prochlorperazine   Objective:   Vitals:   08/16/19 0606 08/16/19 0702 08/16/19  1300 08/16/19 1318  BP: (!) 99/43 (!) 109/47 (!) 100/48   Pulse: 54 56 65 63  Resp: (!) 25 22 (!) 27 17  Temp:      TempSrc:      SpO2: 95% 95% 96% 97%    Intake/Output Summary (Last 24 hours) at 08/16/2019 1405 Last data filed at 08/16/2019 1032 Gross per 24 hour  Intake 100 ml  Output --  Net 100 ml   There were no vitals filed for this visit.   Examination:   Physical Exam  Constitution:  Alert, cooperative, no distress,  Appears calm and comfortable  Psychiatric: Normal and stable mood and affect, cognition intact,   HEENT: Normocephalic, PERRL, otherwise with in Normal limits  Chest:Chest symmetric Cardio vascular:  S1/S2, RRR, No murmure, No Rubs or Gallops  pulmonary: Clear to auscultation bilaterally, respirations unlabored, negative wheezes / crackles Abdomen: Soft, non-tender, non-distended, bowel sounds,no masses, no organomegaly  Muscular skeletal: Limited exam - in bed, able to move all 4 extremities, Normal strength,  Neuro: CNII-XII intact. , normal motor and sensation, reflexes intact  Extremities: No pitting edema lower extremities, +2 pulses  Skin: Dry, warm to touch, negative for any Rashes, No open wounds Wounds: per nursing documentation    ------------------------------------------------------------------------------------------------------------------------------------------    LABs:  CBC Latest Ref Rng & Units 08/16/2019 03/17/2019 03/16/2019  WBC 4.0 - 10.5 K/uL 15.6(H) 8.2 8.9  Hemoglobin 12.0 - 15.0 g/dL 14.0 11.1(L) 11.2(L)  Hematocrit 36 - 46 % 41.4 34.2(L) 34.3(L)  Platelets 150 - 400 K/uL 307 229 228   CMP Latest Ref Rng & Units 08/16/2019 08/16/2019 08/16/2019  Glucose 70 - 99 mg/dL 115(H) 120(H) 120(H)  BUN 8 - 23 mg/dL 21 19 17   Creatinine 0.44 - 1.00 mg/dL 1.25(H) 1.10(H) 0.98  Sodium 135 - 145 mmol/L 127(L) 126(L) 126(L)  Potassium 3.5 - 5.1 mmol/L 5.1 4.8 4.6  Chloride 98 - 111 mmol/L 93(L) 91(L) 91(L)  CO2 22 - 32 mmol/L 24 25 24   Calcium 8.9  - 10.3 mg/dL 8.2(L) 8.6(L) 8.7(L)  Total Protein 6.5 - 8.1 g/dL - - -  Total Bilirubin 0.3 - 1.2 mg/dL - - -  Alkaline Phos 38 - 126 U/L - - -  AST 15 - 41 U/L - - -  ALT 0 - 44 U/L - - -       Micro Results Recent Results (from the past 240 hour(s))  SARS Coronavirus 2 by RT PCR (hospital order, performed in [facility] hospital lab) Nasopharyngeal Nasopharyngeal Swab     Status: None   Collection Time: 08/16/19  1:29 AM   Specimen: Nasopharyngeal Swab  Result Value Ref Range Status   SARS Coronavirus 2 NEGATIVE NEGATIVE Final    Comment: (NOTE) SARS-CoV-2 target nucleic acids are NOT DETECTED.  The SARS-CoV-2 RNA is generally detectable in upper and lower respiratory specimens during the acute phase of infection. The lowest concentration of SARS-CoV-2 viral copies this assay can detect is 250 copies / mL. A negative result does not preclude SARS-CoV-2 infection and should not be used as the sole basis for treatment or other patient management decisions.  A negative result may occur with improper specimen collection / handling, submission of specimen other than nasopharyngeal swab, presence of viral mutation(s) within the areas targeted by this assay, and inadequate number of viral copies (<250 copies / mL). A negative result must be combined with clinical observations, patient history, and epidemiological information.  Fact Sheet for Patients:   StrictlyIdeas.no  Fact Sheet for Healthcare Providers: BankingDealers.co.za  This test is not yet approved or  cleared by the [place] FDA and has been authorized for detection and/or diagnosis of SARS-CoV-2 by FDA under an Emergency Use Authorization (EUA).  This EUA will remain in effect (meaning this test can be used) for the duration of the COVID-19 declaration under Section 564(b)(1) of the Act, 21 U.S.C. section 360bbb-3(b)(1), unless the authorization is terminated  or revoked sooner.  Performed at [facility], [place] [address] [place], [place] [address]     Radiology Reports CT ABDOMEN PELVIS WO CONTRAST  Result Date: 08/16/2019 CLINICAL DATA:  Suspected bowel obstruction. Nausea and vomiting with dark emesis. Previous history of small bowel obstruction. EXAM: CT ABDOMEN AND PELVIS WITHOUT CONTRAST TECHNIQUE: Multidetector CT imaging of the abdomen and pelvis was performed following the standard protocol without IV contrast. COMPARISON:  07/12/2019 FINDINGS: Lower chest: Bronchiectasis and interstitial fibrosis in the  lung bases. Mild patchy airspace infiltration may represent inflammatory change, pneumonia, or edema. Small left pleural effusion. Small pericardial effusion. Cardiac enlargement with coronary artery calcification. Tortuous and dilated descending aorta with calcification. Hepatobiliary: The hepatic parenchyma appears diffusely dense, possibly artifact or possibly iron overload. No focal lesions. Increased density of the bile may represent vicarious excretion or milk of calcium. Noncalcified gallstone. No bile duct dilatation. Pancreas: Fatty atrophy of the pancreas. Spleen: Normal in size without focal abnormality. Adrenals/Urinary Tract: No adrenal gland nodules. Kidneys are symmetrical. No hydronephrosis or hydroureter. No renal or ureteral stones. Bladder is decompressed. Stomach/Bowel: Dilated gas and fluid-filled small bowel with air-fluid levels. The terminal ileum is decompressed. Stool-filled colon is nondistended. Changes consistent with small bowel obstruction. Distal transition zone, likely adhesions. Vascular/Lymphatic: Diffuse aortic calcification. Low IVC filter. Dilated aorta at the thoracic inlet measuring about 4.2 cm diameter. No significant lymphadenopathy. Reproductive: Uterus and bilateral adnexa are unremarkable. Other: No abdominal wall hernia or abnormality. No abdominopelvic ascites. Musculoskeletal: Lumbar scoliosis  convex towards the left. Degenerative changes in the lumbar spine and hips. Geographic sclerosis and lucency in the superior right femoral head may indicate avascular necrosis. IMPRESSION: 1. Small bowel obstruction with distal transition zone, likely adhesions. 2. Cardiac enlargement with coronary artery calcification. Small pericardial effusion. 3. Bronchiectasis and interstitial fibrosis in the lung bases. Mild patchy airspace infiltration may represent inflammatory change, pneumonia, or edema. Small left pleural effusion. 4. Dilated aorta at the thoracic inlet measuring about 4.2 cm diameter. 5. Low IVC filter. 6. Geographic sclerosis and lucency in the superior right femoral head may indicate avascular necrosis. 7. Noncalcified gallstone. 8. Increased density of the hepatic parenchyma may represent artifact or iron overload. Aortic Atherosclerosis (ICD10-I70.0). Electronically Signed   By: Lucienne Capers M.D.   On: 08/16/2019 00:55   DG Chest 1 View  Result Date: 08/16/2019 CLINICAL DATA:  Nausea and vomiting for 2 hours. EXAM: CHEST  1 VIEW COMPARISON:  03/14/2019 FINDINGS: Shallow inspiration. Normal heart size and pulmonary vascularity. Patchy perihilar and basilar infiltrates, greater in the left base. Changes may represent pneumonia or aspiration. No pleural effusions. No pneumothorax. Mediastinal contours appear intact. Degenerative changes in the spine and shoulders. Aortic calcification. Incidental note of right rib fractures, some appearing acute. IMPRESSION: Patchy perihilar and basilar infiltrates, greater in the left base. Changes may represent pneumonia or aspiration. Electronically Signed   By: Lucienne Capers M.D.   On: 08/16/2019 03:30   DG Abd 2 Views  Result Date: 08/16/2019 CLINICAL DATA:  Nausea and vomiting for the last 2 hours.  Pneumonia EXAM: ABDOMEN - 2 VIEW COMPARISON:  Abdominal CT from earlier today FINDINGS: Distended small bowel loops with some improvement from earlier  today. No evidence of pneumoperitoneum. Opacity at the lung bases, greater on the left. There is airspace disease and pleural fluid by CT. IVC filter in place. Scoliosis and spinal degeneration. IMPRESSION: 1. Small bowel obstruction by CT earlier the same day. Bowel loops appear less distended than on comparison scout image. 2. Left base infiltrate with small pleural effusion. Electronically Signed   By: Monte Fantasia M.D.   On: 08/16/2019 07:34    SIGNED: Deatra James, MD, FACP, FHM. [facility],  Pager (please use [email or web address] to page/text)  If 7PM-7AM, please contact night-coverage [email or web address], Password [facility] 08/16/2019, 2:05 PM

## 2019-08-17 LAB — MAGNESIUM: Magnesium: 2 mg/dL (ref 1.7–2.4)

## 2019-08-17 LAB — COMPREHENSIVE METABOLIC PANEL
ALT: 15 U/L (ref 0–44)
AST: 20 U/L (ref 15–41)
Albumin: 2.5 g/dL — ABNORMAL LOW (ref 3.5–5.0)
Alkaline Phosphatase: 50 U/L (ref 38–126)
Anion gap: 7 (ref 5–15)
BUN: 15 mg/dL (ref 8–23)
CO2: 28 mmol/L (ref 22–32)
Calcium: 8.2 mg/dL — ABNORMAL LOW (ref 8.9–10.3)
Chloride: 95 mmol/L — ABNORMAL LOW (ref 98–111)
Creatinine, Ser: 0.99 mg/dL (ref 0.44–1.00)
GFR calc Af Amer: 55 mL/min — ABNORMAL LOW (ref >60)
GFR calc non Af Amer: 48 mL/min — ABNORMAL LOW (ref >60)
Glucose, Bld: 94 mg/dL (ref 70–99)
Potassium: 4.2 mmol/L (ref 3.5–5.1)
Sodium: 130 mmol/L — ABNORMAL LOW (ref 135–145)
Total Bilirubin: 0.8 mg/dL (ref 0.3–1.2)
Total Protein: 4.7 g/dL — ABNORMAL LOW (ref 6.5–8.1)

## 2019-08-17 LAB — CBC WITH DIFFERENTIAL/PLATELET
Abs Immature Granulocytes: 0.04 10*3/uL (ref 0.00–0.07)
Basophils Absolute: 0 10*3/uL (ref 0.0–0.1)
Basophils Relative: 0 %
Eosinophils Absolute: 0.1 10*3/uL (ref 0.0–0.5)
Eosinophils Relative: 1 %
HCT: 30.2 % — ABNORMAL LOW (ref 36.0–46.0)
Hemoglobin: 10 g/dL — ABNORMAL LOW (ref 12.0–15.0)
Immature Granulocytes: 1 %
Lymphocytes Relative: 8 %
Lymphs Abs: 0.7 10*3/uL (ref 0.7–4.0)
MCH: 32.5 pg (ref 26.0–34.0)
MCHC: 33.1 g/dL (ref 30.0–36.0)
MCV: 98.1 fL (ref 80.0–100.0)
Monocytes Absolute: 0.7 10*3/uL (ref 0.1–1.0)
Monocytes Relative: 8 %
Neutro Abs: 7.1 10*3/uL (ref 1.7–7.7)
Neutrophils Relative %: 82 %
Platelets: 258 10*3/uL (ref 150–400)
RBC: 3.08 MIL/uL — ABNORMAL LOW (ref 3.87–5.11)
RDW: 13 % (ref 11.5–15.5)
WBC: 8.7 10*3/uL (ref 4.0–10.5)
nRBC: 0 % (ref 0.0–0.2)

## 2019-08-17 MED ORDER — POLYVINYL ALCOHOL 1.4 % OP SOLN
1.0000 [drp] | Freq: Four times a day (QID) | OPHTHALMIC | Status: DC
  Administered 2019-08-17 – 2019-08-23 (×21): 1 [drp] via OPHTHALMIC
  Filled 2019-08-17: qty 15

## 2019-08-17 MED ORDER — POLYETHYLENE GLYCOL 3350 17 G PO PACK
17.0000 g | PACK | Freq: Every day | ORAL | Status: DC
  Administered 2019-08-17 – 2019-08-24 (×6): 17 g via ORAL
  Filled 2019-08-17 (×6): qty 1

## 2019-08-17 MED ORDER — BACID PO TABS
2.0000 | ORAL_TABLET | Freq: Three times a day (TID) | ORAL | Status: DC
  Administered 2019-08-17 – 2019-08-24 (×17): 2 via ORAL
  Filled 2019-08-17 (×24): qty 2

## 2019-08-17 MED ORDER — ENSURE ENLIVE PO LIQD
237.0000 mL | Freq: Two times a day (BID) | ORAL | Status: DC
  Administered 2019-08-17: 237 mL via ORAL

## 2019-08-17 MED ORDER — HYPROMELLOSE (GONIOSCOPIC) 2.5 % OP SOLN
1.0000 [drp] | Freq: Four times a day (QID) | OPHTHALMIC | Status: DC
  Filled 2019-08-17: qty 15

## 2019-08-17 MED ORDER — ADULT MULTIVITAMIN W/MINERALS CH
1.0000 | ORAL_TABLET | Freq: Every day | ORAL | Status: DC
  Administered 2019-08-17 – 2019-08-22 (×6): 1 via ORAL
  Filled 2019-08-17 (×6): qty 1

## 2019-08-17 MED ORDER — MEGESTROL ACETATE 400 MG/10ML PO SUSP
400.0000 mg | Freq: Two times a day (BID) | ORAL | Status: DC
  Administered 2019-08-17 – 2019-08-22 (×9): 400 mg via ORAL
  Filled 2019-08-17 (×11): qty 10

## 2019-08-17 MED ORDER — ARTIFICIAL TEARS OPHTHALMIC OINT
TOPICAL_OINTMENT | Freq: Every day | OPHTHALMIC | Status: DC
  Filled 2019-08-17: qty 3.5

## 2019-08-17 NOTE — Progress Notes (Signed)
PROGRESS NOTE    Patient: [name]                            PCP: Antony Contras, MD                    DOB: 1920/10/09            DOA: 08/16/2019 LOV:[id number]             DOS: 08/17/2019, 12:40 PM   LOS: 1 day   Date of Service: The patient was seen and examined on 08/17/2019  Subjective:   The patient was seen and examined, awake alert, following command, mildly lethargic.  Complaining of mild abdominal pain, reporting of gas and bowel movement last night. Both daughters present at bedside reported p.o. intake has not been great, extensive recurrent history of SBO, and declining physically.  Patient is noted to advance her diet from clear liquid to soft diet Daughters are agreeable to education, in addition of appetite stimulant medication such as Megace  Brief Narrative:  Per HPI: [name] is a 86 year old female with past medical history of CKD stage III, chronic atrial fibrillation , prior DVT status post IVC filter placement, diastolic congestive heart failure (Echo 08/2018 EF 55-60%), hypothyroidism and multiple small bowel obstructions in the past (last in 03/2019 and 11/2016) who presented with complaints of abdominal pain nausea and vomiting.  Patient is a poor historian, daughter at the bedside, reported that the patient has been experiencing intermittent nausea for the past several weeks. patient is complaining of lower abdominal pain, dull in quality, radiating around the abdomen in a bandlike distribution, moderate to severe in intensity.  ED: CT imaging of the abdomen revealed evidence of small bowel obstruction with distal transition zone likely secondary to adhesions.  Additionally, there is evidence of bronchiectasis with interstitial fibrosis with areas of patchy airspace infiltrate possibly secondary to pneumonia or edema with a small left pleural effusion.      Assessment & Plan:   Principal Problem:   Small bowel obstruction due to postoperative  adhesions Active Problems:   Aspiration pneumonia of both lower lobes due to gastric secretions (HCC)   Chronic diastolic heart failure (HCC)   Chronic kidney disease, stage 3a   Atrial fibrillation, chronic (HCC)   Hyponatremia     Small bowel obstruction due to postoperative adhesions   Positive bowel sounds a.m., has 1 BM last night  -No events diet from clear liquid to soft diet today (the patient and daughter agree with plan)    Patient suffering a recurrent small bowel obstruction, likely secondary to adhesions  Patient is a longstanding known history of recurrent small bowel obstructions with previous hospitalizations in March 2021 and November 2018.  During each of these hospitalizations no surgical intervention was pursued.    Monitoring closely,   Patient has refused surgical consultation, and NG tube placement-no active vomiting  Will continue as needed antiemetics, IV fluids  Monitor electrolytes... Repleting accordingly    Serial abdominal imaging to document resolution of obstruction.    If patient clinically worsens will revisit possible surgical consultation with the patient  Active Problems: Pneumonia of the bilateral lower lobes secondary to gastric secretions   Patchy bilateral lower lobe infiltrates on CT imaging  Continue IV antibiotics  We will follow cultures.  Continue IV Unasyn considering patient's cephalosporin allergy  Bronchodilators bronchodilators  Supplemental oxygen for bouts of  hypoxia, maintaining O2 sat >92%  Blood cultures have been ordered    Hyponatremia   Sodium 126 on admission >>> 127 >>130  likely secondary to poor oral intake and recent vomiting  We will continue gentle IV fluid hydration  If sodium levels do not correct as expected will expand hyponatremia work-up.    Chronic diastolic heart failure (HCC)   No evidence of cardiogenic volume overload at this time.  Will monitor for evidence of  volume overload as we hydrate this patient with intravenous fluids.  Last echo reviewed personally she got all her blood    Chronic kidney disease, stage 3a    Strict input and output monitoring  Monitoring renal function with serial chemistries  Creatinine 1.25, 1.23, 0.99 today    Atrial fibrillation, chronic (HCC)  Temporarily holding home regimen of Eliquis both in case of surgical intervention as well as in preparation for possible NG tube placement to avoid bleeding complications.  Patient is typically on amiodarone 200 mg daily which is being held while patient is n.p.o. and will be started as soon as patient is able to tolerate.  Monitoring patient on telemetry  Hypoalbuminemia -Replacing albumin 25% x1 8/2//21 -We will monitor closely  Failure to thrive, malnutrition -Due to poor p.o. intake, multiple comorbidities, and eluding recurrent SBO's, -BMI 18.4, weight 48.6 kg -We will advance diet to soft diet -Consult nutrition for dietary supplements -Per daughters patient has confusion with Marinol, Megace will be added  Nutritional status:  Nutrition Problem: Predicted suboptimal nutrient intake Etiology: acute illness (SBO) Signs/Symptoms: percent weight loss Percent weight loss: 14.3 % Interventions: Ensure Enlive (each supplement provides 350kcal and 20 grams of protein), Magic cup, MVI   Cultures; Blood Cultures x 2 >> NGT   Antimicrobials: 08/16/19  IV Unasyn 3 g Q 12hs >>     Consultants: None   ------------------------------------------------------------------------------------------------------------------------------------  DVT prophylaxis:  SCD/Compression stockings Code Status:   Code Status: DNR Family Communication:  POA dgt, present at bedside-  update The above findings and plan of care has been discussed with patient (and family )  in detail,  they expressed understanding and agreement of above. -Advance care planning has been  discussed.   Admission status:    Status is: Inpatient  Remains inpatient appropriate because:Inpatient level of care appropriate due to severity of illness   Dispo: The patient is from: Home              Anticipated d/c is to: Home              Anticipated d/c date is: 3 days              Patient currently is not medically stable to d/c.        Procedures:   No admission procedures for hospital encounter.     Antimicrobials:  Anti-infectives (From admission, onward)   Start     Dose/Rate Route Frequency Ordered Stop   08/16/19 0300  Ampicillin-Sulbactam (UNASYN) 3 g in sodium chloride 0.9 % 100 mL IVPB     Discontinue     3 g 200 mL/hr over 30 Minutes Intravenous Every 12 hours 08/16/19 0251         Medication:  . amiodarone  200 mg Oral Daily  . apixaban  2.5 mg Oral BID  . feeding supplement (ENSURE ENLIVE)  237 mL Oral BID BM  . gabapentin  200 mg Oral QHS   And  . gabapentin  100 mg Oral BID  .  lactobacillus acidophilus  2 tablet Oral TID  . levothyroxine  150 mcg Oral QAC breakfast  . lubiprostone  8 mcg Oral TID WC  . megestrol  400 mg Oral BID  . multivitamin with minerals  1 tablet Oral Daily  . polyethylene glycol  17 g Oral Daily  . predniSONE  5 mg Oral Daily    acetaminophen **OR** acetaminophen, ipratropium-albuterol, morphine injection, nitroGLYCERIN, ondansetron **OR** ondansetron (ZOFRAN) IV, prochlorperazine   Objective:   Vitals:   08/16/19 1900 08/16/19 2144 08/17/19 0204 08/17/19 0456  BP: (!) 121/53 (!) 158/57 (!) 159/67 (!) 181/71  Pulse: 67 69 64 66  Resp: 18 17 15 15   Temp: 99.1 F (37.3 C) 98.1 F (36.7 C) 97.8 F (36.6 C) (!) 97.5 F (36.4 C)  TempSrc: Axillary Oral Oral Oral  SpO2: 100% 100% 100% 99%    Intake/Output Summary (Last 24 hours) at 08/17/2019 1240 Last data filed at 08/17/2019 1104 Gross per 24 hour  Intake 2005 ml  Output 950 ml  Net 1055 ml   There were no vitals filed for this  visit.   Examination:      Physical Exam:   General:  Alert, oriented, cooperative, no distress; severe diffuse cachexia, both daughters present at bedside  HEENT:  Normocephalic, PERRL, otherwise with in Normal limits   Neuro:  CNII-XII intact. , normal motor and sensation, reflexes intact   Lungs:   Clear to auscultation BL, Respirations unlabored, no wheezes / crackles  Cardio:    S1/S2, RRR, No murmure, No Rubs or Gallops   Abdomen:   Soft, non-tender, bowel sounds active all four quadrants,  no guarding or peritoneal signs.  Muscular skeletal:  Limited exam - in bed, able to move all 4 extremities, severe generalized weakness is noted 2+ pulses,  symmetric, No pitting edema  Skin:  Dry, warm to touch, negative for any Rashes, No open wounds   Wounds: No open wounds observed, please see nurses documentation         ------------------------------------------------------------------------------------------------------------------------------------------    LABs:  CBC Latest Ref Rng & Units 08/17/2019 08/16/2019 03/17/2019  WBC 4.0 - 10.5 K/uL 8.7 15.6(H) 8.2  Hemoglobin 12.0 - 15.0 g/dL 10.0(L) 14.0 11.1(L)  Hematocrit 36 - 46 % 30.2(L) 41.4 34.2(L)  Platelets 150 - 400 K/uL 258 307 229   CMP Latest Ref Rng & Units 08/17/2019 08/16/2019 08/16/2019  Glucose 70 - 99 mg/dL 94 116(H) 115(H)  BUN 8 - 23 mg/dL 15 21 21   Creatinine 0.44 - 1.00 mg/dL 0.99 1.23(H) 1.25(H)  Sodium 135 - 145 mmol/L 130(L) 127(L) 127(L)  Potassium 3.5 - 5.1 mmol/L 4.2 4.7 5.1  Chloride 98 - 111 mmol/L 95(L) 93(L) 93(L)  CO2 22 - 32 mmol/L 28 24 24   Calcium 8.9 - 10.3 mg/dL 8.2(L) 8.3(L) 8.2(L)  Total Protein 6.5 - 8.1 g/dL 4.7(L) - -  Total Bilirubin 0.3 - 1.2 mg/dL 0.8 - -  Alkaline Phos 38 - 126 U/L 50 - -  AST 15 - 41 U/L 20 - -  ALT 0 - 44 U/L 15 - -       Micro Results Recent Results (from the past 240 hour(s))  SARS Coronavirus 2 by RT PCR (hospital order, performed in [facility] hospital  lab) Nasopharyngeal Nasopharyngeal Swab     Status: None   Collection Time: 08/16/19  1:29 AM   Specimen: Nasopharyngeal Swab  Result Value Ref Range Status   SARS Coronavirus 2 NEGATIVE NEGATIVE Final    Comment: (NOTE)  SARS-CoV-2 target nucleic acids are NOT DETECTED.  The SARS-CoV-2 RNA is generally detectable in upper and lower respiratory specimens during the acute phase of infection. The lowest concentration of SARS-CoV-2 viral copies this assay can detect is 250 copies / mL. A negative result does not preclude SARS-CoV-2 infection and should not be used as the sole basis for treatment or other patient management decisions.  A negative result may occur with improper specimen collection / handling, submission of specimen other than nasopharyngeal swab, presence of viral mutation(s) within the areas targeted by this assay, and inadequate number of viral copies (<250 copies / mL). A negative result must be combined with clinical observations, patient history, and epidemiological information.  Fact Sheet for Patients:   StrictlyIdeas.no  Fact Sheet for Healthcare Providers: BankingDealers.co.za  This test is not yet approved or  cleared by the [place] FDA and has been authorized for detection and/or diagnosis of SARS-CoV-2 by FDA under an Emergency Use Authorization (EUA).  This EUA will remain in effect (meaning this test can be used) for the duration of the COVID-19 declaration under Section 564(b)(1) of the Act, 21 U.S.C. section 360bbb-3(b)(1), unless the authorization is terminated or revoked sooner.  Performed at [facility], [place] [address], [place], [place] [address]     Radiology Reports CT ABDOMEN PELVIS WO CONTRAST  Result Date: 08/16/2019 CLINICAL DATA:  Suspected bowel obstruction. Nausea and vomiting with dark emesis. Previous history of small bowel obstruction. EXAM: CT ABDOMEN AND PELVIS WITHOUT  CONTRAST TECHNIQUE: Multidetector CT imaging of the abdomen and pelvis was performed following the standard protocol without IV contrast. COMPARISON:  07/12/2019 FINDINGS: Lower chest: Bronchiectasis and interstitial fibrosis in the lung bases. Mild patchy airspace infiltration may represent inflammatory change, pneumonia, or edema. Small left pleural effusion. Small pericardial effusion. Cardiac enlargement with coronary artery calcification. Tortuous and dilated descending aorta with calcification. Hepatobiliary: The hepatic parenchyma appears diffusely dense, possibly artifact or possibly iron overload. No focal lesions. Increased density of the bile may represent vicarious excretion or milk of calcium. Noncalcified gallstone. No bile duct dilatation. Pancreas: Fatty atrophy of the pancreas. Spleen: Normal in size without focal abnormality. Adrenals/Urinary Tract: No adrenal gland nodules. Kidneys are symmetrical. No hydronephrosis or hydroureter. No renal or ureteral stones. Bladder is decompressed. Stomach/Bowel: Dilated gas and fluid-filled small bowel with air-fluid levels. The terminal ileum is decompressed. Stool-filled colon is nondistended. Changes consistent with small bowel obstruction. Distal transition zone, likely adhesions. Vascular/Lymphatic: Diffuse aortic calcification. Low IVC filter. Dilated aorta at the thoracic inlet measuring about 4.2 cm diameter. No significant lymphadenopathy. Reproductive: Uterus and bilateral adnexa are unremarkable. Other: No abdominal wall hernia or abnormality. No abdominopelvic ascites. Musculoskeletal: Lumbar scoliosis convex towards the left. Degenerative changes in the lumbar spine and hips. Geographic sclerosis and lucency in the superior right femoral head may indicate avascular necrosis. IMPRESSION: 1. Small bowel obstruction with distal transition zone, likely adhesions. 2. Cardiac enlargement with coronary artery calcification. Small pericardial effusion. 3.  Bronchiectasis and interstitial fibrosis in the lung bases. Mild patchy airspace infiltration may represent inflammatory change, pneumonia, or edema. Small left pleural effusion. 4. Dilated aorta at the thoracic inlet measuring about 4.2 cm diameter. 5. Low IVC filter. 6. Geographic sclerosis and lucency in the superior right femoral head may indicate avascular necrosis. 7. Noncalcified gallstone. 8. Increased density of the hepatic parenchyma may represent artifact or iron overload. Aortic Atherosclerosis (ICD10-I70.0). Electronically Signed   By: Lucienne Capers M.D.   On: 08/16/2019 00:55  DG Chest 1 View  Result Date: 08/16/2019 CLINICAL DATA:  Nausea and vomiting for 2 hours. EXAM: CHEST  1 VIEW COMPARISON:  03/14/2019 FINDINGS: Shallow inspiration. Normal heart size and pulmonary vascularity. Patchy perihilar and basilar infiltrates, greater in the left base. Changes may represent pneumonia or aspiration. No pleural effusions. No pneumothorax. Mediastinal contours appear intact. Degenerative changes in the spine and shoulders. Aortic calcification. Incidental note of right rib fractures, some appearing acute. IMPRESSION: Patchy perihilar and basilar infiltrates, greater in the left base. Changes may represent pneumonia or aspiration. Electronically Signed   By: Lucienne Capers M.D.   On: 08/16/2019 03:30   DG Abd 1 View  Result Date: 08/16/2019 CLINICAL DATA:  86 year old female with nausea vomiting. Small-bowel obstruction. EXAM: ABDOMEN - 1 VIEW COMPARISON:  Abdominal radiograph dated 08/16/2019 and CT dated 08/16/2019 FINDINGS: There is moderate stool in the colon. Air is noted within the colon. There is a mildly dilated loop of bowel in the pelvis measuring 3.6 cm, likely a small bowel loop. Overall interval improvement of the dilated bowel loops since the earlier CT and radiograph. No free air. An IVC filter noted. There is degenerative changes of the spine. No acute osseous pathology.  IMPRESSION: Overall interval improvement of the small bowel dilatation since the earlier CT and radiograph. Continued follow-up recommended. Electronically Signed   By: Anner Crete M.D.   On: 08/16/2019 15:38   DG Abd 2 Views  Result Date: 08/16/2019 CLINICAL DATA:  Nausea and vomiting for the last 2 hours.  Pneumonia EXAM: ABDOMEN - 2 VIEW COMPARISON:  Abdominal CT from earlier today FINDINGS: Distended small bowel loops with some improvement from earlier today. No evidence of pneumoperitoneum. Opacity at the lung bases, greater on the left. There is airspace disease and pleural fluid by CT. IVC filter in place. Scoliosis and spinal degeneration. IMPRESSION: 1. Small bowel obstruction by CT earlier the same day. Bowel loops appear less distended than on comparison scout image. 2. Left base infiltrate with small pleural effusion. Electronically Signed   By: Monte Fantasia M.D.   On: 08/16/2019 07:34    SIGNED: Deatra James, MD, FACP, FHM. [facility],  Pager (please use [email or web address] to [email or web address])  If 7PM-7AM, please contact night-coverage [email or web address] 08/17/2019, 12:40 PM

## 2019-08-17 NOTE — Progress Notes (Signed)
Initial Nutrition Assessment  RD working remotely.  DOCUMENTATION CODES:   Underweight  INTERVENTION:   -Ensure Enlive po BID, each supplement provides 350 kcal and 20 grams of protein -Magic cup BID with meals, each supplement provides 290 kcal and 9 grams of protein -MVI with minerals daily  NUTRITION DIAGNOSIS:   Predicted suboptimal nutrient intake related to acute illness (SBO) as evidenced by percent weight loss.  GOAL:   Patient will meet greater than or equal to 90% of their needs  MONITOR:   PO intake, Supplement acceptance, Diet advancement, Labs, Weight trends, Skin, I & O's  REASON FOR ASSESSMENT:   Malnutrition Screening Tool, Consult Assessment of nutrition requirement/status  ASSESSMENT:   86 year old female with past medical history of chronic kidney disease stage III, chronic atrial fibrillation, prior DVT status post IVC filter placement, diastolic congestive heart failure (Echo 08/2018 EF 55-60%), hypothyroidism and multiple small bowel obstructions in the past (last in 03/2019 and 11/2016) who presents to [facility] emergency department with complaints of abdominal pain nausea and vomiting.  Pt admitted with small bowel obstruction secondary to post-operative adhesions.   8/2- advanced to clear liquid diet 8/3- advanced to soft diet  Reviewed I/O's: +1.4 L x 24 hours  UOP: 550 ml x 24 hours  Attempted to speak with pt via hospital room phone, however, no answer.   Pt just advanced to soft diet; no meal completion data available at this time.  Reviewed wt hx; pt has experienced a 14.3% wt loss over the past 11 months, which is not significant for time frame, however, concerning given advanced age and multiple cor-morbidities. Pt is at high risk for malnutrition, however, unable to identify at this time. Pt would greatly benefit from addition of oral nutrition supplements.   Medications reviewed and include megace, miralax, unasyn, and  prednisone.   Albumin has a half-life of 21 days and is strongly affected by stress response and inflammatory process, therefore, do not expect to see an improvement in this lab value during acute hospitalization. When a patient presents with low albumin, it is likely skewed due to the acute inflammatory response. Note that low albumin is no longer used to diagnose malnutrition; [place] uses the new malnutrition guidelines published by the American Society for Parenteral and Enteral Nutrition (A.S.P.E.N.) and the Academy of Nutrition and Dietetics (AND).    Labs reviewed: Na: 130.   Diet Order:   Diet Order            DIET SOFT Room service appropriate? Yes; Fluid consistency: Thin  Diet effective now                 EDUCATION NEEDS:   No education needs have been identified at this time  Skin:  Skin Assessment: Skin Integrity Issues: Skin Integrity Issues:: Incisions Incisions: rt hip  Last BM:  Unknown  Height:   Ht Readings from Last 1 Encounters:  08/11/19 5\' 4"  (1.626 m)    Weight:   Wt Readings from Last 1 Encounters:  08/11/19 48.6 kg    Ideal Body Weight:  54.5 kg  BMI:  There is no height or weight on file to calculate BMI.  Estimated Nutritional Needs:   Kcal:  1450-1650  Protein:  65-80 grams  Fluid:  > 1.4 L    Loistine Chance, RD, LDN, [place] Registered Dietitian II Certified Diabetes Care and Education Specialist Please refer to [facility] for RD and/or RD on-call/weekend/after hours pager

## 2019-08-17 NOTE — Care Management (Signed)
Active with [place] for Blanca RN

## 2019-08-17 NOTE — Plan of Care (Signed)

## 2019-08-18 ENCOUNTER — Telehealth: Payer: Self-pay | Admitting: Interventional Cardiology

## 2019-08-18 ENCOUNTER — Inpatient Hospital Stay (HOSPITAL_COMMUNITY): Payer: Medicare Other

## 2019-08-18 DIAGNOSIS — Z7189 Other specified counseling: Secondary | ICD-10-CM

## 2019-08-18 DIAGNOSIS — R627 Adult failure to thrive: Secondary | ICD-10-CM

## 2019-08-18 DIAGNOSIS — R110 Nausea: Secondary | ICD-10-CM

## 2019-08-18 DIAGNOSIS — R569 Unspecified convulsions: Secondary | ICD-10-CM

## 2019-08-18 DIAGNOSIS — Z66 Do not resuscitate: Secondary | ICD-10-CM

## 2019-08-18 DIAGNOSIS — E43 Unspecified severe protein-calorie malnutrition: Secondary | ICD-10-CM | POA: Insufficient documentation

## 2019-08-18 DIAGNOSIS — J90 Pleural effusion, not elsewhere classified: Secondary | ICD-10-CM

## 2019-08-18 DIAGNOSIS — G8929 Other chronic pain: Secondary | ICD-10-CM

## 2019-08-18 DIAGNOSIS — Z515 Encounter for palliative care: Secondary | ICD-10-CM

## 2019-08-18 DIAGNOSIS — R404 Transient alteration of awareness: Secondary | ICD-10-CM

## 2019-08-18 LAB — BASIC METABOLIC PANEL
Anion gap: 7 (ref 5–15)
BUN: 8 mg/dL (ref 8–23)
CO2: 25 mmol/L (ref 22–32)
Calcium: 8.4 mg/dL — ABNORMAL LOW (ref 8.9–10.3)
Chloride: 99 mmol/L (ref 98–111)
Creatinine, Ser: 0.75 mg/dL (ref 0.44–1.00)
GFR calc Af Amer: >60 mL/min (ref >60)
GFR calc non Af Amer: >60 mL/min (ref >60)
Glucose, Bld: 134 mg/dL — ABNORMAL HIGH (ref 70–99)
Potassium: 3.6 mmol/L (ref 3.5–5.1)
Sodium: 131 mmol/L — ABNORMAL LOW (ref 135–145)

## 2019-08-18 LAB — TROPONIN I (HIGH SENSITIVITY)
Troponin I (High Sensitivity): 83 ng/L — ABNORMAL HIGH (ref <18)
Troponin I (High Sensitivity): 62 ng/L — ABNORMAL HIGH (ref <18)

## 2019-08-18 LAB — BRAIN NATRIURETIC PEPTIDE: B Natriuretic Peptide: 811 pg/mL — ABNORMAL HIGH (ref 0.0–100.0)

## 2019-08-18 MED ORDER — METOCLOPRAMIDE HCL 5 MG/ML IJ SOLN
5.0000 mg | Freq: Three times a day (TID) | INTRAMUSCULAR | Status: DC
  Administered 2019-08-18: 5 mg via INTRAVENOUS
  Filled 2019-08-18: qty 2

## 2019-08-18 MED ORDER — FUROSEMIDE 10 MG/ML IJ SOLN: 20.0000 mg | Freq: Once | INTRAMUSCULAR | Status: DC

## 2019-08-18 MED ORDER — AMIODARONE HCL 200 MG PO TABS
100.0000 mg | ORAL_TABLET | Freq: Every day | ORAL | Status: DC
  Administered 2019-08-19 – 2019-08-24 (×5): 100 mg via ORAL
  Filled 2019-08-18 (×4): qty 1

## 2019-08-18 MED ORDER — BOOST PLUS PO LIQD
237.0000 mL | Freq: Three times a day (TID) | ORAL | Status: DC
  Administered 2019-08-20 – 2019-08-22 (×5): 237 mL via ORAL
  Filled 2019-08-18 (×20): qty 237

## 2019-08-18 MED ORDER — PROCHLORPERAZINE EDISYLATE 10 MG/2ML IJ SOLN
5.0000 mg | Freq: Four times a day (QID) | INTRAMUSCULAR | Status: DC
  Administered 2019-08-18 – 2019-08-20 (×8): 5 mg via INTRAVENOUS
  Filled 2019-08-18 (×8): qty 2

## 2019-08-18 MED ORDER — ACETAMINOPHEN 500 MG PO TABS
1000.0000 mg | ORAL_TABLET | Freq: Three times a day (TID) | ORAL | Status: DC
  Administered 2019-08-19 – 2019-08-24 (×13): 1000 mg via ORAL
  Filled 2019-08-18 (×14): qty 2

## 2019-08-18 MED ORDER — PROCHLORPERAZINE EDISYLATE 10 MG/2ML IJ SOLN
5.0000 mg | Freq: Four times a day (QID) | INTRAMUSCULAR | Status: DC | PRN
  Administered 2019-08-21 – 2019-08-24 (×8): 5 mg via INTRAVENOUS
  Filled 2019-08-18 (×8): qty 2

## 2019-08-18 NOTE — Progress Notes (Addendum)
Called to patient's room.  [name] stated she was "very concerned" re:  Patient's status, though could not be specific.  "She doesn't look right".  "Her eyes look funny" (patient is blind / no deviation from am assessment noted).  They also expressed concern re: patient appearing short of breath.  On assessment, she was CTA in bilateral upper lung fields, had moderate crackles in lef mid and lower and right lower lungs fields.  MD informed and CXR obtained.

## 2019-08-18 NOTE — Consult Note (Addendum)
Neurology Consultation  Reason for Consult: Period of unresponsiveness Referring Physician: Nolberto Hanlon, MD  CC: Period of unresponsiveness  History is obtained from: Daughter and nurse  HPI: [name] is a 86 y.o. female history of paroxysmal atrial fibrillation on NOAC, pulmonary embolism, stage III kidney disease and blindness.  Patient was initially admitted to the hospital for a small bowel obstruction.  Neurology was called to see patient secondary to having a period of unresponsiveness.  Per daughter approximately an hour ago patient was alert oriented and eating Jell-O.  Then suddenly they noted that patient was not answering any questions, sternal rub.  She also had a period of body jerking-when daughter mimicked the body jerking it appeared to look more like myoclonus.  At the time of the event blood pressure was 141/62, glucose 128, pulse 61.  At time of consultation patient apparently was much more reactive than previously.  Per daughter's, she is had no history of spells such as staring spells, jerking spells other than her essential tremor and has no history of seizures.  Past Medical History:  Diagnosis Date  . Blindness   . Blood clot associated with vein wall inflammation   . CKD (chronic kidney disease), stage III   . Colitis   . Goiter    s/p radioactive iod. tx  . Hypothyroidism   . On amiodarone therapy   . Paroxysmal atrial fibrillation (HCC)   . Pulmonary embolus (HCC)    and DVT s/p IVC filter  . SBO (small bowel obstruction) ([place])   . Spinal stenosis of lumbar region with radiculopathy    possible neurogenic claudication recently?  . Temporal arteritis (HCC)    Family History  Problem Relation Age of Onset  . Breast cancer Sister   . Congestive Heart Failure Mother   . Stroke Father   . Crohn's disease Neg Hx   . Ulcerative colitis Neg Hx    Social History:   reports that she has never smoked. She has never used smokeless tobacco. She reports that  she does not drink alcohol and does not use drugs.  Medications  Current Facility-Administered Medications:  .  acetaminophen (TYLENOL) tablet 650 mg, 650 mg, Oral, Q6H PRN, 650 mg at 08/16/19 2029 **OR** acetaminophen (TYLENOL) suppository 650 mg, 650 mg, Rectal, Q6H PRN, Shalhoub, Sherryll Burger, MD .  acetaminophen (TYLENOL) tablet 1,000 mg, 1,000 mg, Oral, TID, Shaffer, Johnell Comings, NP .  amiodarone (PACERONE) tablet 200 mg, 200 mg, Oral, Daily, Shahmehdi, Seyed A, MD, 200 mg at 08/18/19 1125 .  Ampicillin-Sulbactam (UNASYN) 3 g in sodium chloride 0.9 % 100 mL IVPB, 3 g, Intravenous, Q12H, Shalhoub, Sherryll Burger, MD, Last Rate: 200 mL/hr at 08/18/19 1120, 3 g at 08/18/19 1120 .  apixaban (ELIQUIS) tablet 2.5 mg, 2.5 mg, Oral, BID, Shahmehdi, Seyed A, MD, 2.5 mg at 08/18/19 1127 .  artificial tears (LACRILUBE) ophthalmic ointment, , Both Eyes, QHS, Shahmehdi, Seyed A, MD, Given at 08/17/19 2132 .  feeding supplement (ENSURE ENLIVE) (ENSURE ENLIVE) liquid 237 mL, 237 mL, Oral, BID BM, Shahmehdi, Seyed A, MD, 237 mL at 08/17/19 1242 .  gabapentin (NEURONTIN) capsule 200 mg, 200 mg, Oral, QHS, 200 mg at 08/17/19 2131 **AND** gabapentin (NEURONTIN) capsule 100 mg, 100 mg, Oral, BID, Shahmehdi, Seyed A, MD, 100 mg at 08/18/19 1125 .  ipratropium-albuterol (DUONEB) 0.5-2.5 (3) MG/3ML nebulizer solution 3 mL, 3 mL, Nebulization, Q4H PRN, Shalhoub, Sherryll Burger, MD .  lactobacillus acidophilus (BACID) tablet 2 tablet, 2 tablet, Oral,  TID, Deatra James, MD, 2 tablet at 08/18/19 1126 .  levothyroxine (SYNTHROID) tablet 150 mcg, 150 mcg, Oral, QAC breakfast, Shahmehdi, Seyed A, MD, 150 mcg at 08/18/19 0617 .  lubiprostone (AMITIZA) capsule 8 mcg, 8 mcg, Oral, TID WC, Shahmehdi, Seyed A, MD, 8 mcg at 08/17/19 1743 .  megestrol (MEGACE) 400 MG/10ML suspension 400 mg, 400 mg, Oral, BID, Shahmehdi, Seyed A, MD, 400 mg at 08/18/19 1128 .  metoCLOPramide (REGLAN) injection 5 mg, 5 mg, Intravenous, Q8H, Shaffer, Johnell Comings, NP .  multivitamin with minerals tablet 1 tablet, 1 tablet, Oral, Daily, Shahmehdi, Seyed A, MD, 1 tablet at 08/18/19 1126 .  nitroGLYCERIN (NITROSTAT) SL tablet 0.4 mg, 0.4 mg, Sublingual, Q5 min PRN, Shahmehdi, Seyed A, MD .  ondansetron (ZOFRAN) tablet 4 mg, 4 mg, Oral, Q6H PRN, 4 mg at 08/17/19 1241 **OR** ondansetron (ZOFRAN) injection 4 mg, 4 mg, Intravenous, Q6H PRN, Shalhoub, Sherryll Burger, MD, 4 mg at 08/18/19 1048 .  polyethylene glycol (MIRALAX / GLYCOLAX) packet 17 g, 17 g, Oral, Daily, Shahmehdi, Seyed A, MD, 17 g at 08/18/19 1128 .  polyvinyl alcohol (LIQUIFILM TEARS) 1.4 % ophthalmic solution 1 drop, 1 drop, Both Eyes, QID, Shahmehdi, Seyed A, MD, 1 drop at 08/17/19 2136 .  predniSONE (DELTASONE) tablet 5 mg, 5 mg, Oral, Daily, Shahmehdi, Seyed A, MD, 5 mg at 08/18/19 1124 .  prochlorperazine (COMPAZINE) injection 5 mg, 5 mg, Intravenous, Q6H PRN, Shalhoub, Sherryll Burger, MD, 5 mg at 08/17/19 2131  ROS: Unable to obtain due to decreased mental status.   Exam: Current vital signs: BP (!) 143/96 (BP Location: Left Arm)   Pulse 76   Temp 98.4 F (36.9 C) (Oral)   Resp 17   SpO2 96%  Vital signs in last 24 hours: Temp:  [98.2 F (36.8 C)-98.4 F (36.9 C)] 98.4 F (36.9 C) (08/04 0351) Pulse Rate:  [63-76] 76 (08/04 0351) Resp:  [16-18] 17 (08/04 0351) BP: (143-164)/(56-96) 143/96 (08/04 0351) SpO2:  [96 %-100 %] 96 % (08/04 0351)   Constitutional: Cachectic Eyes: No scleral injection HENT: No OP obstrucion Head: Normocephalic.  Cardiovascular: Palpable Respiratory: Effort normal, non-labored breathing GI: Soft.   Skin: WDI  Neuro: Mental Status: Patient easily awoke with voice.  Initially she could not tell the month even with attempting she stated July however she did get her age correct and was able to follow commands Cranial Nerves: II: Significant decreased vision however is able to see light III,IV, VI: EOMI without ptosis or diploplia. Pupils equal, round and  reactive to light V: Facial sensation is symmetric to temperature VII: Facial movement is symmetric.  VIII: hearing is intact to voice. XII: tongue is midline without atrophy or fasciculations.  Motor: Moving all extremities antigravity. No drift Positive essential tremor Sensory: Sensation is symmetric to light touch and temperature in the arms and legs. D intact sensation to SS Cerebellar: FNF intact with essential tremor  Labs I have reviewed labs in epic and the results pertinent to this consultation are:   CBC    Component Value Date/Time   WBC 8.7 08/17/2019 0343   RBC 3.08 (L) 08/17/2019 0343   HGB 10.0 (L) 08/17/2019 0343   HCT 30.2 (L) 08/17/2019 0343   PLT 258 08/17/2019 0343   MCV 98.1 08/17/2019 0343   MCH 32.5 08/17/2019 0343   MCHC 33.1 08/17/2019 0343   RDW 13.0 08/17/2019 0343   LYMPHSABS 0.7 08/17/2019 0343   MONOABS 0.7 08/17/2019 0343   EOSABS  0.1 08/17/2019 0343   BASOSABS 0.0 08/17/2019 0343    CMP     Component Value Date/Time   NA 131 (L) 08/18/2019 0926   NA 138 04/05/2014 1547   K 3.6 08/18/2019 0926   CL 99 08/18/2019 0926   CO2 25 08/18/2019 0926   GLUCOSE 134 (H) 08/18/2019 0926   BUN 8 08/18/2019 0926   BUN 32 04/05/2014 1547   CREATININE 0.75 08/18/2019 0926   CREATININE 1.54 (H) 11/30/2015 1042   CALCIUM 8.4 (L) 08/18/2019 0926   PROT 4.7 (L) 08/17/2019 0343   PROT 6.1 04/05/2014 1547   ALBUMIN 2.5 (L) 08/17/2019 0343   ALBUMIN 4.0 04/05/2014 1547   AST 20 08/17/2019 0343   ALT 15 08/17/2019 0343   ALKPHOS 50 08/17/2019 0343   BILITOT 0.8 08/17/2019 0343   BILITOT 0.3 04/05/2014 1547   GFRNONAA >60 08/18/2019 0926   GFRAA >60 08/18/2019 0926    Imaging I have reviewed the images obtained:  CT-scan of the brain-age-related volume loss with patchy periventricular small vessel disease.  No evidence of infarct and or mass/hemorrhage  [name] PA-C [facility] [phone]  M-F  (9:00 am- 5:00  PM)  08/18/2019, 2:17 PM   I have seen the patient reviewed the above note.  Assessment:  This is a 86 year old female who presented to the hospital secondary to small bowel obstruction.  While in the hospital patient had a transient period of unresponsiveness.  This was also associated with some jerking motions.  Per family thought the last for approximately 1 minute.  At time of consultation patient was awake, and improving.  She was able to follow commands readily  The etiology is not exactly clear, certainly seizure would be a consideration, but I do not think that this is definite.  I think TIA is unlikely has the insult was global.  Impression: -Transient period of unresponsiveness  Recommendations: -EEG -CT head -Orthostatics -At this point we will not start any antiepileptics but will follow results of EEG  Roland Rack, MD [facility] [phone]  If 7pm- 7am, please page neurology on call as listed in [place].  Roland Rack, MD [facility] [phone]  If 7pm- 7am, please page neurology on call as listed in [place].

## 2019-08-18 NOTE — Progress Notes (Signed)
EEG completed, results pending. 

## 2019-08-18 NOTE — Telephone Encounter (Signed)
Did not need this encounter °

## 2019-08-18 NOTE — Progress Notes (Addendum)
[name] stated she called Dr. Thompson Caul office and that "someone told me to have you call one of the cardiologists in the hospital"  She also stated that "I need stat orders written...... I'm the only one doing anything  'stat'".  Daughters are very upset and crying in room.  Primary MD called and alerted as to the situation.  V/S stable at this time. Patient had become unresponsive, but has since regained some consciousness and is answering questions appropriately, but is still lethargic.  I informed the daughter that I have passed all information on to the primary MD, but that I was not able to call a cardiology consult myself.  Dr. Kurtis Bushman came to speak with daughters and to assess patient and situation.

## 2019-08-18 NOTE — TOC Progression Note (Signed)
Transition of Care [facility]) - Progression Note    Patient Details  Name: [name] MRN: [id number] Date of Birth: 08/02/1920  [facility] CM/SW Contact  Jacalyn Lefevre Edson Snowball, RN Phone Number: 08/18/2019, 4:26 PM  Clinical Narrative:     Received a consult for home with hospice. Went to patient's room. Daughters present however, NT providing patient care, will follow up later.  Expected Discharge Plan: [place] Barriers to Discharge: Continued Medical Work up  Expected Discharge Plan and Services Expected Discharge Plan: [place]   Discharge Planning Services: CM Consult Post Acute Care Choice: [place] arrangements for the past 2 months: Single Family Home                 DME Arranged: N/A         HH Arranged: RN, PT           Social Determinants of Health (SDOH) Interventions    Readmission Risk Interventions No flowsheet data found.

## 2019-08-18 NOTE — Procedures (Signed)
Patient Name: [name]  MRN: [id number]  Epilepsy Attending: [name]  Referring Physician/Provider: [name], PA Date: 08/18/2019 Duration: 23.49 mins  Patient history: 86 year old female who initially presented to the hospital secondary to small bowel obstruction.  While in the hospital patient had an episode of transient unresponsiveness associated with some jerking motions.  EEG evaluate for seizures.  Level of alertness: Awake, assleep  AEDs during EEG study: Gabapentin  Technical aspects: This EEG study was done with scalp electrodes positioned according to the 10-20 International system of electrode placement. Electrical activity was acquired at a sampling rate of 500Hz  and reviewed with a high frequency filter of 70Hz  and a low frequency filter of 1Hz . EEG data were recorded continuously and digitally stored.   Description: The posterior dominant rhythm consists of 7 Hz activity of moderate voltage (25-35 uV) seen predominantly in posterior head regions, symmetric and reactive to eye opening and eye closing. Sleep was characterized by vertex waves, sleep spindles (12 to 14 Hz), maximal frontocentral region. EEG showed continuous generalized 6-7 Hz theta slowing. Hyperventilation and photic stimulation were not performed.     Patient was noted to have face/jaw tremor during the study.  Concomitant EEG before, during and after the event did not show any EEG changes suggest seizure.  ABNORMALITY - Continuous slow, generalized - Background slow  IMPRESSION: This study is suggestive of mild diffuse encephalopathy, nonspecific etiology.  No seizures or epileptiform discharges were seen throughout the recording.  Patient was noted to have baseline short-term during the study without concomitant EEG change and was not epileptic.  [name]

## 2019-08-18 NOTE — Plan of Care (Signed)
  Problem: Education: Goal: Knowledge of General Education information will improve Description Including pain rating scale, medication(s)/side effects and non-pharmacologic comfort measures Outcome: Progressing   

## 2019-08-18 NOTE — Consult Note (Addendum)
Cardiology Consultation:   Patient ID: [name] MRN: [id number]; DOB: [birth year]  Admit date: 08/16/2019 Date of Consult: 08/18/2019  Primary Care Provider: Antony Contras, MD [facility] Cardiologist: Sinclair Grooms, MD  [facility] Electrophysiologist:  None    Patient Profile:   [name] is a 86 y.o. female with a hx of paroxysmal atrial fibrillation on amiodarone, prior PE/DVT s/p IVC filter, chronic diastolic heart failure, CKD stage III, and multiple SBO (03/2019, 11/2016 who is being seen today at the request of the family. Dr. Kurtis Bushman did not request this consult.  History of Present Illness:   Ms. [place] follows with Dr. [name] at [facility]. She was seen recently in clinic on 08/11/19 and reported nausea at that time. Dr. [name] noted that amiodarone was unlikely to be causing her nausea. [name] was giving her mother marinol to help with symptoms.   Pt presented to [facility] 08/16/19 with nausea and vomiting. She has several episodes of vomiting prompting ER evaluation. CT was consistent with small bowel obstruction. WBC 15.6. She was admitted to medicine service for further management. It was noted that she has a history of bowel adhesions. Her last SBO resolved without NG tube placement. CT also showed infiltrate possibly secondary to PNA or edema with small left pleural effusion. She received IV hydration. Given leukocytosis, pt was started on IV unasyn. The patient does carry a history of chronic diastolic dysfunction, but was not volume up at the time of admission. Eliquis was held (PAF) for possible surgery vs NG tube placement. PO medications held for PO status, including amiodarone.   Family at bedside requested cardiology consult following a period of unresponsiveness this afternoon.   On my interview, several family members are at bedside and provide much of the history. The patient had an episode of reduced consciousness with some jerking movements around  lunch time. Neurology has been consulted and EEG currently being placed. Head CT without acute findings. Daughters question a cardiac role in her episode of unresponsiveness. They also are concerned about congestive heart failure exacerbation.  Patient is able to answer questions. She denies anginal symptoms but does report feeling short of breath when she tries to take a deep breath.    Past Medical History:  Diagnosis Date  . Blindness   . Blood clot associated with vein wall inflammation   . CKD (chronic kidney disease), stage III   . Colitis   . Goiter    s/p radioactive iod. tx  . Hypothyroidism   . On amiodarone therapy   . Paroxysmal atrial fibrillation (HCC)   . Pulmonary embolus (HCC)    and DVT s/p IVC filter  . SBO (small bowel obstruction) ([place])   . Spinal stenosis of lumbar region with radiculopathy    possible neurogenic claudication recently?  . Temporal arteritis [facility])     Past Surgical History:  Procedure Laterality Date  . APPENDECTOMY  1940  . BACK SURGERY  04/1998   spinal stenosis  . BACK SURGERY  2000   Ruptured Disc  . BREAST BIOPSY     cysts in both breasts  . CARPAL TUNNEL RELEASE  2002  . ivc filter    . PARTIAL HYSTERECTOMY    . TONSILLECTOMY  1934  . TOTAL KNEE ARTHROPLASTY Left 04/1999  . TOTAL KNEE ARTHROPLASTY Right 02/2000     Home Medications:  Prior to Admission medications   Medication Sig Start Date End Date Taking? Authorizing Provider  acetaminophen (TYLENOL)  500 MG tablet Take 500-1,000 mg by mouth See admin instructions. Take 500mg  in the morning, 500mg  midday and 1000mg  at bedtime.   Yes [provider]  amiodarone (PACERONE) 200 MG tablet Take 1 tablet (200 mg total) by mouth daily. 09/11/18  Yes Belva Crome, MD  ASPERCREME LIDOCAINE EX Apply 1 application topically at bedtime. Left back rib   Yes [provider]  Calcium Carbonate-Vitamin D (CALCIUM HIGH POTENCY/VITAMIN D) 600-200 MG-UNIT TABS Take 1 tablet by  mouth daily.    Yes [provider]  CRANBERRY EXTRACT PO Take 2 tablets by mouth 2 (two) times daily.    Yes [provider]  dronabinol (MARINOL) 2.5 MG capsule Take 2.5 mg by mouth 2 (two) times daily.  07/27/19  Yes [provider]  ELIQUIS 2.5 MG TABS tablet TAKE 1 TABLET BY MOUTH TWICE DAILY. 04/22/19  Yes Belva Crome, MD  gabapentin (NEURONTIN) 100 MG capsule Take 100-200 mg by mouth See admin instructions. Take 1 capsule by mouth in the morning, 1 capsule at 3:30p and 2 capsules at bedtime   Yes [provider]  levothyroxine (SYNTHROID, LEVOTHROID) 150 MCG tablet Take 150 mcg by mouth daily before breakfast. BRAND NAME ONLY (Synthroid)   Yes [provider]  lubiprostone (AMITIZA) 8 MCG capsule Take 8 mcg by mouth 3 (three) times daily with meals.    Yes [provider]  Misc Natural Products (GLUCOSAMINE CHONDROITIN COMPLX) TABS Take 1 tablet by mouth 2 (two) times daily.    Yes [provider]  Multiple Vitamins-Minerals (OCUVITE ADULT 50+ PO) Take 1 tablet by mouth daily.   Yes [provider]  nitrofurantoin (MACRODANTIN) 50 MG capsule Take 50 mg by mouth daily at 12 noon.    Yes [provider]  nitroGLYCERIN (NITROSTAT) 0.4 MG SL tablet Place 1 tablet (0.4 mg total) under the tongue every 5 (five) minutes as needed for chest pain. 09/07/18 08/16/19 Yes Belva Crome, MD  predniSONE (DELTASONE) 5 MG tablet Take 5 mg by mouth daily.    Yes [provider]  Probiotic Product ([place]) Take 1 tablet by mouth daily.    Yes [provider]  traMADol (ULTRAM) 50 MG tablet Take 50 mg by mouth at bedtime.    Yes [provider]    Inpatient Medications: Scheduled Meds: . acetaminophen  1,000 mg Oral TID  . amiodarone  200 mg Oral Daily  . apixaban  2.5 mg Oral BID  . artificial tears   Both Eyes QHS  . feeding supplement (ENSURE ENLIVE)  237 mL Oral BID BM  .  gabapentin  200 mg Oral QHS   And  . gabapentin  100 mg Oral BID  . lactobacillus acidophilus  2 tablet Oral TID  . levothyroxine  150 mcg Oral QAC breakfast  . lubiprostone  8 mcg Oral TID WC  . megestrol  400 mg Oral BID  . metoCLOPramide (REGLAN) injection  5 mg Intravenous Q8H  . multivitamin with minerals  1 tablet Oral Daily  . polyethylene glycol  17 g Oral Daily  . polyvinyl alcohol  1 drop Both Eyes QID  . predniSONE  5 mg Oral Daily   Continuous Infusions: . ampicillin-sulbactam (UNASYN) IV 3 g (08/18/19 1120)   PRN Meds: acetaminophen **OR** acetaminophen, ipratropium-albuterol, nitroGLYCERIN, ondansetron **OR** ondansetron (ZOFRAN) IV, prochlorperazine  Allergies:    Allergies  Allergen Reactions  . Hydrocortisone Other (See Comments)  . Sulfa Antibiotics Nausea Only  .  Cefdinir Other (See Comments)    Caused bad diarrhea Caused bad diarrhea Caused bad diarrhea Caused bad diarrhea Caused bad diarrhea  . Cortisone Other (See Comments)    Flushed hands and face  Flushed hands and face Flushed hands and face  Flushed hands and face Flushed hands and face  . Prednisone Other (See Comments)    cuased blood clots Can have very small dose cuased blood clots cuased blood clots Can have very small dose cuased blood clots cuased blood clots  . Sulfasalazine Other (See Comments)    Nausea side effects Stomach pains and cramping Nausea side effects Stomach pains and cramping Stomach pains and cramping  . Sulfonamide Derivatives     Stomach pains and cramping     Social History:   Social History   Socioeconomic History  . Marital status: Widowed    Spouse name: Not on file  . Number of children: 4  . Years of education: College  . Highest education level: Not on file  Occupational History    Employer: RETIRED    Comment: retired  Tobacco Use  . Smoking status: Never Smoker  . Smokeless tobacco: Never Used  Vaping Use  . Vaping Use: Never  assessed  Substance and Sexual Activity  . Alcohol use: No  . Drug use: No  . Sexual activity: Never  Other Topics Concern  . Not on file  Social History Narrative   Patient lives at home alone.  Use walker to get around   Caffeine Use: rarely   Patient is right handed   Social Determinants of Health   Financial Resource Strain:   . Difficulty of Paying Living Expenses:   Food Insecurity:   . Worried About Charity fundraiser in the Last Year:   . Arboriculturist in the Last Year:   Transportation Needs:   . Film/video editor (Medical):   Marland Kitchen Lack of Transportation (Non-Medical):   Physical Activity:   . Days of Exercise per Week:   . Minutes of Exercise per Session:   Stress:   . Feeling of Stress :   Social Connections:   . Frequency of Communication with Friends and Family:   . Frequency of Social Gatherings with Friends and Family:   . Attends Religious Services:   . Active Member of Clubs or Organizations:   . Attends Archivist Meetings:   Marland Kitchen Marital Status:   Intimate Partner Violence:   . Fear of Current or Ex-Partner:   . Emotionally Abused:   Marland Kitchen Physically Abused:   . Sexually Abused:     Family History:    Family History  Problem Relation Age of Onset  . Breast cancer Sister   . Congestive Heart Failure Mother   . Stroke Father   . Crohn's disease Neg Hx   . Ulcerative colitis Neg Hx      ROS:  Please see the history of present illness.   All other ROS reviewed and negative.     Physical Exam/Data:   Vitals:   08/17/19 0456 08/17/19 1522 08/17/19 2025 08/18/19 0351  BP: (!) 181/71 (!) 149/56 (!) 164/70 (!) 143/96  Pulse: 66 63 70 76  Resp: 15 18 16 17   Temp: (!) 97.5 F (36.4 C) 98.3 F (36.8 C) 98.2 F (36.8 C) 98.4 F (36.9 C)  TempSrc: Oral Oral Oral Oral  SpO2: 99% 99% 100% 96%    Intake/Output Summary (Last 24 hours) at 08/18/2019 1425 Last data filed  at 08/18/2019 0240 Gross per 24 hour  Intake 460 ml  Output 300 ml    Net 160 ml   Last 3 Weights 08/11/2019 04/01/2019 03/14/2019  Weight (lbs) 107 lb 3.2 oz 108 lb 113 lb 5.1 oz  Weight (kg) 48.626 kg 48.988 kg 51.4 kg     There is no height or weight on file to calculate BMI.  General:  Elderly female, NAD, EEG being placed HEENT: normal Neck: could not assess JVD - EEG being placed by tech Endocrine:  No thryomegaly Cardiac:  normal S1, S2; RRR; no murmur Lungs:  Clear in upper bases, diminished in lower bases, I do not appreciate crackles or wheezing Abd: soft, nontender, no hepatomegaly  Ext: no edema Musculoskeletal:  No deformities, BUE and BLE strength normal and equal Skin: warm and dry  Neuro:  CNs 2-12 intact, no focal abnormalities noted Psych:  Normal affect   EKG:  The EKG was personally reviewed and demonstrates:  Sinus rhythm HR 64 first degree heart block, LAFB Telemetry:  Telemetry was personally reviewed and demonstrates:  Maintaining sinus rhythm throughout LOC/AMS event  Relevant CV Studies:  Heart monitor 04/28/19:  NSR and sinus bradycardia  Patient had a min HR of 48 bpm, max HR of 75 bpm, and avg HR of 58  No atrial fibrillation   Sinus bradycardia predominates   Echo 09/10/18: 1. The left ventricle has normal systolic function, with an ejection  fraction of 55-60%. The cavity size was normal. There is moderately  increased left ventricular wall thickness. Left ventricular diastolic  Doppler parameters are consistent with  pseudonormalization. Elevated left ventricular end-diastolic pressure No  evidence of left ventricular regional wall motion abnormalities.  2. The right ventricle has normal systolic function. The cavity was  normal. There is no increase in right ventricular wall thickness. Right  ventricular systolic pressure could not be assessed.  3. Left atrial size was mildly dilated.  4. The pericardial effusion is localized near the right atrium and  anterior to the right ventricle.  5. Trivial  pericardial effusion is present.  6. Mild thickening of the mitral valve leaflet. Mild calcification of the  mitral valve leaflet. There is moderate mitral annular calcification  present.  7. The aortic valve is tricuspid. Moderate sclerosis of the aortic valve.  Aortic valve regurgitation is mild by color flow Doppler.  8. The aorta is normal unless otherwise noted.  9. The interatrial septum appears to be lipomatous.   Laboratory Data:  High Sensitivity Troponin:   Recent Labs  Lab 08/18/19 1339  TROPONINIHS 83*     Chemistry Recent Labs  Lab 08/16/19 1933 08/17/19 0343 08/18/19 0926  NA 127* 130* 131*  K 4.7 4.2 3.6  CL 93* 95* 99  CO2 24 28 25   GLUCOSE 116* 94 134*  BUN 21 15 8   CREATININE 1.23* 0.99 0.75  CALCIUM 8.3* 8.2* 8.4*  GFRNONAA 37* 48* >60  GFRAA 43* 55* >60  ANIONGAP 10 7 7     Recent Labs  Lab 08/16/19 0117 08/17/19 0343  PROT 6.0* 4.7*  ALBUMIN 3.3* 2.5*  AST 18 20  ALT 13 15  ALKPHOS 82 50  BILITOT 0.9 0.8   Hematology Recent Labs  Lab 08/16/19 0117 08/17/19 0343  WBC 15.6* 8.7  RBC 4.21 3.08*  HGB 14.0 10.0*  HCT 41.4 30.2*  MCV 98.3 98.1  MCH 33.3 32.5  MCHC 33.8 33.1  RDW 12.7 13.0  PLT 307 258   BNPNo results for input(s):  BNP, PROBNP in the last 168 hours.  DDimer No results for input(s): DDIMER in the last 168 hours.   Radiology/Studies:  CT ABDOMEN PELVIS WO CONTRAST  Result Date: 08/16/2019 CLINICAL DATA:  Suspected bowel obstruction. Nausea and vomiting with dark emesis. Previous history of small bowel obstruction. EXAM: CT ABDOMEN AND PELVIS WITHOUT CONTRAST TECHNIQUE: Multidetector CT imaging of the abdomen and pelvis was performed following the standard protocol without IV contrast. COMPARISON:  07/12/2019 FINDINGS: Lower chest: Bronchiectasis and interstitial fibrosis in the lung bases. Mild patchy airspace infiltration may represent inflammatory change, pneumonia, or edema. Small left pleural effusion. Small  pericardial effusion. Cardiac enlargement with coronary artery calcification. Tortuous and dilated descending aorta with calcification. Hepatobiliary: The hepatic parenchyma appears diffusely dense, possibly artifact or possibly iron overload. No focal lesions. Increased density of the bile may represent vicarious excretion or milk of calcium. Noncalcified gallstone. No bile duct dilatation. Pancreas: Fatty atrophy of the pancreas. Spleen: Normal in size without focal abnormality. Adrenals/Urinary Tract: No adrenal gland nodules. Kidneys are symmetrical. No hydronephrosis or hydroureter. No renal or ureteral stones. Bladder is decompressed. Stomach/Bowel: Dilated gas and fluid-filled small bowel with air-fluid levels. The terminal ileum is decompressed. Stool-filled colon is nondistended. Changes consistent with small bowel obstruction. Distal transition zone, likely adhesions. Vascular/Lymphatic: Diffuse aortic calcification. Low IVC filter. Dilated aorta at the thoracic inlet measuring about 4.2 cm diameter. No significant lymphadenopathy. Reproductive: Uterus and bilateral adnexa are unremarkable. Other: No abdominal wall hernia or abnormality. No abdominopelvic ascites. Musculoskeletal: Lumbar scoliosis convex towards the left. Degenerative changes in the lumbar spine and hips. Geographic sclerosis and lucency in the superior right femoral head may indicate avascular necrosis. IMPRESSION: 1. Small bowel obstruction with distal transition zone, likely adhesions. 2. Cardiac enlargement with coronary artery calcification. Small pericardial effusion. 3. Bronchiectasis and interstitial fibrosis in the lung bases. Mild patchy airspace infiltration may represent inflammatory change, pneumonia, or edema. Small left pleural effusion. 4. Dilated aorta at the thoracic inlet measuring about 4.2 cm diameter. 5. Low IVC filter. 6. Geographic sclerosis and lucency in the superior right femoral head may indicate avascular  necrosis. 7. Noncalcified gallstone. 8. Increased density of the hepatic parenchyma may represent artifact or iron overload. Aortic Atherosclerosis (ICD10-I70.0). Electronically Signed   By: Lucienne Capers M.D.   On: 08/16/2019 00:55   DG Chest 1 View  Result Date: 08/16/2019 CLINICAL DATA:  Nausea and vomiting for 2 hours. EXAM: CHEST  1 VIEW COMPARISON:  03/14/2019 FINDINGS: Shallow inspiration. Normal heart size and pulmonary vascularity. Patchy perihilar and basilar infiltrates, greater in the left base. Changes may represent pneumonia or aspiration. No pleural effusions. No pneumothorax. Mediastinal contours appear intact. Degenerative changes in the spine and shoulders. Aortic calcification. Incidental note of right rib fractures, some appearing acute. IMPRESSION: Patchy perihilar and basilar infiltrates, greater in the left base. Changes may represent pneumonia or aspiration. Electronically Signed   By: Lucienne Capers M.D.   On: 08/16/2019 03:30   DG Abd 1 View  Result Date: 08/16/2019 CLINICAL DATA:  86 year old female with nausea vomiting. Small-bowel obstruction. EXAM: ABDOMEN - 1 VIEW COMPARISON:  Abdominal radiograph dated 08/16/2019 and CT dated 08/16/2019 FINDINGS: There is moderate stool in the colon. Air is noted within the colon. There is a mildly dilated loop of bowel in the pelvis measuring 3.6 cm, likely a small bowel loop. Overall interval improvement of the dilated bowel loops since the earlier CT and radiograph. No free air. An IVC filter noted. There is degenerative changes  of the spine. No acute osseous pathology. IMPRESSION: Overall interval improvement of the small bowel dilatation since the earlier CT and radiograph. Continued follow-up recommended. Electronically Signed   By: Anner Crete M.D.   On: 08/16/2019 15:38   DG CHEST PORT 1 VIEW  Result Date: 08/18/2019 CLINICAL DATA:  Shortness of breath EXAM: PORTABLE CHEST 1 VIEW COMPARISON:  August 16, 2019 FINDINGS: There  is cardiomegaly with a degree of pulmonary venous hypertension. There are pleural effusions bilaterally with apparent areas of consolidation in the lung bases. There is ill-defined opacity throughout the left mid lung and left base regions. There is aortic atherosclerosis. No adenopathy. Bones are osteoporotic. IMPRESSION: There is cardiomegaly with a degree of pulmonary vascular congestion. There are pleural effusions bilaterally. There may be a degree of congestive heart failure. Areas of airspace opacity in the left mid and lower lung regions could be indicative of pneumonia or aspiration. Consolidation in the bases also may be indicative of pneumonia or aspiration. There may be superimposed pulmonary edema; more than one of these entities may be present concurrently. Electronically Signed   By: Lowella Grip III M.D.   On: 08/18/2019 10:39   DG Abd 2 Views  Result Date: 08/16/2019 CLINICAL DATA:  Nausea and vomiting for the last 2 hours.  Pneumonia EXAM: ABDOMEN - 2 VIEW COMPARISON:  Abdominal CT from earlier today FINDINGS: Distended small bowel loops with some improvement from earlier today. No evidence of pneumoperitoneum. Opacity at the lung bases, greater on the left. There is airspace disease and pleural fluid by CT. IVC filter in place. Scoliosis and spinal degeneration. IMPRESSION: 1. Small bowel obstruction by CT earlier the same day. Bowel loops appear less distended than on comparison scout image. 2. Left base infiltrate with small pleural effusion. Electronically Signed   By: Monte Fantasia M.D.   On: 08/16/2019 07:34   {  Assessment and Plan:   Period of unresponsiveness observed by family around 12:30 today - details are unclear - appreciate neurology input and will await EEG results - telemetry reviewed without dangerous arrhythmias during that time - low suspicion that her heart contributed to this episode - hs troponins are being trended - first trop 83 - she has a history of  troponin leak, per Epic   Paroxysmal atrial fibrillation - patient is maintaining sinus rhythm/sinus bradycardia - amiodarone has been restarted as patient is advancing her diet - eliquis on hold for possible surgery - reduce amiodarone to 100 mg daily   Chronic diastolic heart failure stage II - pt does not appear volume up, actually appears dry - she does not require a home diuretic regimen - CXR with pleural effusions, possible congestion - I do not think this is her main issue right now and would hold off on diuretics  - lab presented while I was in the room and I added on a BNP - would let her recover from her current illnesses for now - appreciate right pleural effusion - could consider one time dose of 10 mg IV lasix in the next 24-48 hrs as she recovers from her SBO   Hx of DVT/PE - eliquis on hold, as above   PNA - suspect aspiration PNA from gastric secretions - per primary   Failure to thrive - per primary       For questions or updates, please contact [facility] Please consult [email or web address] for contact info under    Signed, Ledora Bottcher, PA  08/18/2019 2:25 PM  Patient seen and examined. Agree with assessment and plan.  Ms. Strehle is a 86 year old female who is followed by Dr. Pernell Dupre at hisoffice.  Family has requested cardiology input during this admission.  She has a history of paroxysmal atrial fibrillation and has been on chronic amiodarone therapy, chronic diastolic heart failure, prior history of PE/DVT status post IVC filter, who was admitted with nausea vomiting and small bowel obstruction.  Chest x-ray his raise concern for possible infiltrate/pneumonia  as well as pleural effusion more prominent on the right.  She has had poor appetite.  Present renal function is stable with creatinine 0.75 and BUN 8.  Patient has a history of consistently mildly elevated troponins and troponin is 83.  She has chronic anemia with hemoglobin 10 hematocrit  30.2.  Upper study in August 2020 showed function with EF 55 to 60%, moderate LVH, and grade 2 diastolic dysfunction with pseudonormalization and elevated left ventricular end-diastolic pressures.  Her blood pressure is 140/90.  Pulse is regular and she is in sinus rhythm.  ECG shows normal sinus rhythm at 64 with first-degree AV block with a PR interval of 232 ms, left anterior hemiblock, and nonspecific T changes.  There is no significant JVD.  She does have rhonchi at the bases bilaterally right greater than left.  Rhythm is regular.  There is 1/6 systolic murmur.  Nontender.  There is no edema.  She had a transient episode where she had reduced consciousness with jerking motion today.  She was evaluated by neurology CT scan showed age-related volume loss with patchy periventricular small vessel disease without evidence for infarct or mass/hemorrhage.  She has undergone EEG to assess for potential seizure.  Presently, particularly with her nausea, I have suggested that we reduce amiodarone from 200 mg down to 100 mg daily.  With her poor appetite presently today I will not give any diuretic.  However I am checking a BNP level to actively assess for mild heart failure.  If elevated, tomorrow may consider giving very low-dose diuretic.  Will recheck ECG in am.  Recommend follow-up chest x-ray in a.m. to reassess pleural effusion and potential infiltrate.  We will follow-up in am.   Troy Sine, MD, [facility] 08/18/2019 4:09 PM

## 2019-08-18 NOTE — Progress Notes (Addendum)
Cardiology in to assess patient and speak with patient and family.

## 2019-08-18 NOTE — Consult Note (Addendum)
Consultation Note Date: 08/18/2019   Patient Name: [name]  DOB: May 07, 1920  MRN: [id number]  Age / Sex: 86 y.o., female  PCP: Brittany Contras, MD Referring Physician: Nolberto Hanlon, MD  Reason for Consultation: Establishing goals of care  HPI/Patient Profile: 86 y.o. female  with past medical history of CKD, a fib, prior DVT s/p  IVC filter, CHF, hypothyroidism, and multiple SBO admitted on 08/16/2019 with SBO d/t post-op adhesions. Now tolerating soft diet.  Abd xray improving. Patient is also with pneumonia on IV antibiotics. PMT consulted for [place].  Clinical Assessment and Goals of Care: I have reviewed medical records including EPIC notes, labs and imaging, received report from RN and Dr. Kurtis Hamilton, assessed the patient and then met with 3 daughters to discuss diagnosis prognosis, GOC, EOL wishes, disposition and options.  I introduced Palliative Medicine as specialized medical care for people living with serious illness. It focuses on providing relief from the symptoms and stress of a serious illness. The goal is to improve quality of life for both the patient and the family.  We discussed a brief life review of the patient. 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 She lives at home and her daughters rotate staying with her to provide 24/7 care.   As far as functional and nutritional status, they tell me of a decline in function. She has not been ambulatory for the past ~2 months. She needs assistance with all ADLs.  She has had a decreased appetite for a while.   We discussed patient's current illness and what it means in the larger context of patient's on-going co-morbidities.  Natural disease trajectory and expectations at EOL were discussed. They are aware of how fragile [name] is right now. They are aware she may be nearing end of life.  I attempted to elicit values and goals of care important to the patient.  They tell me the  main goal is for [name] to be at home.   The difference between aggressive medical intervention and comfort care was considered in light of the patient's goals of care. For now, they would like to continue current care plan - they are interested in further diagnostics such as EEG, CT scan, and cardiology consult as they feel like answers may provide them with some comfort. We did discuss that pursuing these may not change ultimate outcome and they expressed understanding. May revisit shift to comfort measures if she continues to decline through hospital course.   Discussed with family the importance of continued conversation with family and the medical providers regarding overall plan of care and treatment options, ensuring decisions are within the context of the patient's values and GOCs.    Hospice and Palliative Care services outpatient were explained and offered. Hospice philosophy was discussed - family is interested in hospice care at home.   We discussed symptom. They tell me of chronic back pain. They tell me her continued nausea is most distressing symptom. Zofran and compazine have not been effective at alleviating. We discuss trial of reglan since SBO has improved - they understand risks but would like to try as they feel like providing symptom relief is main goal. Essential tremor noted - will evaluate response to medication tomorrow. Will schedule tylenol.  Questions and concerns were addressed. The family was encouraged to call with questions or concerns.   Primary Decision Maker HCPOA -daughters [name] and [name]    SUMMARY OF RECOMMENDATIONS   - continue current measures for  now and DNR status - comfort measures discussed - if patient with continued decline will revisit but continue will full work up for now - when stable for discharge will go home with hospice support - symptom management: Tylenol 1000 mg TID, low dose reglan scheduled (essential tremor noted, daughters  agreeable to trial, will reevaluate 8/5)  Code Status/Advance Care Planning:  DNR  Prognosis:   Unable to determine  Discharge Planning: Home with Hospice      Primary Diagnoses: Present on Admission: . Small bowel obstruction due to postoperative adhesions . Hyponatremia . (Resolved) Persistent atrial fibrillation ([place]) . Atrial fibrillation, chronic ([place]) . Chronic diastolic heart failure ([place])   I have reviewed the medical record, interviewed the patient and family, and examined the patient. The following aspects are pertinent.  Past Medical History:  Diagnosis Date  . Blindness   . Blood clot associated with vein wall inflammation   . CKD (chronic kidney disease), stage III   . Colitis   . Goiter    s/p radioactive iod. tx  . Hypothyroidism   . On amiodarone therapy   . Paroxysmal atrial fibrillation (HCC)   . Pulmonary embolus (HCC)    and DVT s/p IVC filter  . SBO (small bowel obstruction) ([place])   . Spinal stenosis of lumbar region with radiculopathy    possible neurogenic claudication recently?  . Temporal arteritis ([place])    Social History   Socioeconomic History  . Marital status: Widowed    Spouse name: Not on file  . Number of children: 4  . Years of education: College  . Highest education level: Not on file  Occupational History    Employer: RETIRED    Comment: retired  Tobacco Use  . Smoking status: Never Smoker  . Smokeless tobacco: Never Used  Vaping Use  . Vaping Use: Never assessed  Substance and Sexual Activity  . Alcohol use: No  . Drug use: No  . Sexual activity: Never  Other Topics Concern  . Not on file  Social History Narrative   Patient lives at home alone.  Use walker to get around   Caffeine Use: rarely   Patient is right handed   Social Determinants of Health   Financial Resource Strain:   . Difficulty of Paying Living Expenses:   Food Insecurity:   . Worried About Charity fundraiser in the Last Year:   . Youth worker in the Last Year:   Transportation Needs:   . Film/video editor (Medical):   Marland Kitchen Lack of Transportation (Non-Medical):   Physical Activity:   . Days of Exercise per Week:   . Minutes of Exercise per Session:   Stress:   . Feeling of Stress :   Social Connections:   . Frequency of Communication with Friends and Family:   . Frequency of Social Gatherings with Friends and Family:   . Attends Religious Services:   . Active Member of Clubs or Organizations:   . Attends Archivist Meetings:   Marland Kitchen Marital Status:    Family History  Problem Relation Age of Onset  . Breast cancer Sister   . Congestive Heart Failure Mother   . Stroke Father   . Crohn's disease Neg Hx   . Ulcerative colitis Neg Hx    Scheduled Meds: . acetaminophen  1,000 mg Oral TID  . amiodarone  200 mg Oral Daily  . apixaban  2.5 mg Oral BID  . artificial tears  Both Eyes QHS  . feeding supplement (ENSURE ENLIVE)  237 mL Oral BID BM  . gabapentin  200 mg Oral QHS   And  . gabapentin  100 mg Oral BID  . lactobacillus acidophilus  2 tablet Oral TID  . levothyroxine  150 mcg Oral QAC breakfast  . lubiprostone  8 mcg Oral TID WC  . megestrol  400 mg Oral BID  . metoCLOPramide (REGLAN) injection  5 mg Intravenous Q8H  . multivitamin with minerals  1 tablet Oral Daily  . polyethylene glycol  17 g Oral Daily  . polyvinyl alcohol  1 drop Both Eyes QID  . predniSONE  5 mg Oral Daily   Continuous Infusions: . ampicillin-sulbactam (UNASYN) IV 3 g (08/18/19 1120)   PRN Meds:.acetaminophen **OR** acetaminophen, ipratropium-albuterol, nitroGLYCERIN, ondansetron **OR** ondansetron (ZOFRAN) IV, prochlorperazine Allergies  Allergen Reactions  . Hydrocortisone Other (See Comments)  . Sulfa Antibiotics Nausea Only  . Cefdinir Other (See Comments)    _0   . Cortisone Other (See Comments)    Flushed hands  and face  Flushed hands and face Flushed hands and face  Flushed hands and face Flushed hands and face  . Prednisone Other (See Comments)    cuased blood clots Can have very small dose cuased blood clots cuased blood clots Can have very small dose cuased blood clots cuased blood clots  . Sulfasalazine Other (See Comments)    Nausea side effects Stomach pains and cramping Nausea side effects Stomach pains and cramping Stomach pains and cramping  . Sulfonamide Derivatives     Stomach pains and cramping    Review of Systems  Unable to perform ROS: Mental status change    Physical Exam Constitutional:      Comments: Fatigued, responds to verbal interaction  Pulmonary:     Comments: Mild tachypnea Musculoskeletal:     Right lower leg: No edema.     Left lower leg: No edema.  Skin:    General: Skin is warm and dry.  Neurological:     Comments: Mostly oriented with some confusion, easily redirected     Vital Signs: BP (!) 141/61 (BP Location: Left Arm)   Pulse 62   Temp 98.4 F (36.9 C) (Oral)   Resp 18   SpO2 100%  Pain Scale: 0-10 POSS *See Group Information*: 1-Acceptable,Awake and alert Pain Score: Asleep   SpO2: SpO2: 100 % O2 Device:SpO2: 100 % O2 Flow Rate: .O2 Flow Rate (L/min): 2 L/min  IO: Intake/output summary:   Intake/Output Summary (Last 24 hours) at 08/18/2019 1608 Last data filed at 08/18/2019 1500 Gross per 24 hour  Intake 460 ml  Output 600 ml  Net -140 ml    LBM: Last BM Date: 08/16/19 Baseline Weight:   Most recent weight:       Palliative Assessment/Data: PPS 40%    Time Total: 105 minutes Greater than 50%  of this time was spent counseling and coordinating care related to the above assessment and plan.  Juel Burrow, DNP, AGNP-C Palliative Medicine Team [phone] Pager: [phone]

## 2019-08-18 NOTE — Progress Notes (Signed)
Nutrition Follow-up  DOCUMENTATION CODES:   Severe malnutrition in context of chronic illness, Underweight  INTERVENTION:   -D/c Ensure Enlive po BID, each supplement provides 350 kcal and 20 grams of protein -Boost Plus TID, each supplement provides 360 kcals and 16 grams protein -Continue MVI with minerals daily -Continue Magic cup TID with meals, each supplement provides 290 kcal and 9 grams of protein  NUTRITION DIAGNOSIS:   Severe Malnutrition related to chronic illness (CHF, CKD, history of bowel obstructions) as evidenced by moderate fat depletion, severe fat depletion, moderate muscle depletion, severe muscle depletion, percent weight loss.  Ongoing  GOAL:   Patient will meet greater than or equal to 90% of their needs  Progressing   MONITOR:   PO intake, Supplement acceptance, Diet advancement, Labs, Weight trends, Skin, I & O's  REASON FOR ASSESSMENT:   Malnutrition Screening Tool, Consult Assessment of nutrition requirement/status  ASSESSMENT:   86 year old female with past medical history of chronic kidney disease stage III, chronic atrial fibrillation, prior DVT status post IVC filter placement, diastolic congestive heart failure (Echo 08/2018 EF 55-60%), hypothyroidism and multiple small bowel obstructions in the past (last in 03/2019 and 11/2016) who presents to [facility] emergency department with complaints of abdominal pain nausea and vomiting.  8/2- advanced to clear liquid diet 8/3- advanced to soft diet  Reviewed I/O's: -640 ml x 24 hours and +735 ml since admission  Spoke with pt and daughters at bedside. Pt with very poor appetite at baseline. Daughters are overwhelmed because pt is on a soft diet, but think that diet advancement is was done too soon (think full liquids would be best). Pt is currently drinking mostly juices. Pt alos likes Magic Cup and dark chocolate Boost. RD provided pt with chocolate Ensure per daughter's request. Noted meal  completion 10-50%  Palliative care has been consulted for goals of care.   Medications reviewed and include megestrol and prednisone.   Labs reviewed: Na: 127.   NUTRITION - FOCUSED PHYSICAL EXAM:    Most Recent Value  Orbital Region Severe depletion  Upper Arm Region Severe depletion  Thoracic and Lumbar Region Moderate depletion  Buccal Region Moderate depletion  Temple Region Moderate depletion  Clavicle Bone Region Severe depletion  Clavicle and Acromion Bone Region Severe depletion  Scapular Bone Region Severe depletion  Dorsal Hand Severe depletion  Patellar Region Moderate depletion  Anterior Thigh Region Moderate depletion  Posterior Calf Region Moderate depletion  Edema (RD Assessment) Mild  Hair Reviewed  Eyes Reviewed  Mouth Reviewed  Skin Reviewed  Nails Reviewed       Diet Order:   Diet Order            DIET SOFT Room service appropriate? Yes; Fluid consistency: Thin  Diet effective now                 EDUCATION NEEDS:   No education needs have been identified at this time  Skin:  Skin Assessment: Skin Integrity Issues: Skin Integrity Issues:: Incisions Incisions: rt hip  Last BM:  08/17/19  Height:   Ht Readings from Last 1 Encounters:  08/11/19 5\' 4"  (1.626 m)    Weight:   Wt Readings from Last 1 Encounters:  08/11/19 48.6 kg    Ideal Body Weight:  54.5 kg  BMI:  There is no height or weight on file to calculate BMI.  Estimated Nutritional Needs:   Kcal:  1450-1650  Protein:  65-80 grams  Fluid:  > 1.4  Ricka Burdock, RD, LDN, [place] Registered Dietitian II Certified Diabetes Care and Education Specialist Please refer to [facility] for RD and/or RD on-call/weekend/after hours pager

## 2019-08-18 NOTE — Progress Notes (Signed)
OT Cancellation Note  Patient Details Name: [name] MRN: [id number] DOB: [birth year]   Cancelled Treatment:     Pt had unresponsive episode prior to OT entering the room and was taken down for head CT. Family in room meeting with physician, requesting OT return tomorrow as appropriate.   Zenovia Jarred, MSOT, OTR/L Acute Rehabilitation Services [facility] Office Number: [phone] Pager: [phone]  Zenovia Jarred 08/18/2019, 1:56 PM

## 2019-08-18 NOTE — Consult Note (Addendum)
Referring Provider: Dr. Nolberto Hanlon Primary Care Physician:  Antony Contras, MD Primary Gastroenterologist:  Dr. Watt Climes  Reason for Consultation:  Small bowel obstruction  HPI: [name] is a 86 y.o. female with past medical history listed below to include SBO, prior PE (IVC filter), A fib (on Eliquis), presenting for consultation of SBO.  Patient unresponsive and unable to provide history.  History obtained from 3 daughters at bedside.  Patient with history of small bowel obstruction admitted with worsening nausea and vomiting.  Patient unable to tolerate PO diet at home.  Patient has nausea daily with occasional emesis, though nausea is the chief complaint.  Patient was having loose bowel movements and did not have any episodes of constipation.  No hematemesis, melena, hematochezia.  CT of the abdomen 08/16/2019 showed Small bowel obstruction with distal transition zone, likely adhesions.  Patient was also hospitalized 03/14/2019-03/17/19 with SBO.   Past Medical History:  Diagnosis Date  . Blindness   . Blood clot associated with vein wall inflammation   . CKD (chronic kidney disease), stage III   . Colitis   . Goiter    s/p radioactive iod. tx  . Hypothyroidism   . On amiodarone therapy   . Paroxysmal atrial fibrillation (HCC)   . Pulmonary embolus (HCC)    and DVT s/p IVC filter  . SBO (small bowel obstruction) ([place])   . Spinal stenosis of lumbar region with radiculopathy    possible neurogenic claudication recently?  . Temporal arteritis [facility])     Past Surgical History:  Procedure Laterality Date  . APPENDECTOMY  1940  . BACK SURGERY  04/1998   spinal stenosis  . BACK SURGERY  2000   Ruptured Disc  . BREAST BIOPSY     cysts in both breasts  . CARPAL TUNNEL RELEASE  2002  . ivc filter    . PARTIAL HYSTERECTOMY    . TONSILLECTOMY  1934  . TOTAL KNEE ARTHROPLASTY Left 04/1999  . TOTAL KNEE ARTHROPLASTY Right 02/2000    Prior to Admission medications   Medication Sig  Start Date End Date Taking? Authorizing Provider  acetaminophen (TYLENOL) 500 MG tablet Take 500-1,000 mg by mouth See admin instructions. Take 500mg  in the morning, 500mg  midday and 1000mg  at bedtime.   Yes [provider]  amiodarone (PACERONE) 200 MG tablet Take 1 tablet (200 mg total) by mouth daily. 09/11/18  Yes Belva Crome, MD  ASPERCREME LIDOCAINE EX Apply 1 application topically at bedtime. Left back rib   Yes [provider]  Calcium Carbonate-Vitamin D (CALCIUM HIGH POTENCY/VITAMIN D) 600-200 MG-UNIT TABS Take 1 tablet by mouth daily.    Yes [provider]  CRANBERRY EXTRACT PO Take 2 tablets by mouth 2 (two) times daily.    Yes [provider]  dronabinol (MARINOL) 2.5 MG capsule Take 2.5 mg by mouth 2 (two) times daily.  07/27/19  Yes [provider]  ELIQUIS 2.5 MG TABS tablet TAKE 1 TABLET BY MOUTH TWICE DAILY. 04/22/19  Yes Belva Crome, MD  gabapentin (NEURONTIN) 100 MG capsule Take 100-200 mg by mouth See admin instructions. Take 1 capsule by mouth in the morning, 1 capsule at 3:30p and 2 capsules at bedtime   Yes [provider]  levothyroxine (SYNTHROID, LEVOTHROID) 150 MCG tablet Take 150 mcg by mouth daily before breakfast. BRAND NAME ONLY (Synthroid)   Yes [provider]  lubiprostone (AMITIZA) 8 MCG capsule Take 8 mcg by mouth 3 (three) times daily with meals.  Yes [provider]  Misc Natural Products (GLUCOSAMINE CHONDROITIN COMPLX) TABS Take 1 tablet by mouth 2 (two) times daily.    Yes [provider]  Multiple Vitamins-Minerals (OCUVITE ADULT 50+ PO) Take 1 tablet by mouth daily.   Yes [provider]  nitrofurantoin (MACRODANTIN) 50 MG capsule Take 50 mg by mouth daily at 12 noon.    Yes [provider]  nitroGLYCERIN (NITROSTAT) 0.4 MG SL tablet Place 1 tablet (0.4 mg total) under the tongue every 5 (five) minutes as needed for chest pain. 09/07/18 08/16/19 Yes Belva Crome, MD  predniSONE (DELTASONE) 5 MG tablet Take 5 mg by mouth daily.    Yes [provider]  Probiotic Product (Bend) Take 1 tablet by mouth daily.    Yes [provider]  traMADol (ULTRAM) 50 MG tablet Take 50 mg by mouth at bedtime.    Yes [provider]    Scheduled Meds: . acetaminophen  1,000 mg Oral TID  . [START ON 08/19/2019] amiodarone  100 mg Oral Daily  . apixaban  2.5 mg Oral BID  . artificial tears   Both Eyes QHS  . gabapentin  200 mg Oral QHS   And  . gabapentin  100 mg Oral BID  . lactobacillus acidophilus  2 tablet Oral TID  . lactose free nutrition  237 mL Oral TID WC  . levothyroxine  150 mcg Oral QAC breakfast  . lubiprostone  8 mcg Oral TID WC  . megestrol  400 mg Oral BID  . metoCLOPramide (REGLAN) injection  5 mg Intravenous Q8H  . multivitamin with minerals  1 tablet Oral Daily  . polyethylene glycol  17 g Oral Daily  . polyvinyl alcohol  1 drop Both Eyes QID  . predniSONE  5 mg Oral Daily   Continuous Infusions: . ampicillin-sulbactam (UNASYN) IV 3 g (08/18/19 1120)   PRN Meds:.acetaminophen **OR** acetaminophen, ipratropium-albuterol, nitroGLYCERIN, ondansetron **OR** ondansetron (ZOFRAN) IV, prochlorperazine  Allergies as of 08/16/2019 - Review Complete 08/16/2019  Allergen Reaction Noted  . Hydrocortisone Other (See Comments) 10/14/2017  . Sulfa antibiotics Nausea Only 10/14/2017  . Cefdinir Other (See Comments) 11/06/2018  . Cortisone Other (See Comments) 11/06/2018  . Prednisone Other (See Comments) 03/30/2009  . Sulfasalazine Other (See Comments) 06/29/2013  . Sulfonamide derivatives      Family History  Problem Relation Age of Onset  . Breast cancer Sister   . Congestive Heart Failure Mother   . Stroke Father   . Crohn's disease Neg Hx   . Ulcerative colitis Neg Hx     Social History   Socioeconomic History  . Marital status: Widowed    Spouse name: Not on file  . Number of  children: 4  . Years of education: College  . Highest education level: Not on file  Occupational History    Employer: RETIRED    Comment: retired  Tobacco Use  . Smoking status: Never Smoker  . Smokeless tobacco: Never Used  Vaping Use  . Vaping Use: Never assessed  Substance and Sexual Activity  . Alcohol use: No  . Drug use: No  . Sexual activity: Never  Other Topics Concern  . Not on file  Social History Narrative   Patient lives at home alone.  Use walker to get around   Caffeine Use: rarely   Patient is right handed   Social Determinants of Health   Financial Resource Strain:   . Difficulty of Paying Living Expenses:  Food Insecurity:   . Worried About Charity fundraiser in the Last Year:   . Arboriculturist in the Last Year:   Transportation Needs:   . Film/video editor (Medical):   Marland Kitchen Lack of Transportation (Non-Medical):   Physical Activity:   . Days of Exercise per Week:   . Minutes of Exercise per Session:   Stress:   . Feeling of Stress :   Social Connections:   . Frequency of Communication with Friends and Family:   . Frequency of Social Gatherings with Friends and Family:   . Attends Religious Services:   . Active Member of Clubs or Organizations:   . Attends Archivist Meetings:   Marland Kitchen Marital Status:   Intimate Partner Violence:   . Fear of Current or Ex-Partner:   . Emotionally Abused:   Marland Kitchen Physically Abused:   . Sexually Abused:     Review of Systems: Unable to be obtained (patient unresponsive)  Physical Exam: Vital signs: Vitals:   08/18/19 0351 08/18/19 1300  BP: (!) 143/96 (!) 141/61  Pulse: 76 62  Resp: 17 18  Temp: 98.4 F (36.9 C)   SpO2: 96% 100%   Last BM Date: 08/16/19  Physical Exam Constitutional:      General: She is not in acute distress.    Appearance: She is cachectic. She is ill-appearing.  HENT:     Head: Normocephalic and atraumatic.     Nose: Nose normal.     Comments: [place] in place     Mouth/Throat:     Mouth: Mucous membranes are moist.     Pharynx: Oropharynx is clear.  Eyes:     General: No scleral icterus.    Conjunctiva/sclera: Conjunctivae normal.  Cardiovascular:     Rate and Rhythm: Normal rate. Rhythm irregular.     Pulses: Normal pulses.  Pulmonary:     Effort: Pulmonary effort is normal. No respiratory distress.     Breath sounds: Rales present.  Abdominal:     General: Bowel sounds are normal. There is no distension.     Palpations: Abdomen is soft. There is no mass.     Tenderness: There is no abdominal tenderness. There is no guarding or rebound.     Hernia: No hernia is present.  Musculoskeletal:        General: No swelling or tenderness.     Cervical back: Normal range of motion and neck supple.  Skin:    General: Skin is warm and dry.  Neurological:     General: No focal deficit present.     Mental Status: She is oriented to person, place, and time. She is unresponsive.  Psychiatric:        Mood and Affect: Mood normal.        Behavior: Behavior normal.      GI:  Lab Results: Recent Labs    08/16/19 0117 08/17/19 0343  WBC 15.6* 8.7  HGB 14.0 10.0*  HCT 41.4 30.2*  PLT 307 258   BMET Recent Labs    08/16/19 1933 08/17/19 0343 08/18/19 0926  NA 127* 130* 131*  K 4.7 4.2 3.6  CL 93* 95* 99  CO2 24 28 25   GLUCOSE 116* 94 134*  BUN 21 15 8   CREATININE 1.23* 0.99 0.75  CALCIUM 8.3* 8.2* 8.4*   LFT Recent Labs    08/17/19 0343  PROT 4.7*  ALBUMIN 2.5*  AST 20  ALT 15  ALKPHOS 50  BILITOT 0.8  PT/INR Recent Labs    08/16/19 0540  LABPROT 13.3  INR 1.1     Studies/Results: CT HEAD WO CONTRAST  Result Date: 08/18/2019 CLINICAL DATA:  Altered mental status EXAM: CT HEAD WITHOUT CONTRAST TECHNIQUE: Contiguous axial images were obtained from the base of the skull through the vertex without intravenous contrast. COMPARISON:  Brain MRI August 24, 2018; head CT March 18, 2014 FINDINGS: Brain: Age related volume loss  stable. No intracranial mass, hemorrhage, extra-axial fluid collection, or midline shift. There is patchy small vessel disease in the centra semiovale bilaterally. No evident acute infarct. Vascular: No hyperdense vessel. There is mild calcification in each carotid siphon region. Skull: Bony calvarium a appears intact. Sinuses/Orbits: There is mucosal thickening in several ethmoid air cells. There is a small retention cyst in the posterior left ethmoid region. Other visualized paranasal sinuses are clear. Visualized orbits appear symmetric bilaterally. Other: Mastoid air cells are clear. IMPRESSION: Age related volume loss with patchy periventricular small vessel disease. No evident acute infarct. No mass or hemorrhage. There are foci of arterial vascular calcification. There is mucosal thickening in several ethmoid air cells. Electronically Signed   By: Lowella Grip III M.D.   On: 08/18/2019 14:22   DG CHEST PORT 1 VIEW  Result Date: 08/18/2019 CLINICAL DATA:  Shortness of breath EXAM: PORTABLE CHEST 1 VIEW COMPARISON:  August 16, 2019 FINDINGS: There is cardiomegaly with a degree of pulmonary venous hypertension. There are pleural effusions bilaterally with apparent areas of consolidation in the lung bases. There is ill-defined opacity throughout the left mid lung and left base regions. There is aortic atherosclerosis. No adenopathy. Bones are osteoporotic. IMPRESSION: There is cardiomegaly with a degree of pulmonary vascular congestion. There are pleural effusions bilaterally. There may be a degree of congestive heart failure. Areas of airspace opacity in the left mid and lower lung regions could be indicative of pneumonia or aspiration. Consolidation in the bases also may be indicative of pneumonia or aspiration. There may be superimposed pulmonary edema; more than one of these entities may be present concurrently. Electronically Signed   By: Lowella Grip III M.D.   On: 08/18/2019 10:39     Impression/Plan: Persistent SBO Discussed with pharmacist Albina Billet.  Due to prolonged QT/QTC (556/564), discontinue Zofran and Phenergan.  Compazine recommended as an alternative.    Recommend scheduled Compazine 5 mg every 6h.  An additional 5 mg can be used every 6h as needed. Discontinue Reglan.  If patient continues to have obstructive symptoms, consider NG tube placement.  Patient is not actively vomiting, thus defer at this time.  NPO for now.  Consider surgical consult if symptoms worsen, though patient may not be a surgical candidate.    LOS: 2 days   Salley Slaughter  PA-C 08/18/2019, 4:49 PM  Contact #  [phone]

## 2019-08-18 NOTE — TOC Initial Note (Signed)
Transition of Care [facility]) - Initial/Assessment Note    Patient Details  Name: [name] MRN: [id number] Date of Birth: January 23, 1920  [facility] CM/SW Contact:    Marilu Favre, RN Phone Number: 08/18/2019, 12:20 PM  Clinical Narrative:                 Patient asleep. Spoke to three daughters at bedside, including [name] and [name]. Confirmed face sheet address. [place] from home family takes turns staying with her. Patient active with [facility] prior to admission and family would like to continue services. Will need MD order for [facility] and PT and face to face    Patient has a walker, transfer chair and recliner at home. Family would like a bedside commode.   Will continue to follow.    Expected Discharge Plan: [place] Barriers to Discharge: Continued Medical Work up   Patient Goals and CMS Choice     Choice offered to / list presented to : Adult Children  Expected Discharge Plan and Services Expected Discharge Plan: [place]   Discharge Planning Services: CM Consult Post Acute Care Choice: [place] arrangements for the past 2 months: Single Family Home                 DME Arranged: N/A         HH Arranged: RN, PT          Prior Living Arrangements/Services Living arrangements for the past 2 months: Single Family Home Lives with::  (Family takes turns staying with her 24/7) Patient language and need for interpreter reviewed:: Yes        Need for Family Participation in Patient Care: Yes (Comment) Care giver support system in place?: Yes (comment) Current home services: DME Criminal Activity/Legal Involvement Pertinent to Current Situation/Hospitalization: No - Comment as needed  Activities of Daily Living      Permission Sought/Granted   Permission granted to Hamilton information with : No              Emotional Assessment   Attitude/Demeanor/Rapport: Engaged Affect  (typically observed): Accepting Orientation: : Oriented to Self, Oriented to Place, Oriented to  Time, Oriented to Situation Alcohol / Substance Use: Not Applicable Psych Involvement: No (comment)  Admission diagnosis:  Small bowel obstruction (HCC) [K56.609] SBO (small bowel obstruction) ([place]) [K56.609] Pneumonia [J18.9] Small bowel obstruction due to postoperative adhesions [K91.30] Patient Active Problem List   Diagnosis Date Noted  . Small bowel obstruction due to postoperative adhesions 08/16/2019  . Hyponatremia 08/16/2019  . Atrial fibrillation, chronic ([place]) 03/15/2019  . Small bowel obstruction ([place]) 03/14/2019  . UTI (urinary tract infection) 09/13/2018  . Hypotension   . Anticoagulated on warfarin 11/23/2016  . Encounter for monitoring amiodarone therapy 07/07/2016  . Chronic kidney disease, stage 3a 11/28/2015  . Mild anemia 11/28/2015  . Chronic diastolic heart failure ([place]) 12/29/2013  . SBO (small bowel obstruction) ([place]) 01/17/2013  . Microscopic colitis 01/17/2013  . Leukocytosis 01/17/2013  . Aspiration pneumonia of both lower lobes due to gastric secretions ([place]) 01/17/2013  . CKD (chronic kidney disease), stage IV ([place])   . SYNCOPE 09/21/2008  . Disorder of thyroid 09/17/2008  . TEMPORAL ARTERITIS 09/17/2008  . Arthropathy 09/17/2008  . DYSPNEA ON EXERTION 09/17/2008   PCP:  Antony Contras, MD Pharmacy:   [facility], [address] [place] [address]  Phone: [phone] Fax: [phone]     Social Determinants of Health (SDOH) Interventions    Readmission Risk Interventions No flowsheet data found.

## 2019-08-18 NOTE — Telephone Encounter (Signed)
    Daughter called again, she said pt is in the hospital and unresponsive. She needs a cards to see pt. Advised to let nurse page cards

## 2019-08-18 NOTE — Progress Notes (Signed)
Patient's family members walking through hallways, stating their mother is dying and staff is doing nothing.  The attending physician had been called, and assessed patient.  Cardiology was consulted, as well as neurology.  Patient to have head CT and EEG.  Daughter made statement that "she's already had all that done in the past".  Family to have another consult with palliative care.  They are insisting that this nurse call an "in-house cardiologist".  I informed them that the primary physician was aware of their concerns and would be in the room shortly to evaluate the patient, as well as call in any consults which were deemed necessary.

## 2019-08-18 NOTE — Progress Notes (Signed)
PROGRESS NOTE    [name]  GLO:[id number] DOB: 19-Jan-1920 DOA: 08/16/2019 PCP: Antony Contras, MD    Brief Narrative:  Ms. [name] is a 86 year old female with past medical history of CKD stage III, chronic atrial fibrillation , prior DVT status post IVC filter placement, diastolic congestive heart failure(Echo 08/2018 EF 55-60%), hypothyroidism and multiple small bowel obstructions in the past(last in 03/2019 and 11/2016)who presented with complaints of abdominal pain nausea and vomiting.  Patient is a poor historian, daughter at the bedside, reported that the patient has been experiencing intermittent nausea for the past several weeks. patient is complaining of lower abdominal pain, dull in quality, radiating around the abdomen in a bandlike distribution, moderate to severe in intensity.  ED: CT imaging of the abdomen revealed evidence of small bowel obstruction with distal transition zone likely secondary to adhesions. Additionally, there is evidence of bronchiectasis with interstitial fibrosiswith areas of patchy airspace infiltrate possibly secondary to pneumonia or edema with a small left pleural effusion.     Consultants:   GI, palliative care.  Procedures: CT scan  Antimicrobials:   Unasyn 8/2   Subjective: Patient was seen this morning lying on her side.  2 daughters by bedside very distressed that patient is unable to breathe on her side and she needed to be turned flat.  I did have nursing assistant to come help turned the patient.  Patient on nasal cannula did not appear to be in acute respiratory distress.  Later I was called because patient was unresponsive.  Again went back to examined the patient and patient's eyes was closed she did not respond but vitals were stable.  Objective: Vitals:   08/17/19 1522 08/17/19 2025 08/18/19 0351 08/18/19 1300  BP: (!) 149/56 (!) 164/70 (!) 143/96 (!) 141/61  Pulse: 63 70 76 62  Resp: 18 16 17 18   Temp: 98.3 F  (36.8 C) 98.2 F (36.8 C) 98.4 F (36.9 C)   TempSrc: Oral Oral Oral   SpO2: 99% 100% 96% 100%    Intake/Output Summary (Last 24 hours) at 08/18/2019 1716 Last data filed at 08/18/2019 1500 Gross per 24 hour  Intake 640 ml  Output 600 ml  Net 40 ml   There were no vitals filed for this visit.  Examination:  General exam: Appears calm and comfortable this am.  Respiratory system: minimal fine rales scattered b/l Cardiovascular system: S1 & S2 heard, RRR. 2/6 SM no gallop Gastrointestinal system: Abdomen is nondistended, soft and nontender.  Normal bowel sounds heard. Central nervous system: Alert and oriented x2 not to present date. Not able to do full neuro exam.  Extremities: no edema Skin: warm, dry Psychiatry: not really able to assess.     Data Reviewed: I have personally reviewed following labs and imaging studies  CBC: Recent Labs  Lab 08/16/19 0117 08/17/19 0343  WBC 15.6* 8.7  NEUTROABS 14.0* 7.1  HGB 14.0 10.0*  HCT 41.4 30.2*  MCV 98.3 98.1  PLT 307 188   Basic Metabolic Panel: Recent Labs  Lab 08/16/19 0924 08/16/19 1303 08/16/19 1933 08/17/19 0343 08/18/19 0926  NA 126* 127* 127* 130* 131*  K 4.8 5.1 4.7 4.2 3.6  CL 91* 93* 93* 95* 99  CO2 25 24 24 28 25   GLUCOSE 120* 115* 116* 94 134*  BUN 19 21 21 15 8   CREATININE 1.10* 1.25* 1.23* 0.99 0.75  CALCIUM 8.6* 8.2* 8.3* 8.2* 8.4*  MG  --   --   --  2.0  --    GFR: Estimated Creatinine Clearance: 30.8 mL/min (by C-G formula based on SCr of 0.75 mg/dL). Liver Function Tests: Recent Labs  Lab 08/16/19 0117 08/17/19 0343  AST 18 20  ALT 13 15  ALKPHOS 82 50  BILITOT 0.9 0.8  PROT 6.0* 4.7*  ALBUMIN 3.3* 2.5*   Recent Labs  Lab 08/16/19 0117  LIPASE 23   No results for input(s): AMMONIA in the last 168 hours. Coagulation Profile: Recent Labs  Lab 08/16/19 0540  INR 1.1   Cardiac Enzymes: No results for input(s): CKTOTAL, CKMB, CKMBINDEX, TROPONINI in the last 168 hours. BNP  (last 3 results) No results for input(s): PROBNP in the last 8760 hours. HbA1C: No results for input(s): HGBA1C in the last 72 hours. CBG: No results for input(s): GLUCAP in the last 168 hours. Lipid Profile: No results for input(s): CHOL, HDL, LDLCALC, TRIG, CHOLHDL, LDLDIRECT in the last 72 hours. Thyroid Function Tests: No results for input(s): TSH, T4TOTAL, FREET4, T3FREE, THYROIDAB in the last 72 hours. Anemia Panel: No results for input(s): VITAMINB12, FOLATE, FERRITIN, TIBC, IRON, RETICCTPCT in the last 72 hours. Sepsis Labs: No results for input(s): PROCALCITON, LATICACIDVEN in the last 168 hours.  Recent Results (from the past 240 hour(s))  SARS Coronavirus 2 by RT PCR (hospital order, performed in [facility] hospital lab) Nasopharyngeal Nasopharyngeal Swab     Status: None   Collection Time: 08/16/19  1:29 AM   Specimen: Nasopharyngeal Swab  Result Value Ref Range Status   SARS Coronavirus 2 NEGATIVE NEGATIVE Final    Comment: (NOTE) SARS-CoV-2 target nucleic acids are NOT DETECTED.  The SARS-CoV-2 RNA is generally detectable in upper and lower respiratory specimens during the acute phase of infection. The lowest concentration of SARS-CoV-2 viral copies this assay can detect is 250 copies / mL. A negative result does not preclude SARS-CoV-2 infection and should not be used as the sole basis for treatment or other patient management decisions.  A negative result may occur with improper specimen collection / handling, submission of specimen other than nasopharyngeal swab, presence of viral mutation(s) within the areas targeted by this assay, and inadequate number of viral copies (<250 copies / mL). A negative result must be combined with clinical observations, patient history, and epidemiological information.  Fact Sheet for Patients:   StrictlyIdeas.no  Fact Sheet for Healthcare Providers: BankingDealers.co.za  This  test is not yet approved or  cleared by the [place] FDA and has been authorized for detection and/or diagnosis of SARS-CoV-2 by FDA under an Emergency Use Authorization (EUA).  This EUA will remain in effect (meaning this test can be used) for the duration of the COVID-19 declaration under Section 564(b)(1) of the Act, 21 U.S.C. section 360bbb-3(b)(1), unless the authorization is terminated or revoked sooner.  Performed at [facility], [place] [address], [place], [place] [address]   Culture, blood (routine x 2)     Status: None (Preliminary result)   Collection Time: 08/16/19  5:25 AM   Specimen: BLOOD  Result Value Ref Range Status   Specimen Description BLOOD RIGHT ANTECUBITAL  Final   Special Requests   Final    BOTTLES DRAWN AEROBIC AND ANAEROBIC Blood Culture adequate volume   Culture   Final    NO GROWTH 2 DAYS Performed at [facility], [place] [address], [place], [place] [address]    Report Status PENDING  Incomplete  Culture, blood (routine x 2)     Status: None (  Preliminary result)   Collection Time: 08/16/19  5:26 AM   Specimen: BLOOD  Result Value Ref Range Status   Specimen Description BLOOD LEFT ANTECUBITAL  Final   Special Requests   Final    BOTTLES DRAWN AEROBIC AND ANAEROBIC Blood Culture adequate volume   Culture   Final    NO GROWTH 2 DAYS Performed at [facility], [address], [place], [place] [address]    Report Status PENDING  Incomplete         Radiology Studies: CT HEAD WO CONTRAST  Result Date: 08/18/2019 CLINICAL DATA:  Altered mental status EXAM: CT HEAD WITHOUT CONTRAST TECHNIQUE: Contiguous axial images were obtained from the base of the skull through the vertex without intravenous contrast. COMPARISON:  Brain MRI August 24, 2018; head CT March 18, 2014 FINDINGS: Brain: Age related volume loss stable. No intracranial mass, hemorrhage, extra-axial fluid collection, or midline shift. There is patchy small vessel disease in  the centra semiovale bilaterally. No evident acute infarct. Vascular: No hyperdense vessel. There is mild calcification in each carotid siphon region. Skull: Bony calvarium a appears intact. Sinuses/Orbits: There is mucosal thickening in several ethmoid air cells. There is a small retention cyst in the posterior left ethmoid region. Other visualized paranasal sinuses are clear. Visualized orbits appear symmetric bilaterally. Other: Mastoid air cells are clear. IMPRESSION: Age related volume loss with patchy periventricular small vessel disease. No evident acute infarct. No mass or hemorrhage. There are foci of arterial vascular calcification. There is mucosal thickening in several ethmoid air cells. Electronically Signed   By: Lowella Grip III M.D.   On: 08/18/2019 14:22   DG CHEST PORT 1 VIEW  Result Date: 08/18/2019 CLINICAL DATA:  Shortness of breath EXAM: PORTABLE CHEST 1 VIEW COMPARISON:  August 16, 2019 FINDINGS: There is cardiomegaly with a degree of pulmonary venous hypertension. There are pleural effusions bilaterally with apparent areas of consolidation in the lung bases. There is ill-defined opacity throughout the left mid lung and left base regions. There is aortic atherosclerosis. No adenopathy. Bones are osteoporotic. IMPRESSION: There is cardiomegaly with a degree of pulmonary vascular congestion. There are pleural effusions bilaterally. There may be a degree of congestive heart failure. Areas of airspace opacity in the left mid and lower lung regions could be indicative of pneumonia or aspiration. Consolidation in the bases also may be indicative of pneumonia or aspiration. There may be superimposed pulmonary edema; more than one of these entities may be present concurrently. Electronically Signed   By: Lowella Grip III M.D.   On: 08/18/2019 10:39        Scheduled Meds: . acetaminophen  1,000 mg Oral TID  . [START ON 08/19/2019] amiodarone  100 mg Oral Daily  . apixaban  2.5 mg  Oral BID  . artificial tears   Both Eyes QHS  . gabapentin  200 mg Oral QHS   And  . gabapentin  100 mg Oral BID  . lactobacillus acidophilus  2 tablet Oral TID  . lactose free nutrition  237 mL Oral TID WC  . levothyroxine  150 mcg Oral QAC breakfast  . lubiprostone  8 mcg Oral TID WC  . megestrol  400 mg Oral BID  . metoCLOPramide (REGLAN) injection  5 mg Intravenous Q8H  . multivitamin with minerals  1 tablet Oral Daily  . polyethylene glycol  17 g Oral Daily  . polyvinyl alcohol  1 drop Both Eyes QID  . predniSONE  5 mg Oral Daily  .  prochlorperazine  5 mg Intravenous Q6H   Continuous Infusions: . ampicillin-sulbactam (UNASYN) IV 3 g (08/18/19 1120)    Assessment & Plan:   Principal Problem:   Small bowel obstruction due to postoperative adhesions Active Problems:   Aspiration pneumonia of both lower lobes due to gastric secretions (HCC)   Chronic diastolic heart failure (HCC)   Chronic kidney disease, stage 3a   Atrial fibrillation, chronic (HCC)   Hyponatremia   Adult failure to thrive   Goals of care, counseling/discussion   Other chronic pain   Nausea   Palliative care by specialist   DNR (do not resuscitate)   Small bowel obstruction due to postoperative adhesions  Patient suffering a recurrent small bowel obstruction, likely secondary to adhesions Patient is a longstanding known history of recurrent small bowel obstructions with previous hospitalizations in March 2021 and November 2018.During each of these hospitalizations no surgical intervention was pursued. Patient has refused surgical consultation, and NG tube placement-no active vomiting Still with nausea today. Consulted GI per family's request-input was appreicated- use reglan and compazine Will consult GSX    Pneumonia of the bilateral lower lobes secondary to gastric secretions   Patchy bilateral lower lobe infiltrates on CT imaging  Continue iv abx. unasyn  Now with sob/wheezing.   Ck  cxr this am- revealed vasc. Congestion  Will give lasix 20mg  iv x1, monitor Na closely  Continue bronchodilators nebs   bronchodilators bronchodilators  Blood cultures pending   Hyponatremia   Sodium 126 on admission >>> 127 >>130>>>131  likely secondary to poor oral intake and recent vomiting  Improved with IV fluid however now only vascular congestion will DC IV fluids and give her a small dose of IV Lasix  Need to monitor a.m. level   Acute/Chronic diastolic heart failure (HCC) Mildly overloaded. Chest x-ray with vascular congestion BNP elevated Will give low-dose Lasix Monitor creatinine and electrolytes Family demands for cardiology to get involved, spoke to on-call physician    Chronic kidney disease, stage 3a   Creatinine on admission 1.23, likely prerenal.  Now improved to baseline, creatinine 0.75  Continue to monitor  Atrial fibrillation, chronic (HCC)  Temporarily holding home regimen of Eliquis both in case of surgical intervention as well as in preparation for possible NG tube placement to avoid bleeding complications.  Patient is typically on amiodarone 200 mg daily which is being held while patient is n.p.o. and will be started as soon as patient is able to tolerate. We will continue monitoring her Cards input appreciated today, rec. Decreasing amidarone to 100mg  qd  Hypoalbuminemia -Replaced albumin 25% x1 8/2//21 Continue to monitor.  Failure to thrive, malnutrition -Due to poor p.o. intake, multiple comorbidities, and eluding recurrent SBO's, -BMI 18.4, weight 48.6 kg -currently NPO at this time -Consult nutrition for dietary supplements -Per daughters patient has confusion with Marinol Continue with Megace   Nutritional status:  Nutrition Problem: Predicted suboptimal nutrient intake Etiology: acute illness (SBO) Signs/Symptoms: percent weight loss Percent weight loss: 14.3 % Interventions: Ensure Enlive (each  supplement provides 350kcal and 20 grams of protein), Magic cup, MVI  Addendum: Was called earlier for patient being unresponsive.  I did sternal rub on patient she did not open her eyes.  Vitals were stable.  Order stat EKG Have ordered a CT scan of the head did not reveal any acute abnormality Neurology was consulted, recommended EEG pending. TP elevated, however appears to be chronically elevated. I spoke to all daughters at bedside. One was very anxious, walking  down the hallway stating "she cant do this". Tried to explain to them the  Consults and diagnostic testing ordered. I have also updating Palliative care.   DVT prophylaxis: Eliquis Code Status:DNR Family Communication: Daughter is all updated at bedside Disposition Plan: Home Status is: Inpatient  Remains inpatient appropriate because:Hemodynamically unstable   Dispo: The patient is from: Home              Anticipated d/c is to: Home              Anticipated d/c date is: 1 day              Patient currently is not medically stable to d/c.            LOS: 2 days   Time spent: 45 minutes with >50% on coc    Nolberto Hanlon, MD [facility] Pager [phone]  If 7PM-7AM, please contact night-coverage [email or web address] Password [facility] 08/18/2019, 5:16 PM

## 2019-08-19 ENCOUNTER — Inpatient Hospital Stay (HOSPITAL_COMMUNITY): Payer: Medicare Other

## 2019-08-19 LAB — BASIC METABOLIC PANEL
Anion gap: 11 (ref 5–15)
BUN: 7 mg/dL — ABNORMAL LOW (ref 8–23)
CO2: 26 mmol/L (ref 22–32)
Calcium: 8.6 mg/dL — ABNORMAL LOW (ref 8.9–10.3)
Chloride: 95 mmol/L — ABNORMAL LOW (ref 98–111)
Creatinine, Ser: 0.67 mg/dL (ref 0.44–1.00)
GFR calc Af Amer: >60 mL/min (ref >60)
GFR calc non Af Amer: >60 mL/min (ref >60)
Glucose, Bld: 96 mg/dL (ref 70–99)
Potassium: 3.9 mmol/L (ref 3.5–5.1)
Sodium: 132 mmol/L — ABNORMAL LOW (ref 135–145)

## 2019-08-19 MED ORDER — FUROSEMIDE 10 MG/ML IJ SOLN
20.0000 mg | Freq: Once | INTRAMUSCULAR | Status: AC
  Administered 2019-08-19: 20 mg via INTRAVENOUS
  Filled 2019-08-19: qty 2

## 2019-08-19 MED ORDER — AMLODIPINE BESYLATE 2.5 MG PO TABS
2.5000 mg | ORAL_TABLET | Freq: Every day | ORAL | Status: DC
  Administered 2019-08-19 – 2019-08-24 (×5): 2.5 mg via ORAL
  Filled 2019-08-19 (×4): qty 1

## 2019-08-19 NOTE — Care Management (Signed)
    Durable Medical Equipment  (From admission, onward)         Start     Ordered   08/19/19 1605  For home use only DME Hospital bed  Once       Question Answer Comment  Length of Need Lifetime   Patient has (list medical condition): Small bowel obstruction due to postoperative adhesions   The above medical condition requires: Patient requires the ability to reposition frequently   Head must be elevated greater than: 45 degrees   Bed type Semi-electric   Support Surface: Gel Overlay      08/19/19 1608   08/19/19 1605  For home use only DME 3 n 1  Once       Comments: Drop arm   08/19/19 1608   08/18/19 1225  For home use only DME 3 n 1  Once        08/18/19 1225

## 2019-08-19 NOTE — TOC Progression Note (Addendum)
Transition of Care [facility]) - Progression Note    Patient Details  Name: [name] MRN: [id number] Date of Birth: [birth year]  Transition of Care [facility] CM/SW Contact  Jacalyn Lefevre Edson Snowball, RN Phone Number: 08/19/2019, 12:44 PM  Clinical Narrative:    Spoke to three daughters at bedside. Discussed home with hospice vs home health. They are interested in hospice, and understand they cannot have home health and hospice at the same time.   Provided medicare.gov list of home with hospice agencies. They prefer [facility]. They would like to speak directly to someone from [facility] before making final decision. NCM asked which contact to give [facility] to call. Was given Louise [phone]. NCM called Bradd Canary with [facility] and left message. Awaiting call back.   Patient already has walker, recliner, and transfer chair at home. Daughters take turns staying with her , and have private pay caregiver three nights a week.    NCM spoke with OT and daughters regarding recommendations for DME at home. Hospital bed, drop arm bedside commode, and a bath chair on wheels. OT mentioned a purewick. NCM explained to family , family would need to arrange purewick , 1 800 number on website. They voiced understanding.   Patient on oxygen in hospital  , patient does not have home oxygen.    Will await decision on home health vs hospice. If patient discharges with home health , NCM will arrange DME, if patient discharges with hospice, hospice arranges DME.   Checked with Thedore Mins with [facility] they do not carry bath chairs with wheels.  Chrislyn with [facility] following   Expected Discharge Plan: [place] Barriers to Discharge: Continued Medical Work up  Expected Discharge Plan and Services Expected Discharge Plan: [place]   Discharge Planning Services: CM Consult Post Acute Care Choice: [place] arrangements for the past 2 months: Single  Family Home                 DME Arranged: N/A         HH Arranged: RN, PT           Social Determinants of Health (SDOH) Interventions    Readmission Risk Interventions No flowsheet data found.

## 2019-08-19 NOTE — Progress Notes (Addendum)
Progress Note  Patient Name: [name] Date of Encounter: 08/19/2019  Primary Cardiologist: Sinclair Grooms, MD  Subjective   Patient seems alert today. She is blind, hears better out of left ear. Reports some inspirational discomfort. Breathing stable.  Inpatient Medications    Scheduled Meds: . acetaminophen  1,000 mg Oral TID  . amiodarone  100 mg Oral Daily  . apixaban  2.5 mg Oral BID  . artificial tears   Both Eyes QHS  . gabapentin  200 mg Oral QHS   And  . gabapentin  100 mg Oral BID  . lactobacillus acidophilus  2 tablet Oral TID  . lactose free nutrition  237 mL Oral TID WC  . levothyroxine  150 mcg Oral QAC breakfast  . lubiprostone  8 mcg Oral TID WC  . megestrol  400 mg Oral BID  . multivitamin with minerals  1 tablet Oral Daily  . polyethylene glycol  17 g Oral Daily  . polyvinyl alcohol  1 drop Both Eyes QID  . predniSONE  5 mg Oral Daily  . prochlorperazine  5 mg Intravenous Q6H   Continuous Infusions: . ampicillin-sulbactam (UNASYN) IV 3 g (08/19/19 0959)   PRN Meds: acetaminophen **OR** acetaminophen, ipratropium-albuterol, nitroGLYCERIN, prochlorperazine   Vital Signs    Vitals:   08/18/19 0351 08/18/19 1300 08/18/19 2136 08/19/19 0412  BP: (!) 143/96 (!) 141/61 (!) 187/76 (!) 169/74  Pulse: 76 62 74 69  Resp: 17 18 20 17   Temp: 98.4 F (36.9 C)  97.8 F (36.6 C) 97.8 F (36.6 C)  TempSrc: Oral  Oral Oral  SpO2: 96% 100% 99% 100%    Intake/Output Summary (Last 24 hours) at 08/19/2019 1104 Last data filed at 08/18/2019 1843 Gross per 24 hour  Intake 120 ml  Output 550 ml  Net -430 ml   Last 3 Weights 08/11/2019 04/01/2019 03/14/2019  Weight (lbs) 107 lb 3.2 oz 108 lb 113 lb 5.1 oz  Weight (kg) 48.626 kg 48.988 kg 51.4 kg     Telemetry    NSR - Personally Reviewed  Physical Exam   GEN: Frail cachectic appearing WF in no acute distress.  HEENT: Normocephalic, atraumatic, sclera non-icteric. Neck: No JVD or bruits. Cardiac:  RRR, very soft SEM, no rubs or gallops.  Radials/DP/PT 1+ and equal bilaterally.  Respiratory: Clear to auscultation bilaterally. Breathing is unlabored. GI: Soft, nontender, non-distended, BS +x 4. MS: generalized atrophy noted Extremities: No clubbing or cyanosis. No edema. Distal pedal pulses are 2+ and equal bilaterally. Neuro:  A+O today. Follows commands. Mouth tremor. Hard of hearing Psych:  Responds to questions appropriately with a normal affect, slow cadence of speech  Labs    High Sensitivity Troponin:   Recent Labs  Lab 08/18/19 1339 08/18/19 1544  TROPONINIHS 83* 62*      Cardiac EnzymesNo results for input(s): TROPONINI in the last 168 hours. No results for input(s): TROPIPOC in the last 168 hours.   Chemistry Recent Labs  Lab 08/16/19 0117 08/16/19 0540 08/17/19 0343 08/18/19 0926 08/19/19 0447  NA 125*   < > 130* 131* 132*  K 4.5   < > 4.2 3.6 3.9  CL 90*   < > 95* 99 95*  CO2 30   < > 28 25 26   GLUCOSE 133*   < > 94 134* 96  BUN 14   < > 15 8 7*  CREATININE 0.96   < > 0.99 0.75 0.67  CALCIUM 9.3   < >  8.2* 8.4* 8.6*  PROT 6.0*  --  4.7*  --   --   ALBUMIN 3.3*  --  2.5*  --   --   AST 18  --  20  --   --   ALT 13  --  15  --   --   ALKPHOS 82  --  50  --   --   BILITOT 0.9  --  0.8  --   --   GFRNONAA 50*   < > 48* >60 >60  GFRAA 57*   < > 55* >60 >60  ANIONGAP 5   < > 7 7 11    < > = values in this interval not displayed.     Hematology Recent Labs  Lab 08/16/19 0117 08/17/19 0343  WBC 15.6* 8.7  RBC 4.21 3.08*  HGB 14.0 10.0*  HCT 41.4 30.2*  MCV 98.3 98.1  MCH 33.3 32.5  MCHC 33.8 33.1  RDW 12.7 13.0  PLT 307 258    BNP Recent Labs  Lab 08/18/19 1544  BNP 811.0*     DDimer No results for input(s): DDIMER in the last 168 hours.   Radiology    EEG  Result Date: 08/18/2019 Brittany Havens, MD     08/18/2019  6:10 PM Patient Name: [name] MRN: [id number] Epilepsy Attending: Lora Hamilton Referring Physician/Provider:  [name], PA Date: 08/18/2019 Duration: 23.49 mins Patient history: 86 year old female who initially presented to the hospital secondary to small bowel obstruction.  While in the hospital patient had an episode of transient unresponsiveness associated with some jerking motions.  EEG evaluate for seizures. Level of alertness: Awake, assleep AEDs during EEG study: Gabapentin Technical aspects: This EEG study was done with scalp electrodes positioned according to the 10-20 International system of electrode placement. Electrical activity was acquired at a sampling rate of 500Hz  and reviewed with a high frequency filter of 70Hz  and a low frequency filter of 1Hz . EEG data were recorded continuously and digitally stored. Description: The posterior dominant rhythm consists of 7 Hz activity of moderate voltage (25-35 uV) seen predominantly in posterior head regions, symmetric and reactive to eye opening and eye closing. Sleep was characterized by vertex waves, sleep spindles (12 to 14 Hz), maximal frontocentral region. EEG showed continuous generalized 6-7 Hz theta slowing. Hyperventilation and photic stimulation were not performed.   Patient was noted to have face/jaw tremor during the study.  Concomitant EEG before, during and after the event did not show any EEG changes suggest seizure. ABNORMALITY - Continuous slow, generalized - Background slow IMPRESSION: This study is suggestive of mild diffuse encephalopathy, nonspecific etiology.  No seizures or epileptiform discharges were seen throughout the recording.  Patient was noted to have baseline short-term during the study without concomitant EEG change and was not epileptic. [name]   DG Chest 2 View  Result Date: 08/19/2019 CLINICAL DATA:  Weakness, shortness of breath, pleural effusion EXAM: CHEST - 2 VIEW COMPARISON:  08/18/2019 FINDINGS: Similar elevation of the right hemidiaphragm. There are patchy bilateral opacities. Small bilateral pleural effusions  with bibasilar atelectasis. No pneumothorax. Stable cardiomediastinal contours. No acute osseous abnormality. IMPRESSION: No substantial change since 08/18/2019. Small bilateral pleural effusions with bibasilar atelectasis. Patchy bilateral opacities. Electronically Signed   By: Macy Mis M.D.   On: 08/19/2019 08:09   CT HEAD WO CONTRAST  Result Date: 08/18/2019 CLINICAL DATA:  Altered mental status EXAM: CT HEAD WITHOUT CONTRAST TECHNIQUE: Contiguous axial images were obtained  from the base of the skull through the vertex without intravenous contrast. COMPARISON:  Brain MRI August 24, 2018; head CT March 18, 2014 FINDINGS: Brain: Age related volume loss stable. No intracranial mass, hemorrhage, extra-axial fluid collection, or midline shift. There is patchy small vessel disease in the centra semiovale bilaterally. No evident acute infarct. Vascular: No hyperdense vessel. There is mild calcification in each carotid siphon region. Skull: Bony calvarium a appears intact. Sinuses/Orbits: There is mucosal thickening in several ethmoid air cells. There is a small retention cyst in the posterior left ethmoid region. Other visualized paranasal sinuses are clear. Visualized orbits appear symmetric bilaterally. Other: Mastoid air cells are clear. IMPRESSION: Age related volume loss with patchy periventricular small vessel disease. No evident acute infarct. No mass or hemorrhage. There are foci of arterial vascular calcification. There is mucosal thickening in several ethmoid air cells. Electronically Signed   By: Lowella Grip III M.D.   On: 08/18/2019 14:22   DG CHEST PORT 1 VIEW  Result Date: 08/18/2019 CLINICAL DATA:  Shortness of breath EXAM: PORTABLE CHEST 1 VIEW COMPARISON:  August 16, 2019 FINDINGS: There is cardiomegaly with a degree of pulmonary venous hypertension. There are pleural effusions bilaterally with apparent areas of consolidation in the lung bases. There is ill-defined opacity throughout the  left mid lung and left base regions. There is aortic atherosclerosis. No adenopathy. Bones are osteoporotic. IMPRESSION: There is cardiomegaly with a degree of pulmonary vascular congestion. There are pleural effusions bilaterally. There may be a degree of congestive heart failure. Areas of airspace opacity in the left mid and lower lung regions could be indicative of pneumonia or aspiration. Consolidation in the bases also may be indicative of pneumonia or aspiration. There may be superimposed pulmonary edema; more than one of these entities may be present concurrently. Electronically Signed   By: Lowella Grip III M.D.   On: 08/18/2019 10:39    Cardiac Studies   2D Echo 08/2018 1. The left ventricle has normal systolic function, with an ejection  fraction of 55-60%. The cavity size was normal. There is moderately  increased left ventricular wall thickness. Left ventricular diastolic  Doppler parameters are consistent with  pseudonormalization. Elevated left ventricular end-diastolic pressure No  evidence of left ventricular regional wall motion abnormalities.  2. The right ventricle has normal systolic function. The cavity was  normal. There is no increase in right ventricular wall thickness. Right  ventricular systolic pressure could not be assessed.  3. Left atrial size was mildly dilated.  4. The pericardial effusion is localized near the right atrium and  anterior to the right ventricle.  5. Trivial pericardial effusion is present.  6. Mild thickening of the mitral valve leaflet. Mild calcification of the  mitral valve leaflet. There is moderate mitral annular calcification  present.  7. The aortic valve is tricuspid. Moderate sclerosis of the aortic valve.  Aortic valve regurgitation is mild by color flow Doppler.  8. The aorta is normal unless otherwise noted.  9. The interatrial septum appears to be lipomatous.   Patient Profile     86 y.o. female with paroxysmal  atrial fibrillation on amiodarone, prior PE/DVT s/p IVC filter, chronic diastolic heart failure, CKD stage III, multiple SBO (03/2019, 11/2016) who was admitted with nausea, vomiting and SBO with leukocytosis. CT also showed infiltrate possibly secondary to PNA or edema with small left pleural effusion. She received IV hydration. Given leukocytosis, pt was started on IV antibiotics. Family at bedside requested cardiology consult following  a period of unresponsiveness yesterday afternoon, also raised question of possible CHF exacerbation.   Assessment & Plan    1. SBO and potential PNA superimposed on general FTT - conservative rx thus far - regarding PNA, suspected aspiration from gastric secretions - GI did not feel she was a surgical candidate. Palliative notes reviewed, family asking to speak with surgery and pulmonology today - further per primary team  2. Period of unresponsiveness observed by family - details are unclear - neurology has been following - telemetry reviewed without dangerous arrhythmias during that time -- low suspicion that her heart contributed to this episode - hs troponins are low and flat, not felt to represent ACS (she has a history of troponin leak, per Epic) - no further intervention needed for this  3. Paroxysmal atrial fibrillation - patient is maintaining sinus rhythm/sinus bradycardia - reduced amiodarone to 100 mg daily this admission due to nausea  - Eliquis resumed (on appropriate lower dose)  4. Possible mild acute on chronic diastolic CHF - BNP 726 - will discuss diuretic regimen with MD to include low dose IV vs initiation of oral diuretic - CXR small bilateral pleural effusions with bibasilar atelectasis, patchy bilateral opacities - will request I/Os and daily weights as we are able - f/u hyponatremia in AM  5. Anemia - labs show Hgb historically in the 11-12 range, was 14 on admission in setting of acute illness, at 10.0 yesterday - f/u Hgb with  AM labs otherwise per primary team  5. Essential HTN - SBP 160s this AM, likely would benefit from additional antihypertensive control so we will start amlodipine 2.5mg  daily  For questions or updates, please contact [place] Please consult [email or web address] for contact info under Cardiology/STEMI.  Signed, Charlie Pitter, PA-C 08/19/2019, 11:04 AM     Patient seen and examined. Agree with assessment and plan. Patient is maintaining sinus rhythm at 66 bpm without ectopy.  BNP is 811 consistent with mild volume overload in this patient with chronic diastolic dysfunction.  Follow-up chest x-ray today essentially is unchanged from previously and continues to show bilateral pleural effusions with lung opacity.  Will give Lasix 20 mg IV today.  Blood pressure is elevated.  Will initiate amlodipine 2.5 mg daily.  Had lengthy discussion with patient as well as her 3 daughters.   Troy Sine, MD, [facility] 08/19/2019 11:33 AM

## 2019-08-19 NOTE — Evaluation (Signed)
Occupational Therapy Evaluation Patient Details Name: [name] MRN: [id number] DOB: 08/31/1920 Today's Date: 08/19/2019    History of Present Illness Pt is a 86 y.o. F with significant PMH of CKD stage III, atrial fibrillation, prior DVT s/p IVC filter, diastolic congestive heart failure, and multiple small bowel obstructions who presents with complaints of abdominal pain, nausea, vomiting. CT imaging revealed evidence of SBO with distal transition zone likely secondary to adhesions. Additionally, pt with PNA of bilateral lower lobes secondary to gastric secretions.   Clinical Impression   Pt admitted with above diagnoses, presenting with decreased activity tolerance and generalized weakness limiting ability to engage in BADL at desired level of independence. Pt has plans to go home on hospice, so session largely focused on family education to pts daughters in regards to care for pt at home/equipment. Helped family problem solve dressing, bathing, toileting, pt typical daily schedule, and which equipment would best suit pts needs. Pts w/c does nto fit in the bathroom- reviewed possibility of bath chair on wheels. Discussed safe toilet transfers to [facility] and clothing management (both of pts daughters have physical concerns so ergonomics discussed as well). Educated family on where to acquire equipment if insurance does not cover. Also reviewed resources that will install ramp in the home. Will continue to follow for family education per POC listed below.    Follow Up Recommendations  Other (comment);Supervision/Assistance - 24 hour (home with hospice)    Equipment Recommendations  3 in 1 bedside commode;Hospital bed;Other (comment) (drop arm BSC; bath chair on wheels)    Recommendations for Other Services       Precautions / Restrictions Precautions Precautions: Fall Restrictions Weight Bearing Restrictions: No      Mobility Bed Mobility               General bed mobility  comments: mod A to position pt in bed  Transfers                      Balance                                           ADL either performed or assessed with clinical judgement   ADL Overall ADL's : Needs assistance/impaired Eating/Feeding: Set up;Sitting;Bed level   Grooming: Minimal assistance;Bed level   Upper Body Bathing: Maximal assistance;Bed level   Lower Body Bathing: Total assistance;Bed level   Upper Body Dressing : Maximal assistance;Bed level;Sitting   Lower Body Dressing: Total assistance;Bed level;Sitting/lateral leans   Toilet Transfer: Maximal assistance;+2 for safety/equipment;+2 for physical assistance;BSC Toilet Transfer Details (indicate cue type and reason): pt currently completes stand pivot to [facility] with max A +2 from two daughters. They stated how this was getting difficult. Educated daughters on using drop arm commode for pt to scoot over to toilet for less caregiver strain Teacher, early years/pre- Water quality scientist and Hygiene: Total assistance         General ADL Comments: session largely focused on educating daughters on ADL care with pt current level of function     Vision Patient Visual Report: No change from baseline       Perception     Praxis      Pertinent Vitals/Pain Pain Assessment: Faces Faces Pain Scale: Hurts little more Pain Location: back Pain Descriptors / Indicators: Grimacing;Guarding Pain Intervention(s): Monitored during session     Hand Dominance  Extremity/Trunk Assessment Upper Extremity Assessment Upper Extremity Assessment: Generalized weakness   Lower Extremity Assessment Lower Extremity Assessment: Generalized weakness       Communication Communication Communication: HOH   Cognition Arousal/Alertness: Awake/alert Behavior During Therapy: WFL for tasks assessed/performed Overall Cognitive Status: History of cognitive impairments - at baseline                                  General Comments: Seems to be at baseline cognition, follows 1 step commands. Family reports she has been more drowsy, less interactive.   General Comments       Exercises     Shoulder Instructions      Home Living Family/patient expects to be discharged to:: Private residence Living Arrangements: Children Available Help at Discharge: Family;Available 24 hours/day;Personal care attendant Type of Home: House Home Access: Stairs to enter [employer] of Steps: 1   Home Layout: Able to live on main level with bedroom/bathroom     Bathroom Shower/Tub: Teacher, early years/pre: Standard Bathroom Accessibility: No (cannot fit w/c into bathroom)   Home Equipment: Bedside commode;Walker - 4 wheels;Shower seat;Grab bars - tub/shower;Toilet riser;Wheelchair - manual          Prior Functioning/Environment Level of Independence: Needs assistance  Gait / Transfers Assistance Needed: Pt ambulates short distances ~3 feet, transfers with assist, uses w/c otherwise  ADL's / Homemaking Assistance Needed: requires assist for all ADLs, can self feed            OT Problem List: Decreased strength;Decreased knowledge of use of DME or AE;Decreased knowledge of precautions;Decreased activity tolerance;Impaired balance (sitting and/or standing);Decreased cognition      OT Treatment/Interventions: Self-care/ADL training;Patient/family education;Therapeutic activities;DME and/or AE instruction    OT Goals(Current goals can be found in the care plan section) Acute Rehab OT Goals Patient Stated Goal: home with hospice OT Goal Formulation: With family Time For Goal Achievement: 09/02/19 Potential to Achieve Goals: Fair  OT Frequency: Min 2X/week   Barriers to D/C:            Co-evaluation              AM-PAC OT "6 Clicks" Daily Activity     Outcome Measure Help from another person eating meals?: A Little Help from another person taking care of personal  grooming?: A Little Help from another person toileting, which includes using toliet, bedpan, or urinal?: A Lot Help from another person bathing (including washing, rinsing, drying)?: Total Help from another person to put on and taking off regular upper body clothing?: A Lot Help from another person to put on and taking off regular lower body clothing?: Total 6 Click Score: 12   End of Session Nurse Communication: Mobility status  Activity Tolerance: Patient limited by fatigue Patient left: in bed;with call bell/phone within reach;with family/visitor present  OT Visit Diagnosis: Muscle weakness (generalized) (M62.81);Other abnormalities of gait and mobility (R26.89)                Time: 6433-2951 OT Time Calculation (min): 37 min Charges:  OT General Charges $OT Visit: 1 Visit OT Evaluation $OT Eval Moderate Complexity: 1 Mod OT Treatments $Self Care/Home Management : 8-22 mins  Zenovia Jarred, MSOT, OTR/L Acute Rehabilitation Services [facility] Office Number: [phone] Pager: [phone]  Zenovia Jarred 08/19/2019, 5:23 PM

## 2019-08-19 NOTE — Progress Notes (Signed)
PROGRESS NOTE    [name]  TOI:[id number] DOB: [birth year] DOA: 08/16/2019 PCP: Antony Contras, MD    Brief Narrative:  Ms. Brittany Vesey. Hamilton is a 86 year old female with past medical history of CKD stage III, chronic atrial fibrillation , prior DVT status post IVC filter placement, diastolic congestive heart failure(Echo 08/2018 EF 55-60%), hypothyroidism and multiple small bowel obstructions in the past(last in 03/2019 and 11/2016)who presented with complaints of abdominal pain nausea and vomiting.  Patient is a poor historian, daughter at the bedside, reported that the patient has been experiencing intermittent nausea for the past several weeks. patient is complaining of lower abdominal pain, dull in quality, radiating around the abdomen in a bandlike distribution, moderate to severe in intensity.  ED: CT imaging of the abdomen revealed evidence of small bowel obstruction with distal transition zone likely secondary to adhesions. Additionally, there is evidence of bronchiectasis with interstitial fibrosiswith areas of patchy airspace infiltrate possibly secondary to pneumonia or edema with a small left pleural effusion.     Consultants:   GI, palliative care.cardiology, gsx  Procedures: CT scan  Antimicrobials:   Unasyn 8/2   Subjective: Pt reports still with nausea. No vomiting or abd pain. All daughters at bedside.  Objective: Vitals:   08/18/19 0351 08/18/19 1300 08/18/19 2136 08/19/19 0412  BP: (!) 143/96 (!) 141/61 (!) 187/76 (!) 169/74  Pulse: 76 62 74 69  Resp: 17 18 20 17   Temp: 98.4 F (36.9 C)  97.8 F (36.6 C) 97.8 F (36.6 C)  TempSrc: Oral  Oral Oral  SpO2: 96% 100% 99% 100%    Intake/Output Summary (Last 24 hours) at 08/19/2019 1459 Last data filed at 08/18/2019 1843 Gross per 24 hour  Intake --  Output 550 ml  Net -550 ml   There were no vitals filed for this visit.  Examination:  General exam: calm and comfortable, lying in bed.  nad Respiratory system: more cta , no r/w/r Cardiovascular system: RRR, s1/s2 no gallop Gastrointestinal system: soft, nt/nd, +bs Central nervous system: Alert and oriented  Extremities: no edema, no cyanosis Skin: warm, dry Psychiatry: mood appropriate in cutting setting.    Data Reviewed: I have personally reviewed following labs and imaging studies  CBC: Recent Labs  Lab 08/16/19 0117 08/17/19 0343  WBC 15.6* 8.7  NEUTROABS 14.0* 7.1  HGB 14.0 10.0*  HCT 41.4 30.2*  MCV 98.3 98.1  PLT 307 833   Basic Metabolic Panel: Recent Labs  Lab 08/16/19 1303 08/16/19 1933 08/17/19 0343 08/18/19 0926 08/19/19 0447  NA 127* 127* 130* 131* 132*  K 5.1 4.7 4.2 3.6 3.9  CL 93* 93* 95* 99 95*  CO2 24 24 28 25 26   GLUCOSE 115* 116* 94 134* 96  BUN 21 21 15 8  7*  CREATININE 1.25* 1.23* 0.99 0.75 0.67  CALCIUM 8.2* 8.3* 8.2* 8.4* 8.6*  MG  --   --  2.0  --   --    GFR: Estimated Creatinine Clearance: 30.8 mL/min (by C-G formula based on SCr of 0.67 mg/dL). Liver Function Tests: Recent Labs  Lab 08/16/19 0117 08/17/19 0343  AST 18 20  ALT 13 15  ALKPHOS 82 50  BILITOT 0.9 0.8  PROT 6.0* 4.7*  ALBUMIN 3.3* 2.5*   Recent Labs  Lab 08/16/19 0117  LIPASE 23   No results for input(s): AMMONIA in the last 168 hours. Coagulation Profile: Recent Labs  Lab 08/16/19 0540  INR 1.1   Cardiac Enzymes: No results for input(s):  CKTOTAL, CKMB, CKMBINDEX, TROPONINI in the last 168 hours. BNP (last 3 results) No results for input(s): PROBNP in the last 8760 hours. HbA1C: No results for input(s): HGBA1C in the last 72 hours. CBG: No results for input(s): GLUCAP in the last 168 hours. Lipid Profile: No results for input(s): CHOL, HDL, LDLCALC, TRIG, CHOLHDL, LDLDIRECT in the last 72 hours. Thyroid Function Tests: No results for input(s): TSH, T4TOTAL, FREET4, T3FREE, THYROIDAB in the last 72 hours. Anemia Panel: No results for input(s): VITAMINB12, FOLATE, FERRITIN, TIBC,  IRON, RETICCTPCT in the last 72 hours. Sepsis Labs: No results for input(s): PROCALCITON, LATICACIDVEN in the last 168 hours.  Recent Results (from the past 240 hour(s))  SARS Coronavirus 2 by RT PCR (hospital order, performed in [facility] hospital lab) Nasopharyngeal Nasopharyngeal Swab     Status: None   Collection Time: 08/16/19  1:29 AM   Specimen: Nasopharyngeal Swab  Result Value Ref Range Status   SARS Coronavirus 2 NEGATIVE NEGATIVE Final    Comment: (NOTE) SARS-CoV-2 target nucleic acids are NOT DETECTED.  The SARS-CoV-2 RNA is generally detectable in upper and lower respiratory specimens during the acute phase of infection. The lowest concentration of SARS-CoV-2 viral copies this assay can detect is 250 copies / mL. A negative result does not preclude SARS-CoV-2 infection and should not be used as the sole basis for treatment or other patient management decisions.  A negative result may occur with improper specimen collection / handling, submission of specimen other than nasopharyngeal swab, presence of viral mutation(s) within the areas targeted by this assay, and inadequate number of viral copies (<250 copies / mL). A negative result must be combined with clinical observations, patient history, and epidemiological information.  Fact Sheet for Patients:   StrictlyIdeas.no  Fact Sheet for Healthcare Providers: BankingDealers.co.za  This test is not yet approved or  cleared by the [place] FDA and has been authorized for detection and/or diagnosis of SARS-CoV-2 by FDA under an Emergency Use Authorization (EUA).  This EUA will remain in effect (meaning this test can be used) for the duration of the COVID-19 declaration under Section 564(b)(1) of the Act, 21 U.S.C. section 360bbb-3(b)(1), unless the authorization is terminated or revoked sooner.  Performed at [facility], [place] [address] [place],  [place] [address]   Culture, blood (routine x 2)     Status: None (Preliminary result)   Collection Time: 08/16/19  5:25 AM   Specimen: BLOOD  Result Value Ref Range Status   Specimen Description BLOOD RIGHT ANTECUBITAL  Final   Special Requests   Final    BOTTLES DRAWN AEROBIC AND ANAEROBIC Blood Culture adequate volume   Culture   Final    NO GROWTH 3 DAYS Performed at [facility], [place] [address] [place], [place] [address]    Report Status PENDING  Incomplete  Culture, blood (routine x 2)     Status: None (Preliminary result)   Collection Time: 08/16/19  5:26 AM   Specimen: BLOOD  Result Value Ref Range Status   Specimen Description BLOOD LEFT ANTECUBITAL  Final   Special Requests   Final    BOTTLES DRAWN AEROBIC AND ANAEROBIC Blood Culture adequate volume   Culture   Final    NO GROWTH 3 DAYS Performed at [facility], [place] [address] [place],  [address]    Report Status PENDING  Incomplete         Radiology Studies: EEG  Result Date: 08/18/2019 Hortense Ramal,  Cecil Cranker, MD     08/18/2019  6:10 PM Patient Name: VALERI SULA MRN: [id number] Epilepsy Attending: [name] Referring Physician/Provider: [name], PA Date: 08/18/2019 Duration: 23.49 mins Patient history: 86 year old female who initially presented to the hospital secondary to small bowel obstruction.  While in the hospital patient had an episode of transient unresponsiveness associated with some jerking motions.  EEG evaluate for seizures. Level of alertness: Awake, assleep AEDs during EEG study: Gabapentin Technical aspects: This EEG study was done with scalp electrodes positioned according to the 10-20 International system of electrode placement. Electrical activity was acquired at a sampling rate of 500Hz  and reviewed with a high frequency filter of 70Hz  and a low frequency filter of 1Hz . EEG data were recorded continuously and digitally stored. Description: The posterior dominant rhythm consists of 7 Hz  activity of moderate voltage (25-35 uV) seen predominantly in posterior head regions, symmetric and reactive to eye opening and eye closing. Sleep was characterized by vertex waves, sleep spindles (12 to 14 Hz), maximal frontocentral region. EEG showed continuous generalized 6-7 Hz theta slowing. Hyperventilation and photic stimulation were not performed.   Patient was noted to have face/jaw tremor during the study.  Concomitant EEG before, during and after the event did not show any EEG changes suggest seizure. ABNORMALITY - Continuous slow, generalized - Background slow IMPRESSION: This study is suggestive of mild diffuse encephalopathy, nonspecific etiology.  No seizures or epileptiform discharges were seen throughout the recording.  Patient was noted to have baseline short-term during the study without concomitant EEG change and was not epileptic. [name]   DG Chest 2 View  Result Date: 08/19/2019 CLINICAL DATA:  Weakness, shortness of breath, pleural effusion EXAM: CHEST - 2 VIEW COMPARISON:  08/18/2019 FINDINGS: Similar elevation of the right hemidiaphragm. There are patchy bilateral opacities. Small bilateral pleural effusions with bibasilar atelectasis. No pneumothorax. Stable cardiomediastinal contours. No acute osseous abnormality. IMPRESSION: No substantial change since 08/18/2019. Small bilateral pleural effusions with bibasilar atelectasis. Patchy bilateral opacities. Electronically Signed   By: Macy Mis M.D.   On: 08/19/2019 08:09   CT HEAD WO CONTRAST  Result Date: 08/18/2019 CLINICAL DATA:  Altered mental status EXAM: CT HEAD WITHOUT CONTRAST TECHNIQUE: Contiguous axial images were obtained from the base of the skull through the vertex without intravenous contrast. COMPARISON:  Brain MRI August 24, 2018; head CT March 18, 2014 FINDINGS: Brain: Age related volume loss stable. No intracranial mass, hemorrhage, extra-axial fluid collection, or midline shift. There is patchy small  vessel disease in the centra semiovale bilaterally. No evident acute infarct. Vascular: No hyperdense vessel. There is mild calcification in each carotid siphon region. Skull: Bony calvarium a appears intact. Sinuses/Orbits: There is mucosal thickening in several ethmoid air cells. There is a small retention cyst in the posterior left ethmoid region. Other visualized paranasal sinuses are clear. Visualized orbits appear symmetric bilaterally. Other: Mastoid air cells are clear. IMPRESSION: Age related volume loss with patchy periventricular small vessel disease. No evident acute infarct. No mass or hemorrhage. There are foci of arterial vascular calcification. There is mucosal thickening in several ethmoid air cells. Electronically Signed   By: Lowella Grip III M.D.   On: 08/18/2019 14:22   DG CHEST PORT 1 VIEW  Result Date: 08/18/2019 CLINICAL DATA:  Shortness of breath EXAM: PORTABLE CHEST 1 VIEW COMPARISON:  August 16, 2019 FINDINGS: There is cardiomegaly with a degree of pulmonary venous hypertension. There are pleural effusions bilaterally with apparent areas of  consolidation in the lung bases. There is ill-defined opacity throughout the left mid lung and left base regions. There is aortic atherosclerosis. No adenopathy. Bones are osteoporotic. IMPRESSION: There is cardiomegaly with a degree of pulmonary vascular congestion. There are pleural effusions bilaterally. There may be a degree of congestive heart failure. Areas of airspace opacity in the left mid and lower lung regions could be indicative of pneumonia or aspiration. Consolidation in the bases also may be indicative of pneumonia or aspiration. There may be superimposed pulmonary edema; more than one of these entities may be present concurrently. Electronically Signed   By: Lowella Grip III M.D.   On: 08/18/2019 10:39        Scheduled Meds: . acetaminophen  1,000 mg Oral TID  . amiodarone  100 mg Oral Daily  . amLODipine  2.5 mg  Oral Daily  . apixaban  2.5 mg Oral BID  . artificial tears   Both Eyes QHS  . gabapentin  200 mg Oral QHS   And  . gabapentin  100 mg Oral BID  . lactobacillus acidophilus  2 tablet Oral TID  . lactose free nutrition  237 mL Oral TID WC  . levothyroxine  150 mcg Oral QAC breakfast  . lubiprostone  8 mcg Oral TID WC  . megestrol  400 mg Oral BID  . multivitamin with minerals  1 tablet Oral Daily  . polyethylene glycol  17 g Oral Daily  . polyvinyl alcohol  1 drop Both Eyes QID  . predniSONE  5 mg Oral Daily  . prochlorperazine  5 mg Intravenous Q6H   Continuous Infusions: . ampicillin-sulbactam (UNASYN) IV 3 g (08/19/19 0959)    Assessment & Plan:   Principal Problem:   Small bowel obstruction due to postoperative adhesions Active Problems:   Aspiration pneumonia of both lower lobes due to gastric secretions (HCC)   Chronic diastolic heart failure (HCC)   Chronic kidney disease, stage 3a   Atrial fibrillation, chronic (HCC)   Hyponatremia   Adult failure to thrive   Goals of care, counseling/discussion   Other chronic pain   Nausea   Palliative care by specialist   DNR (do not resuscitate)   Protein-calorie malnutrition, severe   Small bowel obstruction due to postoperative adhesions  Patient suffering a recurrent small bowel obstruction, likely secondary to adhesions Patient is a longstanding known history of recurrent small bowel obstructions with previous hospitalizations in March 2021 and November 2018.During each of these hospitalizations no surgical intervention was pursued. Patient has refused surgical consultation, and NG tube placement-no active vomiting Consulted GI per family's request-input was appreicated- use reglan and compazine General surgery was consulted input was appreciated.  They recommended percutaneous gastrostomy tube via IR for symptomatic relief and reduce her risk of rehospitalization. Trauma surgery okay with her eating and drinking the  foods that she enjoys   Pneumonia of the bilateral lower lobes secondary to gastric secretions   Patchy bilateral lower lobe infiltrates on CT imaging  Continue IV Unasyn  Continue bronchodilators nebs   bronchodilators bronchodilators  Blood cultures NTD, continue to monitor   Hyponatremia   Sodium 126 on admission >>> 127 >>130>>>131>>132  likely secondary to poor oral intake and recent vomiting  Improved with IV fluid however now with vascular congestion, thus IV fluids discontinued  .  Was given Lasix 20 mg IV x1 yesterday and sodium level has improved   Acute/Chronic diastolic heart failure (HCC) Less volume overloaded Chest x-ray with vascular congestion, and todays  chest x-ray with not much change BNP elevated Given lasix yesterday, will give another dose today Monitor creatinine and electrolytes Strict I/o   Chronic kidney disease, stage 3a   Creatinine on admission 1.23, likely prerenal.  Improved to baseline  Continue to monitor   Atrial fibrillation, chronic (HCC)  Temporarily holding home regimen of Eliquis both in case of surgical intervention as well as in preparation for possible NG tube placement to avoid bleeding complications. Patient is typically on amiodarone 200 mg daily  Cards recommended decrease amiodarone to 100 mg daily Will  Hypoalbuminemia -Replaced albumin 25% x1 8/2//21 Continue to monitor  Failure to thrive, malnutrition -Due to poor p.o. intake, multiple comorbidities, and eluding recurrent SBO's, -BMI 18.4, weight 48.6 kg -currently NPO at this time -Consult nutrition for dietary supplements -Per daughters patient has confusion with Marinol So continue with Megace 4 feet because   Nutritional status:  Nutrition Problem: Predicted suboptimal nutrient intake Etiology: acute illness (SBO) Signs/Symptoms: percent weight loss Percent weight loss: 14.3 % Interventions: Ensure Enlive (each supplement  provides 350kcal and 20 grams of protein), Magic cup, MVI    DVT prophylaxis: Eliquis Code Status:DNR Family Communication: Daughter is all updated at bedside Disposition Plan: Home Status is: Inpatient  Remains inpatient appropriate because:Hemodynamically unstable   Dispo: The patient is from: Home              Anticipated d/c is to: Home              Anticipated d/c date is: 2 days              Patient currently is not medically stable to d/c.needs iv lasix. Plan for IR gastrostomy tube            LOS: 3 days   Time spent: 45 minutes with >50% on coc    Nolberto Hanlon, MD [facility] Pager [phone]  If 7PM-7AM, please contact night-coverage [email or web address] Password [email or web address] 08/19/2019, 2:59 PM

## 2019-08-19 NOTE — Consult Note (Signed)
[facility] Surgery Consult Note  [name] 11-30-1920  [id number].    Requesting MD: Nolberto Hanlon, MD  Chief Complaint/Reason for Consult: recurrent SBO  HPI:  [name] is a 86 y/o F with MMP including, but not limited to, diastolic heart failure, A.fib on Eliquis, PMH DVT and PE s/p IVF filter, and recurrent SBO who presented to the ED 08/16/19 with a cc abdominal pain, nausea, and vomiting. In the ED, patient reported severe, sharp abdominal pain that was non-radiating, similar to her last admission for SBO. Patient was admitted 03/2019 for SBO and was seen by our surgical service during this admission. She was discharged home on a soft + FLD. Since march the patient has had intermittent nausea and vomiting at home. Per her daughters when she has an "attack" at home she has high volume emesis and sometimes aspirates. She reports worsening oral intake over the last few weeks. CT abdomen and pelvis performed in the ED confirmed recurrent SBO with a transition zone in the pelvis. It also identified bronchiectasis and interstitial fibrosis of the lung bases. COVID swab was negative. Patient was admitted to the hospital for further management of SBO. She has remained NPO and on bowel rest. Per hospitalist and chart review, initially the patient/family did not want to see a surgical team however this may have been a misunderstanding and they now request to speak to GI/surgery.   During my evaluation today the patients 3 daughters are at bedside assisting with history. The patient currently denies abdominal pain and states her nausea is improved today. She reports minimal flatus and one very small BM yesterday - her daughters nod their head in agreement with this history.   Surgical History: abdominal hysterectomy about 20 years ago, appendectomy (1940)  Social History: lives at home with 24 hour care provided by her daughters and [employer] agency. At baseline she no longer walks, last time she mobilized  with a walker and assistance belt was a couple of months ago.  ROS: Review of Systems  All other systems reviewed and are negative.   Family History  Problem Relation Age of Onset  . Breast cancer Sister   . Congestive Heart Failure Mother   . Stroke Father   . Crohn's disease Neg Hx   . Ulcerative colitis Neg Hx     Past Medical History:  Diagnosis Date  . Blindness   . Blood clot associated with vein wall inflammation   . CKD (chronic kidney disease), stage III   . Colitis   . Goiter    s/p radioactive iod. tx  . Hypothyroidism   . On amiodarone therapy   . Paroxysmal atrial fibrillation (HCC)   . Pulmonary embolus (HCC)    and DVT s/p IVC filter  . SBO (small bowel obstruction) ([place])   . Spinal stenosis of lumbar region with radiculopathy    possible neurogenic claudication recently?  . Temporal arteritis [facility])     Past Surgical History:  Procedure Laterality Date  . APPENDECTOMY  1940  . BACK SURGERY  04/1998   spinal stenosis  . BACK SURGERY  2000   Ruptured Disc  . BREAST BIOPSY     cysts in both breasts  . CARPAL TUNNEL RELEASE  2002  . ivc filter    . PARTIAL HYSTERECTOMY    . TONSILLECTOMY  1934  . TOTAL KNEE ARTHROPLASTY Left 04/1999  . TOTAL KNEE ARTHROPLASTY Right 02/2000    Social History:  reports that she has never  smoked. She has never used smokeless tobacco. She reports that she does not drink alcohol and does not use drugs.  Allergies:  Allergies  Allergen Reactions  . Hydrocortisone Other (See Comments)  . Sulfa Antibiotics Nausea Only  . Cefdinir Other (See Comments)    Caused bad diarrhea Caused bad diarrhea Caused bad diarrhea Caused bad diarrhea Caused bad diarrhea  . Cortisone Other (See Comments)    Flushed hands and face  Flushed hands and face Flushed hands and face  Flushed hands and face Flushed hands and face  . Prednisone Other (See Comments)    cuased blood clots Can have very small dose cuased blood  clots cuased blood clots Can have very small dose cuased blood clots cuased blood clots  . Sulfasalazine Other (See Comments)    Nausea side effects Stomach pains and cramping Nausea side effects Stomach pains and cramping Stomach pains and cramping  . Sulfonamide Derivatives     Stomach pains and cramping     Medications Prior to Admission  Medication Sig Dispense Refill  . acetaminophen (TYLENOL) 500 MG tablet Take 500-1,000 mg by mouth See admin instructions. Take 500mg  in the morning, 500mg  midday and 1000mg  at bedtime.    Marland Kitchen amiodarone (PACERONE) 200 MG tablet Take 1 tablet (200 mg total) by mouth daily. 90 tablet 3  . ASPERCREME LIDOCAINE EX Apply 1 application topically at bedtime. Left back rib    . Calcium Carbonate-Vitamin D (CALCIUM HIGH POTENCY/VITAMIN D) 600-200 MG-UNIT TABS Take 1 tablet by mouth daily.     Marland Kitchen CRANBERRY EXTRACT PO Take 2 tablets by mouth 2 (two) times daily.     Marland Kitchen dronabinol (MARINOL) 2.5 MG capsule Take 2.5 mg by mouth 2 (two) times daily.     Marland Kitchen ELIQUIS 2.5 MG TABS tablet TAKE 1 TABLET BY MOUTH TWICE DAILY. 60 tablet 5  . gabapentin (NEURONTIN) 100 MG capsule Take 100-200 mg by mouth See admin instructions. Take 1 capsule by mouth in the morning, 1 capsule at 3:30p and 2 capsules at bedtime    . levothyroxine (SYNTHROID, LEVOTHROID) 150 MCG tablet Take 150 mcg by mouth daily before breakfast. BRAND NAME ONLY (Synthroid)    . lubiprostone (AMITIZA) 8 MCG capsule Take 8 mcg by mouth 3 (three) times daily with meals.     . Misc Natural Products (GLUCOSAMINE CHONDROITIN COMPLX) TABS Take 1 tablet by mouth 2 (two) times daily.     . Multiple Vitamins-Minerals (OCUVITE ADULT 50+ PO) Take 1 tablet by mouth daily.    . nitrofurantoin (MACRODANTIN) 50 MG capsule Take 50 mg by mouth daily at 12 noon.     . nitroGLYCERIN (NITROSTAT) 0.4 MG SL tablet Place 1 tablet (0.4 mg total) under the tongue every 5 (five) minutes as needed for chest pain. 25 tablet 3  .  predniSONE (DELTASONE) 5 MG tablet Take 5 mg by mouth daily.     . Probiotic Product (ULTRAFLORA IMMUNE HEALTH PO) Take 1 tablet by mouth daily.     . traMADol (ULTRAM) 50 MG tablet Take 50 mg by mouth at bedtime.       Blood pressure (!) 169/74, pulse 69, temperature 97.8 F (36.6 C), temperature source Oral, resp. rate 17, SpO2 100 %. Physical Exam: Constitutional: NAD; conversant; hard of hearing, has a resting tremor, appears chronically ill. Eyes: Moist conjunctiva; no lid lag; anicteric; PERRL Neck: Trachea midline; no thyromegaly Lungs: Normal respiratory effort; crackles lung bases bilaterally CV: RRR; no palpable thrills; no pitting edema GI: Abd  soft, distended and tympanic, mild RLQ tenderness to palpation, no palpable hepatosplenomegaly, + high pitched bowel sounds MSK: symmetrical; no clubbing/cyanosis Psychiatric: Appropriate affect; alert and oriented x3 Lymphatic: No palpable cervical or axillary lymphadenopathy  Results for orders placed or performed during the hospital encounter of 08/16/19 (from the past 48 hour(s))  Basic metabolic panel     Status: Abnormal   Collection Time: 08/18/19  9:26 AM  Result Value Ref Range   Sodium 131 (L) 135 - 145 mmol/L   Potassium 3.6 3.5 - 5.1 mmol/L   Chloride 99 98 - 111 mmol/L   CO2 25 22 - 32 mmol/L   Glucose, Bld 134 (H) 70 - 99 mg/dL    Comment: Glucose reference range applies only to samples taken after fasting for at least 8 hours.   BUN 8 8 - 23 mg/dL   Creatinine, Ser 0.75 0.44 - 1.00 mg/dL   Calcium 8.4 (L) 8.9 - 10.3 mg/dL   GFR calc non Af Amer >60 >60 mL/min   GFR calc Af Amer >60 >60 mL/min   Anion gap 7 5 - 15    Comment: Performed at [facility] [address], [place], [place] [address]  Troponin I (High Sensitivity)     Status: Abnormal   Collection Time: 08/18/19  1:39 PM  Result Value Ref Range   Troponin I (High Sensitivity) 83 (H) <18 ng/L    Comment: (NOTE) Elevated high sensitivity troponin  I (hsTnI) values and significant  changes across serial measurements may suggest ACS but many other  chronic and acute conditions are known to elevate hsTnI results.  Refer to the "Links" section for chest pain algorithms and additional  guidance. Performed at [facility], [place] [address], [place], [place] [address]   Troponin I (High Sensitivity)     Status: Abnormal   Collection Time: 08/18/19  3:44 PM  Result Value Ref Range   Troponin I (High Sensitivity) 62 (H) <18 ng/L    Comment: (NOTE) Elevated high sensitivity troponin I (hsTnI) values and significant  changes across serial measurements may suggest ACS but many other  chronic and acute conditions are known to elevate hsTnI results.  Refer to the Links section for chest pain algorithms and additional  guidance. Performed at [facility], [place] [address], [place], [place] [address]   Brain natriuretic peptide     Status: Abnormal   Collection Time: 08/18/19  3:44 PM  Result Value Ref Range   B Natriuretic Peptide 811.0 (H) 0.0 - 100.0 pg/mL    Comment: Performed at [facility] [address] [place], [place] [address]  Basic metabolic panel     Status: Abnormal   Collection Time: 08/19/19  4:47 AM  Result Value Ref Range   Sodium 132 (L) 135 - 145 mmol/L   Potassium 3.9 3.5 - 5.1 mmol/L   Chloride 95 (L) 98 - 111 mmol/L   CO2 26 22 - 32 mmol/L   Glucose, Bld 96 70 - 99 mg/dL    Comment: Glucose reference range applies only to samples taken after fasting for at least 8 hours.   BUN 7 (L) 8 - 23 mg/dL   Creatinine, Ser 0.67 0.44 - 1.00 mg/dL   Calcium 8.6 (L) 8.9 - 10.3 mg/dL   GFR calc non Af Amer >60 >60 mL/min   GFR calc Af Amer >60 >60 mL/min   Anion gap 11 5 - 15    Comment: Performed  at [facility], [place] [address], [place], [place] [address]   EEG  Result Date: 08/18/2019 [name], MD     08/18/2019  6:10 PM Patient Name: [name] MRN: [id number] Epilepsy Attending: [name] Referring Physician/Provider: [name], PA Date: 08/18/2019 Duration: 23.49 mins Patient history: 86 year old female who initially presented to the hospital secondary to small bowel obstruction.  While in the hospital patient had an episode of transient unresponsiveness associated with some jerking motions.  EEG evaluate for seizures. Level of alertness: Awake, assleep AEDs during EEG study: Gabapentin Technical aspects: This EEG study was done with scalp electrodes positioned according to the 10-20 International system of electrode placement. Electrical activity was acquired at a sampling rate of 500Hz  and reviewed with a high frequency filter of 70Hz  and a low frequency filter of 1Hz . EEG data were recorded continuously and digitally stored. Description: The posterior dominant rhythm consists of 7 Hz activity of moderate voltage (25-35 uV) seen predominantly in posterior head regions, symmetric and reactive to eye opening and eye closing. Sleep was characterized by vertex waves, sleep spindles (12 to 14 Hz), maximal frontocentral region. EEG showed continuous generalized 6-7 Hz theta slowing. Hyperventilation and photic stimulation were not performed.   Patient was noted to have face/jaw tremor during the study.  Concomitant EEG before, during and after the event did not show any EEG changes suggest seizure. ABNORMALITY - Continuous slow, generalized - Background slow IMPRESSION: This study is suggestive of mild diffuse encephalopathy, nonspecific etiology.  No seizures or epileptiform discharges were seen throughout the recording.  Patient was noted to have baseline short-term during the study without concomitant EEG change and was not epileptic. [name]   DG Chest 2 View  Result Date: 08/19/2019 CLINICAL DATA:  Weakness, shortness of breath, pleural effusion EXAM: CHEST - 2 VIEW COMPARISON:  08/18/2019 FINDINGS: Similar elevation of the right hemidiaphragm. There are patchy bilateral  opacities. Small bilateral pleural effusions with bibasilar atelectasis. No pneumothorax. Stable cardiomediastinal contours. No acute osseous abnormality. IMPRESSION: No substantial change since 08/18/2019. Small bilateral pleural effusions with bibasilar atelectasis. Patchy bilateral opacities. Electronically Signed   By: Macy Mis M.D.   On: 08/19/2019 08:09   CT HEAD WO CONTRAST  Result Date: 08/18/2019 CLINICAL DATA:  Altered mental status EXAM: CT HEAD WITHOUT CONTRAST TECHNIQUE: Contiguous axial images were obtained from the base of the skull through the vertex without intravenous contrast. COMPARISON:  Brain MRI August 24, 2018; head CT March 18, 2014 FINDINGS: Brain: Age related volume loss stable. No intracranial mass, hemorrhage, extra-axial fluid collection, or midline shift. There is patchy small vessel disease in the centra semiovale bilaterally. No evident acute infarct. Vascular: No hyperdense vessel. There is mild calcification in each carotid siphon region. Skull: Bony calvarium a appears intact. Sinuses/Orbits: There is mucosal thickening in several ethmoid air cells. There is a small retention cyst in the posterior left ethmoid region. Other visualized paranasal sinuses are clear. Visualized orbits appear symmetric bilaterally. Other: Mastoid air cells are clear. IMPRESSION: Age related volume loss with patchy periventricular small vessel disease. No evident acute infarct. No mass or hemorrhage. There are foci of arterial vascular calcification. There is mucosal thickening in several ethmoid air cells. Electronically Signed   By: Lowella Grip III M.D.   On: 08/18/2019 14:22   DG CHEST PORT 1 VIEW  Result Date: 08/18/2019 CLINICAL DATA:  Shortness of breath EXAM: PORTABLE CHEST 1 VIEW COMPARISON:  August 16, 2019  FINDINGS: There is cardiomegaly with a degree of pulmonary venous hypertension. There are pleural effusions bilaterally with apparent areas of consolidation in the lung  bases. There is ill-defined opacity throughout the left mid lung and left base regions. There is aortic atherosclerosis. No adenopathy. Bones are osteoporotic. IMPRESSION: There is cardiomegaly with a degree of pulmonary vascular congestion. There are pleural effusions bilaterally. There may be a degree of congestive heart failure. Areas of airspace opacity in the left mid and lower lung regions could be indicative of pneumonia or aspiration. Consolidation in the bases also may be indicative of pneumonia or aspiration. There may be superimposed pulmonary edema; more than one of these entities may be present concurrently. Electronically Signed   By: Lowella Grip III M.D.   On: 08/18/2019 10:39   Assessment/Plan Paroxysmal a.fib PMH DVT and PE w/ IVC filter in place, Eliquis on hold  Chronic diastolic heart failure CKD III hypothyroidism Blindness   pSBO, recurrent  [name] is a very pleasant 86 y/o female with recurrent, high-grade pSBO and multiple co-morbdities listed above. Clinically she remains partially obstructed with minimal bowel function, nausea, and abdominal distention. I do not recommend surgical intervention for this issue - the patient is at increased for perioperative complications given age, debility, and above medical issues. The patient and family agree with this. I do however think that given increasing frequency and severity of her pSBO episodes, a venting gastrostomy tube is worth considering. It would likely provide her symptomatic relief and reduce her risk of re-hospitalization. It would also allow her the comfort of eating/drinking the foods she enjoys. Appreciate palliative care seeing the patient and contributing to this discussion.    As the patient does not want surgery, [facility] will sign off. Please call as needed with questions/concerns.  Jill Alexanders, PA-C [facility] Please see Amion for pager number during day hours 7:00am-4:30pm 08/19/2019,  10:35 AM

## 2019-08-19 NOTE — Progress Notes (Signed)
[name] 86 y.o. September 21, 1920  CC:  SBO  Subjective: Patient reports shortness of breath but denies nausea, vomiting, or abdominal pain today.  Had a small brown bowel movement this morning.  ROS : Review of Systems  Respiratory: Positive for shortness of breath. Negative for cough.   Gastrointestinal: Negative for abdominal pain, blood in stool, constipation, diarrhea, heartburn, melena, nausea and vomiting.   Objective: Vital signs in last 24 hours: Vitals:   08/18/19 2136 08/19/19 0412  BP: (!) 187/76 (!) 169/74  Pulse: 74 69  Resp: 20 17  Temp: 97.8 F (36.6 C) 97.8 F (36.6 C)  SpO2: 99% 100%    Physical Exam:  General:  Elderly, lethargic, oriented, cooperative, no acute distress  Head:  Normocephalic, without obvious abnormality, atraumatic  Eyes:  Anicteric sclera, EOMs intact  Lungs:   Crackles bilaterally; no respiratory distress  Heart:  Regular rate and rhythm, S1, S2 normal  Abdomen:   Soft, mildly distended, non-tender, bowel sounds sluggish but present,  no guarding or peritoneal signs  Extremities: Extremities normal, atraumatic, no  edema  Pulses: 2+ and symmetric    Lab Results: Recent Labs    08/17/19 0343 08/17/19 0343 08/18/19 0926 08/19/19 0447  NA 130*   < > 131* 132*  K 4.2   < > 3.6 3.9  CL 95*   < > 99 95*  CO2 28   < > 25 26  GLUCOSE 94   < > 134* 96  BUN 15   < > 8 7*  CREATININE 0.99   < > 0.75 0.67  CALCIUM 8.2*   < > 8.4* 8.6*  MG 2.0  --   --   --    < > = values in this interval not displayed.   Recent Labs    08/17/19 0343  AST 20  ALT 15  ALKPHOS 50  BILITOT 0.8  PROT 4.7*  ALBUMIN 2.5*   Recent Labs    08/17/19 0343  WBC 8.7  NEUTROABS 7.1  HGB 10.0*  HCT 30.2*  MCV 98.1  PLT 258   No results for input(s): LABPROT, INR in the last 72 hours.   Assessment/Plan: Persistent SBO Continue scheduled Compazine 5 mg every 6h.  An additional 5 mg can be used every 6h as  needed. Avoid Zofran and Phenergan due to QT prolongation.  Discussed with surgical PA, Obie Dredge.  OK to have sips/chips.  Surgical team considering venting G-tube via IR.  No further recommendations from a GI standpoint.  [facility] will sign off. Please contact us if we can be of further assistance during this hospital stay.  Salley Slaughter PA-C 08/19/2019, 12:22 PM  Contact #  [phone]

## 2019-08-19 NOTE — Progress Notes (Signed)
PT TREATMENT NOTE (Late entry)  Pt requiring min assist for transfers and limited ambulation using a walker. Presents with weakness, balance deficits, and decreased endurance. Recommending HHPT at d/c to maximize functional mobility and decrease caregiver burden.     Wyona Almas, PT, DPT Acute Rehabilitation Services Pager [phone] Office [phone]   08/18/19 [id number]  PT Visit Information  Last PT Received On 08/18/19  Assistance Needed +1  History of Present Illness Pt is a 86 y.o. F with significant PMH of CKD stage III, atrial fibrillation, prior DVT s/p IVC filter, diastolic congestive heart failure, and multiple small bowel obstructions who presents with complaints of abdominal pain, nausea, vomiting. CT imaging revealed evidence of SBO with distal transition zone likely secondary to adhesions. Additionally, pt with PNA of bilateral lower lobes secondary to gastric secretions.  Precautions  Precautions Fall  Restrictions  Weight Bearing Restrictions No  Home Living  Family/patient expects to be discharged to: Private residence  Living Arrangements Children  Available Help at Discharge Family;Available 24 hours/day;Personal care attendant  Type of [place] to enter  Entrance Stairs-Number of Steps 1  [place] to live on main level with bedroom/bathroom  Bathroom Shower/Tub Tub/shower unit  Corporate treasurer Yes  Home Equipment BSC;Walker - 4 wheels;Shower seat;Grab bars - tub/shower;Toilet riser;Wheelchair - manual  Prior Function  Level of [place] Needs assistance  Gait / Transfers Assistance Needed Pt ambulates short distances ~3 feet, transfers with assist, uses w/c otherwise   ADL's / [place] requires assist for ADL's, self feeds  Communication  Communication HOH (Lt ear better than Rt)  Pain Assessment  Pain Assessment Faces  Faces Pain Scale 8  Pain Location back  Pain  Descriptors / Indicators Grimacing;Guarding  Pain Intervention(s) Limited activity within patient's tolerance;Monitored during session;Patient requesting pain meds-RN notified;Heat applied  Cognition  Arousal/Alertness Awake/alert  Behavior During Therapy WFL for tasks assessed/performed  Overall Cognitive Status Difficult to assess  General Comments Seems to be at baseline cognition, follows 1 step commands  Difficult to assess due to Hard of hearing/deaf  Upper Extremity Assessment  Upper Extremity Assessment Generalized weakness  Lower Extremity Assessment  Lower Extremity Assessment Generalized weakness  Cervical / Trunk Assessment  Cervical / Trunk Assessment Kyphotic  Bed Mobility  Overal bed mobility Needs Assistance  Bed Mobility Sit to Supine;Rolling  Rolling Mod assist  Sit to supine Mod assist  General bed mobility comments ModA for BLE assist back into bed. Rolling to R with modA for positioning  Transfers  Overall transfer level Needs assistance  Equipment used Rolling walker (2 wheeled)  Transfers Sit to/from Stand  Sit to Stand Min assist;+2 safety/equipment  General transfer comment MinA to rise to stand from recliner and BSC. Hand over hand guidance provided to transition to walker  Ambulation/Gait  Ambulation/Gait assistance Min assist;+2 safety/equipment  Gait Distance (Feet) 3 Feet  Assistive device Rolling walker (2 wheeled)  Gait Pattern/deviations Step-through pattern;Decreased stride length;Trunk flexed  General Gait Details Transitional steps from recliner to [facility] and BSC to bed. Auditory/verbal cues utilized for sequencing/direction  Gait velocity decreased  Gait velocity interpretation <1.31 ft/sec, indicative of household ambulator  Balance  Overall balance assessment Needs assistance  Sitting-balance support Feet supported  Sitting balance-Leahy Scale Fair  Standing balance support Bilateral upper extremity supported  Standing balance-Leahy Scale  Poor  PT - End of Session  Equipment Utilized During Treatment Gait belt;Oxygen  Activity Tolerance Patient limited by  pain  Patient left in bed;with call bell/phone within reach;with family/visitor present  Nurse Communication Mobility status;Patient requests pain meds  PT Assessment  PT Recommendation/Assessment Patient needs continued PT services  PT Visit Diagnosis Unsteadiness on feet (R26.81);Other abnormalities of gait and mobility (R26.89);Muscle weakness (generalized) (M62.81);Difficulty in walking, not elsewhere classified (R26.2);Pain  Pain - part of body  (back)  PT Problem List Decreased strength;Decreased activity tolerance;Decreased balance;Decreased mobility;Pain  PT Plan  PT Frequency (ACUTE ONLY) Min 3X/week  PT Treatment/Interventions (ACUTE ONLY) DME instruction;Gait training;Functional mobility training;Therapeutic activities;Therapeutic exercise;Balance training;Patient/family education;Wheelchair mobility training  AM-PAC PT "6 Clicks" Mobility Outcome Measure (Version 2)  Help needed turning from your back to your side while in a flat bed without using bedrails? 2  Help needed moving from lying on your back to sitting on the side of a flat bed without using bedrails? 2  Help needed moving to and from a bed to a chair (including a wheelchair)? 3  Help needed standing up from a chair using your arms (e.g., wheelchair or bedside chair)? 3  Help needed to walk in hospital room? 3  Help needed climbing 3-5 steps with a railing?  2  6 Click Score 15  Consider Recommendation of Discharge To: CIR/SNF/LTACH  PT Recommendation  Follow Up Recommendations Home health PT;Supervision/Assistance - 24 hour  PT equipment None recommended by PT  Individuals Consulted  Consulted and Agree with Results and Recommendations Patient;Family member/caregiver  Family Member Consulted pt daughter  Acute Rehab PT Goals  Patient Stated Goal pt daughter would like her to return home,  interested in palliative services  PT Goal Formulation With patient/family  Time For Goal Achievement 09/01/19  Potential to Achieve Goals Fair  PT Time Calculation  PT Start Time (ACUTE ONLY) 0813  PT Stop Time (ACUTE ONLY) 0853  PT Time Calculation (min) (ACUTE ONLY) 40 min  PT General Charges  $$ ACUTE PT VISIT 1 Visit  PT Evaluation  $PT Eval Moderate Complexity 1 Mod  PT Treatments  $Therapeutic Activity 23-37 mins  Written Expression  Dominant Hand Right

## 2019-08-19 NOTE — Progress Notes (Signed)
Daily Progress Note   Patient Name: [name]       Date: 08/19/2019 DOB: Mar 10, 1920  Age: 86 y.o. MRN#: [id number] Attending Physician: Nolberto Hanlon, MD Primary Care Physician: Antony Contras, MD Admit Date: 08/16/2019  Reason for Consultation/Follow-up: Establishing goals of care  Subjective: Feels short of breath. Back hurts. Some nausea.   Length of Stay: 3  Current Medications: Scheduled Meds:  . acetaminophen  1,000 mg Oral TID  . amiodarone  100 mg Oral Daily  . apixaban  2.5 mg Oral BID  . artificial tears   Both Eyes QHS  . gabapentin  200 mg Oral QHS   And  . gabapentin  100 mg Oral BID  . lactobacillus acidophilus  2 tablet Oral TID  . lactose free nutrition  237 mL Oral TID WC  . levothyroxine  150 mcg Oral QAC breakfast  . lubiprostone  8 mcg Oral TID WC  . megestrol  400 mg Oral BID  . multivitamin with minerals  1 tablet Oral Daily  . polyethylene glycol  17 g Oral Daily  . polyvinyl alcohol  1 drop Both Eyes QID  . predniSONE  5 mg Oral Daily  . prochlorperazine  5 mg Intravenous Q6H    Continuous Infusions: . ampicillin-sulbactam (UNASYN) IV 3 g (08/19/19 0959)    PRN Meds: acetaminophen **OR** acetaminophen, ipratropium-albuterol, nitroGLYCERIN, prochlorperazine  Physical Exam Constitutional:      General: She is not in acute distress.    Comments: Responds to verbal stimulation  Pulmonary:     Comments: Mild tachypnea Skin:    General: Skin is warm and dry.  Neurological:     Mental Status: She is oriented to person, place, and time.     Comments: Intermittent confusion             Vital Signs: BP (!) 169/74 (BP Location: Left Arm)   Pulse 69   Temp 97.8 F (36.6 C) (Oral)   Resp 17   SpO2 100%  SpO2: SpO2: 100 % O2 Device: O2 Device: Nasal  Cannula O2 Flow Rate: O2 Flow Rate (L/min): 2 L/min  Intake/output summary:   Intake/Output Summary (Last 24 hours) at 08/19/2019 1046 Last data filed at 08/18/2019 1843 Gross per 24 hour  Intake 120 ml  Output 550 ml  Net -430 ml   LBM: Last BM Date: 08/18/19 Baseline Weight:   Most recent weight:         Palliative Assessment/Data: PPS 40%     Flowsheet Rows     Most Recent Value  Intake Tab  Referral Department Hospitalist  Unit at Time of Referral Cardiac/Telemetry Unit  Palliative Care Primary Diagnosis Cardiac  Date Notified 08/16/19  Palliative Care Type New Palliative care  Reason for referral Clarify Goals of Care  Date of Admission 08/16/19  Date first seen by Palliative Care 08/18/19  # of days Palliative referral response time 2 Day(s)  # of days IP prior to Palliative referral 0  Clinical Assessment  Palliative Performance Scale Score 40%  Psychosocial & Spiritual Assessment  Palliative Care Outcomes  Patient/Family meeting held? Yes  Who was at the meeting? daughters  Palliative Care Outcomes Improved pain interventions, Improved  non-pain symptom therapy, Clarified goals of care, Counseled regarding hospice, Provided end of life care assistance, Provided psychosocial or spiritual support, Transitioned to hospice      Patient Active Problem List   Diagnosis Date Noted  . Protein-calorie malnutrition, severe 08/18/2019  . Adult failure to thrive   . Goals of care, counseling/discussion   . Other chronic pain   . Nausea   . Palliative care by specialist   . DNR (do not resuscitate)   . Small bowel obstruction due to postoperative adhesions 08/16/2019  . Hyponatremia 08/16/2019  . Atrial fibrillation, chronic ([place]) 03/15/2019  . Small bowel obstruction ([place]) 03/14/2019  . UTI (urinary tract infection) 09/13/2018  . Hypotension   . Anticoagulated on warfarin 11/23/2016  . Encounter for monitoring amiodarone therapy 07/07/2016  . Chronic kidney  disease, stage 3a 11/28/2015  . Mild anemia 11/28/2015  . Chronic diastolic heart failure ([place]) 12/29/2013  . SBO (small bowel obstruction) ([place]) 01/17/2013  . Microscopic colitis 01/17/2013  . Leukocytosis 01/17/2013  . Aspiration pneumonia of both lower lobes due to gastric secretions ([place]) 01/17/2013  . CKD (chronic kidney disease), stage IV ([place])   . SYNCOPE 09/21/2008  . Disorder of thyroid 09/17/2008  . TEMPORAL ARTERITIS 09/17/2008  . Arthropathy 09/17/2008  . DYSPNEA ON EXERTION 09/17/2008    Palliative Care Assessment & Plan   HPI: 86 y.o. female  with past medical history of CKD, a fib, prior DVT s/p  IVC filter, CHF, hypothyroidism, and multiple SBO admitted on 08/16/2019 with SBO d/t post-op adhesions. Now tolerating soft diet.  Abd xray improving. Patient is also with pneumonia on IV antibiotics. PMT consulted for [place].  Assessment: Follow up with patient and family today.   Symptoms remain uncontrolled: Nausea, back pain, and shortness of breath. Family not interested in low dose opioids for back pain or shortness of breath. They prioritize her mental status and now willing to risk increased drowsiness with low dose opioids. GI d/c'd reglan, have scheduled compazine. Patient continues to c/o nausea.   Family remain interested in full medical work-up - asking to speak with surgery and pulmonology. Surgery has been consulted.   They do remain interested in going home with hospice support. They are concerned about recurrent bowel obstructions and how to manage at home. They ask about alternative interventions. ? Consider if venting gastrostomy would be helpful?   Recommendations/Plan:  Family remain interested in full medical work-up requesting surgery and pulmonology consult   Symptoms unmanaged, family not interested in trial of low dose opioids d/t risk of drowsiness  Family very concerned about reoccurrence of bowel obstruction on discharge home with hospice - ? If  venting gastrostomy is an option?  When stable for discharge will go home with hospice  Code Status:  DNR  Prognosis:   Unable to determine  Discharge Planning:  Home with Hospice  Care plan was discussed with Dr. Kurtis Bushman and RN  Thank you for allowing the Palliative Medicine Team to assist in the care of this patient.   Total Time 40 minutes Prolonged Time Billed  no       Greater than 50%  of this time was spent counseling and coordinating care related to the above assessment and plan.  Juel Burrow, DNP, [facility] Palliative Medicine Team Team Phone # [phone]  Pager [phone]

## 2019-08-19 NOTE — Progress Notes (Addendum)
**  Addendum:  Spoke with daughter Barbaraann Share who declines any DME at this time, requests opportunity to discuss having a hospital bed at home with pt before ordering.  Plan made to have follow up discussion tomorrow morning.**  Manufacturing engineer (ACC)  Received request from [facility] for hospice services at home after discharge.  Chart and pt information under review by [facility] physician.  Hospice eligibility pending at this time.  Hospital liaison left a message for Barbaraann Share, pt's daughter, to initiate education related to hospice philosophy and services and to answer any questions at this time.   DME needs discussed with TOC Heather who reports OT recommendation of hospital bed, BSC with drop arm and a potty chair on wheels.  ACC looking into availability of these items at this time. Address has been verified and is correct in the chart.  Above information shared with [place] Manager.  Please call with any questions or concerns.  Thank you for the opportunity to participate in this pt's care.  Domenic Moras, BSN, RN [employer] [phone] (24h on call)

## 2019-08-19 NOTE — Discharge Instructions (Addendum)
Information on my medicine - ELIQUIS (apixaban)  Why was Eliquis prescribed for you? Eliquis was prescribed for you to reduce the risk of a blood clot forming that can cause a stroke if you have a medical condition called atrial fibrillation (a type of irregular heartbeat).  What do You need to know about Eliquis ? Take your Eliquis TWICE DAILY - one tablet in the morning and one tablet in the evening with or without food. If you have difficulty swallowing the tablet whole please discuss with your pharmacist how to take the medication safely.  Take Eliquis exactly as prescribed by your doctor and DO NOT stop taking Eliquis without talking to the doctor who prescribed the medication.  Stopping may increase your risk of developing a stroke.  Refill your prescription before you run out.  After discharge, you should have regular check-up appointments with your healthcare provider that is prescribing your Eliquis.  In the future your dose may need to be changed if your kidney function or weight changes by a significant amount or as you get older.  What do you do if you miss a dose? If you miss a dose, take it as soon as you remember on the same day and resume taking twice daily.  Do not take more than one dose of ELIQUIS at the same time to make up a missed dose.  Important Safety Information A possible side effect of Eliquis is bleeding. You should call your healthcare provider right away if you experience any of the following: ? Bleeding from an injury or your nose that does not stop. ? Unusual colored urine (red or dark brown) or unusual colored stools (red or black). ? Unusual bruising for unknown reasons. ? A serious fall or if you hit your head (even if there is no bleeding).  Some medicines may interact with Eliquis and might increase your risk of bleeding or clotting while on Eliquis. To help avoid this, consult your healthcare provider or pharmacist prior to using any new  prescription or non-prescription medications, including herbals, vitamins, non-steroidal anti-inflammatory drugs (NSAIDs) and supplements.  This website has more information on Eliquis (apixaban): http://www.eliquis.com/eliquis/home   Gastrostomy Tube Home Guide, Adult A gastrostomy tube, or G-tube, is a tube that is inserted through the abdomen into the stomach. The tube is used for venting/decompressive purposes when patient experiences symptoms from SBO. How to care for a G-tube Supplies needed  Saline solution or clean, warm water and soap.  Cotton swab or gauze. Instructions 1. Wash your hands with soap and water. 2. If there is a dressing between the person's skin and the tube, remove it. 3. Check the area where the tube enters the skin. Check for problems such as: ? Redness. ? Swelling. ? Pus-like drainage. ? Extra skin growth. 4. Moisten the cotton swab with the saline solution or soap and water mixture. Gently clean around the insertion site. Remove any drainage or crusting. ? Mild soap and warm water can be used. G-tube problems and solutions  If the tube comes out: ? Cover the opening with a clean dressing and tape. ? Call a health care provider right away (same day).  If there is skin or scar tissue growing where the tube enters the skin: ? Keep the area clean and dry. ? Secure the tube with tape so that the tube does not move around too much. ? Call a health care provider. Follow these instructions at home: General instructions  Do not pull or put tension  on the tube.  Remove excess air from the G-tube as told by the person's health care provider. This is called "venting"- do this when patient develops symptoms from SBO.  Keep the area where the tube enters the skin clean and dry. No submerging (bathing, swimming) or showering with tube in place. Ok to sponge bath. Get help right away if:  The person with the tube has any of these problems: ? Severe abdominal  pain. ? Severe tenderness. ? Severe bloating. ? Nausea. ? Vomiting. ? Trouble breathing. ? Shortness of breath.  Any of these problems happen in the area where the tube enters the skin: ? Redness, irritation, swelling, or soreness. ? Pus-like discharge. ? A bad smell.  The tube comes out.  This information is not intended to replace advice given to you by your health care provider. Make sure you discuss any questions you have with your health care provider. Document Revised: 12/13/2016 Document Reviewed: 02/26/2016 Elsevier Patient Education  2020 [employer].

## 2019-08-19 NOTE — Plan of Care (Signed)
  Problem: Education: Goal: Knowledge of General Education information will improve Description Including pain rating scale, medication(s)/side effects and non-pharmacologic comfort measures Outcome: Progressing   Problem: Coping: Goal: Level of anxiety will decrease Outcome: Progressing   Problem: Elimination: Goal: Will not experience complications related to bowel motility Outcome: Progressing Goal: Will not experience complications related to urinary retention Outcome: Progressing   Problem: Safety: Goal: Ability to remain free from injury will improve Outcome: Progressing   

## 2019-08-20 DIAGNOSIS — R400 Somnolence: Secondary | ICD-10-CM

## 2019-08-20 DIAGNOSIS — J90 Pleural effusion, not elsewhere classified: Secondary | ICD-10-CM

## 2019-08-20 LAB — CBC
HCT: 31.3 % — ABNORMAL LOW (ref 36.0–46.0)
Hemoglobin: 10.3 g/dL — ABNORMAL LOW (ref 12.0–15.0)
MCH: 32 pg (ref 26.0–34.0)
MCHC: 32.9 g/dL (ref 30.0–36.0)
MCV: 97.2 fL (ref 80.0–100.0)
Platelets: 267 10*3/uL (ref 150–400)
RBC: 3.22 MIL/uL — ABNORMAL LOW (ref 3.87–5.11)
RDW: 12.6 % (ref 11.5–15.5)
WBC: 10 10*3/uL (ref 4.0–10.5)
nRBC: 0 % (ref 0.0–0.2)

## 2019-08-20 LAB — BASIC METABOLIC PANEL
Anion gap: 12 (ref 5–15)
BUN: 8 mg/dL (ref 8–23)
CO2: 27 mmol/L (ref 22–32)
Calcium: 8.2 mg/dL — ABNORMAL LOW (ref 8.9–10.3)
Chloride: 94 mmol/L — ABNORMAL LOW (ref 98–111)
Creatinine, Ser: 0.83 mg/dL (ref 0.44–1.00)
GFR calc Af Amer: >60 mL/min (ref >60)
GFR calc non Af Amer: 59 mL/min — ABNORMAL LOW (ref >60)
Glucose, Bld: 84 mg/dL (ref 70–99)
Potassium: 3.6 mmol/L (ref 3.5–5.1)
Sodium: 133 mmol/L — ABNORMAL LOW (ref 135–145)

## 2019-08-20 LAB — GLUCOSE, CAPILLARY: Glucose-Capillary: 70 mg/dL (ref 70–99)

## 2019-08-20 MED ORDER — ENOXAPARIN SODIUM 40 MG/0.4ML ~~LOC~~ SOLN
40.0000 mg | SUBCUTANEOUS | Status: DC
  Administered 2019-08-20 – 2019-08-21 (×2): 40 mg via SUBCUTANEOUS
  Filled 2019-08-20 (×3): qty 0.4

## 2019-08-20 MED ORDER — CEFAZOLIN SODIUM-DEXTROSE 1-4 GM/50ML-% IV SOLN
1.0000 g | INTRAVENOUS | Status: DC
  Filled 2019-08-20: qty 50

## 2019-08-20 NOTE — Progress Notes (Signed)
Manufacturing engineer [facility])  Hospital liaison spoke with daughter Barbaraann Share to continue education related to hospice philosophy and services and to answer any questions at this time.  Barbaraann Share shared on going concerns about care once pt is home, especially around toileting and transfers. Discussion had of interventions available in the home with hospice support. Barbaraann Share also shared concerns that pt perseverates which provokes anxiety and asks that the word "Hospice" not be used with pt.  This will be included in the plan of care.    ACC information and contact numbers given to [facility].  Above information shared with Mahala Menghini Manager.  Please call with any questions or concerns.  Thank you for the opportunity to participate in this pt's care.  Domenic Moras, BSN, RN [employer] [phone] (24h on call)

## 2019-08-20 NOTE — Social Work (Signed)
CSW has f/u with [place] to ensure all is arranged before weekend so that when pt is ready we can get her safely transitioned home.   Westley Hummer, MSW, [place] Work

## 2019-08-20 NOTE — Progress Notes (Addendum)
Progress Note  Patient Name: [name] Date of Encounter: 08/20/2019  Primary Cardiologist: Brittany Grooms, MD  Subjective   Family concerned about patient being more lethargic this morning. Gabapentin is being held. Patient denies any complaints of chest pain or dyspnea.   Inpatient Medications    Scheduled Meds: . acetaminophen  1,000 mg Oral TID  . amiodarone  100 mg Oral Daily  . amLODipine  2.5 mg Oral Daily  . apixaban  2.5 mg Oral BID  . artificial tears   Both Eyes QHS  . gabapentin  200 mg Oral QHS   And  . gabapentin  100 mg Oral BID  . lactobacillus acidophilus  2 tablet Oral TID  . lactose free nutrition  237 mL Oral TID WC  . levothyroxine  150 mcg Oral QAC breakfast  . lubiprostone  8 mcg Oral TID WC  . megestrol  400 mg Oral BID  . multivitamin with minerals  1 tablet Oral Daily  . polyethylene glycol  17 g Oral Daily  . polyvinyl alcohol  1 drop Both Eyes QID  . predniSONE  5 mg Oral Daily  . prochlorperazine  5 mg Intravenous Q6H   Continuous Infusions: . ampicillin-sulbactam (UNASYN) IV 3 g (08/19/19 2257)   PRN Meds: acetaminophen **OR** acetaminophen, ipratropium-albuterol, nitroGLYCERIN, prochlorperazine   Vital Signs    Vitals:   08/19/19 1604 08/19/19 2050 08/20/19 0351 08/20/19 0710  BP: (!) 148/69 140/68 (!) 154/68 (!) 105/53  Pulse: 70 73 72 60  Resp: 16 18 18 19   Temp: 98 F (36.7 C) 98.4 F (36.9 C) 98 F (36.7 C) 97.9 F (36.6 C)  TempSrc: Oral Oral Oral Oral  SpO2: 99% 100% 99% 100%   No intake or output data in the 24 hours ending 08/20/19 0756 Last 3 Weights 08/11/2019 04/01/2019 03/14/2019  Weight (lbs) 107 lb 3.2 oz 108 lb 113 lb 5.1 oz  Weight (kg) 48.626 kg 48.988 kg 51.4 kg     Telemetry    NSR - Personally Reviewed  Physical Exam   GEN: No acute distress.  HEENT: Normocephalic, atraumatic, sclera non-icteric. Neck: No JVD or bruits. Cardiac: RRR no murmurs, rubs, or gallops.  Radials/DP/PT 1+ and equal  bilaterally.  Respiratory: Clear to auscultation bilaterally. Breathing is unlabored. GI: Soft, nontender, non-distended, BS +x 4. MS: no deformity. Extremities: No clubbing or cyanosis. No edema. Distal pedal pulses are 2+ and equal bilaterally. Neuro: Awakens to sound of daughter's voice easily, answers questions appropriately, follows commands, mouth tremor noted Psych:  Sleepy but easily arousable, calm when awake  Labs    High Sensitivity Troponin:   Recent Labs  Lab 08/18/19 1339 08/18/19 1544  TROPONINIHS 83* 62*      Cardiac EnzymesNo results for input(s): TROPONINI in the last 168 hours. No results for input(s): TROPIPOC in the last 168 hours.   Chemistry Recent Labs  Lab 08/16/19 0117 08/16/19 0540 08/17/19 0343 08/17/19 0343 08/18/19 0926 08/19/19 0447 08/20/19 0252  NA 125*   < > 130*   < > 131* 132* 133*  K 4.5   < > 4.2   < > 3.6 3.9 3.6  CL 90*   < > 95*   < > 99 95* 94*  CO2 30   < > 28   < > 25 26 27   GLUCOSE 133*   < > 94   < > 134* 96 84  BUN 14   < > 15   < >  8 7* 8  CREATININE 0.96   < > 0.99   < > 0.75 0.67 0.83  CALCIUM 9.3   < > 8.2*   < > 8.4* 8.6* 8.2*  PROT 6.0*  --  4.7*  --   --   --   --   ALBUMIN 3.3*  --  2.5*  --   --   --   --   AST 18  --  20  --   --   --   --   ALT 13  --  15  --   --   --   --   ALKPHOS 82  --  50  --   --   --   --   BILITOT 0.9  --  0.8  --   --   --   --   GFRNONAA 50*   < > 48*   < > >60 >60 59*  GFRAA 57*   < > 55*   < > >60 >60 >60  ANIONGAP 5   < > 7   < > 7 11 12    < > = values in this interval not displayed.     Hematology Recent Labs  Lab 08/16/19 0117 08/17/19 0343 08/20/19 0252  WBC 15.6* 8.7 10.0  RBC 4.21 3.08* 3.22*  HGB 14.0 10.0* 10.3*  HCT 41.4 30.2* 31.3*  MCV 98.3 98.1 97.2  MCH 33.3 32.5 32.0  MCHC 33.8 33.1 32.9  RDW 12.7 13.0 12.6  PLT 307 258 267    BNP Recent Labs  Lab 08/18/19 1544  BNP 811.0*     DDimer No results for input(s): DDIMER in the last 168 hours.    Radiology    EEG  Result Date: 08/18/2019 Brittany Havens, MD     08/18/2019  6:10 PM Patient Name: [name] MRN: [id number] Epilepsy Attending: Lora Hamilton Referring Physician/Provider: [name], PA Date: 08/18/2019 Duration: 23.49 mins Patient history: 86 year old female who initially presented to the hospital secondary to small bowel obstruction.  While in the hospital patient had an episode of transient unresponsiveness associated with some jerking motions.  EEG evaluate for seizures. Level of alertness: Awake, assleep AEDs during EEG study: Gabapentin Technical aspects: This EEG study was done with scalp electrodes positioned according to the 10-20 International system of electrode placement. Electrical activity was acquired at a sampling rate of 500Hz  and reviewed with a high frequency filter of 70Hz  and a low frequency filter of 1Hz . EEG data were recorded continuously and digitally stored. Description: The posterior dominant rhythm consists of 7 Hz activity of moderate voltage (25-35 uV) seen predominantly in posterior head regions, symmetric and reactive to eye opening and eye closing. Sleep was characterized by vertex waves, sleep spindles (12 to 14 Hz), maximal frontocentral region. EEG showed continuous generalized 6-7 Hz theta slowing. Hyperventilation and photic stimulation were not performed.   Patient was noted to have face/jaw tremor during the study.  Concomitant EEG before, during and after the event did not show any EEG changes suggest seizure. ABNORMALITY - Continuous slow, generalized - Background slow IMPRESSION: This study is suggestive of mild diffuse encephalopathy, nonspecific etiology.  No seizures or epileptiform discharges were seen throughout the recording.  Patient was noted to have baseline short-term during the study without concomitant EEG change and was not epileptic. [name]   DG Chest 2 View  Result Date: 08/19/2019 CLINICAL DATA:  Weakness,  shortness of breath, pleural effusion EXAM:  CHEST - 2 VIEW COMPARISON:  08/18/2019 FINDINGS: Similar elevation of the right hemidiaphragm. There are patchy bilateral opacities. Small bilateral pleural effusions with bibasilar atelectasis. No pneumothorax. Stable cardiomediastinal contours. No acute osseous abnormality. IMPRESSION: No substantial change since 08/18/2019. Small bilateral pleural effusions with bibasilar atelectasis. Patchy bilateral opacities. Electronically Signed   By: Macy Mis M.D.   On: 08/19/2019 08:09   CT HEAD WO CONTRAST  Result Date: 08/18/2019 CLINICAL DATA:  Altered mental status EXAM: CT HEAD WITHOUT CONTRAST TECHNIQUE: Contiguous axial images were obtained from the base of the skull through the vertex without intravenous contrast. COMPARISON:  Brain MRI August 24, 2018; head CT March 18, 2014 FINDINGS: Brain: Age related volume loss stable. No intracranial mass, hemorrhage, extra-axial fluid collection, or midline shift. There is patchy small vessel disease in the centra semiovale bilaterally. No evident acute infarct. Vascular: No hyperdense vessel. There is mild calcification in each carotid siphon region. Skull: Bony calvarium a appears intact. Sinuses/Orbits: There is mucosal thickening in several ethmoid air cells. There is a small retention cyst in the posterior left ethmoid region. Other visualized paranasal sinuses are clear. Visualized orbits appear symmetric bilaterally. Other: Mastoid air cells are clear. IMPRESSION: Age related volume loss with patchy periventricular small vessel disease. No evident acute infarct. No mass or hemorrhage. There are foci of arterial vascular calcification. There is mucosal thickening in several ethmoid air cells. Electronically Signed   By: Lowella Grip III M.D.   On: 08/18/2019 14:22   DG CHEST PORT 1 VIEW  Result Date: 08/18/2019 CLINICAL DATA:  Shortness of breath EXAM: PORTABLE CHEST 1 VIEW COMPARISON:  August 16, 2019  FINDINGS: There is cardiomegaly with a degree of pulmonary venous hypertension. There are pleural effusions bilaterally with apparent areas of consolidation in the lung bases. There is ill-defined opacity throughout the left mid lung and left base regions. There is aortic atherosclerosis. No adenopathy. Bones are osteoporotic. IMPRESSION: There is cardiomegaly with a degree of pulmonary vascular congestion. There are pleural effusions bilaterally. There may be a degree of congestive heart failure. Areas of airspace opacity in the left mid and lower lung regions could be indicative of pneumonia or aspiration. Consolidation in the bases also may be indicative of pneumonia or aspiration. There may be superimposed pulmonary edema; more than one of these entities may be present concurrently. Electronically Signed   By: Lowella Grip III M.D.   On: 08/18/2019 10:39    Cardiac Studies   2D Echo 08/2018 1. The left ventricle has normal systolic function, with an ejection  fraction of 55-60%. The cavity size was normal. There is moderately  increased left ventricular wall thickness. Left ventricular diastolic  Doppler parameters are consistent with  pseudonormalization. Elevated left ventricular end-diastolic pressure No  evidence of left ventricular regional wall motion abnormalities.  2. The right ventricle has normal systolic function. The cavity was  normal. There is no increase in right ventricular wall thickness. Right  ventricular systolic pressure could not be assessed.  3. Left atrial size was mildly dilated.  4. The pericardial effusion is localized near the right atrium and  anterior to the right ventricle.  5. Trivial pericardial effusion is present.  6. Mild thickening of the mitral valve leaflet. Mild calcification of the  mitral valve leaflet. There is moderate mitral annular calcification  present.  7. The aortic valve is tricuspid. Moderate sclerosis of the aortic valve.   Aortic valve regurgitation is mild by color flow Doppler.  8. The  aorta is normal unless otherwise noted.  9. The interatrial septum appears to be lipomatous.   Patient Profile     86 y.o. female with paroxysmal atrial fibrillation on amiodarone, prior PE/DVT s/p IVC filter, chronic diastolic heart failure, CKD stage III, multiple SBO (03/2019, 11/2016) who was admitted with nausea, vomiting and SBO with leukocytosis. CT also showed infiltrate possibly secondary to PNA or edema with small left pleural effusion. She received IV hydration. Given leukocytosis, pt was started on IV antibiotics. Family at bedside requested cardiology consultfollowing a period of unresponsiveness 84/21 (not felt related to any cardiac issue), also raised question of possible CHF exacerbation.   Assessment & Plan   1. SBO and potential PNA superimposed on general FTT - per yesterday's notes, general surgery plans percutaneous gastrostomy tube via IR for symptomatic relief and reduce her risk of rehospitalization - regarding PNA, suspected aspiration from gastric secretions - further per primary teams  2. Period of unresponsiveness observed by family - details are unclear - neurology has been following - telemetry reviewed without dangerous arrhythmias during that time -- low suspicion that her heart contributed to this episode - hs troponins are low and flat, not felt to represent ACS (she has a history of troponin leak, per Epic) - no further intervention needed for this  3. Paroxysmal atrial fibrillation - patient is maintaining sinus rhythm/sinus bradycardia - reduced amiodarone to 100 mg daily this admission due to nausea  - Eliquis resumed (on appropriate lower dose)  4. Possible mild acute on chronic diastolic CHF - BNP 222 on 8/5 with CXR showing small bilateral pleural effusions with bibasilar atelectasis, patchy bilateral opacities - s/p IV Lasix 20mg  x 1 yesterday  - I/O's and daily weights  ordered but not yet accomplished - trending hyponatremia as well, stable today at 133 - in context of poor oral intake, would be hesitant to aggressively push diuresis further, will review with Dr. Claiborne Billings  5. Anemia - labs show Hgb historically in the 11-12 range, was 14 on admission in setting of acute illness, trending 10 -> 10.3 and relatively stable - monitoring per primary team  5. Essential HTN - mostly hypertensive this admission with last check 105/53 - repeated to verify and 171/65 - continue amlodipine (newly started) and titrate as neede  Otherwise no new specific cardiac recs. Anticipate potential sign off.  For questions or updates, please contact [place] Please consult [email or web address] for contact info under Cardiology/STEMI.  Signed, Charlie Pitter, PA-C 08/20/2019, 7:56 AM     Patient seen and examined. Agree with assessment and plan.I/O ? +725 since admission. Maintaining NSR at 70. BS improved at bases but decreased more on R. Eliquis to be held for procedure on Mondayu.     Troy Sine, MD, [facility] 08/20/2019 5:16 PM

## 2019-08-20 NOTE — Consult Note (Signed)
   [facility] Inpatient Consult   08/20/2019  NEKA BISE 16-Sep-1920 [id number]   [place] [facility] [ACO] Patient: Medicare NextGen   Chart reviewed for high risk for unplanned readmission score of 28% and reveals the patient is currently being followed by [employer] for potential Home with Hospice verses home with home health. Plan currently for home with hospice.  Primary Care Provider: Antony Contras, MD   Plan: Will follow for confirmed disposition and needs. Will sign off when confirmed.  Natividad Brood, RN BSN [facility] Liaison  [phone] business mobile phone Toll free office [phone]  Fax number: [phone] [email or web address]

## 2019-08-20 NOTE — Progress Notes (Signed)
   08/20/19 0710  Vitals  Temp 97.9 F (36.6 C)  Temp Source Oral  BP (!) 105/53  MAP (mmHg) 68  BP Location Right Arm  BP Method Automatic  Patient Position (if appropriate) Lying  Pulse Rate 60  Pulse Rate Source Monitor  Resp 19  Level of Consciousness  Level of Consciousness Unresponsive  Oxygen Therapy  SpO2 100 %  O2 Device Nasal Cannula  O2 Flow Rate (L/min) 3 L/min   [name] called and stated that pt is unresponsive when trying to wake her for breakfast. This RN arrived to the room and could not wake pt also. Pt vitals taken and CBG 70. Pt  has unlabored breathing and looks to be in a deep sleep. After a couple sternum rubs pt woke up and said "What? I know I was sleeping". Pt was able to state her name, date of birth, and where she was. Will notify MD. Will continue to monitor pt.

## 2019-08-20 NOTE — Progress Notes (Signed)
No further episodes of unresponsiveness.  She has been very somnolent since starting Compazine, but it has been helping her nausea.  Family is requesting if there are any other options.  Patient wakes up to mild stimulation, gives month as February and year is 2020.  She is able to follow commands readily.  The etiology of her decreased responsiveness today is slightly unclear, but without clear evidence of seizure clinically or by EEG, I would not favor starting any antiepileptic therapy.  If she were to have continued episodes, could revisit this at some point in the future.  I suspect that the somnolence is related Compazine, primary team is trying to reduce sedating medications by stopping gabapentin, which seems reasonable.  Also sometimes Reglan may be slightly less sedating than Compazine.  Defer to primary team for management of nausea.  At this time, I do not have any further neurodiagnostic testing, please call with further questions or concerns.  Roland Rack, MD [facility] [phone]  If 7pm- 7am, please page neurology on call as listed in [place].

## 2019-08-20 NOTE — Progress Notes (Signed)
PROGRESS NOTE    [name]  SWN:[id number] DOB: 1920/08/17 DOA: 08/16/2019 PCP: Antony Contras, MD    Brief Narrative:  [name]. [name] is a 86 year old female with past medical history of CKD stage III, chronic atrial fibrillation , prior DVT status post IVC filter placement, diastolic congestive heart failure(Echo 08/2018 EF 55-60%), hypothyroidism and multiple small bowel obstructions in the past(last in 03/2019 and 11/2016)who presented with complaints of abdominal pain nausea and vomiting.  Patient is a poor historian, daughter at the bedside, reported that the patient has been experiencing intermittent nausea for the past several weeks. patient is complaining of lower abdominal pain, dull in quality, radiating around the abdomen in a bandlike distribution, moderate to severe in intensity.  ED: CT imaging of the abdomen revealed evidence of small bowel obstruction with distal transition zone likely secondary to adhesions. Additionally, there is evidence of bronchiectasis with interstitial fibrosiswith areas of patchy airspace infiltrate possibly secondary to pneumonia or edema with a small left pleural effusion.     Consultants:   GI, palliative care.cardiology, gsx, neurology  Procedures: CT scan  Antimicrobials:   Unasyn 8/2   Subjective: This a.m. nursing had a hard time waking up patient with sternal rub.  Finally patient aroused.  She was alert oriented x4 upon waking.  Vital signs baseline stable.  When I saw patient condition communicative and daughters at bedside.  She reports still with nausea but less.  No vomiting or abdominal pain.  Daughter reports patient is somnolent she said she was started on Compazine.  Explained to daughter that Compazine seems to be working for her nausea and they are agreeable.  They are agreeable to continue Compazine  As the only thing it seems to be working for pt.  Objective: Vitals:   08/20/19 0715 08/20/19 0817 08/20/19 0953  08/20/19 1337  BP: (!) 151/62 (!) 171/65 (!) 145/57 139/60  Pulse: 61 62 66 67  Resp: 17 18 17    Temp: 97.9 F (36.6 C) 97.8 F (36.6 C) 97.8 F (36.6 C)   TempSrc:  Oral Oral   SpO2: 100% 100% 100% 100%    Intake/Output Summary (Last 24 hours) at 08/20/2019 1529 Last data filed at 08/20/2019 1520 Gross per 24 hour  Intake 240 ml  Output 0 ml  Net 240 ml   There were no vitals filed for this visit.  Examination:  General exam: Sitting in chair, calm and comfortable Respiratory system: cta no rales Cardiovascular system: RRR, s1/s2 Gastrointestinal system: soft, nt/nd, +bs no guarding Central nervous system: Alert and oriented x4 Extremities: no edema, no cyanosis Skin: warm, dry Psychiatry: mood appropriate,    Data Reviewed: I have personally reviewed following labs and imaging studies  CBC: Recent Labs  Lab 08/16/19 0117 08/17/19 0343 08/20/19 0252  WBC 15.6* 8.7 10.0  NEUTROABS 14.0* 7.1  --   HGB 14.0 10.0* 10.3*  HCT 41.4 30.2* 31.3*  MCV 98.3 98.1 97.2  PLT 307 258 938   Basic Metabolic Panel: Recent Labs  Lab 08/16/19 1933 08/17/19 0343 08/18/19 0926 08/19/19 0447 08/20/19 0252  NA 127* 130* 131* 132* 133*  K 4.7 4.2 3.6 3.9 3.6  CL 93* 95* 99 95* 94*  CO2 24 28 25 26 27   GLUCOSE 116* 94 134* 96 84  BUN 21 15 8  7* 8  CREATININE 1.23* 0.99 0.75 0.67 0.83  CALCIUM 8.3* 8.2* 8.4* 8.6* 8.2*  MG  --  2.0  --   --   --  GFR: Estimated Creatinine Clearance: 29.7 mL/min (by C-G formula based on SCr of 0.83 mg/dL). Liver Function Tests: Recent Labs  Lab 08/16/19 0117 08/17/19 0343  AST 18 20  ALT 13 15  ALKPHOS 82 50  BILITOT 0.9 0.8  PROT 6.0* 4.7*  ALBUMIN 3.3* 2.5*   Recent Labs  Lab 08/16/19 0117  LIPASE 23   No results for input(s): AMMONIA in the last 168 hours. Coagulation Profile: Recent Labs  Lab 08/16/19 0540  INR 1.1   Cardiac Enzymes: No results for input(s): CKTOTAL, CKMB, CKMBINDEX, TROPONINI in the last 168  hours. BNP (last 3 results) No results for input(s): PROBNP in the last 8760 hours. HbA1C: No results for input(s): HGBA1C in the last 72 hours. CBG: Recent Labs  Lab 08/20/19 0710  GLUCAP 70   Lipid Profile: No results for input(s): CHOL, HDL, LDLCALC, TRIG, CHOLHDL, LDLDIRECT in the last 72 hours. Thyroid Function Tests: No results for input(s): TSH, T4TOTAL, FREET4, T3FREE, THYROIDAB in the last 72 hours. Anemia Panel: No results for input(s): VITAMINB12, FOLATE, FERRITIN, TIBC, IRON, RETICCTPCT in the last 72 hours. Sepsis Labs: No results for input(s): PROCALCITON, LATICACIDVEN in the last 168 hours.  Recent Results (from the past 240 hour(s))  SARS Coronavirus 2 by RT PCR (hospital order, performed in [facility] hospital lab) Nasopharyngeal Nasopharyngeal Swab     Status: None   Collection Time: 08/16/19  1:29 AM   Specimen: Nasopharyngeal Swab  Result Value Ref Range Status   SARS Coronavirus 2 NEGATIVE NEGATIVE Final    Comment: (NOTE) SARS-CoV-2 target nucleic acids are NOT DETECTED.  The SARS-CoV-2 RNA is generally detectable in upper and lower respiratory specimens during the acute phase of infection. The lowest concentration of SARS-CoV-2 viral copies this assay can detect is 250 copies / mL. A negative result does not preclude SARS-CoV-2 infection and should not be used as the sole basis for treatment or other patient management decisions.  A negative result may occur with improper specimen collection / handling, submission of specimen other than nasopharyngeal swab, presence of viral mutation(s) within the areas targeted by this assay, and inadequate number of viral copies (<250 copies / mL). A negative result must be combined with clinical observations, patient history, and epidemiological information.  Fact Sheet for Patients:   StrictlyIdeas.no  Fact Sheet for Healthcare  Providers: BankingDealers.co.za  This test is not yet approved or  cleared by the [place] FDA and has been authorized for detection and/or diagnosis of SARS-CoV-2 by FDA under an Emergency Use Authorization (EUA).  This EUA will remain in effect (meaning this test can be used) for the duration of the COVID-19 declaration under Section 564(b)(1) of the Act, 21 U.S.C. section 360bbb-3(b)(1), unless the authorization is terminated or revoked sooner.  Performed at [facility], [place] [address], [place], [place] [address]   Culture, blood (routine x 2)     Status: None (Preliminary result)   Collection Time: 08/16/19  5:25 AM   Specimen: BLOOD  Result Value Ref Range Status   Specimen Description BLOOD RIGHT ANTECUBITAL  Final   Special Requests   Final    BOTTLES DRAWN AEROBIC AND ANAEROBIC Blood Culture adequate volume   Culture   Final    NO GROWTH 4 DAYS Performed at [facility], [place] [address], [place], [place] [address]    Report Status PENDING  Incomplete  Culture, blood (routine x 2)     Status: None (Preliminary result)   Collection  Time: 08/16/19  5:26 AM   Specimen: BLOOD  Result Value Ref Range Status   Specimen Description BLOOD LEFT ANTECUBITAL  Final   Special Requests   Final    BOTTLES DRAWN AEROBIC AND ANAEROBIC Blood Culture adequate volume   Culture   Final    NO GROWTH 4 DAYS Performed at [facility], [address], [place], [place] [address]    Report Status PENDING  Incomplete         Radiology Studies: EEG  Result Date: 08/18/2019 [name], MD     08/18/2019  6:10 PM Patient Name: CACHE DECOURSEY MRN: [id number] Epilepsy Attending: [name] Referring Physician/Provider: [name], PA Date: 08/18/2019 Duration: 23.49 mins Patient history: 86 year old female who initially presented to the hospital secondary to small bowel obstruction.  While in the hospital patient had an episode of transient  unresponsiveness associated with some jerking motions.  EEG evaluate for seizures. Level of alertness: Awake, assleep AEDs during EEG study: Gabapentin Technical aspects: This EEG study was done with scalp electrodes positioned according to the 10-20 International system of electrode placement. Electrical activity was acquired at a sampling rate of 500Hz  and reviewed with a high frequency filter of 70Hz  and a low frequency filter of 1Hz . EEG data were recorded continuously and digitally stored. Description: The posterior dominant rhythm consists of 7 Hz activity of moderate voltage (25-35 uV) seen predominantly in posterior head regions, symmetric and reactive to eye opening and eye closing. Sleep was characterized by vertex waves, sleep spindles (12 to 14 Hz), maximal frontocentral region. EEG showed continuous generalized 6-7 Hz theta slowing. Hyperventilation and photic stimulation were not performed.   Patient was noted to have face/jaw tremor during the study.  Concomitant EEG before, during and after the event did not show any EEG changes suggest seizure. ABNORMALITY - Continuous slow, generalized - Background slow IMPRESSION: This study is suggestive of mild diffuse encephalopathy, nonspecific etiology.  No seizures or epileptiform discharges were seen throughout the recording.  Patient was noted to have baseline short-term during the study without concomitant EEG change and was not epileptic. [name]   DG Chest 2 View  Result Date: 08/19/2019 CLINICAL DATA:  Weakness, shortness of breath, pleural effusion EXAM: CHEST - 2 VIEW COMPARISON:  08/18/2019 FINDINGS: Similar elevation of the right hemidiaphragm. There are patchy bilateral opacities. Small bilateral pleural effusions with bibasilar atelectasis. No pneumothorax. Stable cardiomediastinal contours. No acute osseous abnormality. IMPRESSION: No substantial change since 08/18/2019. Small bilateral pleural effusions with bibasilar atelectasis.  Patchy bilateral opacities. Electronically Signed   By: Macy Mis M.D.   On: 08/19/2019 08:09        Scheduled Meds: . acetaminophen  1,000 mg Oral TID  . amiodarone  100 mg Oral Daily  . amLODipine  2.5 mg Oral Daily  . artificial tears   Both Eyes QHS  . lactobacillus acidophilus  2 tablet Oral TID  . lactose free nutrition  237 mL Oral TID WC  . levothyroxine  150 mcg Oral QAC breakfast  . lubiprostone  8 mcg Oral TID WC  . megestrol  400 mg Oral BID  . multivitamin with minerals  1 tablet Oral Daily  . polyethylene glycol  17 g Oral Daily  . polyvinyl alcohol  1 drop Both Eyes QID  . predniSONE  5 mg Oral Daily  . prochlorperazine  5 mg Intravenous Q6H   Continuous Infusions: . ampicillin-sulbactam (UNASYN) IV 3 g (08/20/19 1109)  . [  START ON 08/23/2019]  ceFAZolin (ANCEF) IV      Assessment & Plan:   Principal Problem:   Small bowel obstruction due to postoperative adhesions Active Problems:   Aspiration pneumonia of both lower lobes due to gastric secretions (HCC)   Chronic diastolic heart failure (HCC)   Chronic kidney disease, stage 3a   Atrial fibrillation, chronic (HCC)   Hyponatremia   Adult failure to thrive   Goals of care, counseling/discussion   Other chronic pain   Nausea   Palliative care by specialist   DNR (do not resuscitate)   Protein-calorie malnutrition, severe   Small bowel obstruction due to postoperative adhesions  Patient suffering a recurrent small bowel obstruction, likely secondary to adhesions Patient is a longstanding known history of recurrent small bowel obstructions with previous hospitalizations in March 2021 and November 2018.During each of these hospitalizations no surgical intervention was pursued. Patient has refused surgical consultation, and NG tube placement-no active vomiting Consulted GI per family's request-input was appreicated- use reglan and compazine General surgery was consulted input was appreciated.  They  recommended percutaneous gastrostomy tube via IR for symptomatic relief and reduce her risk of rehospitalization. Gen surgery okay with her eating and drinking the foods that she enjoys Plan: Holding Eliquis Gtube on Monday by IR   Pneumonia of the bilateral lower lobes secondary to gastric secretions   Patchy bilateral lower lobe infiltrates on CT imaging  Continue bronchodilators nebs   bronchodilators bronchodilators  Blood cultures NTD, continue to monitor Plan:   Continue iv Unasyn   Hyponatremia   Sodium 126 on admission >>> 127 >>130>>>131>>132  likely secondary to poor oral intake and recent vomiting  Improved with IV fluid however now with vascular congestion, thus IV fluids discontinued  .  Was given Lasix 20 mg IV x1 yesterday and sodium level has improved   Acute/Chronic diastolic heart failure ([place]) Was given iv lasix gently. Now euvolemic and clinically improving. May need lasix prn. Chest x-ray with vascular congestion, and todays chest x-ray with not much change BNP elevated Monitor creatinine and electrolytes Plan: Continue strict I/o. Monitor volume status   Chronic kidney disease, stage 3a   Creatinine on admission 1.23, likely prerenal.  Improved to baseline Plan: Continue to monitor   Atrial fibrillation, chronic (HCC)  Temporarily holding home regimen of Eliquis both in case of surgical intervention as well as in preparation for possible NG tube placement to avoid bleeding complications. Patient is typically on amiodarone 200 mg daily  Cards recommended decrease amiodarone to 100 mg daily Plan: Eliquis held for procedure on monday  Hypoalbuminemia -Replaced albumin 25% x1 8/2//21 Continue to monitor  Failure to thrive, malnutrition -Due to poor p.o. intake, multiple comorbidities, and eluding recurrent SBO's, -BMI 18.4, weight 48.6 kg -currently NPO at this time -Consult nutrition for dietary supplements -Per  daughters patient has confusion with Marinol Continue with Megase.    Nutritional status:  Nutrition Problem: Predicted suboptimal nutrient intake Etiology: acute illness (SBO) Signs/Symptoms: percent weight loss Percent weight loss: 14.3 % Interventions: Ensure Enlive (each supplement provides 350kcal and 20 grams of protein), Magic cup, MVI    DVT prophylaxis: lovenox, scd Code Status:DNR Family Communication: Daughter is all updated at bedside Disposition Plan: Home Status is: Inpatient  Remains inpatient appropriate because:need diagnostic testing and treatment, and symptomatic still   Dispo: The patient is from: Home              Anticipated d/c is to: Home  Anticipated d/c date is: 2 days              Patient currently is not medically stable to d/c.needs iv lasix. Plan for IR gastrostomy tube on monday            LOS: 4 days   Time spent: 45 minutes with >50% on coc    Nolberto Hanlon, MD [facility] Pager [phone]  If 7PM-7AM, please contact night-coverage [email or web address] Password [email or web address] 08/20/2019, 3:29 PM

## 2019-08-20 NOTE — Progress Notes (Signed)
Chief Complaint: Patient was seen in consultation today for palliative G-tube  Referring Physician(s): Dr. Kurtis Bushman  Supervising Physician: Arne Cleveland  Patient Status: [facility] - In-pt  History of Present Illness: [name] is a 86 y.o. female with hx of abdominal surgeries and over the years has developed chronic PSBO. This has caused her multiple trips to the hospital in the past few years. She has required NGT in the past and after prolonged bowel rest is able to resume dietary intake. She has avoided surgery and both her and surgical team feel she is not an operative candidate. She is improving this admission, now starting to resume clear liquids diet. She was diagnosed with mild PNA by CXR and placed an abx as precaution but feels fine from a respiratory standpoint.  Surgical consult this admission again recommended no surgery but suggested palliative/venting G-tube as an option that might allow the pt to decompress stomach at home when severe N/V or obstructive sxs recur in hopes to avoid repeat hospital admissions. Family and pt seem open this idea.  PMHx, meds, labs, imaging, allergies reviewed. Comorbidities include A. Fib and Hx of DVT for which she is on Eliquis. Daughters all at bedside.    Past Medical History:  Diagnosis Date  . Blindness   . Blood clot associated with vein wall inflammation   . CKD (chronic kidney disease), stage III   . Colitis   . Goiter    s/p radioactive iod. tx  . Hypothyroidism   . On amiodarone therapy   . Paroxysmal atrial fibrillation (HCC)   . Pulmonary embolus (HCC)    and DVT s/p IVC filter  . SBO (small bowel obstruction) ([place])   . Spinal stenosis of lumbar region with radiculopathy    possible neurogenic claudication recently?  . Temporal arteritis [facility])     Past Surgical History:  Procedure Laterality Date  . APPENDECTOMY  1940  . BACK SURGERY  04/1998   spinal stenosis  . BACK SURGERY  2000   Ruptured Disc  .  BREAST BIOPSY     cysts in both breasts  . CARPAL TUNNEL RELEASE  2002  . ivc filter    . PARTIAL HYSTERECTOMY    . TONSILLECTOMY  1934  . TOTAL KNEE ARTHROPLASTY Left 04/1999  . TOTAL KNEE ARTHROPLASTY Right 02/2000    Allergies: Hydrocortisone, Sulfa antibiotics, Cefdinir, Cortisone, Prednisone, Sulfasalazine, and Sulfonamide derivatives  Medications:  Current Facility-Administered Medications:  .  acetaminophen (TYLENOL) tablet 650 mg, 650 mg, Oral, Q6H PRN, 650 mg at 08/20/19 0824 **OR** acetaminophen (TYLENOL) suppository 650 mg, 650 mg, Rectal, Q6H PRN, Vernelle Emerald, MD, 650 mg at 08/19/19 0503 .  acetaminophen (TYLENOL) tablet 1,000 mg, 1,000 mg, Oral, TID, Shaffer, Johnell Comings, NP, 1,000 mg at 08/19/19 1656 .  amiodarone (PACERONE) tablet 100 mg, 100 mg, Oral, Daily, Troy Sine, MD, 100 mg at 08/19/19 1053 .  amLODipine (NORVASC) tablet 2.5 mg, 2.5 mg, Oral, Daily, Troy Sine, MD, 2.5 mg at 08/19/19 1357 .  Ampicillin-Sulbactam (UNASYN) 3 g in sodium chloride 0.9 % 100 mL IVPB, 3 g, Intravenous, Q12H, Shalhoub, Sherryll Burger, MD, Last Rate: 200 mL/hr at 08/19/19 2257, 3 g at 08/19/19 2257 .  artificial tears (LACRILUBE) ophthalmic ointment, , Both Eyes, QHS, Shahmehdi, Seyed A, MD, Given at 08/19/19 2218 .  gabapentin (NEURONTIN) capsule 200 mg, 200 mg, Oral, QHS, 200 mg at 08/19/19 2202 **AND** gabapentin (NEURONTIN) capsule 100 mg, 100 mg, Oral, BID, Shahmehdi, Seyed  A, MD, 100 mg at 08/19/19 1557 .  ipratropium-albuterol (DUONEB) 0.5-2.5 (3) MG/3ML nebulizer solution 3 mL, 3 mL, Nebulization, Q4H PRN, Shalhoub, Sherryll Burger, MD, 3 mL at 08/18/19 1035 .  lactobacillus acidophilus (BACID) tablet 2 tablet, 2 tablet, Oral, TID, Shahmehdi, Seyed A, MD, 2 tablet at 08/19/19 2202 .  lactose free nutrition (BOOST PLUS) liquid 237 mL, 237 mL, Oral, TID WC, Amery, Sahar, MD .  levothyroxine (SYNTHROID) tablet 150 mcg, 150 mcg, Oral, QAC breakfast, Shahmehdi, Seyed A, MD, 150 mcg at  08/20/19 0610 .  lubiprostone (AMITIZA) capsule 8 mcg, 8 mcg, Oral, TID WC, Shahmehdi, Seyed A, MD, 8 mcg at 08/20/19 4010 .  megestrol (MEGACE) 400 MG/10ML suspension 400 mg, 400 mg, Oral, BID, Shahmehdi, Seyed A, MD, 400 mg at 08/19/19 2202 .  multivitamin with minerals tablet 1 tablet, 1 tablet, Oral, Daily, Shahmehdi, Seyed A, MD, 1 tablet at 08/19/19 0957 .  nitroGLYCERIN (NITROSTAT) SL tablet 0.4 mg, 0.4 mg, Sublingual, Q5 min PRN, Shahmehdi, Seyed A, MD .  polyethylene glycol (MIRALAX / GLYCOLAX) packet 17 g, 17 g, Oral, Daily, Shahmehdi, Seyed A, MD, 17 g at 08/19/19 0958 .  polyvinyl alcohol (LIQUIFILM TEARS) 1.4 % ophthalmic solution 1 drop, 1 drop, Both Eyes, QID, Shahmehdi, Seyed A, MD, 1 drop at 08/19/19 2218 .  predniSONE (DELTASONE) tablet 5 mg, 5 mg, Oral, Daily, Shahmehdi, Seyed A, MD, 5 mg at 08/19/19 0957 .  prochlorperazine (COMPAZINE) injection 5 mg, 5 mg, Intravenous, Q6H, Baron-Johnson, Alison, PA-C, 5 mg at 08/20/19 0612 .  prochlorperazine (COMPAZINE) injection 5 mg, 5 mg, Intravenous, Q6H PRN, Salley Slaughter, PA-C    Family History  Problem Relation Age of Onset  . Breast cancer Sister   . Congestive Heart Failure Mother   . Stroke Father   . Crohn's disease Neg Hx   . Ulcerative colitis Neg Hx     Social History   Socioeconomic History  . Marital status: Widowed    Spouse name: Not on file  . Number of children: 4  . Years of education: College  . Highest education level: Not on file  Occupational History    Employer: RETIRED    Comment: retired  Tobacco Use  . Smoking status: Never Smoker  . Smokeless tobacco: Never Used  Vaping Use  . Vaping Use: Never assessed  Substance and Sexual Activity  . Alcohol use: No  . Drug use: No  . Sexual activity: Never  Other Topics Concern  . Not on file  Social History Narrative   Patient lives at home alone.  Use walker to get around   Caffeine Use: rarely   Patient is right handed   Social  Determinants of Health   Financial Resource Strain:   . Difficulty of Paying Living Expenses:   Food Insecurity:   . Worried About Charity fundraiser in the Last Year:   . Arboriculturist in the Last Year:   Transportation Needs:   . Film/video editor (Medical):   Marland Kitchen Lack of Transportation (Non-Medical):   Physical Activity:   . Days of Exercise per Week:   . Minutes of Exercise per Session:   Stress:   . Feeling of Stress :   Social Connections:   . Frequency of Communication with Friends and Family:   . Frequency of Social Gatherings with Friends and Family:   . Attends Religious Services:   . Active Member of Clubs or Organizations:   . Attends Archivist  Meetings:   Marland Kitchen Marital Status:      Review of Systems: A 12 point ROS discussed and pertinent positives are indicated in the HPI above.  All other systems are negative.  Review of Systems  Vital Signs: BP (!) 145/57   Pulse 66   Temp 97.8 F (36.6 C) (Oral)   Resp 17   SpO2 100%   Physical Exam Constitutional:      Appearance: She is not ill-appearing.  HENT:     Mouth/Throat:     Mouth: Mucous membranes are moist.     Pharynx: Oropharynx is clear.  Cardiovascular:     Rate and Rhythm: Normal rate. Rhythm irregular.     Heart sounds: Normal heart sounds.  Pulmonary:     Effort: Pulmonary effort is normal. No respiratory distress.     Breath sounds: Normal breath sounds.  Abdominal:     General: Abdomen is flat. There is no distension.     Palpations: Abdomen is soft. There is no mass.     Tenderness: There is no abdominal tenderness.  Skin:    General: Skin is warm and dry.  Neurological:     General: No focal deficit present.     Mental Status: She is alert.  Psychiatric:        Mood and Affect: Mood normal.        Judgment: Judgment normal.     Imaging: EEG  Result Date: 08/18/2019 [name], MD     08/18/2019  6:10 PM Patient Name: PRISCILLA KIRSTEIN MRN: [id number] Epilepsy  Attending: [name] Referring Physician/Provider: [name], PA Date: 08/18/2019 Duration: 23.49 mins Patient history: 86 year old female who initially presented to the hospital secondary to small bowel obstruction.  While in the hospital patient had an episode of transient unresponsiveness associated with some jerking motions.  EEG evaluate for seizures. Level of alertness: Awake, assleep AEDs during EEG study: Gabapentin Technical aspects: This EEG study was done with scalp electrodes positioned according to the 10-20 International system of electrode placement. Electrical activity was acquired at a sampling rate of 500Hz  and reviewed with a high frequency filter of 70Hz  and a low frequency filter of 1Hz . EEG data were recorded continuously and digitally stored. Description: The posterior dominant rhythm consists of 7 Hz activity of moderate voltage (25-35 uV) seen predominantly in posterior head regions, symmetric and reactive to eye opening and eye closing. Sleep was characterized by vertex waves, sleep spindles (12 to 14 Hz), maximal frontocentral region. EEG showed continuous generalized 6-7 Hz theta slowing. Hyperventilation and photic stimulation were not performed.   Patient was noted to have face/jaw tremor during the study.  Concomitant EEG before, during and after the event did not show any EEG changes suggest seizure. ABNORMALITY - Continuous slow, generalized - Background slow IMPRESSION: This study is suggestive of mild diffuse encephalopathy, nonspecific etiology.  No seizures or epileptiform discharges were seen throughout the recording.  Patient was noted to have baseline short-term during the study without concomitant EEG change and was not epileptic. Priyanka Barbra Sarks   CT ABDOMEN PELVIS WO CONTRAST  Result Date: 08/16/2019 CLINICAL DATA:  Suspected bowel obstruction. Nausea and vomiting with dark emesis. Previous history of small bowel obstruction. EXAM: CT ABDOMEN AND PELVIS WITHOUT  CONTRAST TECHNIQUE: Multidetector CT imaging of the abdomen and pelvis was performed following the standard protocol without IV contrast. COMPARISON:  07/12/2019 FINDINGS: Lower chest: Bronchiectasis and interstitial fibrosis in the lung bases. Mild patchy airspace infiltration may  represent inflammatory change, pneumonia, or edema. Small left pleural effusion. Small pericardial effusion. Cardiac enlargement with coronary artery calcification. Tortuous and dilated descending aorta with calcification. Hepatobiliary: The hepatic parenchyma appears diffusely dense, possibly artifact or possibly iron overload. No focal lesions. Increased density of the bile may represent vicarious excretion or milk of calcium. Noncalcified gallstone. No bile duct dilatation. Pancreas: Fatty atrophy of the pancreas. Spleen: Normal in size without focal abnormality. Adrenals/Urinary Tract: No adrenal gland nodules. Kidneys are symmetrical. No hydronephrosis or hydroureter. No renal or ureteral stones. Bladder is decompressed. Stomach/Bowel: Dilated gas and fluid-filled small bowel with air-fluid levels. The terminal ileum is decompressed. Stool-filled colon is nondistended. Changes consistent with small bowel obstruction. Distal transition zone, likely adhesions. Vascular/Lymphatic: Diffuse aortic calcification. Low IVC filter. Dilated aorta at the thoracic inlet measuring about 4.2 cm diameter. No significant lymphadenopathy. Reproductive: Uterus and bilateral adnexa are unremarkable. Other: No abdominal wall hernia or abnormality. No abdominopelvic ascites. Musculoskeletal: Lumbar scoliosis convex towards the left. Degenerative changes in the lumbar spine and hips. Geographic sclerosis and lucency in the superior right femoral head may indicate avascular necrosis. IMPRESSION: 1. Small bowel obstruction with distal transition zone, likely adhesions. 2. Cardiac enlargement with coronary artery calcification. Small pericardial effusion. 3.  Bronchiectasis and interstitial fibrosis in the lung bases. Mild patchy airspace infiltration may represent inflammatory change, pneumonia, or edema. Small left pleural effusion. 4. Dilated aorta at the thoracic inlet measuring about 4.2 cm diameter. 5. Low IVC filter. 6. Geographic sclerosis and lucency in the superior right femoral head may indicate avascular necrosis. 7. Noncalcified gallstone. 8. Increased density of the hepatic parenchyma may represent artifact or iron overload. Aortic Atherosclerosis (ICD10-I70.0). Electronically Signed   By: Lucienne Capers M.D.   On: 08/16/2019 00:55   DG Chest 1 View  Result Date: 08/16/2019 CLINICAL DATA:  Nausea and vomiting for 2 hours. EXAM: CHEST  1 VIEW COMPARISON:  03/14/2019 FINDINGS: Shallow inspiration. Normal heart size and pulmonary vascularity. Patchy perihilar and basilar infiltrates, greater in the left base. Changes may represent pneumonia or aspiration. No pleural effusions. No pneumothorax. Mediastinal contours appear intact. Degenerative changes in the spine and shoulders. Aortic calcification. Incidental note of right rib fractures, some appearing acute. IMPRESSION: Patchy perihilar and basilar infiltrates, greater in the left base. Changes may represent pneumonia or aspiration. Electronically Signed   By: Lucienne Capers M.D.   On: 08/16/2019 03:30   DG Chest 2 View  Result Date: 08/19/2019 CLINICAL DATA:  Weakness, shortness of breath, pleural effusion EXAM: CHEST - 2 VIEW COMPARISON:  08/18/2019 FINDINGS: Similar elevation of the right hemidiaphragm. There are patchy bilateral opacities. Small bilateral pleural effusions with bibasilar atelectasis. No pneumothorax. Stable cardiomediastinal contours. No acute osseous abnormality. IMPRESSION: No substantial change since 08/18/2019. Small bilateral pleural effusions with bibasilar atelectasis. Patchy bilateral opacities. Electronically Signed   By: Macy Mis M.D.   On: 08/19/2019 08:09    DG Abd 1 View  Result Date: 08/16/2019 CLINICAL DATA:  86 year old female with nausea vomiting. Small-bowel obstruction. EXAM: ABDOMEN - 1 VIEW COMPARISON:  Abdominal radiograph dated 08/16/2019 and CT dated 08/16/2019 FINDINGS: There is moderate stool in the colon. Air is noted within the colon. There is a mildly dilated loop of bowel in the pelvis measuring 3.6 cm, likely a small bowel loop. Overall interval improvement of the dilated bowel loops since the earlier CT and radiograph. No free air. An IVC filter noted. There is degenerative changes of the spine. No acute osseous pathology. IMPRESSION: Overall  interval improvement of the small bowel dilatation since the earlier CT and radiograph. Continued follow-up recommended. Electronically Signed   By: Anner Crete M.D.   On: 08/16/2019 15:38   CT HEAD WO CONTRAST  Result Date: 08/18/2019 CLINICAL DATA:  Altered mental status EXAM: CT HEAD WITHOUT CONTRAST TECHNIQUE: Contiguous axial images were obtained from the base of the skull through the vertex without intravenous contrast. COMPARISON:  Brain MRI August 24, 2018; head CT March 18, 2014 FINDINGS: Brain: Age related volume loss stable. No intracranial mass, hemorrhage, extra-axial fluid collection, or midline shift. There is patchy small vessel disease in the centra semiovale bilaterally. No evident acute infarct. Vascular: No hyperdense vessel. There is mild calcification in each carotid siphon region. Skull: Bony calvarium a appears intact. Sinuses/Orbits: There is mucosal thickening in several ethmoid air cells. There is a small retention cyst in the posterior left ethmoid region. Other visualized paranasal sinuses are clear. Visualized orbits appear symmetric bilaterally. Other: Mastoid air cells are clear. IMPRESSION: Age related volume loss with patchy periventricular small vessel disease. No evident acute infarct. No mass or hemorrhage. There are foci of arterial vascular calcification. There  is mucosal thickening in several ethmoid air cells. Electronically Signed   By: Lowella Grip III M.D.   On: 08/18/2019 14:22   DG CHEST PORT 1 VIEW  Result Date: 08/18/2019 CLINICAL DATA:  Shortness of breath EXAM: PORTABLE CHEST 1 VIEW COMPARISON:  August 16, 2019 FINDINGS: There is cardiomegaly with a degree of pulmonary venous hypertension. There are pleural effusions bilaterally with apparent areas of consolidation in the lung bases. There is ill-defined opacity throughout the left mid lung and left base regions. There is aortic atherosclerosis. No adenopathy. Bones are osteoporotic. IMPRESSION: There is cardiomegaly with a degree of pulmonary vascular congestion. There are pleural effusions bilaterally. There may be a degree of congestive heart failure. Areas of airspace opacity in the left mid and lower lung regions could be indicative of pneumonia or aspiration. Consolidation in the bases also may be indicative of pneumonia or aspiration. There may be superimposed pulmonary edema; more than one of these entities may be present concurrently. Electronically Signed   By: Lowella Grip III M.D.   On: 08/18/2019 10:39   DG Abd 2 Views  Result Date: 08/16/2019 CLINICAL DATA:  Nausea and vomiting for the last 2 hours.  Pneumonia EXAM: ABDOMEN - 2 VIEW COMPARISON:  Abdominal CT from earlier today FINDINGS: Distended small bowel loops with some improvement from earlier today. No evidence of pneumoperitoneum. Opacity at the lung bases, greater on the left. There is airspace disease and pleural fluid by CT. IVC filter in place. Scoliosis and spinal degeneration. IMPRESSION: 1. Small bowel obstruction by CT earlier the same day. Bowel loops appear less distended than on comparison scout image. 2. Left base infiltrate with small pleural effusion. Electronically Signed   By: Monte Fantasia M.D.   On: 08/16/2019 07:34    Labs:  CBC: Recent Labs    03/17/19 0420 08/16/19 0117 08/17/19 0343  08/20/19 0252  WBC 8.2 15.6* 8.7 10.0  HGB 11.1* 14.0 10.0* 10.3*  HCT 34.2* 41.4 30.2* 31.3*  PLT 229 307 258 267    COAGS: Recent Labs    08/16/19 0540  INR 1.1    BMP: Recent Labs    08/17/19 0343 08/18/19 0926 08/19/19 0447 08/20/19 0252  NA 130* 131* 132* 133*  K 4.2 3.6 3.9 3.6  CL 95* 99 95* 94*  CO2 28 25 26  27  GLUCOSE 94 134* 96 84  BUN 15 8 7* 8  CALCIUM 8.2* 8.4* 8.6* 8.2*  CREATININE 0.99 0.75 0.67 0.83  GFRNONAA 48* >60 >60 59*  GFRAA 55* >60 >60 >60    LIVER FUNCTION TESTS: Recent Labs    03/14/19 1632 03/15/19 0433 08/16/19 0117 08/17/19 0343  BILITOT 1.0 1.0 0.9 0.8  AST 21 16 18 20   ALT 12 12 13 15   ALKPHOS 46 38 82 50  PROT 6.0* 5.2* 6.0* 4.7*  ALBUMIN 3.8 3.2* 3.3* 2.5*    TUMOR MARKERS: No results for input(s): AFPTM, CEA, CA199, CHROMGRNA in the last 8760 hours.  Assessment and Plan: Chronic recurrent PSBO requiring repeat hospitalizations. Long discussion with pt and family regarding role and utility of G-tube to be used primarily for venting/decompressive purposes. They understand it may not help with chronic nausea but should alleviate severe nausea and discomfort if/when stomach is distended from obstructive sxs. Explained procedure in detail including risks and complications which include infection, peritonitis, skin breakdown at insertion site, possible leaking, possible clogging. As well as risk of bleeding. Eliquis will need to be held 48 hours. Family understands tube will need to remain in a minimum of 6 weeks before consideration of removal if no longer desired. Pt and family are agreeable to proceed. Imaging reviewed and pt is a candidate for perc placement. Pt can receive Elquis today and then be held Sat and Sun as procedure can tentatively be performed Monday. Clear liquids diet okay from our standpoint.  All of the patient's questions were answered, patient and family agreeable to proceed.  Consent signed and in  chart.    Thank you for this interesting consult.  I greatly enjoyed meeting [name] and look forward to participating in their care.  A copy of this report was sent to the requesting provider on this date.  Electronically Signed: Ascencion Dike, PA-C 08/20/2019, 10:08 AM   I spent a total of 30 minutes in face to face in clinical consultation, greater than 50% of which was counseling/coordinating care for venting gastrostomy tube.

## 2019-08-20 NOTE — Progress Notes (Signed)
Occupational Therapy Treatment Patient Details Name: [name] MRN: [id number] DOB: Feb 10, 1920 Today's Date: 08/20/2019    History of present illness Pt is a 86 y.o. F with significant PMH of CKD stage III, atrial fibrillation, prior DVT s/p IVC filter, diastolic congestive heart failure, and multiple small bowel obstructions who presents with complaints of abdominal pain, nausea, vomiting. CT imaging revealed evidence of SBO with distal transition zone likely secondary to adhesions. Additionally, pt with PNA of bilateral lower lobes secondary to gastric secretions.   OT comments  Pt seen for follow up OT session with focus on family caregiver education. Pts family continues to have many questions and uncertainty with caring for pt toileting schedule. OT gave family a female urinal from the supply closet to trial with pt for hopeful use at home (pt gets up several times per night to urinate). Continued to educate and problem solve safe BSC transfer for BM. Recommended different style of briefs for pt for easier management with family. OT will continue to follow per family request. D/c recs remain appropriate.    Follow Up Recommendations  Other (comment);Supervision/Assistance - 24 hour (home with hospice)    Equipment Recommendations  3 in 1 bedside commode;Hospital bed;Other (comment) (drop arm BSC; bath chair on wheels)    Recommendations for Other Services      Precautions / Restrictions Precautions Precautions: Fall Restrictions Weight Bearing Restrictions: No       Mobility Bed Mobility Overal bed mobility: Needs Assistance Bed Mobility: Supine to Sit     Supine to sit: Mod assist     General bed mobility comments: mod A to reposition in bed.  Transfers Overall transfer level: Needs assistance Equipment used: Rolling walker (2 wheeled);None Transfers: Risk manager;Sit to/from Stand Sit to Stand: Mod assist Stand pivot transfers: Max assist        General transfer comment: ModA to rise from edge of bed, pt pulling up on walker. Pt with crouched posture, unable to take steps to pivot over to recliner. Sat back down on bed and then reattempted transfer with face to face. Requiring modA for pivot transfer to left to recliner    Balance Overall balance assessment: Needs assistance Sitting-balance support: Feet supported Sitting balance-Leahy Scale: Fair     Standing balance support: Bilateral upper extremity supported Standing balance-Leahy Scale: Poor                             ADL either performed or assessed with clinical judgement   ADL                                         General ADL Comments: Session focused on BADL education and reinforcement with daughters. As well as trialing different DME/AE options     Vision Patient Visual Report: No change from baseline     Perception     Praxis      Cognition Arousal/Alertness: Awake/alert Behavior During Therapy: WFL for tasks assessed/performed Overall Cognitive Status: History of cognitive impairments - at baseline                                 General Comments: Pt family reports some difficulty with speech today        Exercises  Shoulder Instructions       General Comments      Pertinent Vitals/ Pain       Pain Assessment: Faces Faces Pain Scale: Hurts little more Pain Location: back Pain Descriptors / Indicators: Grimacing;Guarding Pain Intervention(s): Monitored during session  Home Living                                          Prior Functioning/Environment              Frequency  Min 2X/week        Progress Toward Goals  OT Goals(current goals can now be found in the care plan section)  Progress towards OT goals: Progressing toward goals  Acute Rehab OT Goals Patient Stated Goal: home with hospice, be comfortable OT Goal Formulation: With family Time For Goal  Achievement: 09/02/19 Potential to Achieve Goals: [place] Discharge plan remains appropriate    Co-evaluation                 AM-PAC OT "6 Clicks" Daily Activity     Outcome Measure   Help from another person eating meals?: A Little Help from another person taking care of personal grooming?: A Little Help from another person toileting, which includes using toliet, bedpan, or urinal?: A Lot Help from another person bathing (including washing, rinsing, drying)?: Total Help from another person to put on and taking off regular upper body clothing?: A Lot Help from another person to put on and taking off regular lower body clothing?: Total 6 Click Score: 12    End of Session    OT Visit Diagnosis: Muscle weakness (generalized) (M62.81);Other abnormalities of gait and mobility (R26.89)   Activity Tolerance Patient limited by fatigue   Patient Left in bed;with call bell/phone within reach;with family/visitor present   Nurse Communication Mobility status        Time: 1152-1204 OT Time Calculation (min): 12 min  Charges: OT General Charges $OT Visit: 1 Visit OT Treatments $Self Care/Home Management : 8-22 mins  Zenovia Jarred, MSOT, OTR/L [facility] Office Number: [phone] Pager: [phone]  Zenovia Jarred 08/20/2019, 2:24 PM

## 2019-08-20 NOTE — Progress Notes (Signed)
   08/20/19 0817  Assess: MEWS Score  Temp 97.8 F (36.6 C)  BP (!) 171/65  Pulse Rate 62  Resp 18  Level of Consciousness Alert  SpO2 100 %  Assess: MEWS Score  MEWS Temp 0  MEWS Systolic 0  MEWS Pulse 0  MEWS RR 0  MEWS LOC 0  MEWS Score 0  MEWS Score Color Green  Assess: if the MEWS score is Yellow or Red  Were vital signs taken at a resting state? Yes  Focused Assessment No change from prior assessment  Early Detection of Sepsis Score *See Row Information* Low  MEWS guidelines implemented *See Row Information* No, previously yellow, continue vital signs every 4 hours  Treat  Pain Scale 0-10  Pain Score 2  Pain Type Chronic pain  Pain Location Back  Pain Orientation Right  Pain Descriptors / Indicators Aching;Discomfort  Pain Frequency Constant  Pain Onset On-going  Pain Intervention(s) Repositioned;Medication (See eMAR)  Notify: Charge Nurse/RN  Name of Charge Nurse/RN Notified Mendel Ryder  Date Charge Nurse/RN Notified 08/20/19  Time Charge Nurse/RN Notified 0815  Notify: Provider  Provider Name/Title Amery  Date Provider Notified 08/20/19  Time Provider Notified [phone]  Notification Type Call  Notification Reason Change in status  Response Other (Comment) (hold gabapentin once)  Date of Provider Response 08/20/19  Time of Provider Response [id number]  Document  Patient Outcome Stabilized after interventions  Progress note created (see row info) Yes  pt was hard to wake this AM, fsbs wnl, hypotsn, sternal rub finally roused her, she was A&Ox4 upon waking, vss now w baseline htn, discussed w MD holding sedating meds, advised to hold gabapentin once this AM. Will cont to monitor.

## 2019-08-20 NOTE — Progress Notes (Signed)
Daily Progress Note   Patient Name: [name]       Date: 08/20/2019 DOB: 11/05/1920  Age: 86 y.o. MRN#: [id number] Attending Physician: Nolberto Hanlon, MD Primary Care Physician: Antony Contras, MD Admit Date: 08/16/2019  Reason for Consultation/Follow-up:  To discuss complex medical decision making related to patient's goals of care  Subjective: Met at bedside with patient and daughters.  Patient reports feeling better and is able to drink a boost.  She has been having bowel movements.  Plans are for a venting PEG to be placed on Monday and then home with [facility] afterward when appropriate.  Daughters express concern over patient's weak voice.  They feel she is less mentally sharp than usually.  Allowed time and space for daughters to express their concerns over their mother as well as what they sense as a lack of ability to communicate well with her medical providers.     Assessment: Patient comfortable.  A&O, appropriate.  Active bowel sounds.     Patient Profile/HPI:  86 y.o. female  with past medical history of CKD, a fib, prior DVT s/p  IVC filter, CHF, hypothyroidism, and multiple SBO admitted on 08/16/2019 with SBO d/t post-op adhesions. Now tolerating soft diet.  Abd xray improving. Patient is also with pneumonia on IV antibiotics. PMT consulted for [place].  Length of Stay: 4   Vital Signs: BP 139/60   Pulse 67   Temp 97.8 F (36.6 C) (Oral)   Resp 17   SpO2 100%  SpO2: SpO2: 100 % O2 Device: O2 Device: Nasal Cannula O2 Flow Rate: O2 Flow Rate (L/min): 3 L/min       Palliative Assessment/Data:  30%     Palliative Care Plan    Recommendations/Plan:  Continue current care.  Plan for venting PEG placement on Monday  Home with Hospice afterward when  appropriate.  PMT will follow intermittently for support.  Code Status:  DNR  Prognosis:   < 6 months    Discharge Planning:  Home with Hospice  Care plan was discussed with daughters  Thank you for allowing the Palliative Medicine Team to assist in the care of this patient.  Total time spent:  35 min.     Greater than 50%  of this time was spent counseling and coordinating care related to the  above assessment and plan.  Florentina Jenny, PA-C Palliative Medicine  Please contact Palliative MedicineTeam phone at [phone] for questions and concerns between 7 am - 7 pm.   Please see AMION for individual provider pager numbers.

## 2019-08-20 NOTE — Progress Notes (Signed)
Physical Therapy Treatment Patient Details Name: [name] MRN: [id number] DOB: 1920-01-30 Today's Date: 08/20/2019    History of Present Illness Pt is a 86 y.o. F with significant PMH of CKD stage III, atrial fibrillation, prior DVT s/p IVC filter, diastolic congestive heart failure, and multiple small bowel obstructions who presents with complaints of abdominal pain, nausea, vomiting. CT imaging revealed evidence of SBO with distal transition zone likely secondary to adhesions. Additionally, pt with PNA of bilateral lower lobes secondary to gastric secretions.    PT Comments    Pt requiring moderate assist for bed mobility and transfers. Unable to take steps with the walker today, so trialed a face to face transfer from bed to chair. Continues with decreased functional mobility secondary to pain, weakness, and decreased activity tolerance. Answered pt family questions/concerns. Will continue to progress mobility as tolerated.    Follow Up Recommendations  Home health PT;Supervision/Assistance - 24 hour     Equipment Recommendations  Hospital bed;Other (comment) (drop arm 3 in 1)    Recommendations for Other Services       Precautions / Restrictions Precautions Precautions: Fall Restrictions Weight Bearing Restrictions: No    Mobility  Bed Mobility Overal bed mobility: Needs Assistance Bed Mobility: Supine to Sit     Supine to sit: Mod assist     General bed mobility comments: Pt initiating well with cueing, modA to pull trunk to upright and use of bed pad to square hips  Transfers Overall transfer level: Needs assistance Equipment used: Rolling walker (2 wheeled);None Transfers: Risk manager;Sit to/from Stand Sit to Stand: Mod assist Stand pivot transfers: Max assist       General transfer comment: ModA to rise from edge of bed, pt pulling up on walker. Pt with crouched posture, unable to take steps to pivot over to recliner. Sat back down on bed and  then reattempted transfer with face to face. Requiring modA for pivot transfer to left to recliner  Ambulation/Gait                 Stairs             Wheelchair Mobility    Modified Rankin (Stroke Patients Only)       Balance Overall balance assessment: Needs assistance Sitting-balance support: Feet supported Sitting balance-Leahy Scale: Fair     Standing balance support: Bilateral upper extremity supported Standing balance-Leahy Scale: Poor                              Cognition Arousal/Alertness: Awake/alert Behavior During Therapy: WFL for tasks assessed/performed Overall Cognitive Status: History of cognitive impairments - at baseline                                 General Comments: Pt family reports some difficulty with speech today      Exercises      General Comments        Pertinent Vitals/Pain Pain Assessment: Faces Faces Pain Scale: Hurts little more Pain Location: back Pain Descriptors / Indicators: Grimacing;Guarding Pain Intervention(s): Limited activity within patient's tolerance;Monitored during session;Repositioned    Home Living                      Prior Function            PT Goals (current goals can now be found  in the care plan section) Acute Rehab PT Goals Patient Stated Goal: home with hospice, be comfortable Potential to Achieve Goals: Fair Progress towards PT goals: Progressing toward goals    Frequency    Min 3X/week      PT Plan Current plan remains appropriate    Co-evaluation              AM-PAC PT "6 Clicks" Mobility   Outcome Measure  Help needed turning from your back to your side while in a flat bed without using bedrails?: A Lot Help needed moving from lying on your back to sitting on the side of a flat bed without using bedrails?: A Lot Help needed moving to and from a bed to a chair (including a wheelchair)?: A Lot Help needed standing up from a  chair using your arms (e.g., wheelchair or bedside chair)?: A Lot Help needed to walk in hospital room?: A Lot Help needed climbing 3-5 steps with a railing? : Total 6 Click Score: 11    End of Session Equipment Utilized During Treatment: Gait belt;Oxygen Activity Tolerance: Patient tolerated treatment well Patient left: in chair;with call bell/phone within reach;with family/visitor present Nurse Communication: Mobility status PT Visit Diagnosis: Unsteadiness on feet (R26.81);Other abnormalities of gait and mobility (R26.89);Muscle weakness (generalized) (M62.81);Difficulty in walking, not elsewhere classified (R26.2);Pain Pain - part of body:  (back)     Time: 4920-1007 PT Time Calculation (min) (ACUTE ONLY): 42 min  Charges:  $Therapeutic Activity: 38-52 mins                       Wyona Almas, PT, DPT Acute Rehabilitation Services Pager [phone] Office [phone]    Deno Etienne 08/20/2019, 11:12 AM

## 2019-08-21 DIAGNOSIS — I5043 Acute on chronic combined systolic (congestive) and diastolic (congestive) heart failure: Secondary | ICD-10-CM

## 2019-08-21 DIAGNOSIS — Z515 Encounter for palliative care: Secondary | ICD-10-CM

## 2019-08-21 LAB — BASIC METABOLIC PANEL
Anion gap: 12 (ref 5–15)
BUN: 13 mg/dL (ref 8–23)
CO2: 29 mmol/L (ref 22–32)
Calcium: 8.6 mg/dL — ABNORMAL LOW (ref 8.9–10.3)
Chloride: 92 mmol/L — ABNORMAL LOW (ref 98–111)
Creatinine, Ser: 0.86 mg/dL (ref 0.44–1.00)
GFR calc Af Amer: >60 mL/min (ref >60)
GFR calc non Af Amer: 57 mL/min — ABNORMAL LOW (ref >60)
Glucose, Bld: 109 mg/dL — ABNORMAL HIGH (ref 70–99)
Potassium: 3 mmol/L — ABNORMAL LOW (ref 3.5–5.1)
Sodium: 133 mmol/L — ABNORMAL LOW (ref 135–145)

## 2019-08-21 LAB — CULTURE, BLOOD (ROUTINE X 2)
Culture: NO GROWTH
Culture: NO GROWTH
Special Requests: ADEQUATE
Special Requests: ADEQUATE

## 2019-08-21 LAB — BRAIN NATRIURETIC PEPTIDE: B Natriuretic Peptide: 321.6 pg/mL — ABNORMAL HIGH (ref 0.0–100.0)

## 2019-08-21 MED ORDER — FUROSEMIDE 10 MG/ML IJ SOLN
20.0000 mg | Freq: Once | INTRAMUSCULAR | Status: AC
  Administered 2019-08-21: 20 mg via INTRAVENOUS
  Filled 2019-08-21: qty 2

## 2019-08-21 MED ORDER — POTASSIUM CHLORIDE 10 MEQ/100ML IV SOLN
10.0000 meq | INTRAVENOUS | Status: AC
  Administered 2019-08-21 (×4): 10 meq via INTRAVENOUS

## 2019-08-21 MED ORDER — POTASSIUM CHLORIDE CRYS ER 20 MEQ PO TBCR
40.0000 meq | EXTENDED_RELEASE_TABLET | Freq: Once | ORAL | Status: AC
  Administered 2019-08-21: 40 meq via ORAL

## 2019-08-21 MED ORDER — POTASSIUM CHLORIDE 10 MEQ/100ML IV SOLN
INTRAVENOUS | Status: AC
  Filled 2019-08-21: qty 100

## 2019-08-21 MED ORDER — FAMOTIDINE 20 MG PO TABS
20.0000 mg | ORAL_TABLET | Freq: Every day | ORAL | Status: DC
  Administered 2019-08-21 – 2019-08-24 (×3): 20 mg via ORAL
  Filled 2019-08-21 (×3): qty 1

## 2019-08-21 NOTE — Significant Event (Signed)
Rapid Response Event Note   Reason for Call : Patient complaining of trouble breathing and chest tightness.  The nurse also mentioned that the family was concerned about their mother's left arm and that it was swollen.   Initial Focused Assessment: Patient did not appear to in any distress when I walked into the room.  The patient was on 2L [place] and her O2 saturation was 100%.  The nurse had just given the patient some Lasix to help with her work of breathing.  The patient's chest tightness was probably related to her SBO.  The patient and the family did not want to have an NG tube placed to decompress the bowel.  She was scheduled to have a G tube placed on Monday.    BP 187/69 Temp 97.9 oral Pulse 68 Resp 13 O2 100% [place] 1L      Interventions: Vitals were taken, patient was readjusted in bed, new IV was placed, old IV was removed, we elevated her left arm, we offered reassurance to the patient and the family   Plan of Care: We informed the RN to let us know if there was anything else that we could assist.  The doctor put in a cardiology consult     Event Summary:   MD Notified:  Call Time: 0830 Arrival Time: [place] Time: 0910  Venetia Maxon, RN

## 2019-08-21 NOTE — Progress Notes (Addendum)
Progress Note  Patient Name: [name] Date of Encounter: 08/21/2019  Primary Cardiologist: Sinclair Grooms, MD  Subjective   Family in the room concerned about patient having more SOB.  Was given Lasix 20mg  IV 8/5 for elevated BNP of 800 and small pleural effusions on Cxray and patch opacities.  BNP today down to 321 but still with some SOB and tightness around her abdomen at times.    Inpatient Medications    Scheduled Meds: . acetaminophen  1,000 mg Oral TID  . amiodarone  100 mg Oral Daily  . amLODipine  2.5 mg Oral Daily  . artificial tears   Both Eyes QHS  . enoxaparin (LOVENOX) injection  40 mg Subcutaneous Q24H  . famotidine  20 mg Oral Daily  . lactobacillus acidophilus  2 tablet Oral TID  . lactose free nutrition  237 mL Oral TID WC  . levothyroxine  150 mcg Oral QAC breakfast  . lubiprostone  8 mcg Oral TID WC  . megestrol  400 mg Oral BID  . multivitamin with minerals  1 tablet Oral Daily  . polyethylene glycol  17 g Oral Daily  . polyvinyl alcohol  1 drop Both Eyes QID  . predniSONE  5 mg Oral Daily   Continuous Infusions: . ampicillin-sulbactam (UNASYN) IV Stopped (08/21/19 0717)  . [START ON 08/23/2019]  ceFAZolin (ANCEF) IV     PRN Meds: acetaminophen **OR** acetaminophen, ipratropium-albuterol, nitroGLYCERIN, prochlorperazine   Vital Signs    Vitals:   08/20/19 2150 08/21/19 0200 08/21/19 0805 08/21/19 0900  BP: (!) 110/57 139/70 (!) 194/77 (!) 187/69  Pulse: 74 69 65 67  Resp: 14 16 16 13   Temp: (!) 97.5 F (36.4 C) 98.3 F (36.8 C)  97.9 F (36.6 C)  TempSrc: Oral Axillary Axillary Oral  SpO2: 98% 99% 99% 96%    Intake/Output Summary (Last 24 hours) at 08/21/2019 1052 Last data filed at 08/20/2019 2150 Gross per 24 hour  Intake 360 ml  Output 550 ml  Net -190 ml   Last 3 Weights 08/11/2019 04/01/2019 03/14/2019  Weight (lbs) 107 lb 3.2 oz 108 lb 113 lb 5.1 oz  Weight (kg) 48.626 kg 48.988 kg 51.4 kg     Telemetry    NSR - Personally  Reviewed  Physical Exam   GEN: thin frail and cachectic ill appearing HEENT: Normal NECK: No JVD; No carotid bruits LYMPHATICS: No lymphadenopathy CARDIAC:RRR, no murmurs, rubs, gallops RESPIRATORY:  Crackles at bases bilaterally ABDOMEN: Soft, non-tender, non-distended MUSCULOSKELETAL:  No edema; No deformity  SKIN: Warm and dry NEUROLOGIC:  Alert and oriented x 3 PSYCHIATRIC:  Normal affect    Labs    High Sensitivity Troponin:   Recent Labs  Lab 08/18/19 1339 08/18/19 1544  TROPONINIHS 83* 62*      Cardiac EnzymesNo results for input(s): TROPONINI in the last 168 hours. No results for input(s): TROPIPOC in the last 168 hours.   Chemistry Recent Labs  Lab 08/16/19 0117 08/16/19 0540 08/17/19 0343 08/17/19 0343 08/18/19 0926 08/19/19 0447 08/20/19 0252  NA 125*   < > 130*   < > 131* 132* 133*  K 4.5   < > 4.2   < > 3.6 3.9 3.6  CL 90*   < > 95*   < > 99 95* 94*  CO2 30   < > 28   < > 25 26 27   GLUCOSE 133*   < > 94   < > 134* 96 84  BUN  14   < > 15   < > 8 7* 8  CREATININE 0.96   < > 0.99   < > 0.75 0.67 0.83  CALCIUM 9.3   < > 8.2*   < > 8.4* 8.6* 8.2*  PROT 6.0*  --  4.7*  --   --   --   --   ALBUMIN 3.3*  --  2.5*  --   --   --   --   AST 18  --  20  --   --   --   --   ALT 13  --  15  --   --   --   --   ALKPHOS 82  --  50  --   --   --   --   BILITOT 0.9  --  0.8  --   --   --   --   GFRNONAA 50*   < > 48*   < > >60 >60 59*  GFRAA 57*   < > 55*   < > >60 >60 >60  ANIONGAP 5   < > 7   < > 7 11 12    < > = values in this interval not displayed.     Hematology Recent Labs  Lab 08/16/19 0117 08/17/19 0343 08/20/19 0252  WBC 15.6* 8.7 10.0  RBC 4.21 3.08* 3.22*  HGB 14.0 10.0* 10.3*  HCT 41.4 30.2* 31.3*  MCV 98.3 98.1 97.2  MCH 33.3 32.5 32.0  MCHC 33.8 33.1 32.9  RDW 12.7 13.0 12.6  PLT 307 258 267    BNP Recent Labs  Lab 08/18/19 1544 08/21/19 0919  BNP 811.0* 321.6*     DDimer No results for input(s): DDIMER in the last 168  hours.   Radiology    No results found.  Cardiac Studies   2D Echo 08/2018 1. The left ventricle has normal systolic function, with an ejection  fraction of 55-60%. The cavity size was normal. There is moderately  increased left ventricular wall thickness. Left ventricular diastolic  Doppler parameters are consistent with  pseudonormalization. Elevated left ventricular end-diastolic pressure No  evidence of left ventricular regional wall motion abnormalities.  2. The right ventricle has normal systolic function. The cavity was  normal. There is no increase in right ventricular wall thickness. Right  ventricular systolic pressure could not be assessed.  3. Left atrial size was mildly dilated.  4. The pericardial effusion is localized near the right atrium and  anterior to the right ventricle.  5. Trivial pericardial effusion is present.  6. Mild thickening of the mitral valve leaflet. Mild calcification of the  mitral valve leaflet. There is moderate mitral annular calcification  present.  7. The aortic valve is tricuspid. Moderate sclerosis of the aortic valve.  Aortic valve regurgitation is mild by color flow Doppler.  8. The aorta is normal unless otherwise noted.  9. The interatrial septum appears to be lipomatous.   Patient Profile     86 y.o. female with paroxysmal atrial fibrillation on amiodarone, prior PE/DVT s/p IVC filter, chronic diastolic heart failure, CKD stage III, multiple SBO (03/2019, 11/2016) who was admitted with nausea, vomiting and SBO with leukocytosis. CT also showed infiltrate possibly secondary to PNA or edema with small left pleural effusion. She received IV hydration. Given leukocytosis, pt was started on IV antibiotics. Family at bedside requested cardiology consultfollowing a period of unresponsiveness 84/21 (not felt related to any cardiac issue), also raised question of  possible CHF exacerbation.   Assessment & Plan   1. SBO and potential  PNA superimposed on general FTT - per yesterday's notes, general surgery plans percutaneous gastrostomy tube via IR for symptomatic relief and reduce her risk of rehospitalization - regarding PNA, suspected aspiration from gastric secretions - further per primary teams  2. Period of unresponsiveness observed by family - details are unclear - neurology has been following - telemetry reviewed without dangerous arrhythmias during that time -- low suspicion that her heart contributed to this episode - hs troponins are low and flat, not felt to represent ACS (she has a history of troponin leak, per Epic) - no further intervention needed for this  3. Paroxysmal atrial fibrillation - maintaining NSR on tele - continue amiodarone to 100 mg daily (dose decreased this admission due to nausea ) - Eliquis resumed (on appropriate lower dose)  4. Possible mild acute on chronic diastolic CHF - BNP 263 on 8/5 with CXR showing small bilateral pleural effusions with bibasilar atelectasis, patchy bilateral opacities - s/p IV Lasix 20mg  x 1 on 8/5 and now BNP down to 321 - she put out 550cc yesterday and is net + 415cc - trending hyponatremia as well, Na stable today and 133 - in context of poor oral intake, would be hesitant to aggressively push diuresis further but she does appear mildly volume overloaded on exam with SOB - will give Lasix 20mg  IV now -check BMET today and replete K+ if < 4  5. Anemia - labs show Hgb historically in the 11-12 range, was 14 on admission in setting of acute illness, trending 10 -> 10.3 and relatively stable - monitoring per primary team  5. Essential HTN - BP labile and ranging from 139/70 to 187/56mmHg this am - continue amlodipine (newly started) and titrate as needed>>just got am dose  I have spent a total of 35 minutes with patient reviewing 2D echo , telemetry, EKGs, labs and examining patient as well as establishing an assessment and plan that was discussed  with the patient.  > 50% of time was spent in direct patient care.     For questions or updates, please contact [place] Please consult [email or web address] for contact info under Cardiology/STEMI.  Signed, Fransico Him, MD 08/21/2019, 10:52 AM     Patient seen and examined. Agree with assessment and plan.I/O ? +725 since admission. Maintaining NSR at 70. BS improved at bases but decreased more on R. Eliquis to be held for procedure on Mondayu.     Troy Sine, MD, [facility] 08/21/2019 10:52 AM

## 2019-08-21 NOTE — Progress Notes (Signed)
PROGRESS NOTE    [name]  QQI:[id number] DOB: 28-Jan-1920 DOA: 08/16/2019 PCP: Antony Contras, MD    Brief Narrative:  Ms. [name] is a 86 year old female with past medical history of CKD stage III, chronic atrial fibrillation , prior DVT status post IVC filter placement, diastolic congestive heart failure(Echo 08/2018 EF 55-60%), hypothyroidism and multiple small bowel obstructions in the past(last in 03/2019 and 11/2016)who presented with complaints of abdominal pain nausea and vomiting.  Patient is a poor historian, daughter at the bedside, reported that the patient has been experiencing intermittent nausea for the past several weeks. patient is complaining of lower abdominal pain, dull in quality, radiating around the abdomen in a bandlike distribution, moderate to severe in intensity.  ED: CT imaging of the abdomen revealed evidence of small bowel obstruction with distal transition zone likely secondary to adhesions. Additionally, there is evidence of bronchiectasis with interstitial fibrosiswith areas of patchy airspace infiltrate possibly secondary to pneumonia or edema with a small left pleural effusion.     Consultants:   GI, palliative care.cardiology, gsx, neurology  Procedures: CT scan  Antimicrobials:   Unasyn 8/2   Subjective: All 3 daughters at bedside.  The complaint patient had some hand swelling and they had to manage for cardiology to come by see the patient.  I told him I spoke to cardiology earlier.  Per daughter patient had complained of abdominal pain going across her abdomen.  Had a bowel movement.  When I asked patient about abdominal pain she reported currently without any pain.  Denies any shortness of breath, nausea or vomiting.   Objective: Vitals:   08/21/19 0200 08/21/19 0805 08/21/19 0900 08/21/19 1418  BP: 139/70 (!) 194/77 (!) 187/69 (!) 133/51  Pulse: 69 65 67 68  Resp: 16 16 13    Temp: 98.3 F (36.8 C)  97.9 F (36.6 C)     TempSrc: Axillary Axillary Oral   SpO2: 99% 99% 96% 100%    Intake/Output Summary (Last 24 hours) at 08/21/2019 1437 Last data filed at 08/21/2019 1140 Gross per 24 hour  Intake 480 ml  Output 750 ml  Net -270 ml   There were no vitals filed for this visit.  Examination:  General exam: Laying in bed sitting up, eyes closed, NAD Respiratory system: bibasilar crackles , no wheezing Cardiovascular system: RRR, s1/s2 +2/6 sm lsb Gastrointestinal system: soft, nt/nd, +bs no guarding or rebound. Central nervous system: aax0x3 Extremities: no edmea no cyanosis Skin: warm, dry Psychiatry: Appropriate mood and current setting    Data Reviewed: I have personally reviewed following labs and imaging studies  CBC: Recent Labs  Lab 08/16/19 0117 08/17/19 0343 08/20/19 0252  WBC 15.6* 8.7 10.0  NEUTROABS 14.0* 7.1  --   HGB 14.0 10.0* 10.3*  HCT 41.4 30.2* 31.3*  MCV 98.3 98.1 97.2  PLT 307 258 941   Basic Metabolic Panel: Recent Labs  Lab 08/17/19 0343 08/18/19 0926 08/19/19 0447 08/20/19 0252 08/21/19 1319  NA 130* 131* 132* 133* 133*  K 4.2 3.6 3.9 3.6 3.0*  CL 95* 99 95* 94* 92*  CO2 28 25 26 27 29   GLUCOSE 94 134* 96 84 109*  BUN 15 8 7* 8 13  CREATININE 0.99 0.75 0.67 0.83 0.86  CALCIUM 8.2* 8.4* 8.6* 8.2* 8.6*  MG 2.0  --   --   --   --    GFR: Estimated Creatinine Clearance: 28.7 mL/min (by C-G formula based on SCr of 0.86 mg/dL). Liver Function  Tests: Recent Labs  Lab 08/16/19 0117 08/17/19 0343  AST 18 20  ALT 13 15  ALKPHOS 82 50  BILITOT 0.9 0.8  PROT 6.0* 4.7*  ALBUMIN 3.3* 2.5*   Recent Labs  Lab 08/16/19 0117  LIPASE 23   No results for input(s): AMMONIA in the last 168 hours. Coagulation Profile: Recent Labs  Lab 08/16/19 0540  INR 1.1   Cardiac Enzymes: No results for input(s): CKTOTAL, CKMB, CKMBINDEX, TROPONINI in the last 168 hours. BNP (last 3 results) No results for input(s): PROBNP in the last 8760 hours. HbA1C: No  results for input(s): HGBA1C in the last 72 hours. CBG: Recent Labs  Lab 08/20/19 0710  GLUCAP 70   Lipid Profile: No results for input(s): CHOL, HDL, LDLCALC, TRIG, CHOLHDL, LDLDIRECT in the last 72 hours. Thyroid Function Tests: No results for input(s): TSH, T4TOTAL, FREET4, T3FREE, THYROIDAB in the last 72 hours. Anemia Panel: No results for input(s): VITAMINB12, FOLATE, FERRITIN, TIBC, IRON, RETICCTPCT in the last 72 hours. Sepsis Labs: No results for input(s): PROCALCITON, LATICACIDVEN in the last 168 hours.  Recent Results (from the past 240 hour(s))  SARS Coronavirus 2 by RT PCR (hospital order, performed in [facility] hospital lab) Nasopharyngeal Nasopharyngeal Swab     Status: None   Collection Time: 08/16/19  1:29 AM   Specimen: Nasopharyngeal Swab  Result Value Ref Range Status   SARS Coronavirus 2 NEGATIVE NEGATIVE Final    Comment: (NOTE) SARS-CoV-2 target nucleic acids are NOT DETECTED.  The SARS-CoV-2 RNA is generally detectable in upper and lower respiratory specimens during the acute phase of infection. The lowest concentration of SARS-CoV-2 viral copies this assay can detect is 250 copies / mL. A negative result does not preclude SARS-CoV-2 infection and should not be used as the sole basis for treatment or other patient management decisions.  A negative result may occur with improper specimen collection / handling, submission of specimen other than nasopharyngeal swab, presence of viral mutation(s) within the areas targeted by this assay, and inadequate number of viral copies (<250 copies / mL). A negative result must be combined with clinical observations, patient history, and epidemiological information.  Fact Sheet for Patients:   StrictlyIdeas.no  Fact Sheet for Healthcare Providers: BankingDealers.co.za  This test is not yet approved or  cleared by the [place] FDA and has been authorized for  detection and/or diagnosis of SARS-CoV-2 by FDA under an Emergency Use Authorization (EUA).  This EUA will remain in effect (meaning this test can be used) for the duration of the COVID-19 declaration under Section 564(b)(1) of the Act, 21 U.S.C. section 360bbb-3(b)(1), unless the authorization is terminated or revoked sooner.  Performed at [facility], [place] [address], [place], [place] [address]   Culture, blood (routine x 2)     Status: None   Collection Time: 08/16/19  5:25 AM   Specimen: BLOOD  Result Value Ref Range Status   Specimen Description BLOOD RIGHT ANTECUBITAL  Final   Special Requests   Final    BOTTLES DRAWN AEROBIC AND ANAEROBIC Blood Culture adequate volume   Culture   Final    NO GROWTH 5 DAYS Performed at [facility], [place] [address] [place], [place] [address]    Report Status 08/21/2019 FINAL  Final  Culture, blood (routine x 2)     Status: None   Collection Time: 08/16/19  5:26 AM   Specimen: BLOOD  Result Value Ref Range Status   Specimen Description BLOOD  LEFT ANTECUBITAL  Final   Special Requests   Final    BOTTLES DRAWN AEROBIC AND ANAEROBIC Blood Culture adequate volume   Culture   Final    NO GROWTH 5 DAYS Performed at [facility], [address], [place], [place] [address]    Report Status 08/21/2019 FINAL  Final         Radiology Studies: No results found.      Scheduled Meds: . acetaminophen  1,000 mg Oral TID  . amiodarone  100 mg Oral Daily  . amLODipine  2.5 mg Oral Daily  . artificial tears   Both Eyes QHS  . enoxaparin (LOVENOX) injection  40 mg Subcutaneous Q24H  . famotidine  20 mg Oral Daily  . lactobacillus acidophilus  2 tablet Oral TID  . lactose free nutrition  237 mL Oral TID WC  . levothyroxine  150 mcg Oral QAC breakfast  . lubiprostone  8 mcg Oral TID WC  . megestrol  400 mg Oral BID  . multivitamin with minerals  1 tablet Oral Daily  . polyethylene glycol  17 g Oral Daily  . polyvinyl  alcohol  1 drop Both Eyes QID  . predniSONE  5 mg Oral Daily   Continuous Infusions: . ampicillin-sulbactam (UNASYN) IV 3 g (08/21/19 1100)  . [START ON 08/23/2019]  ceFAZolin (ANCEF) IV      Assessment & Plan:   Principal Problem:   Small bowel obstruction due to postoperative adhesions Active Problems:   Aspiration pneumonia of both lower lobes due to gastric secretions (HCC)   Chronic diastolic heart failure (HCC)   Chronic kidney disease, stage 3a   Atrial fibrillation, chronic (HCC)   Hyponatremia   Adult failure to thrive   Goals of care, counseling/discussion   Other chronic pain   Nausea   Palliative care by specialist   DNR (do not resuscitate)   Protein-calorie malnutrition, severe   Pleural effusion   Small bowel obstruction due to postoperative adhesions Patient suffering a recurrent small bowel obstruction, likely secondary to adhesions Patient is a longstanding known history of recurrent small bowel obstructions with previous hospitalizations in March 2021 and November 2018.During each of these hospitalizations no surgical intervention was pursued. Patient has refused surgical consultation, and NG tube placement-no active vomiting Consulted GI per family's request-input was appreicated- use reglan and compazine General surgery was consulted input was appreciated.  They recommended percutaneous gastrostomy tube via IR for symptomatic relief and reduce her risk of rehospitalization. Gen surgery okay with her eating and drinking the foods that she enjoys Plan: Continue holding Eliquis for G-tube placement on Monday   Pneumonia of the bilateral lower lobes secondary to gastric secretions   Patchy bilateral lower lobe infiltrates on CT imaging  Continue bronchodilators nebs   bronchodilators bronchodilators  Blood cultures to date negative continue to monitor Plan:   Continue IV Unasyn   Hyponatremia   Sodium 126 on admission >>> 127  >>130>>>131>>132>>>133  likely secondary to poor oral intake and recent vomiting  Improved with IV fluid however now with vascular congestion, thus IV fluids discontinued  .  Then improved more with iv diuretics low dose of lasix.  Now stabilized and will continue to monitor   Acute/Chronic diastolic heart failure (HCC) BNP initially in the 800s, decreased to 321 today.   On exam mildly volume overloaded, and pt was c/o sob. Will give lasix 20mg  iv x1. Discussed this with all her daughters . Continue I/o. Monitor creatinine and electrolytes  Cards following    Hypokalemia- Will place IV and p.o. Ck am labs   Chronic kidney disease, stage 3a   Creatinine on admission 1.23, likely prerenal.  Improved to baseline, now stable Plan: Continue to monitor  Episodes of Unresponsive- Etiology unlcear. EEG no seizures. No arrhythmia on tele. She is somnolent possibly due to compazine and also pt telling me she is not sleeping much thus combo of all.  Spoke about this with family that pt on compazine prn and if they want to switch to something else as we dont have too many options . Not sure Reglan is good choice in this elderly pt. They have agreed to continue with compazine and they do understand that it may cause her to be sleepy.   Atrial fibrillation, chronic (HCC) -Temporarily holding home regimen of Eliquis both in case of surgical intervention as well as in preparation for possible NG tube placement to avoid bleeding complications. Patient is typically on amiodarone 200 mg daily  Cards recommended decrease amiodarone to 100 mg daily Plan: Continue with amiodarone Eliquis on hold for procedure on Monday, will ask IR when to resume   Hypoalbuminemia -Replaced albumin 25% x1 8/2//21 Continue to monitor  Failure to thrive, malnutrition -Due to poor p.o. intake, multiple comorbidities, and eluding recurrent SBO's, -BMI 18.4, weight 48.6 kg -currently NPO at this  time -Consult nutrition for dietary supplements -Per daughters patient has confusion with Marinol Continue with Megace Currently on full liquid diet due to small bowel obstruction   Nutritional status:  Nutrition Problem: Predicted suboptimal nutrient intake Etiology: acute illness (SBO) Signs/Symptoms: percent weight loss Percent weight loss: 14.3 % Interventions: Ensure Enlive (each supplement provides 350kcal and 20 grams of protein), Magic cup, MVI    DVT prophylaxis: lovenox, scd Code Status:DNR Family Communication: Daughter is all updated at bedside, all questions and concerns were addressed.  Palliative care was at my side during the questioning and answering.  Daughters verbalized understanding. Disposition Plan: Home Status is: Inpatient  Remains inpatient appropriate because:need diagnostic testing and treatment, and symptomatic still   Dispo: The patient is from: Home              Anticipated d/c is to: Home              Anticipated d/c date is: 2 days              Patient currently is not medically stable to d/c.needs iv lasix. Plan for IR gastrostomy tube on monday and then possible DC            LOS: 5 days   Time spent: 45 minutes with >50% on coc    Nolberto Hanlon, MD [facility] Pager [phone]  If 7PM-7AM, please contact night-coverage [email or web address] Password [facility] 08/21/2019, 2:37 PM

## 2019-08-21 NOTE — Progress Notes (Signed)
This morning during shift change, pt's three daughters requested that cardiology be called for the following reasons- the patient's left hand is swollen, pt is complaining of tightness around the epigastric area ,and she is reporting trouble breathing. Pt was being cleaned by the nurse tech and I asked for her to obtain vital signs when she was finished. Dr. Kurtis Bushman notified. Rapid response notified.

## 2019-08-21 NOTE — Progress Notes (Signed)
Called to room last night by patient's daughter because pt complaining of being hot and shaking inside and pt stated she was having a hard time taking a deep breath. VSS, pt's temp taken axillary 98.3,BP 139/70,pulse 69,respirations 16,O2 sat 99%@L  [place]. Pt repositioned in bed to assist with breathing. Coarse crackles heard in lower lobes. Pt had received her  scheduled Tylenol earlier in the evening and also received IV Unasyn.After repositioning, pt was able to sleep for several hours.

## 2019-08-21 NOTE — Progress Notes (Signed)
Daily Progress Note   Patient Name: [name]       Date: 08/21/2019 DOB: 1920/04/12  Age: 86 y.o. MRN#: [id number] Attending Physician: Nolberto Hanlon, MD Primary Care Physician: Antony Contras, MD Admit Date: 08/16/2019  Reason for Consultation/Follow-up:  To discuss complex medical decision making related to patient's goals of care and provide emotional support  Conversed with Dr. Kurtis Bushman and bedside RN regarding patient.  Subjective: Patient appears fatigued today.  She is able to appropriately answer a few questions. Family was concerned about a tightness across the patient's upper abdomen as well as some involuntary jumping of extremities. Patient does not appear in distress and her abdomen is soft with active bowel sounds. Dr. Kurtis Bushman came to bedside and examined patient..  Patient's symptoms were discussed once again.  Gabapentin was added back.  Compazine administration was discussed.  There was an element of anxiety noted in both the family's expression as well as the medical team's.  I met with 2 of patient's daughters privately and provided space for them to express their feelings.   We talked about the fact that the dying process can be months long and the patient is somewhere in the dying process.  Family acknowledges this is true, but they do not want her to die of neglect or an inappropriate medical intervention.  I suggested that at this point in her life less medical intervention is better than more.  She is very fragile - if she continues to return to the medical system eventually a treatment is likely to cause her harm. (we get to a point where we make 1 thing better and something else worse at the same time).  Family acknowledges that this is true.  It is their intention to take her  home after the venting PEG and keep her at home with hospice services.  Dr. Radford Pax visited and provided cardiac recommendations.  Family was very Patent attorney. In conversing with the RN there is concern about whether or not placing a venting PEG would benefit the patient.  We may re-examine this idea on Sunday with the family.   Assessment: 86 yo female, very fragile, with high likelihood for decompensation.   Patient Profile/HPI:  86 y.o.femalewith past medical history of CKD, a fib, prior DVT s/p IVC filter, CHF, hypothyroidism, and multiple SBOadmitted on  8/2/2021with SBO d/t post-op adhesions.Now tolerating soft diet. Abd xray improving. Patient is also with pneumonia on IV antibiotics. PMT consulted for [place].  Length of Stay: 5   Vital Signs: BP (!) 133/51 (BP Location: Left Arm)   Pulse 68   Temp 97.8 F (36.6 C) (Oral)   Resp 15   SpO2 100%  SpO2: SpO2: 100 % O2 Device: O2 Device: Nasal Cannula O2 Flow Rate: O2 Flow Rate (L/min): 1 L/min       Palliative Assessment/Data: 20%     Palliative Care Plan    Recommendations/Plan:  PMT will continue to touch base to support the family and provide guidance relating to goals of care decisions  Plan to to go home with Hospice services after a venting PEG is placed.  Code Status:  DNR  Prognosis:   < 6 months Given recurrent SBO and advanced age along with degree of heart failure.  There is a plan for a comfort approach after discharge.  Discharge Planning:  Home with Hospice  Care plan was discussed with family, RN, attending MD  Thank you for allowing the Palliative Medicine Team to assist in the care of this patient.  Total time spent:  45 min.     Greater than 50%  of this time was spent counseling and coordinating care related to the above assessment and plan.  Florentina Jenny, PA-C Palliative Medicine  Please contact Palliative MedicineTeam phone at [phone] for questions and concerns  between 7 am - 7 pm.   Please see AMION for individual provider pager numbers.

## 2019-08-22 ENCOUNTER — Inpatient Hospital Stay (HOSPITAL_COMMUNITY): Payer: Medicare Other

## 2019-08-22 DIAGNOSIS — I480 Paroxysmal atrial fibrillation: Secondary | ICD-10-CM

## 2019-08-22 LAB — BASIC METABOLIC PANEL
Anion gap: 9 (ref 5–15)
BUN: 11 mg/dL (ref 8–23)
CO2: 28 mmol/L (ref 22–32)
Calcium: 8.3 mg/dL — ABNORMAL LOW (ref 8.9–10.3)
Chloride: 93 mmol/L — ABNORMAL LOW (ref 98–111)
Creatinine, Ser: 0.71 mg/dL (ref 0.44–1.00)
GFR calc Af Amer: >60 mL/min (ref >60)
GFR calc non Af Amer: >60 mL/min (ref >60)
Glucose, Bld: 102 mg/dL — ABNORMAL HIGH (ref 70–99)
Potassium: 3.8 mmol/L (ref 3.5–5.1)
Sodium: 130 mmol/L — ABNORMAL LOW (ref 135–145)

## 2019-08-22 LAB — BRAIN NATRIURETIC PEPTIDE: B Natriuretic Peptide: 420.5 pg/mL — ABNORMAL HIGH (ref 0.0–100.0)

## 2019-08-22 LAB — MAGNESIUM: Magnesium: 1.8 mg/dL (ref 1.7–2.4)

## 2019-08-22 MED ORDER — GABAPENTIN 100 MG PO CAPS
200.0000 mg | ORAL_CAPSULE | Freq: Every day | ORAL | Status: DC
  Administered 2019-08-22 – 2019-08-23 (×2): 200 mg via ORAL
  Filled 2019-08-22 (×2): qty 2

## 2019-08-22 MED ORDER — GABAPENTIN 100 MG PO CAPS
100.0000 mg | ORAL_CAPSULE | Freq: Two times a day (BID) | ORAL | Status: DC
  Administered 2019-08-22 – 2019-08-24 (×2): 100 mg via ORAL
  Filled 2019-08-22 (×2): qty 1

## 2019-08-22 NOTE — Progress Notes (Addendum)
PROGRESS NOTE    [name]  LSL:[id number] DOB: 22-Dec-1920 DOA: 08/16/2019 PCP: Antony Contras, MD    Brief Narrative:  Ms. [name]. Saia is a 86 year old female with past medical history of CKD stage III, chronic atrial fibrillation , prior DVT status post IVC filter placement, diastolic congestive heart failure(Echo 08/2018 EF 55-60%), hypothyroidism and multiple small bowel obstructions in the past(last in 03/2019 and 11/2016)who presented with complaints of abdominal pain nausea and vomiting.  Patient is a poor historian, daughter at the bedside, reported that the patient has been experiencing intermittent nausea for the past several weeks. patient is complaining of lower abdominal pain, dull in quality, radiating around the abdomen in a bandlike distribution, moderate to severe in intensity.  ED: CT imaging of the abdomen revealed evidence of small bowel obstruction with distal transition zone likely secondary to adhesions. Additionally, there is evidence of bronchiectasis with interstitial fibrosiswith areas of patchy airspace infiltrate possibly secondary to pneumonia or edema with a small left pleural effusion.     Consultants:   GI, palliative care.cardiology, gsx, neurology  Procedures: CT scan  Antimicrobials:   Unasyn 8/2   Subjective: All 3 daughters at bedside.  Per daughters pt had 2 loose stools today. No sob. Less nauseous per pt.   Objective: Vitals:   08/21/19 0900 08/21/19 1418 08/21/19 2044 08/22/19 0556  BP: (!) 187/69 (!) 133/51 (!) 154/69 (!) 159/73  Pulse: 67 68 70 70  Resp: 13 15 16 17   Temp: 97.9 F (36.6 C) 97.8 F (36.6 C) (!) 97.3 F (36.3 C) 98.4 F (36.9 C)  TempSrc: Oral Oral Oral Oral  SpO2: 96% 100% 100% 98%    Intake/Output Summary (Last 24 hours) at 08/22/2019 1610 Last data filed at 08/22/2019 1458 Gross per 24 hour  Intake 980.06 ml  Output 250 ml  Net 730.06 ml   There were no vitals filed for this  visit.  Examination:  General exam:in bed, comfortable, nad Respiratory system: bibasilar crackles , no w/rhonchi Cardiovascular system: RRR, s1/s2 2/6 sm, no gallop Gastrointestinal system: soft, nt/nd, +bs, no rebound  Central nervous system: awake and alert Extremities: no edmea no cyanosis Skin: warm, dry Psychiatry: mood and affect appropriate in current setting    Data Reviewed: I have personally reviewed following labs and imaging studies  CBC: Recent Labs  Lab 08/16/19 0117 08/17/19 0343 08/20/19 0252  WBC 15.6* 8.7 10.0  NEUTROABS 14.0* 7.1  --   HGB 14.0 10.0* 10.3*  HCT 41.4 30.2* 31.3*  MCV 98.3 98.1 97.2  PLT 307 258 115   Basic Metabolic Panel: Recent Labs  Lab 08/17/19 0343 08/17/19 0343 08/18/19 0926 08/19/19 0447 08/20/19 0252 08/21/19 1319 08/22/19 0144  NA 130*   < > 131* 132* 133* 133* 130*  K 4.2   < > 3.6 3.9 3.6 3.0* 3.8  CL 95*   < > 99 95* 94* 92* 93*  CO2 28   < > 25 26 27 29 28   GLUCOSE 94   < > 134* 96 84 109* 102*  BUN 15   < > 8 7* 8 13 11   CREATININE 0.99   < > 0.75 0.67 0.83 0.86 0.71  CALCIUM 8.2*   < > 8.4* 8.6* 8.2* 8.6* 8.3*  MG 2.0  --   --   --   --   --  1.8   < > = values in this interval not displayed.   GFR: Estimated Creatinine Clearance: 30.8 mL/min (by C-G  formula based on SCr of 0.71 mg/dL). Liver Function Tests: Recent Labs  Lab 08/16/19 0117 08/17/19 0343  AST 18 20  ALT 13 15  ALKPHOS 82 50  BILITOT 0.9 0.8  PROT 6.0* 4.7*  ALBUMIN 3.3* 2.5*   Recent Labs  Lab 08/16/19 0117  LIPASE 23   No results for input(s): AMMONIA in the last 168 hours. Coagulation Profile: Recent Labs  Lab 08/16/19 0540  INR 1.1   Cardiac Enzymes: No results for input(s): CKTOTAL, CKMB, CKMBINDEX, TROPONINI in the last 168 hours. BNP (last 3 results) No results for input(s): PROBNP in the last 8760 hours. HbA1C: No results for input(s): HGBA1C in the last 72 hours. CBG: Recent Labs  Lab 08/20/19 0710  GLUCAP  70   Lipid Profile: No results for input(s): CHOL, HDL, LDLCALC, TRIG, CHOLHDL, LDLDIRECT in the last 72 hours. Thyroid Function Tests: No results for input(s): TSH, T4TOTAL, FREET4, T3FREE, THYROIDAB in the last 72 hours. Anemia Panel: No results for input(s): VITAMINB12, FOLATE, FERRITIN, TIBC, IRON, RETICCTPCT in the last 72 hours. Sepsis Labs: No results for input(s): PROCALCITON, LATICACIDVEN in the last 168 hours.  Recent Results (from the past 240 hour(s))  SARS Coronavirus 2 by RT PCR (hospital order, performed in [facility] hospital lab) Nasopharyngeal Nasopharyngeal Swab     Status: None   Collection Time: 08/16/19  1:29 AM   Specimen: Nasopharyngeal Swab  Result Value Ref Range Status   SARS Coronavirus 2 NEGATIVE NEGATIVE Final    Comment: (NOTE) SARS-CoV-2 target nucleic acids are NOT DETECTED.  The SARS-CoV-2 RNA is generally detectable in upper and lower respiratory specimens during the acute phase of infection. The lowest concentration of SARS-CoV-2 viral copies this assay can detect is 250 copies / mL. A negative result does not preclude SARS-CoV-2 infection and should not be used as the sole basis for treatment or other patient management decisions.  A negative result may occur with improper specimen collection / handling, submission of specimen other than nasopharyngeal swab, presence of viral mutation(s) within the areas targeted by this assay, and inadequate number of viral copies (<250 copies / mL). A negative result must be combined with clinical observations, patient history, and epidemiological information.  Fact Sheet for Patients:   StrictlyIdeas.no  Fact Sheet for Healthcare Providers: BankingDealers.co.za  This test is not yet approved or  cleared by the [place] FDA and has been authorized for detection and/or diagnosis of SARS-CoV-2 by FDA under an Emergency Use Authorization (EUA).  This EUA  will remain in effect (meaning this test can be used) for the duration of the COVID-19 declaration under Section 564(b)(1) of the Act, 21 U.S.C. section 360bbb-3(b)(1), unless the authorization is terminated or revoked sooner.  Performed at [facility], [place] [address], [place], [place] [address]   Culture, blood (routine x 2)     Status: None   Collection Time: 08/16/19  5:25 AM   Specimen: BLOOD  Result Value Ref Range Status   Specimen Description BLOOD RIGHT ANTECUBITAL  Final   Special Requests   Final    BOTTLES DRAWN AEROBIC AND ANAEROBIC Blood Culture adequate volume   Culture   Final    NO GROWTH 5 DAYS Performed at [facility], [place] [address], [place], [place] [address]    Report Status 08/21/2019 FINAL  Final  Culture, blood (routine x 2)     Status: None   Collection Time: 08/16/19  5:26 AM   Specimen: BLOOD  Result  Value Ref Range Status   Specimen Description BLOOD LEFT ANTECUBITAL  Final   Special Requests   Final    BOTTLES DRAWN AEROBIC AND ANAEROBIC Blood Culture adequate volume   Culture   Final    NO GROWTH 5 DAYS Performed at [facility], [address], [place], [place] [address]    Report Status 08/21/2019 FINAL  Final         Radiology Studies: DG Chest 2 View  Result Date: 08/22/2019 CLINICAL DATA:  Dyspnea EXAM: CHEST - 2 VIEW COMPARISON:  08/19/2019 chest radiograph. FINDINGS: Right rotated chest radiograph stable cardiomediastinal silhouette with mild cardiomegaly. No pneumothorax. Stable small bilateral pleural effusions. Hazy patchy mild opacities throughout both lungs, improved. IMPRESSION: 1. Stable small bilateral pleural effusions. 2. Stable mild cardiomegaly. 3. Improved hazy patchy mild opacities throughout both lungs, either improving pneumonia or pulmonary edema. Electronically Signed   By: Ilona Sorrel M.D.   On: 08/22/2019 10:58        Scheduled Meds: . acetaminophen  1,000 mg Oral TID  . amiodarone  100 mg  Oral Daily  . amLODipine  2.5 mg Oral Daily  . artificial tears   Both Eyes QHS  . enoxaparin (LOVENOX) injection  40 mg Subcutaneous Q24H  . famotidine  20 mg Oral Daily  . lactobacillus acidophilus  2 tablet Oral TID  . lactose free nutrition  237 mL Oral TID WC  . levothyroxine  150 mcg Oral QAC breakfast  . lubiprostone  8 mcg Oral TID WC  . polyethylene glycol  17 g Oral Daily  . polyvinyl alcohol  1 drop Both Eyes QID  . predniSONE  5 mg Oral Daily   Continuous Infusions: . ampicillin-sulbactam (UNASYN) IV 3 g (08/22/19 1237)  . [START ON 08/23/2019]  ceFAZolin (ANCEF) IV      Assessment & Plan:   Principal Problem:   Small bowel obstruction due to postoperative adhesions Active Problems:   Aspiration pneumonia of both lower lobes due to gastric secretions (HCC)   Chronic diastolic heart failure (HCC)   Chronic kidney disease, stage 3a   Atrial fibrillation, chronic (HCC)   Hyponatremia   Adult failure to thrive   Goals of care, counseling/discussion   Other chronic pain   Nausea   Palliative care by specialist   DNR (do not resuscitate)   Protein-calorie malnutrition, severe   Pleural effusion   Palliative care encounter   Small bowel obstruction due to postoperative adhesions Patient suffering a recurrent small bowel obstruction, likely secondary to adhesions Patient is a longstanding known history of recurrent small bowel obstructions with previous hospitalizations in March 2021 and November 2018.During each of these hospitalizations no surgical intervention was pursued. Patient has refused surgical consultation, and NG tube placement-no active vomiting Consulted GI per family's request-input was appreicated- use reglan and compazine General surgery was consulted input was appreciated.  They recommended percutaneous gastrostomy tube via IR for symptomatic relief and reduce her risk of rehospitalization. Gen surgery okay with her eating and drinking the foods  that she enjoys Plan: NPO at [facility] tonight for G tube placement by IR Continue to hold Eliquis   Pneumonia of the bilateral lower lobes secondary to gastric secretions   Patchy bilateral lower lobe infiltrates on CT imaging  Continue bronchodilators nebs   bronchodilators bronchodilators  Blood cultures to date negative continue to monitor  cxr today with improvement Plan:   Continue Unasyn IV   Hyponatremia   Sodium 126 on admission >>>  127 >>130>>>131>>132>>>133  Now Na 130 today after lasix given  Initially likely secondary to poor oral intake and recent vomiting  Improved with IV fluid however now with vascular congestion, thus IV fluids discontinued  .  Then improved more with iv diuretics low dose of lasix. Now decreased to 130 due to diuretics  Hold on lasix prn .  Continue to monitor   Acute/Chronic diastolic heart failure (HCC) BNP initially in the 800s, decreased    On exam mildly volume overloaded, and pt was c/o sob. Was given lasix 20mg  iv x1 on 8/7 Continue I/o. Hold diuresis 2/2 low sodium. cxr with improvement. More euvolemic on exam, will continue to monitor Cards following    Hypokalemia- Replaced, stable. K 3/8 today   Chronic kidney disease, stage 3a   Creatinine on admission 1.23, likely prerenal.  Improved to baseline, now stable Plan: Continue to monitor periodically  Episodes of Unresponsive- Etiology unlcear. EEG no seizures. No arrhythmia on tele. She is somnolent possibly due to compazine and also pt telling me she is not sleeping much thus combo of all.  Spoke about this with family that pt on compazine prn and if they want to switch to something else as we dont have too many options . Not sure Reglan is good choice in this elderly pt. They have agreed to continue with compazine and they do understand that it may cause her to be sleepy.   Atrial fibrillation, chronic (HCC) -Temporarily holding home regimen of  Eliquis both in case of surgical intervention as well as in preparation for possible NG tube placement to avoid bleeding complications. Patient is typically on amiodarone 200 mg daily  Cards recommended decrease amiodarone to 100 mg daily Plan: Continue with amiodarone, resume Eliquis when ok by IR post procedure .    Hypoalbuminemia -Replaced albumin 25% x1 8/2//21 Continue to monitor  Failure to thrive, malnutrition -Due to poor p.o. intake, multiple comorbidities, and eluding recurrent SBO's, -BMI 18.4, weight 48.6 kg -currently NPO at this time -Consult nutrition for dietary supplements -Per daughters patient has confusion with Marinol Continue with Megace Currently on full liquid diet due to small bowel obstruction   Nutritional status:  Nutrition Problem: Predicted suboptimal nutrient intake Etiology: acute illness (SBO) Signs/Symptoms: percent weight loss Percent weight loss: 14.3 % Interventions: Ensure Enlive (each supplement provides 350kcal and 20 grams of protein), Magic cup, MVI    DVT prophylaxis: lovenox, scd Code Status:DNR Family Communication: Daughter is all updated at bedside, all questions and concerns were addressed.  Disposition Plan: Home Status is: Inpatient  Remains inpatient appropriate because:need diagnostic testing and treatment, and symptomatic still   Dispo: The patient is from: Home              Anticipated d/c is to: Home              Anticipated d/c date is: 2 days              Patient currently is not medically stable to d/c. Plan for IR gastrostomy tube on monday and then possible DC            LOS: 6 days   Time spent: 35 minutes with >50% on coc    Nolberto Hanlon, MD [facility] Pager [phone]  If 7PM-7AM, please contact night-coverage [email or web address] Password [email or web address] 08/22/2019, 4:10 PM

## 2019-08-22 NOTE — Progress Notes (Signed)
Daily Progress Note   Patient Name: [name]       Date: 08/22/2019 DOB: March 09, 1920  Age: 86 y.o. MRN#: [id number] Attending Physician: Nolberto Hanlon, MD Primary Care Physician: Antony Contras, MD Admit Date: 08/16/2019  Reason for Consultation/Follow-up: To discuss complex medical decision making related to patient's goals of care  Subjective: Patient is sitting in recliner chair.  She tells me she is not comfortable.  I ask her what hurts and she laughs at me and says "your kidding right?".  We giggle together and she talks about how tired her body is.  I tell her that a well used body is the best kind.  I spoke with Barbaraann Share and [name] about the venting PEG.  There are risks associated with the procedure - bleeding, infection, perforation and pain.  The family expresses that they have thought long and hard about this.  They do not want to do anything that will harm her or cause pain.  However, they describe her symptoms of daily nausea and sometimes vomiting that eventually lead to yet another SBO.  They describe terrible vomiting and now that they patient can't support her own body weight they have to hold her up while she vomits.  After some discussion they have convinced me that placing the PEG is the best way to keep her as comfortable as possible for as long as possible.  Barbaraann Share and [name] agonize over decisions making for their mother.  They know her prognosis is short.  Will she die before she has another SBO?  They continually question themselves.  I attempted to provide re-assurance that their mother is lucky to have such wonderful care from her 3 daughters.  They know her best and any decision they make will be the right decision.  We talk about what her mother should eat after  discharge.  I expressed that liquids (clears and fulls) are best, but she can have finely chopped foods and purees as well.  Anything that will be about to go thru the tube should her intestine become blocked.  I spent time with Barbaraann Share talking about Hospice and attempting to answer her questions to the best of my ability.  [place] was very helpful in speaking with Barbaraann Share as well.   Assessment: 86 yo female - recurrent SBO from  adhesions.  Not a surgical candidate.  SBO has now resolved.  Patient able to take clears and fulls.  For venting PEG tomorrow to prevent future symptoms.  Length of Stay: 6   Vital Signs: BP (!) 159/73 (BP Location: Left Arm)   Pulse 70   Temp 98.4 F (36.9 C) (Oral)   Resp 17   SpO2 98%  SpO2: SpO2: 98 % O2 Device: O2 Device: Nasal Cannula O2 Flow Rate: O2 Flow Rate (L/min): 1 L/min       Palliative Assessment/Data:  20%     Palliative Care Plan    Recommendations/Plan:  Venting PEG on 8/9  Home with Hospice afterward.  Family requests that the 3 daughters be able to see their mother immediately after the procedure (they are concerned something may happen to her).  Code Status:  DNR  Prognosis:   < 6 months   Discharge Planning:  Home with Hospice  Care plan was discussed with family.  Thank you for allowing the Palliative Medicine Team to assist in the care of this patient.  Total time spent:  60 min     Greater than 50%  of this time was spent counseling and coordinating care related to the above assessment and plan.  Florentina Jenny, PA-C Palliative Medicine  Please contact Palliative MedicineTeam phone at [phone] for questions and concerns between 7 am - 7 pm.   Please see AMION for individual provider pager numbers.

## 2019-08-22 NOTE — Progress Notes (Signed)
Progress Note  Patient Name: [name] Date of Encounter: 08/22/2019  Primary Cardiologist: Sinclair Grooms, MD  Subjective   Feeling better today after Lasix yesterday.  Denies any chest pain but still mildly SOB.  O2 sats 98% on 1 L O2  Inpatient Medications    Scheduled Meds: . acetaminophen  1,000 mg Oral TID  . amiodarone  100 mg Oral Daily  . amLODipine  2.5 mg Oral Daily  . artificial tears   Both Eyes QHS  . enoxaparin (LOVENOX) injection  40 mg Subcutaneous Q24H  . famotidine  20 mg Oral Daily  . lactobacillus acidophilus  2 tablet Oral TID  . lactose free nutrition  237 mL Oral TID WC  . levothyroxine  150 mcg Oral QAC breakfast  . lubiprostone  8 mcg Oral TID WC  . megestrol  400 mg Oral BID  . multivitamin with minerals  1 tablet Oral Daily  . polyethylene glycol  17 g Oral Daily  . polyvinyl alcohol  1 drop Both Eyes QID  . predniSONE  5 mg Oral Daily   Continuous Infusions: . ampicillin-sulbactam (UNASYN) IV 3 g (08/21/19 2304)  . [START ON 08/23/2019]  ceFAZolin (ANCEF) IV     PRN Meds: acetaminophen **OR** acetaminophen, ipratropium-albuterol, nitroGLYCERIN, prochlorperazine   Vital Signs    Vitals:   08/21/19 0900 08/21/19 1418 08/21/19 2044 08/22/19 0556  BP: (!) 187/69 (!) 133/51 (!) 154/69 (!) 159/73  Pulse: 67 68 70 70  Resp: 13 15 16 17   Temp: 97.9 F (36.6 C) 97.8 F (36.6 C) (!) 97.3 F (36.3 C) 98.4 F (36.9 C)  TempSrc: Oral Oral Oral Oral  SpO2: 96% 100% 100% 98%    Intake/Output Summary (Last 24 hours) at 08/22/2019 0815 Last data filed at 08/21/2019 1618 Gross per 24 hour  Intake 840.06 ml  Output 200 ml  Net 640.06 ml   Last 3 Weights 08/11/2019 04/01/2019 03/14/2019  Weight (lbs) 107 lb 3.2 oz 108 lb 113 lb 5.1 oz  Weight (kg) 48.626 kg 48.988 kg 51.4 kg     Telemetry    NSR - Personally Reviewed  Physical Exam   GEN: thin and frail HEENT: Normal NECK: No JVD; No carotid bruits LYMPHATICS: No  lymphadenopathy CARDIAC:RRR, no murmurs, rubs, gallops RESPIRATORY:  Clear to auscultation without rales, wheezing or rhonchi  ABDOMEN: Soft, non-tender, non-distended MUSCULOSKELETAL:  No edema; No deformity  SKIN: Warm and dry NEUROLOGIC:  Alert and oriented x 3 PSYCHIATRIC:  Normal affect    Labs    High Sensitivity Troponin:   Recent Labs  Lab 08/18/19 1339 08/18/19 1544  TROPONINIHS 83* 62*      Cardiac EnzymesNo results for input(s): TROPONINI in the last 168 hours. No results for input(s): TROPIPOC in the last 168 hours.   Chemistry Recent Labs  Lab 08/16/19 0117 08/16/19 0540 08/17/19 0343 08/18/19 0926 08/20/19 0252 08/21/19 1319 08/22/19 0144  NA 125*   < > 130*   < > 133* 133* 130*  K 4.5   < > 4.2   < > 3.6 3.0* 3.8  CL 90*   < > 95*   < > 94* 92* 93*  CO2 30   < > 28   < > 27 29 28   GLUCOSE 133*   < > 94   < > 84 109* 102*  BUN 14   < > 15   < > 8 13 11   CREATININE 0.96   < > 0.99   < >  0.83 0.86 0.71  CALCIUM 9.3   < > 8.2*   < > 8.2* 8.6* 8.3*  PROT 6.0*  --  4.7*  --   --   --   --   ALBUMIN 3.3*  --  2.5*  --   --   --   --   AST 18  --  20  --   --   --   --   ALT 13  --  15  --   --   --   --   ALKPHOS 82  --  50  --   --   --   --   BILITOT 0.9  --  0.8  --   --   --   --   GFRNONAA 50*   < > 48*   < > 59* 57* >60  GFRAA 57*   < > 55*   < > >60 >60 >60  ANIONGAP 5   < > 7   < > 12 12 9    < > = values in this interval not displayed.     Hematology Recent Labs  Lab 08/16/19 0117 08/17/19 0343 08/20/19 0252  WBC 15.6* 8.7 10.0  RBC 4.21 3.08* 3.22*  HGB 14.0 10.0* 10.3*  HCT 41.4 30.2* 31.3*  MCV 98.3 98.1 97.2  MCH 33.3 32.5 32.0  MCHC 33.8 33.1 32.9  RDW 12.7 13.0 12.6  PLT 307 258 267    BNP Recent Labs  Lab 08/18/19 1544 08/21/19 0919 08/22/19 0144  BNP 811.0* 321.6* 420.5*     DDimer No results for input(s): DDIMER in the last 168 hours.   Radiology    No results found.  Cardiac Studies   2D Echo 08/2018 1.  The left ventricle has normal systolic function, with an ejection  fraction of 55-60%. The cavity size was normal. There is moderately  increased left ventricular wall thickness. Left ventricular diastolic  Doppler parameters are consistent with  pseudonormalization. Elevated left ventricular end-diastolic pressure No  evidence of left ventricular regional wall motion abnormalities.  2. The right ventricle has normal systolic function. The cavity was  normal. There is no increase in right ventricular wall thickness. Right  ventricular systolic pressure could not be assessed.  3. Left atrial size was mildly dilated.  4. The pericardial effusion is localized near the right atrium and  anterior to the right ventricle.  5. Trivial pericardial effusion is present.  6. Mild thickening of the mitral valve leaflet. Mild calcification of the  mitral valve leaflet. There is moderate mitral annular calcification  present.  7. The aortic valve is tricuspid. Moderate sclerosis of the aortic valve.  Aortic valve regurgitation is mild by color flow Doppler.  8. The aorta is normal unless otherwise noted.  9. The interatrial septum appears to be lipomatous.   Patient Profile     86 y.o. female with paroxysmal atrial fibrillation on amiodarone, prior PE/DVT s/p IVC filter, chronic diastolic heart failure, CKD stage III, multiple SBO (03/2019, 11/2016) who was admitted with nausea, vomiting and SBO with leukocytosis. CT also showed infiltrate possibly secondary to PNA or edema with small left pleural effusion. She received IV hydration. Given leukocytosis, pt was started on IV antibiotics. Family at bedside requested cardiology consultfollowing a period of unresponsiveness 84/21 (not felt related to any cardiac issue), also raised question of possible CHF exacerbation.   Assessment & Plan   1. SBO and potential PNA superimposed on general FTT - per yesterday's  notes, general surgery plans  percutaneous gastrostomy tube via IR for symptomatic relief and reduce her risk of rehospitalization - regarding PNA, suspected aspiration from gastric secretions - further per primary teams  2. Period of unresponsiveness observed by family - details are unclear - neurology has been following - telemetry reviewed without dangerous arrhythmias during that time -- low suspicion that her heart contributed to this episode - hs troponins are low and flat, not felt to represent ACS (she has a history of troponin leak, per Epic) - no further intervention needed for this  3. Paroxysmal atrial fibrillation - maintaining NSR on tele - continue amiodarone to 100 mg daily (dose decreased this admission due to nausea ) - Eliquis resumed (on appropriate lower dose)  4. Possible mild acute on chronic diastolic CHF - BNP 010 on 8/5 with CXR showing small bilateral pleural effusions with bibasilar atelectasis, patchy bilateral opacities - s/p IV Lasix 20mg  x 1 on 8/5 and now BNP down to 321 -given another dose of Lasix 20mg  IV yesterday but I&O' are incomplete - trending hyponatremia as well, Na keeps going back and forth between 130 and 133 - in context of poor oral intake, would be hesitant to aggressively push diuresis further but she does appear mildly volume overloaded on exam with SOB -she has crackles at her bases but may be due to atelectasis -will check a PA and Lat Cxray today before giving any further Lasix -K+ stable at 3.8  5. Anemia - labs show Hgb historically in the 11-12 range, was 14 on admission in setting of acute illness, trending 10 -> 10.3 and relatively stable - monitoring per primary team  5. Essential HTN - BP improved and 159/27mmHg today.  Given her advanced age and frailty would not be aggressive with BP control - continue amlodipine (newly started)    For questions or updates, please contact [facility] Please consult [email or web address] for contact info under  Cardiology/STEMI.  Signed, Fransico Him, MD 08/22/2019, 8:15 AM     Patient seen and examined. Agree with assessment and plan.I/O ? +725 since admission. Maintaining NSR at 70. BS improved at bases but decreased more on R. Eliquis to be held for procedure on Mondayu.     Troy Sine, MD, [facility] 08/22/2019 8:15 AM

## 2019-08-22 NOTE — Progress Notes (Signed)
Manufacturing engineer [facility])  Hospital liaison spoke with daughter Barbaraann Share at length to answer questions regarding hospice services and to continue discussion of timeline and steps to initiating services after discharge.  Per discussion the plan is to discharge home after procedure planned for Monday, possibly Monday evening or Tuesday.   [place] send signed and completed DNR home with pt/family.  Please provide prescriptions at discharge as needed to ensure ongoing symptom management until patient can be admitted onto hospice service.    DME needs discussed.  At this time Barbaraann Share agrees with having hospital bed, supplemental O2 and a drop arm BSC delivered tomorrow.  Address has been verified and is correct in the chart.  Please call with any questions or concerns.  Thank you for the opportunity to participate in this pt's care.  Domenic Moras, BSN, RN [employer] [phone] (24h on call)

## 2019-08-23 ENCOUNTER — Inpatient Hospital Stay (HOSPITAL_COMMUNITY): Payer: Medicare Other

## 2019-08-23 HISTORY — PX: IR GASTROSTOMY TUBE MOD SED: IMG625

## 2019-08-23 LAB — BASIC METABOLIC PANEL
Anion gap: 8 (ref 5–15)
BUN: 15 mg/dL (ref 8–23)
CO2: 27 mmol/L (ref 22–32)
Calcium: 8.6 mg/dL — ABNORMAL LOW (ref 8.9–10.3)
Chloride: 95 mmol/L — ABNORMAL LOW (ref 98–111)
Creatinine, Ser: 0.79 mg/dL (ref 0.44–1.00)
GFR calc Af Amer: >60 mL/min (ref >60)
GFR calc non Af Amer: >60 mL/min (ref >60)
Glucose, Bld: 90 mg/dL (ref 70–99)
Potassium: 4.1 mmol/L (ref 3.5–5.1)
Sodium: 130 mmol/L — ABNORMAL LOW (ref 135–145)

## 2019-08-23 MED ORDER — FENTANYL CITRATE (PF) 100 MCG/2ML IJ SOLN
INTRAMUSCULAR | Status: AC | PRN
  Administered 2019-08-23: 25 ug via INTRAVENOUS

## 2019-08-23 MED ORDER — CEFAZOLIN SODIUM-DEXTROSE 2-4 GM/100ML-% IV SOLN: 2.0000 g | Freq: Once | INTRAVENOUS | Status: AC

## 2019-08-23 MED ORDER — IOHEXOL 300 MG/ML  SOLN
50.0000 mL | Freq: Once | INTRAMUSCULAR | Status: AC | PRN
  Administered 2019-08-23: 10 mL

## 2019-08-23 MED ORDER — LIDOCAINE HCL 1 % IJ SOLN
INTRAMUSCULAR | Status: AC
  Filled 2019-08-23: qty 20

## 2019-08-23 MED ORDER — LIDOCAINE HCL 1 % IJ SOLN
INTRAMUSCULAR | Status: AC | PRN
  Administered 2019-08-23: 10 mL

## 2019-08-23 MED ORDER — CEFAZOLIN SODIUM-DEXTROSE 2-4 GM/100ML-% IV SOLN
INTRAVENOUS | Status: AC
  Administered 2019-08-23: 2 g
  Filled 2019-08-23: qty 100

## 2019-08-23 MED ORDER — FENTANYL CITRATE (PF) 100 MCG/2ML IJ SOLN
INTRAMUSCULAR | Status: AC
  Filled 2019-08-23: qty 2

## 2019-08-23 MED ORDER — MIDAZOLAM HCL 2 MG/2ML IJ SOLN
INTRAMUSCULAR | Status: AC
  Filled 2019-08-23: qty 2

## 2019-08-23 MED ORDER — MIDAZOLAM HCL 2 MG/2ML IJ SOLN
INTRAMUSCULAR | Status: AC | PRN
  Administered 2019-08-23: 0.5 mg via INTRAVENOUS

## 2019-08-23 NOTE — Progress Notes (Signed)
Spoke to patient's daughter Barbaraann Share via telephone regarding gastrostomy tube placement for decompression purposes. All questions were answered and family is in agreement with procedure. Patient tentatively scheduled for 8.9.21.  Team instructed to: Keep Patient to be NPO after midnight Hold prophylactic anticoagulation 24 hours prior to scheduled procedure.  IR will call patient when ready.

## 2019-08-23 NOTE — Progress Notes (Signed)
Patient's family would like to speak with someone in IR before the procedure today.  They have questions.  I am not sure what time the procedure will be so will have day shift to follow up.

## 2019-08-23 NOTE — Progress Notes (Signed)
Patients daughter in the room crying, upset because she is wanting to know if the patient will be started back on a diet tonight or if she will have to wait until tomorrow.  I have reviewed the IR recovery orders but they state to not use the G tube for medications or feedings until tomorrow 08/24/19 1600 but no information about resuming a diet.  I have spoken with Dr. Kurtis Bushman and she prefers that we discuss the diet order with surgery or IR.  I have called the IR office number provided in the order post procedure and the nurse I spoke with provided [phone], this number was paged but never returned. Will continue to explain to the patient and daughter that as of right now the patient has no diet orders but will continue to get in touch with a provider to see if that can be changed.

## 2019-08-23 NOTE — Progress Notes (Addendum)
Received a call from [name] (Daughter and [facility]).  Ms. Truman Hayward is concerned that she may not be allowed to spend the night with her mother this evening.  Mrs. Dehaan is a 86 yo female who is nearing end of life.   She is unable to press the call button herself or express her needs.  I believe she falls with in the "exceptions" part of the [facility] visitation policy in that she is unable to make her needs known.  [name] is asking to be able to spend the night with her mother.  This seems very appropriate.   The patient is going home after the procedure with Hospice services and is likely near end of life.    Florentina Jenny, PA-C Palliative Medicine Office:  [phone]

## 2019-08-23 NOTE — Progress Notes (Signed)
PT Cancellation Note  Patient Details Name: [name] MRN: [id number] DOB: 15-Apr-1920   Cancelled Treatment:    Reason Eval/Treat Not Completed: Patient at procedure or test/unavailable (G tube placement).    Wyona Almas, PT, DPT Acute Rehabilitation Services Pager [phone] Office [phone]    Deno Etienne 08/23/2019, 3:29 PM

## 2019-08-23 NOTE — Progress Notes (Signed)
Progress Note  Patient Name: [name] Date of Encounter: 08/23/2019  Primary Cardiologist: Sinclair Grooms, MD  Subjective   Daughter notes patient had a good day yesterday with crossword puzzles and this morning she is more sleepy again. Appears to be resting comfortably in no acute distress.  Inpatient Medications    Scheduled Meds: . acetaminophen  1,000 mg Oral TID  . amiodarone  100 mg Oral Daily  . amLODipine  2.5 mg Oral Daily  . artificial tears   Both Eyes QHS  . famotidine  20 mg Oral Daily  . gabapentin  100 mg Oral BID  . gabapentin  200 mg Oral QHS  . lactobacillus acidophilus  2 tablet Oral TID  . lactose free nutrition  237 mL Oral TID WC  . levothyroxine  150 mcg Oral QAC breakfast  . lubiprostone  8 mcg Oral TID WC  . polyethylene glycol  17 g Oral Daily  . polyvinyl alcohol  1 drop Both Eyes QID  . predniSONE  5 mg Oral Daily   Continuous Infusions: . ampicillin-sulbactam (UNASYN) IV 3 g (08/22/19 2249)  .  ceFAZolin (ANCEF) IV     PRN Meds: acetaminophen **OR** acetaminophen, ipratropium-albuterol, nitroGLYCERIN, prochlorperazine   Vital Signs    Vitals:   08/21/19 2044 08/22/19 0556 08/22/19 2106 08/23/19 0525  BP: (!) 154/69 (!) 159/73 (!) 146/91 (!) 161/60  Pulse: 70 70 75 66  Resp: 16 17 15 15   Temp: (!) 97.3 F (36.3 C) 98.4 F (36.9 C) 98.1 F (36.7 C) 97.7 F (36.5 C)  TempSrc: Oral Oral Oral Oral  SpO2: 100% 98% 100% 100%    Intake/Output Summary (Last 24 hours) at 08/23/2019 0906 Last data filed at 08/23/2019 0805 Gross per 24 hour  Intake 480 ml  Output 700 ml  Net -220 ml   Last 3 Weights 08/11/2019 04/01/2019 03/14/2019  Weight (lbs) 107 lb 3.2 oz 108 lb 113 lb 5.1 oz  Weight (kg) 48.626 kg 48.988 kg 51.4 kg     Telemetry    NSR - Personally Reviewed  Physical Exam   GEN: No acute distress, frail, elderly HEENT: Normocephalic, atraumatic, sclera non-icteric. Neck: No JVD or bruits. Cardiac: RRR no murmurs, rubs,  or gallops.  Radials/DP/PT 1+ and equal bilaterally.  Respiratory: Mildly diminished at bases. Breathing is unlabored. GI: Soft, nontender, non-distended, BS +x 4. MS: no deformity. Extremities: No clubbing or cyanosis. No edema. Distal pedal pulses are 2+ and equal bilaterally. Neuro:  Sleeping comfortably Psych:  Not assessed  Labs    High Sensitivity Troponin:   Recent Labs  Lab 08/18/19 1339 08/18/19 1544  TROPONINIHS 83* 62*      Cardiac EnzymesNo results for input(s): TROPONINI in the last 168 hours. No results for input(s): TROPIPOC in the last 168 hours.   Chemistry Recent Labs  Lab 08/17/19 0343 08/18/19 0926 08/20/19 0252 08/21/19 1319 08/22/19 0144  NA 130*   < > 133* 133* 130*  K 4.2   < > 3.6 3.0* 3.8  CL 95*   < > 94* 92* 93*  CO2 28   < > 27 29 28   GLUCOSE 94   < > 84 109* 102*  BUN 15   < > 8 13 11   CREATININE 0.99   < > 0.83 0.86 0.71  CALCIUM 8.2*   < > 8.2* 8.6* 8.3*  PROT 4.7*  --   --   --   --   ALBUMIN 2.5*  --   --   --   --  AST 20  --   --   --   --   ALT 15  --   --   --   --   ALKPHOS 50  --   --   --   --   BILITOT 0.8  --   --   --   --   GFRNONAA 48*   < > 59* 57* >60  GFRAA 55*   < > >60 >60 >60  ANIONGAP 7   < > 12 12 9    < > = values in this interval not displayed.     Hematology Recent Labs  Lab 08/17/19 0343 08/20/19 0252  WBC 8.7 10.0  RBC 3.08* 3.22*  HGB 10.0* 10.3*  HCT 30.2* 31.3*  MCV 98.1 97.2  MCH 32.5 32.0  MCHC 33.1 32.9  RDW 13.0 12.6  PLT 258 267    BNP Recent Labs  Lab 08/18/19 1544 08/21/19 0919 08/22/19 0144  BNP 811.0* 321.6* 420.5*     DDimer No results for input(s): DDIMER in the last 168 hours.   Radiology    DG Chest 2 View  Result Date: 08/22/2019 CLINICAL DATA:  Dyspnea EXAM: CHEST - 2 VIEW COMPARISON:  08/19/2019 chest radiograph. FINDINGS: Right rotated chest radiograph stable cardiomediastinal silhouette with mild cardiomegaly. No pneumothorax. Stable small bilateral pleural  effusions. Hazy patchy mild opacities throughout both lungs, improved. IMPRESSION: 1. Stable small bilateral pleural effusions. 2. Stable mild cardiomegaly. 3. Improved hazy patchy mild opacities throughout both lungs, either improving pneumonia or pulmonary edema. Electronically Signed   By: Ilona Sorrel M.D.   On: 08/22/2019 10:58    Cardiac Studies   2D Echo 08/2018 1. The left ventricle has normal systolic function, with an ejection  fraction of 55-60%. The cavity size was normal. There is moderately  increased left ventricular wall thickness. Left ventricular diastolic  Doppler parameters are consistent with  pseudonormalization. Elevated left ventricular end-diastolic pressure No  evidence of left ventricular regional wall motion abnormalities.  2. The right ventricle has normal systolic function. The cavity was  normal. There is no increase in right ventricular wall thickness. Right  ventricular systolic pressure could not be assessed.  3. Left atrial size was mildly dilated.  4. The pericardial effusion is localized near the right atrium and  anterior to the right ventricle.  5. Trivial pericardial effusion is present.  6. Mild thickening of the mitral valve leaflet. Mild calcification of the  mitral valve leaflet. There is moderate mitral annular calcification  present.  7. The aortic valve is tricuspid. Moderate sclerosis of the aortic valve.  Aortic valve regurgitation is mild by color flow Doppler.  8. The aorta is normal unless otherwise noted.  9. The interatrial septum appears to be lipomatous.  Patient Profile     86 y.o.femalewithparoxysmal atrial fibrillation on amiodarone, prior PE/DVT s/p IVC filter, chronic diastolic heart failure, CKD stage III, multiple SBO (03/2019, 11/2016) who was admitted with nausea, vomiting and SBO with leukocytosis.CT also showed infiltrate possibly secondary to PNA or edema with small left pleural effusion. She received IV  hydration. Given leukocytosis, pt was started onIV antibiotics.Family at bedside requested cardiology consultfollowing a period of decreased responsiveness8/4/21 (not felt related to any cardiac issue), also raised question of possible CHF exacerbation.Family very involved at bedside and palliative care involved.  Assessment & Plan    1. SBO and potential PNA superimposed on general FTT - per notes general surgery plans percutaneous gastrostomy tube via IR for symptomatic  relief and reduce her risk of rehospitalization - regarding PNA, suspected aspiration from gastric secretions - further per primary teams  2. Period of unresponsiveness observed by family -neurology has been following - telemetry reviewed without dangerous arrhythmias during that time-- low suspicion for cardiac contribution - hs troponinsare low and flat, not felt to represent ACS (she has a history of troponin leak, per Epic)- no further intervention needed for this from our standpoint - gabapentin decreased  3.Paroxysmal atrial fibrillation - maintaining NSR on tele - continueamiodarone to 100 mg daily (dose decreased this admission due to nausea ) - Eliquis on hold for IR procedure  4. Possible mild acute on chronic diastolic CHF - I/O's not trended fully this admission so not likely to be accurate - s/p IV Lasix 20mg  on 8/5 and 8/7 - trending hyponatremia as well, fluctuating daily - in context of poor oral intake, would be hesitant to aggressively push diuresis - f/u CXR yesterday with small bilateral pleural effusions, mild cardiomegaly, hazy patchy mild opacities throughout both lungs, either improving pneumonia or pulmonary edema - low albumin also likely contributing to small effusions along with PNA - BMET pending today - will review with MD  5. Anemia - labs show Hgb historically in the 11-12 range, was 14 on admission in setting of acute illness, trending 10 -> 10.3 and relatively stable -  monitoring per primary team  5. Essential HTN - given her advanced age and frailty would not be aggressive with BP control - can consider titration of amlodipine to 5mg  daily if BP remains elevated   For questions or updates, please contact [facility] Please consult [email or web address] for contact info under Cardiology/STEMI.  Signed, Charlie Pitter, PA-C 08/23/2019, 9:06 AM

## 2019-08-23 NOTE — Progress Notes (Signed)
Nutrition Follow-up  DOCUMENTATION CODES:   Severe malnutrition in context of chronic illness, Underweight  INTERVENTION:   -Once diet is advanced, continue PO diet and supplements for comfort  NUTRITION DIAGNOSIS:   Severe Malnutrition related to chronic illness (CHF, CKD, history of bowel obstructions) as evidenced by moderate fat depletion, severe fat depletion, moderate muscle depletion, severe muscle depletion, percent weight loss.  Ongoing  GOAL:   Patient will meet greater than or equal to 90% of their needs  Unmet  MONITOR:   PO intake, Supplement acceptance, Diet advancement, Labs, Weight trends, Skin, I & O's  REASON FOR ASSESSMENT:   Malnutrition Screening Tool, Consult Assessment of nutrition requirement/status  ASSESSMENT:   86 year old female with past medical history of chronic kidney disease stage III, chronic atrial fibrillation, prior DVT status post IVC filter placement, diastolic congestive heart failure (Echo 08/2018 EF 55-60%), hypothyroidism and multiple small bowel obstructions in the past (last in 03/2019 and 11/2016) who presents to [facility] emergency department with complaints of abdominal pain nausea and vomiting.  8/2- advanced to clear liquid diet 8/3- advanced to soft diet  Reviewed I/O's: +80 ml x 24 hours and +1.1 L x 24 hours  UOP: 400 ml x 24 hours  Pt NPO for venting g-tube placement today. Per palliative care notes, plan to discharge home with home hospice services today or tomorrow following procedure. Per last palliative note, pt is nearing end of life.   Labs reviewed: Na: 130.   Diet Order:   Diet Order            Diet NPO time specified  Diet effective midnight                 EDUCATION NEEDS:   No education needs have been identified at this time  Skin:  Skin Assessment: Skin Integrity Issues: Skin Integrity Issues:: Incisions Incisions: rt hip  Last BM:  08/22/19  Height:   Ht Readings from Last 1  Encounters:  08/11/19 5\' 4"  (1.626 m)    Weight:   Wt Readings from Last 1 Encounters:  08/11/19 48.6 kg    Ideal Body Weight:  54.5 kg  BMI:  There is no height or weight on file to calculate BMI.  Estimated Nutritional Needs:   Kcal:  1450-1650  Protein:  65-80 grams  Fluid:  > 1.4 L    Loistine Chance, RD, LDN, [place] Registered Dietitian II Certified Diabetes Care and Education Specialist Please refer to [facility] for RD and/or RD on-call/weekend/after hours pager

## 2019-08-23 NOTE — Progress Notes (Signed)
PROGRESS NOTE    [name]  TFT:[id number] DOB: 12-18-1920 DOA: 08/16/2019 PCP: Antony Contras, MD    Brief Narrative:  Ms. Brittany Faulk. Hamilton is a 86 year old female with past medical history of CKD stage III, chronic atrial fibrillation , prior DVT status post IVC filter placement, diastolic congestive heart failure(Echo 08/2018 EF 55-60%), hypothyroidism and multiple small bowel obstructions in the past(last in 03/2019 and 11/2016)who presented with complaints of abdominal pain nausea and vomiting.  Patient is a poor historian, daughter at the bedside, reported that the patient has been experiencing intermittent nausea for the past several weeks. patient is complaining of lower abdominal pain, dull in quality, radiating around the abdomen in a bandlike distribution, moderate to severe in intensity.  ED: CT imaging of the abdomen revealed evidence of small bowel obstruction with distal transition zone likely secondary to adhesions. Additionally, there is evidence of bronchiectasis with interstitial fibrosiswith areas of patchy airspace infiltrate possibly secondary to pneumonia or edema with a small left pleural effusion.     Consultants:   GI, palliative care.cardiology, gsx, neurology  Procedures: CT scan  Antimicrobials:   Unasyn 8/2   Subjective: 1 daughter at bedside.  Patient apparently had no overnight events slept through the night.  No complaints today.  Objective: Vitals:   08/21/19 2044 08/22/19 0556 08/22/19 2106 08/23/19 0525  BP: (!) 154/69 (!) 159/73 (!) 146/91 (!) 161/60  Pulse: 70 70 75 66  Resp: 16 17 15 15   Temp: (!) 97.3 F (36.3 C) 98.4 F (36.9 C) 98.1 F (36.7 C) 97.7 F (36.5 C)  TempSrc: Oral Oral Oral Oral  SpO2: 100% 98% 100% 100%    Intake/Output Summary (Last 24 hours) at 08/23/2019 1555 Last data filed at 08/23/2019 1208 Gross per 24 hour  Intake 100 ml  Output 950 ml  Net -850 ml   There were no vitals filed for this  visit.  Examination:  General exam: Sleeping in fetal position, NAD Respiratory system: Dry bibasilar crackles no wheeze rales rhonchi's Cardiovascular system: RRR, s1/s2 2/6 sm, no gallops or rubs Gastrointestinal system: soft, nt/nd, positive bowel sounds but decreased no rebound or guarding Central nervous system: Sleepy somewhat answers questions but unable to assess Extremities: no edmea no cyanosis Skin: warm, dry Psychiatry: Unable to assess but appears mood and affect similar to other days    Data Reviewed: I have personally reviewed following labs and imaging studies  CBC: Recent Labs  Lab 08/17/19 0343 08/20/19 0252  WBC 8.7 10.0  NEUTROABS 7.1  --   HGB 10.0* 10.3*  HCT 30.2* 31.3*  MCV 98.1 97.2  PLT 258 706   Basic Metabolic Panel: Recent Labs  Lab 08/17/19 0343 08/18/19 0926 08/19/19 0447 08/20/19 0252 08/21/19 1319 08/22/19 0144 08/23/19 1029  NA 130*   < > 132* 133* 133* 130* 130*  K 4.2   < > 3.9 3.6 3.0* 3.8 4.1  CL 95*   < > 95* 94* 92* 93* 95*  CO2 28   < > 26 27 29 28 27   GLUCOSE 94   < > 96 84 109* 102* 90  BUN 15   < > 7* 8 13 11 15   CREATININE 0.99   < > 0.67 0.83 0.86 0.71 0.79  CALCIUM 8.2*   < > 8.6* 8.2* 8.6* 8.3* 8.6*  MG 2.0  --   --   --   --  1.8  --    < > = values in this interval not  displayed.   GFR: Estimated Creatinine Clearance: 30.8 mL/min (by C-G formula based on SCr of 0.79 mg/dL). Liver Function Tests: Recent Labs  Lab 08/17/19 0343  AST 20  ALT 15  ALKPHOS 50  BILITOT 0.8  PROT 4.7*  ALBUMIN 2.5*   No results for input(s): LIPASE, AMYLASE in the last 168 hours. No results for input(s): AMMONIA in the last 168 hours. Coagulation Profile: No results for input(s): INR, PROTIME in the last 168 hours. Cardiac Enzymes: No results for input(s): CKTOTAL, CKMB, CKMBINDEX, TROPONINI in the last 168 hours. BNP (last 3 results) No results for input(s): PROBNP in the last 8760 hours. HbA1C: No results for input(s):  HGBA1C in the last 72 hours. CBG: Recent Labs  Lab 08/20/19 0710  GLUCAP 70   Lipid Profile: No results for input(s): CHOL, HDL, LDLCALC, TRIG, CHOLHDL, LDLDIRECT in the last 72 hours. Thyroid Function Tests: No results for input(s): TSH, T4TOTAL, FREET4, T3FREE, THYROIDAB in the last 72 hours. Anemia Panel: No results for input(s): VITAMINB12, FOLATE, FERRITIN, TIBC, IRON, RETICCTPCT in the last 72 hours. Sepsis Labs: No results for input(s): PROCALCITON, LATICACIDVEN in the last 168 hours.  Recent Results (from the past 240 hour(s))  SARS Coronavirus 2 by RT PCR (hospital order, performed in [facility] hospital lab) Nasopharyngeal Nasopharyngeal Swab     Status: None   Collection Time: 08/16/19  1:29 AM   Specimen: Nasopharyngeal Swab  Result Value Ref Range Status   SARS Coronavirus 2 NEGATIVE NEGATIVE Final    Comment: (NOTE) SARS-CoV-2 target nucleic acids are NOT DETECTED.  The SARS-CoV-2 RNA is generally detectable in upper and lower respiratory specimens during the acute phase of infection. The lowest concentration of SARS-CoV-2 viral copies this assay can detect is 250 copies / mL. A negative result does not preclude SARS-CoV-2 infection and should not be used as the sole basis for treatment or other patient management decisions.  A negative result may occur with improper specimen collection / handling, submission of specimen other than nasopharyngeal swab, presence of viral mutation(s) within the areas targeted by this assay, and inadequate number of viral copies (<250 copies / mL). A negative result must be combined with clinical observations, patient history, and epidemiological information.  Fact Sheet for Patients:   StrictlyIdeas.no  Fact Sheet for Healthcare Providers: BankingDealers.co.za  This test is not yet approved or  cleared by the [place] FDA and has been authorized for detection and/or diagnosis  of SARS-CoV-2 by FDA under an Emergency Use Authorization (EUA).  This EUA will remain in effect (meaning this test can be used) for the duration of the COVID-19 declaration under Section 564(b)(1) of the Act, 21 U.S.C. section 360bbb-3(b)(1), unless the authorization is terminated or revoked sooner.  Performed at [facility], [place] [address], [place], [place] [address]   Culture, blood (routine x 2)     Status: None   Collection Time: 08/16/19  5:25 AM   Specimen: BLOOD  Result Value Ref Range Status   Specimen Description BLOOD RIGHT ANTECUBITAL  Final   Special Requests   Final    BOTTLES DRAWN AEROBIC AND ANAEROBIC Blood Culture adequate volume   Culture   Final    NO GROWTH 5 DAYS Performed at [facility], [place] [address], [place], [place] [address]    Report Status 08/21/2019 FINAL  Final  Culture, blood (routine x 2)     Status: None   Collection Time: 08/16/19  5:26 AM   Specimen:  BLOOD  Result Value Ref Range Status   Specimen Description BLOOD LEFT ANTECUBITAL  Final   Special Requests   Final    BOTTLES DRAWN AEROBIC AND ANAEROBIC Blood Culture adequate volume   Culture   Final    NO GROWTH 5 DAYS Performed at [facility], [address], [place], [place] [address]    Report Status 08/21/2019 FINAL  Final         Radiology Studies: DG Chest 2 View  Result Date: 08/22/2019 CLINICAL DATA:  Dyspnea EXAM: CHEST - 2 VIEW COMPARISON:  08/19/2019 chest radiograph. FINDINGS: Right rotated chest radiograph stable cardiomediastinal silhouette with mild cardiomegaly. No pneumothorax. Stable small bilateral pleural effusions. Hazy patchy mild opacities throughout both lungs, improved. IMPRESSION: 1. Stable small bilateral pleural effusions. 2. Stable mild cardiomegaly. 3. Improved hazy patchy mild opacities throughout both lungs, either improving pneumonia or pulmonary edema. Electronically Signed   By: Ilona Sorrel M.D.   On: 08/22/2019 10:58         Scheduled Meds: . acetaminophen  1,000 mg Oral TID  . amiodarone  100 mg Oral Daily  . amLODipine  2.5 mg Oral Daily  . artificial tears   Both Eyes QHS  . famotidine  20 mg Oral Daily  . gabapentin  100 mg Oral BID  . gabapentin  200 mg Oral QHS  . lactobacillus acidophilus  2 tablet Oral TID  . lactose free nutrition  237 mL Oral TID WC  . levothyroxine  150 mcg Oral QAC breakfast  . lidocaine      . lubiprostone  8 mcg Oral TID WC  . polyethylene glycol  17 g Oral Daily  . polyvinyl alcohol  1 drop Both Eyes QID  . predniSONE  5 mg Oral Daily   Continuous Infusions: . ampicillin-sulbactam (UNASYN) IV 3 g (08/23/19 1032)  .  ceFAZolin (ANCEF) IV      Assessment & Plan:   Principal Problem:   Small bowel obstruction due to postoperative adhesions Active Problems:   Aspiration pneumonia of both lower lobes due to gastric secretions (HCC)   Chronic diastolic heart failure (HCC)   Chronic kidney disease, stage 3a   Atrial fibrillation, chronic (HCC)   Hyponatremia   Adult failure to thrive   Goals of care, counseling/discussion   Other chronic pain   Nausea   Palliative care by specialist   DNR (do not resuscitate)   Protein-calorie malnutrition, severe   Pleural effusion   Palliative care encounter   Small bowel obstruction due to postoperative adhesions Patient suffering a recurrent small bowel obstruction, likely secondary to adhesions Patient is a longstanding known history of recurrent small bowel obstructions with previous hospitalizations in March 2021 and November 2018.During each of these hospitalizations no surgical intervention was pursued. Patient has refused surgical consultation, and NG tube placement-no active vomiting Consulted GI per family's request-input was appreicated- use reglan and compazine General surgery was consulted input was appreciated.  They recommended percutaneous gastrostomy tube via IR for symptomatic relief and reduce  her risk of rehospitalization. Gen surgery okay with her eating and drinking the foods that she enjoys Plan: Plan for G-tube by IR today Continue to hold Eliquis and resume when cleared by IR    Pneumonia of the bilateral lower lobes secondary to gastric secretions   Patchy bilateral lower lobe infiltrates on CT imaging  Continue bronchodilators nebs   bronchodilators bronchodilators  Blood cultures to date negative continue to monitor  Repeat chest x-ray with  improvement   Completed antibiotic course, will discontinue Unasyn today    Hyponatremia   Sodium 126 on admission >>> 127 >>130>>>131>>132>>>133  Now Na 130  after lasix given on 8/8, today at 130  Initially likely secondary to poor oral intake and recent vomiting  Improved with IV fluid however now with vascular congestion, thus IV fluids discontinued  .  Then improved more with iv diuretics low dose of lasix.   Hold on lasix prn .  Spoke to daughter, they understand pt is at "end of life" and going home with hospice.  Daughter states all the family has spoken to each other and they do understand and they are acceptable if patient passes away.  So for now I will not check sodium level.   Acute/Chronic diastolic heart failure (HCC) BNP initially in the 800s, decreased    On exam mildly volume overloaded, and pt was c/o sob. Was given lasix 20mg  iv x1 on 8/7 Continue I/o. Hold diuresis 2/2 low sodium. cxr with improvement. We will hold off on Lasix due to hyponatremia.   Euvolemic. Cards signed off.     Hypokalemia- Replaced, stable. K 4.1 today.   Chronic kidney disease, stage 3a   Creatinine on admission 1.23, likely prerenal.  At baseline.improved with ivf.   Episodes of Unresponsive- Etiology unlcear. EEG no seizures. No arrhythmia on tele. She is somnolent possibly due to compazine and also pt telling me she is not sleeping much thus combo of all.  Spoke about this with family  that pt on compazine prn and if they want to switch to something else as we dont have too many options . Not sure Reglan is good choice in this elderly pt. They have agreed to continue with compazine and they do understand that it may cause her to be sleepy.   Atrial fibrillation, chronic (HCC) -Temporarily holding home regimen of Eliquis both in case of surgical intervention as well as in preparation for possible NG tube placement to avoid bleeding complications. Patient is typically on amiodarone 200 mg daily  Cards recommended decrease amiodarone to 100 mg daily Plan: .  Continue with amiodarone  Continue Eliquis when cleared by IR.    Hypoalbuminemia -Replaced albumin 25% x1 8/2//21 Continue to monitor  Failure to thrive, malnutrition -Due to poor p.o. intake, multiple comorbidities, and eluding recurrent SBO's, -BMI 18.4, weight 48.6 kg -currently NPO at this time -Consult nutrition for dietary supplements -Per daughters patient has confusion with Marinol Continue with Megace Getting Gtube, nutrion following. Full liquid as tolerated for now.   Nutritional status:  Nutrition Problem: Predicted suboptimal nutrient intake Etiology: acute illness (SBO) Signs/Symptoms: percent weight loss Percent weight loss: 14.3 % Interventions: Ensure Enlive (each supplement provides 350kcal and 20 grams of protein), Magic cup, MVI    DVT prophylaxis: lovenox, scd Code Status:DNR Family Communication: Daughter updated at bedside questions and concerns were addressed. Nsg supervisor at bedside with [place]. Disposition Plan: Home Status is: Inpatient  Remains inpatient appropriate because:need diagnostic testing and treatment, and symptomatic still   Dispo: The patient is from: Home              Anticipated d/c is to: Home              Anticipated d/c date is: 1 day              Patient currently is not medically stable to d/c. Plan for IR gastrostomy tube on monday and then possible  DC  LOS: 7 days   Time spent: 35 minutes with >50% on coc    Nolberto Hanlon, MD [facility] Pager [phone]  If 7PM-7AM, please contact night-coverage [email or web address] Password [email or web address] 08/23/2019, 3:55 PM

## 2019-08-23 NOTE — Progress Notes (Signed)
[facility] paged to inform patient is NPO for Gtube placement no orders patient has medications ordered po and no diet was ordered. Arthor Captain LPN

## 2019-08-23 NOTE — Procedures (Signed)
Interventional Radiology Procedure Note  Procedure: Percutaneous gastrostomy tube placement  Complications: None  Estimated Blood Loss: < 10 mL  Findings: 20 Fr bumper retention gastrostomy tube placed with tip in body of stomach.   [name]. Kathlene Cote, M.D Pager:  [phone]

## 2019-08-24 LAB — BASIC METABOLIC PANEL
Anion gap: 12 (ref 5–15)
BUN: 17 mg/dL (ref 8–23)
CO2: 25 mmol/L (ref 22–32)
Calcium: 8.7 mg/dL — ABNORMAL LOW (ref 8.9–10.3)
Chloride: 95 mmol/L — ABNORMAL LOW (ref 98–111)
Creatinine, Ser: 0.87 mg/dL (ref 0.44–1.00)
GFR calc Af Amer: >60 mL/min (ref >60)
GFR calc non Af Amer: 56 mL/min — ABNORMAL LOW (ref >60)
Glucose, Bld: 80 mg/dL (ref 70–99)
Potassium: 4.1 mmol/L (ref 3.5–5.1)
Sodium: 132 mmol/L — ABNORMAL LOW (ref 135–145)

## 2019-08-24 MED ORDER — TRAMADOL HCL 50 MG PO TABS: 50.0000 mg | ORAL_TABLET | Freq: Two times a day (BID) | ORAL | Status: DC | PRN

## 2019-08-24 MED ORDER — AMLODIPINE BESYLATE 2.5 MG PO TABS: 2.5000 mg | ORAL_TABLET | Freq: Every day | ORAL | 0 refills | Status: AC

## 2019-08-24 MED ORDER — PROCHLORPERAZINE MALEATE 5 MG PO TABS
5.0000 mg | ORAL_TABLET | Freq: Four times a day (QID) | ORAL | Status: DC | PRN
  Filled 2019-08-24 (×2): qty 1

## 2019-08-24 MED ORDER — AMIODARONE HCL 100 MG PO TABS: 100.0000 mg | ORAL_TABLET | Freq: Every day | ORAL | 0 refills | Status: AC

## 2019-08-24 MED ORDER — PROCHLORPERAZINE MALEATE 5 MG PO TABS: 5.0000 mg | ORAL_TABLET | Freq: Four times a day (QID) | ORAL | 0 refills | Status: AC | PRN

## 2019-08-24 MED ORDER — FAMOTIDINE 20 MG PO TABS: 20.0000 mg | ORAL_TABLET | Freq: Every day | ORAL | 0 refills | Status: AC

## 2019-08-24 NOTE — Progress Notes (Signed)
Patient ID: [name], female   DOB: Sep 03, 1920, 86 y.o.   MRN: [id number]  This NP visited patient at the bedside as a follow up for palliative medicine needs and emotional support.  Family called team phone number requesting a visit from a palliative medicine provider with concerns about pending discharge.  Daughter [name] is at the bedside, she has questions and concerns regarding patient's discharge home with hospice services.  [name] had questions regarding initiation of some short-term IV fluids.  We did revisit goals of care and daughter clearly states that comfort is the priority for this patient.  Education offered on the difference between aggressive medical intervention path and a palliative comfort path for this patient at this time in this situation..  Education offered on the natural trajectory and expectations of end-of-life.  Education offered on concept specific to natural dehydration at end of life,  Education offered on hospice services in the home.  Daughter verbalizes that she now has a clear understanding of the next steps for transition of care back home with hospice.  Questions and concerns addressed   Discussed with hospice liaison and LCSW   Shortly after this visit I spoke to daughter [name] and again and address the above topics, and answered questions and concerns to the best my ability.  Both daughters expressed concerns regarding their mother's care during this hospitalization.  They tell me they have already contacted Patient Experience.  I made them aware that they can view their mother's chart in my chart and request medical records in the future.  Emotional support offered  Total time spent on the unit was 60 minutes  Greater than 50% of the time was spent in counseling and coordination of care  [name] NP  Palliative Medicine Team Team Phone # [phone] Pager [phone]

## 2019-08-24 NOTE — TOC Transition Note (Addendum)
Transition of Care [facility]) - CM/SW Discharge Note   Patient Details  Name: ORPHA DAIN MRN: [id number] Date of Birth: [birth year]  [facility] CM/SW Contact:  Curlene Labrum, RN Phone Number: 08/24/2019, 12:41 PM   Clinical Narrative:    Case management met with the patient and daughter, Renee Pain the bedside to coordinate patient's transport to home today via [facility].  The daughter requested a 3 pm transport today to help facilitate a smooth transition to home - Venia Carbon, [facility] with [facility] is aware of the patient's transport home at this time.  Marita Kansas, RN is in the room with the daughter at this time to discuss discharge instructions and offer support to the patient and daughter for coordination of care.  [facility] was booked for 3 pm today for the patient's transfer home.  08/24/2019 - 1400 - Met with the patient's daughter, Mardene Celeste, in the room and Barbaraann Share on the phone along with Venia Carbon at the bedside to give support, answer questions regarding transitioning to home today.  The daughters are both in agreement to transport the patient home today at 3 pm via [place].  [facility] will arrive at 3 pm as previously scheduled for patient's discharge home today.   Final next level of care: [place] Barriers to Discharge: Continued Medical Work up   Patient Goals and CMS Choice     Choice offered to / list presented to : Adult Children  Discharge Placement                       Discharge Plan and Services   Discharge Planning Services: CM Consult Post Acute Care Choice: Home Health          DME Arranged: N/A         HH Arranged: RN, PT          Social Determinants of Health (SDOH) Interventions     Readmission Risk Interventions No flowsheet data found.

## 2019-08-24 NOTE — Progress Notes (Signed)
Referring Physician(s): Nolberto Hanlon  Supervising Physician: Arne Cleveland  Patient Status:  [facility] - In-pt  Chief Complaint: None  Subjective:  History of chronic recurrent PSBO s/p percutaneous gastrostomy tube placement in IR 08/23/2019 by Dr. Kathlene Cote. Patient laying in bed resting comfortably, somnolent . She does not respond to voice. Daughter at bedside. Gastrostomy tube site c/d/i.   Allergies: Hydrocortisone, Sulfa antibiotics, Cefdinir, Cortisone, Prednisone, Sulfasalazine, and Sulfonamide derivatives  Medications: Prior to Admission medications   Medication Sig Start Date End Date Taking? Authorizing Provider  acetaminophen (TYLENOL) 500 MG tablet Take 500-1,000 mg by mouth See admin instructions. Take 500mg  in the morning, 500mg  midday and 1000mg  at bedtime.   Yes [provider]  amiodarone (PACERONE) 200 MG tablet Take 1 tablet (200 mg total) by mouth daily. 09/11/18  Yes Belva Crome, MD  ASPERCREME LIDOCAINE EX Apply 1 application topically at bedtime. Left back rib   Yes [provider]  Calcium Carbonate-Vitamin D (CALCIUM HIGH POTENCY/VITAMIN D) 600-200 MG-UNIT TABS Take 1 tablet by mouth daily.    Yes [provider]  CRANBERRY EXTRACT PO Take 2 tablets by mouth 2 (two) times daily.    Yes [provider]  dronabinol (MARINOL) 2.5 MG capsule Take 2.5 mg by mouth 2 (two) times daily.  07/27/19  Yes [provider]  ELIQUIS 2.5 MG TABS tablet TAKE 1 TABLET BY MOUTH TWICE DAILY. 04/22/19  Yes Belva Crome, MD  gabapentin (NEURONTIN) 100 MG capsule Take 100-200 mg by mouth See admin instructions. Take 1 capsule by mouth in the morning, 1 capsule at 3:30p and 2 capsules at bedtime   Yes [provider]  levothyroxine (SYNTHROID, LEVOTHROID) 150 MCG tablet Take 150 mcg by mouth daily before breakfast. BRAND NAME ONLY (Synthroid)   Yes [provider]  lubiprostone (AMITIZA) 8 MCG capsule Take 8 mcg by mouth  3 (three) times daily with meals.    Yes [provider]  Misc Natural Products (GLUCOSAMINE CHONDROITIN COMPLX) TABS Take 1 tablet by mouth 2 (two) times daily.    Yes [provider]  Multiple Vitamins-Minerals (OCUVITE ADULT 50+ PO) Take 1 tablet by mouth daily.   Yes [provider]  nitrofurantoin (MACRODANTIN) 50 MG capsule Take 50 mg by mouth daily at 12 noon.    Yes [provider]  nitroGLYCERIN (NITROSTAT) 0.4 MG SL tablet Place 1 tablet (0.4 mg total) under the tongue every 5 (five) minutes as needed for chest pain. 09/07/18 08/16/19 Yes Belva Crome, MD  predniSONE (DELTASONE) 5 MG tablet Take 5 mg by mouth daily.    Yes [provider]  Probiotic Product ([place]) Take 1 tablet by mouth daily.    Yes [provider]  traMADol (ULTRAM) 50 MG tablet Take 50 mg by mouth at bedtime.    Yes [provider]     Vital Signs: BP (!) 125/48 (BP Location: Left Arm)   Pulse 67   Temp (!) 97.5 F (36.4 C) (Axillary)   Resp 14   SpO2 98%   Physical Exam Vitals and nursing note reviewed.  Constitutional:      General: She is not in acute distress.    Comments: Somnolent.  Pulmonary:     Effort: Pulmonary effort is normal. No respiratory distress.  Abdominal:     Comments: Gastrostomy tube site without erythema, drainage, or active bleeding; bumper cinched to skin.  Skin:    General: Skin is warm and dry.  Imaging: DG Chest 2 View  Result Date: 08/22/2019 CLINICAL DATA:  Dyspnea EXAM: CHEST - 2 VIEW COMPARISON:  08/19/2019 chest radiograph. FINDINGS: Right rotated chest radiograph stable cardiomediastinal silhouette with mild cardiomegaly. No pneumothorax. Stable small bilateral pleural effusions. Hazy patchy mild opacities throughout both lungs, improved. IMPRESSION: 1. Stable small bilateral pleural effusions. 2. Stable mild cardiomegaly. 3. Improved hazy patchy mild opacities throughout both  lungs, either improving pneumonia or pulmonary edema. Electronically Signed   By: Ilona Sorrel M.D.   On: 08/22/2019 10:58    Labs:  CBC: Recent Labs    03/17/19 0420 08/16/19 0117 08/17/19 0343 08/20/19 0252  WBC 8.2 15.6* 8.7 10.0  HGB 11.1* 14.0 10.0* 10.3*  HCT 34.2* 41.4 30.2* 31.3*  PLT 229 307 258 267    COAGS: Recent Labs    08/16/19 0540  INR 1.1    BMP: Recent Labs    08/21/19 1319 08/22/19 0144 08/23/19 1029 08/24/19 0107  NA 133* 130* 130* 132*  K 3.0* 3.8 4.1 4.1  CL 92* 93* 95* 95*  CO2 29 28 27 25   GLUCOSE 109* 102* 90 80  BUN 13 11 15 17   CALCIUM 8.6* 8.3* 8.6* 8.7*  CREATININE 0.86 0.71 0.79 0.87  GFRNONAA 57* >60 >60 56*  GFRAA >60 >60 >60 >60    LIVER FUNCTION TESTS: Recent Labs    03/14/19 1632 03/15/19 0433 08/16/19 0117 08/17/19 0343  BILITOT 1.0 1.0 0.9 0.8  AST 21 16 18 20   ALT 12 12 13 15   ALKPHOS 46 38 82 50  PROT 6.0* 5.2* 6.0* 4.7*  ALBUMIN 3.8 3.2* 3.3* 2.5*    Assessment and Plan:  History of chronic recurrent PSBO s/p percutaneous gastrostomy tube placement in IR 08/23/2019 by Dr. Kathlene Cote. Gastrostomy tube stable, tube is ready for use PRN (venting/decompressive purposes). Please ensure bumper cinched to skin to prevent leakage. Long discussion with patient's daughter regarding yesterdays procedure and tube maintenance (instructions for at home use in AVS). Further plans per [facility]- appreciate/agree with management. Please call IR with questions/concerns.   Electronically Signed: Earley Abide, PA-C 08/24/2019, 11:16 AM   I spent a total of 25 Minutes at the the patient's bedside AND on the patient's hospital floor or unit, greater than 50% of which was counseling/coordinating care for chronic recurrent PSBO s/p gastrostomy tube placement.

## 2019-08-24 NOTE — Progress Notes (Signed)
   08/24/19 0946  Clinical Encounter Type  Visited With Patient and family together  Visit Type Initial;Spiritual support  Referral From Palliative care team  Consult/Referral To Chaplain  Spiritual Encounters  Spiritual Needs Prayer  Stress Factors  Patient Stress Factors  (Pain)  This chaplain responded to PMT consult for spiritual care.  The Pt. daughter-Tricia is bedside.  [name] shared the Pt. daughters are sharing time with their mother in 12 hour shifts.  The Pt. is awake and participates in conversation, but reminds [name] she is not comfortable talking.  The chaplain encouraged [name] to communicate the Pt. pain needs to the RN. [name] connects the chaplain with the Pt. 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 The Pt. has a relationship with [facility].  [name] is tearful as she reflects on the family's desire to take the Pt. home with Hospice. The chaplain offers words of assurance and comfort as [name] states it is hard to see her mother suffer. The chaplain accepted the family's request for prayer. The chaplain offered F/U spiritual care as needed.

## 2019-08-24 NOTE — Progress Notes (Signed)
This chaplain responded to PMT-MH referral for a phone call to Pt. daughter [name] [phone].  The first phone call is at 10:19am, the second phone call is at 11:48am.  The chaplain practiced reflective listening as [name] shared her mother's Pt. experience. [name] was appreciated the time with the chaplain and invited a second call. The chaplain heard less emotion in the [name]'s voice as she politely declined the second call because the Pt. family was visiting. The chaplain communicated PMT-ML will visit the Pt. today.  [name] requested a phone call from PMT-ML before the visit.  The chaplain offered F/U spiritual care 24hrs a day through a RN page.  This chaplain will F/U with the Pt. and family on Wednesday morning.

## 2019-08-24 NOTE — Progress Notes (Addendum)
Manufacturing engineer [facility])    Spoke with [name] this morning, she was confused about events that transpired yesterday.  States that the team taking care of her did not seem to be on the same page. Attempted to explain comfort care in the hospital vs hospice at home.  She would like full scope of care until she d/c's, which is hopefully later today.  Family unsure about transport home, likely will need ambulance.   All necessary DME has been delivered.   Please arrange for comfort scripts prior to her d/c so there is no lapse in her comfort prior to hospice starting.  Venia Carbon BSN, RN, [place] [facility] Liaison

## 2019-08-24 NOTE — Progress Notes (Signed)
O2 98% on room air

## 2019-08-24 NOTE — Progress Notes (Signed)
Patient ID: [name], female   DOB: 11/10/1920, 86 y.o.   MRN: [id number]   Please note per nursing note yesterday I was contacted about feeding since we could not get in touch with IR, however  I did start patient on clear diet last night.  Patient was started on clear diet at night even though the nurses note states no diet  That patient was  n.p.o. ! I did discuss case with nursing director of the patient was started on full liquids and soft diet.

## 2019-08-24 NOTE — Care Management Important Message (Signed)
Important Message  Patient Details  Name: [name] MRN: [id number] Date of Birth: 04/24/1920   Medicare Important Message Given:  Yes     Orbie Pyo 08/24/2019, 2:02 PM

## 2019-08-24 NOTE — Discharge Summary (Signed)
[name] IWP:[id number] DOB: 1920/10/06 DOA: 08/16/2019  PCP: Antony Contras, MD  Admit date: 08/16/2019 Discharge date: 08/24/2019  Admitted From: home Disposition: Home with hospice  Recommendations for Outpatient Follow-up:  1. Follow up with PCP in 1 week  Home Health: Hospice   Discharge Condition:Stable CODE STATUS: DNR Diet recommendation: Full liquid diet can increase to regular as tolerated Brief/Interim Summary: 86 year old female with past medical history of chronic kidney disease stage III, chronic atrial fibrillation, prior DVT status post IVC filter placement, diastolic congestive heart failure (Echo 08/2018 EF 55-60%), hypothyroidism and multiple small bowel obstructions in the past (last in 03/2019 and 11/2016) who presents to [facility] emergency department with complaints of abdominal pain nausea and vomiting.CT imaging of the abdomen revealed evidence of small bowel obstruction with distal transition zone likely secondary to adhesions.  Please see full report below.  She was admitted to the hospital service.  GI and general surgery were consulted.  Small bowel obstruction due to postoperative adhesions Patient suffering a recurrent small bowel obstruction, likely secondary to adhesions Patient is a longstanding known history of recurrent small bowel obstructions with previous hospitalizations in March 2021 and November 2018.During each of these hospitalizations no surgical intervention was pursued. Patient has refused surgical consultation, and NG tube placement-no active vomiting Consulted GI per family's request-input was appreicated- use reglan and compazine General surgery was consulted input was appreciated.  They recommended percutaneous gastrostomy tube via IR for symptomatic relief and reduce her risk of rehospitalization. She is now s/p percutaneous gastrostomy tube placement in IR 08/23/2019 by Dr. Kathlene Cote.  Instructions was given to family member that  was present Per IR PA via chat okay to resume Eliquis tonight.    Pneumonia of the bilateral lower lobes secondary to gastric secretions   Patchy bilateral lower lobe infiltrates on CT imaging  Blood cultures to date negative continue to monitor  Repeat chest x-ray with improvement   She completed unsayn course for possible aspiration    Hyponatremia   Sodium low 126 on admission, thought due to hypovolemia. Did improve some then given lasix then Na dropped again. Lasix was discontinued and now sodium is 132 today.   Acute/Chronic diastolic heart failure (HCC) BNP initially in the 800s, decreased    On exam mildly volume overloaded, and pt was c/o sob. Was given low dose lasix prn . Then d/c'd to hyponatremia. cxr with improvement. Cards saw pt agreed to lasix prn. Then signed off.  Hypokalemia- Replaced, stable. K 4.1 today.   Chronic kidney disease, stage 3a   Creatinine on admission 1.23, likely prerenal.  At baseline.improved with ivf.   Episodes of Unresponsive- Etiology unlcear. EEG no seizures. No arrhythmia on tele. She is somnolent possibly due to compazine and also pt telling me she is not sleeping much thus combo of all.  Neurology consulted, etiology unclear. Somnolent possibly multifactorial as she was not sleeping at night and receiving compazine. Family agreed to continue compazine for her nausea.   Atrial fibrillation, chronic (HCC) -Temporarily holding home regimen of Eliquis both in case of surgical intervention as well as in preparation for possible NG tube placement to avoid bleeding complications. Patient is typically on amiodarone 200 mg daily  Cards recommended decrease amiodarone to 100 mg daily Resume Eliquis    Hypoalbuminemia -Replaced albumin 25%x1 8/2//21   Failure to thrive, malnutrition -Due to poor p.o. intake, multiple comorbidities, and eluding recurrent SBO's, -BMI 18.4,weight 48.6  kg   Nutritional status: Nutrition Problem:  Predicted suboptimal nutrient intake Etiology: acute illness (SBO) Signs/Symptoms: percent weight loss Percent weight loss: 14.3 % Interventions: Ensure Enlive (each supplement provides 350kcal and 20 grams of protein), Magic cup, MVI   Please not patient going home with hospice care.  Her daughter yesterday family is aware that this is end-of-life for her and that they want her to be home and prevent her from coming back.  If they are okay with her being on hospice and trying to keep her at home.   Discharge Diagnoses:  Principal Problem:   Small bowel obstruction due to postoperative adhesions Active Problems:   Aspiration pneumonia of both lower lobes due to gastric secretions (HCC)   Chronic diastolic heart failure (HCC)   Chronic kidney disease, stage 3a   Atrial fibrillation, chronic (HCC)   Hyponatremia   Adult failure to thrive   Goals of care, counseling/discussion   Other chronic pain   Nausea   Palliative care by specialist   DNR (do not resuscitate)   Protein-calorie malnutrition, severe   Pleural effusion   Palliative care encounter    Discharge Instructions  Discharge Instructions     Remove dressing in 72 hours   Complete by: As directed    Diet - low sodium heart healthy   Complete by: As directed    Diet full liquid   Complete by: As directed    Discharge instructions   Complete by: As directed    Can Start Eliquis tonight   Increase activity slowly   Complete by: As directed      Allergies as of 08/24/2019      Reactions   Hydrocortisone Other (See Comments)   Sulfa Antibiotics Nausea Only   Cefdinir Other (See Comments)   Caused bad diarrhea Caused bad diarrhea Caused bad diarrhea Caused bad diarrhea Caused bad diarrhea   Cortisone Other (See Comments)   Flushed hands and face Flushed hands and face Flushed hands and face Flushed hands and face Flushed hands and face   Prednisone Other  (See Comments)   cuased blood clots Can have very small dose cuased blood clots cuased blood clots Can have very small dose cuased blood clots cuased blood clots   Sulfasalazine Other (See Comments)   Nausea side effects Stomach pains and cramping Nausea side effects Stomach pains and cramping Stomach pains and cramping   Sulfonamide Derivatives    Stomach pains and cramping      Medication List    STOP taking these medications   nitrofurantoin 50 MG capsule Commonly known as: MACRODANTIN     TAKE these medications   acetaminophen 500 MG tablet Commonly known as: TYLENOL Take 500-1,000 mg by mouth See admin instructions. Take 500mg  in the morning, 500mg  midday and 1000mg  at bedtime.   amiodarone 100 MG tablet Commonly known as: PACERONE Take 1 tablet (100 mg total) by mouth daily. Start taking on: August 25, 2019 What changed:   medication strength  how much to take   amLODipine 2.5 MG tablet Commonly known as: NORVASC Take 1 tablet (2.5 mg total) by mouth daily. Start taking on: August 25, 2019   ASPERCREME LIDOCAINE EX Apply 1 application topically at bedtime. Left back rib   Calcium High Potency/Vitamin D 600-200 MG-UNIT Tabs Generic drug: Calcium Carbonate-Vitamin D Take 1 tablet by mouth daily.   CRANBERRY EXTRACT PO Take 2 tablets by mouth 2 (two) times daily.   dronabinol 2.5 MG capsule Commonly known as: MARINOL Take 2.5 mg by mouth 2 (two)  times daily.   Eliquis 2.5 MG Tabs tablet Generic drug: apixaban TAKE 1 TABLET BY MOUTH TWICE DAILY.   famotidine 20 MG tablet Commonly known as: PEPCID Take 1 tablet (20 mg total) by mouth daily. Start taking on: August 25, 2019   gabapentin 100 MG capsule Commonly known as: NEURONTIN Take 100-200 mg by mouth See admin instructions. Take 1 capsule by mouth in the morning, 1 capsule at 3:30p and 2 capsules at bedtime   Glucosamine Chondroitin Complx Tabs Take 1 tablet by mouth 2 (two) times daily.    levothyroxine 150 MCG tablet Commonly known as: SYNTHROID Take 150 mcg by mouth daily before breakfast. BRAND NAME ONLY (Synthroid)   lubiprostone 8 MCG capsule Commonly known as: AMITIZA Take 8 mcg by mouth 3 (three) times daily with meals.   nitroGLYCERIN 0.4 MG SL tablet Commonly known as: NITROSTAT Place 1 tablet (0.4 mg total) under the tongue every 5 (five) minutes as needed for chest pain.   OCUVITE ADULT 50+ PO Take 1 tablet by mouth daily.   predniSONE 5 MG tablet Commonly known as: DELTASONE Take 5 mg by mouth daily.   prochlorperazine 5 MG tablet Commonly known as: COMPAZINE Take 1 tablet (5 mg total) by mouth every 6 (six) hours as needed for nausea or vomiting.   traMADol 50 MG tablet Commonly known as: ULTRAM Take 50 mg by mouth at bedtime.   ULTRAFLORA IMMUNE HEALTH PO Take 1 tablet by mouth daily.            Durable Medical Equipment  (From admission, onward)         Start     Ordered   08/19/19 1605  For home use only DME Hospital bed  Once       Question Answer Comment  Length of Need Lifetime   Patient has (list medical condition): Small bowel obstruction due to postoperative adhesions   The above medical condition requires: Patient requires the ability to reposition frequently   Head must be elevated greater than: 45 degrees   Bed type Semi-electric   Support Surface: Gel Overlay      08/19/19 1608   08/19/19 1605  For home use only DME 3 n 1  Once       Comments: Drop arm   08/19/19 1608   08/18/19 1225  For home use only DME 3 n 1  Once        08/18/19 1225          Follow-up Information    Stoneking, Christiane Ha, MD. Schedule an appointment as soon as possible for a visit.   Specialty: Internal Medicine Contact information: [address] [place] [address] [phone]        Antony Contras, MD Follow up in 1 week(s).   Specialty: Family Medicine Contact information: [place] [address] [phone]              Allergies  Allergen Reactions  . Hydrocortisone Other (See Comments)  . Sulfa Antibiotics Nausea Only  . Cefdinir Other (See Comments)    Caused bad diarrhea Caused bad diarrhea Caused bad diarrhea Caused bad diarrhea Caused bad diarrhea  . Cortisone Other (See Comments)    Flushed hands and face  Flushed hands and face Flushed hands and face  Flushed hands and face Flushed hands and face  . Prednisone Other (See Comments)    cuased blood clots Can have very small dose cuased blood  clots cuased blood clots Can have very small dose cuased blood clots cuased blood clots  . Sulfasalazine Other (See Comments)    Nausea side effects Stomach pains and cramping Nausea side effects Stomach pains and cramping Stomach pains and cramping  . Sulfonamide Derivatives     Stomach pains and cramping     Consultations:  Neurology, surgery, GI, IR, palliative care   Procedures/Studies: EEG  Result Date: 08/18/2019 [name], MD     08/18/2019  6:10 PM Patient Name: [name] MRN: [id number] Epilepsy Attending: [name] Referring Physician/Provider: [name], PA Date: 08/18/2019 Duration: 23.49 mins Patient history: 86 year old female who initially presented to the hospital secondary to small bowel obstruction.  While in the hospital patient had an episode of transient unresponsiveness associated with some jerking motions.  EEG evaluate for seizures. Level of alertness: Awake, assleep AEDs during EEG study: Gabapentin Technical aspects: This EEG study was done with scalp electrodes positioned according to the 10-20 International system of electrode placement. Electrical activity was acquired at a sampling rate of 500Hz  and reviewed with a high frequency filter of 70Hz  and a low frequency filter of 1Hz . EEG data were recorded continuously and digitally stored. Description: The posterior dominant rhythm consists of 7 Hz activity of  moderate voltage (25-35 uV) seen predominantly in posterior head regions, symmetric and reactive to eye opening and eye closing. Sleep was characterized by vertex waves, sleep spindles (12 to 14 Hz), maximal frontocentral region. EEG showed continuous generalized 6-7 Hz theta slowing. Hyperventilation and photic stimulation were not performed.   Patient was noted to have face/jaw tremor during the study.  Concomitant EEG before, during and after the event did not show any EEG changes suggest seizure. ABNORMALITY - Continuous slow, generalized - Background slow IMPRESSION: This study is suggestive of mild diffuse encephalopathy, nonspecific etiology.  No seizures or epileptiform discharges were seen throughout the recording.  Patient was noted to have baseline short-term during the study without concomitant EEG change and was not epileptic. Priyanka Barbra Sarks   CT ABDOMEN PELVIS WO CONTRAST  Result Date: 08/16/2019 CLINICAL DATA:  Suspected bowel obstruction. Nausea and vomiting with dark emesis. Previous history of small bowel obstruction. EXAM: CT ABDOMEN AND PELVIS WITHOUT CONTRAST TECHNIQUE: Multidetector CT imaging of the abdomen and pelvis was performed following the standard protocol without IV contrast. COMPARISON:  07/12/2019 FINDINGS: Lower chest: Bronchiectasis and interstitial fibrosis in the lung bases. Mild patchy airspace infiltration may represent inflammatory change, pneumonia, or edema. Small left pleural effusion. Small pericardial effusion. Cardiac enlargement with coronary artery calcification. Tortuous and dilated descending aorta with calcification. Hepatobiliary: The hepatic parenchyma appears diffusely dense, possibly artifact or possibly iron overload. No focal lesions. Increased density of the bile may represent vicarious excretion or milk of calcium. Noncalcified gallstone. No bile duct dilatation. Pancreas: Fatty atrophy of the pancreas. Spleen: Normal in size without focal abnormality.  Adrenals/Urinary Tract: No adrenal gland nodules. Kidneys are symmetrical. No hydronephrosis or hydroureter. No renal or ureteral stones. Bladder is decompressed. Stomach/Bowel: Dilated gas and fluid-filled small bowel with air-fluid levels. The terminal ileum is decompressed. Stool-filled colon is nondistended. Changes consistent with small bowel obstruction. Distal transition zone, likely adhesions. Vascular/Lymphatic: Diffuse aortic calcification. Low IVC filter. Dilated aorta at the thoracic inlet measuring about 4.2 cm diameter. No significant lymphadenopathy. Reproductive: Uterus and bilateral adnexa are unremarkable. Other: No abdominal wall hernia or abnormality. No abdominopelvic ascites. Musculoskeletal: Lumbar scoliosis convex towards the left. Degenerative changes in the lumbar  spine and hips. Geographic sclerosis and lucency in the superior right femoral head may indicate avascular necrosis. IMPRESSION: 1. Small bowel obstruction with distal transition zone, likely adhesions. 2. Cardiac enlargement with coronary artery calcification. Small pericardial effusion. 3. Bronchiectasis and interstitial fibrosis in the lung bases. Mild patchy airspace infiltration may represent inflammatory change, pneumonia, or edema. Small left pleural effusion. 4. Dilated aorta at the thoracic inlet measuring about 4.2 cm diameter. 5. Low IVC filter. 6. Geographic sclerosis and lucency in the superior right femoral head may indicate avascular necrosis. 7. Noncalcified gallstone. 8. Increased density of the hepatic parenchyma may represent artifact or iron overload. Aortic Atherosclerosis (ICD10-I70.0). Electronically Signed   By: Lucienne Capers M.D.   On: 08/16/2019 00:55   DG Chest 1 View  Result Date: 08/16/2019 CLINICAL DATA:  Nausea and vomiting for 2 hours. EXAM: CHEST  1 VIEW COMPARISON:  03/14/2019 FINDINGS: Shallow inspiration. Normal heart size and pulmonary vascularity. Patchy perihilar and basilar  infiltrates, greater in the left base. Changes may represent pneumonia or aspiration. No pleural effusions. No pneumothorax. Mediastinal contours appear intact. Degenerative changes in the spine and shoulders. Aortic calcification. Incidental note of right rib fractures, some appearing acute. IMPRESSION: Patchy perihilar and basilar infiltrates, greater in the left base. Changes may represent pneumonia or aspiration. Electronically Signed   By: Lucienne Capers M.D.   On: 08/16/2019 03:30   DG Chest 2 View  Result Date: 08/22/2019 CLINICAL DATA:  Dyspnea EXAM: CHEST - 2 VIEW COMPARISON:  08/19/2019 chest radiograph. FINDINGS: Right rotated chest radiograph stable cardiomediastinal silhouette with mild cardiomegaly. No pneumothorax. Stable small bilateral pleural effusions. Hazy patchy mild opacities throughout both lungs, improved. IMPRESSION: 1. Stable small bilateral pleural effusions. 2. Stable mild cardiomegaly. 3. Improved hazy patchy mild opacities throughout both lungs, either improving pneumonia or pulmonary edema. Electronically Signed   By: Ilona Sorrel M.D.   On: 08/22/2019 10:58   DG Chest 2 View  Result Date: 08/19/2019 CLINICAL DATA:  Weakness, shortness of breath, pleural effusion EXAM: CHEST - 2 VIEW COMPARISON:  08/18/2019 FINDINGS: Similar elevation of the right hemidiaphragm. There are patchy bilateral opacities. Small bilateral pleural effusions with bibasilar atelectasis. No pneumothorax. Stable cardiomediastinal contours. No acute osseous abnormality. IMPRESSION: No substantial change since 08/18/2019. Small bilateral pleural effusions with bibasilar atelectasis. Patchy bilateral opacities. Electronically Signed   By: Macy Mis M.D.   On: 08/19/2019 08:09   DG Abd 1 View  Result Date: 08/16/2019 CLINICAL DATA:  86 year old female with nausea vomiting. Small-bowel obstruction. EXAM: ABDOMEN - 1 VIEW COMPARISON:  Abdominal radiograph dated 08/16/2019 and CT dated 08/16/2019  FINDINGS: There is moderate stool in the colon. Air is noted within the colon. There is a mildly dilated loop of bowel in the pelvis measuring 3.6 cm, likely a small bowel loop. Overall interval improvement of the dilated bowel loops since the earlier CT and radiograph. No free air. An IVC filter noted. There is degenerative changes of the spine. No acute osseous pathology. IMPRESSION: Overall interval improvement of the small bowel dilatation since the earlier CT and radiograph. Continued follow-up recommended. Electronically Signed   By: Anner Crete M.D.   On: 08/16/2019 15:38   CT HEAD WO CONTRAST  Result Date: 08/18/2019 CLINICAL DATA:  Altered mental status EXAM: CT HEAD WITHOUT CONTRAST TECHNIQUE: Contiguous axial images were obtained from the base of the skull through the vertex without intravenous contrast. COMPARISON:  Brain MRI August 24, 2018; head CT March 18, 2014 FINDINGS: Brain: Age related  volume loss stable. No intracranial mass, hemorrhage, extra-axial fluid collection, or midline shift. There is patchy small vessel disease in the centra semiovale bilaterally. No evident acute infarct. Vascular: No hyperdense vessel. There is mild calcification in each carotid siphon region. Skull: Bony calvarium a appears intact. Sinuses/Orbits: There is mucosal thickening in several ethmoid air cells. There is a small retention cyst in the posterior left ethmoid region. Other visualized paranasal sinuses are clear. Visualized orbits appear symmetric bilaterally. Other: Mastoid air cells are clear. IMPRESSION: Age related volume loss with patchy periventricular small vessel disease. No evident acute infarct. No mass or hemorrhage. There are foci of arterial vascular calcification. There is mucosal thickening in several ethmoid air cells. Electronically Signed   By: Lowella Grip III M.D.   On: 08/18/2019 14:22   DG CHEST PORT 1 VIEW  Result Date: 08/18/2019 CLINICAL DATA:  Shortness of breath EXAM:  PORTABLE CHEST 1 VIEW COMPARISON:  August 16, 2019 FINDINGS: There is cardiomegaly with a degree of pulmonary venous hypertension. There are pleural effusions bilaterally with apparent areas of consolidation in the lung bases. There is ill-defined opacity throughout the left mid lung and left base regions. There is aortic atherosclerosis. No adenopathy. Bones are osteoporotic. IMPRESSION: There is cardiomegaly with a degree of pulmonary vascular congestion. There are pleural effusions bilaterally. There may be a degree of congestive heart failure. Areas of airspace opacity in the left mid and lower lung regions could be indicative of pneumonia or aspiration. Consolidation in the bases also may be indicative of pneumonia or aspiration. There may be superimposed pulmonary edema; more than one of these entities may be present concurrently. Electronically Signed   By: Lowella Grip III M.D.   On: 08/18/2019 10:39   DG Abd 2 Views  Result Date: 08/16/2019 CLINICAL DATA:  Nausea and vomiting for the last 2 hours.  Pneumonia EXAM: ABDOMEN - 2 VIEW COMPARISON:  Abdominal CT from earlier today FINDINGS: Distended small bowel loops with some improvement from earlier today. No evidence of pneumoperitoneum. Opacity at the lung bases, greater on the left. There is airspace disease and pleural fluid by CT. IVC filter in place. Scoliosis and spinal degeneration. IMPRESSION: 1. Small bowel obstruction by CT earlier the same day. Bowel loops appear less distended than on comparison scout image. 2. Left base infiltrate with small pleural effusion. Electronically Signed   By: Monte Fantasia M.D.   On: 08/16/2019 07:34       Subjective: Sleepy this AM.   Discharge Exam: Vitals:   08/24/19 0528 08/24/19 1036  BP: (!) 152/71 (!) 125/48  Pulse: 71 67  Resp: 18 14  Temp: 98.1 F (36.7 C) (!) 97.5 F (36.4 C)  SpO2: 98% 98%   Vitals:   08/23/19 2026 08/24/19 0114 08/24/19 0528 08/24/19 1036  BP: (!) 150/77 133/66  (!) 152/71 (!) 125/48  Pulse: 77 73 71 67  Resp: 20 18 18 14   Temp: 99.5 F (37.5 C) 99.2 F (37.3 C) 98.1 F (36.7 C) (!) 97.5 F (36.4 C)  TempSrc: Oral Axillary Axillary Axillary  SpO2: 99% 99% 98% 98%    General: Sleepy this a.m. Cardiovascular: RRR, S1/S2 +, no rubs, no gallops Respiratory: Minimal by basilar dry crackles scattered no wheeze Abdominal: Soft, NT, ND, bowel sounds + Extremities: no edema, no cyanosis    The results of significant diagnostics from this hospitalization (including imaging, microbiology, ancillary and laboratory) are listed below for reference.     Microbiology: Recent Results (from the  past 240 hour(s))  SARS Coronavirus 2 by RT PCR (hospital order, performed in [facility] hospital lab) Nasopharyngeal Nasopharyngeal Swab     Status: None   Collection Time: 08/16/19  1:29 AM   Specimen: Nasopharyngeal Swab  Result Value Ref Range Status   SARS Coronavirus 2 NEGATIVE NEGATIVE Final    Comment: (NOTE) SARS-CoV-2 target nucleic acids are NOT DETECTED.  The SARS-CoV-2 RNA is generally detectable in upper and lower respiratory specimens during the acute phase of infection. The lowest concentration of SARS-CoV-2 viral copies this assay can detect is 250 copies / mL. A negative result does not preclude SARS-CoV-2 infection and should not be used as the sole basis for treatment or other patient management decisions.  A negative result may occur with improper specimen collection / handling, submission of specimen other than nasopharyngeal swab, presence of viral mutation(s) within the areas targeted by this assay, and inadequate number of viral copies (<250 copies / mL). A negative result must be combined with clinical observations, patient history, and epidemiological information.  Fact Sheet for Patients:   StrictlyIdeas.no  Fact Sheet for Healthcare Providers: BankingDealers.co.za  This test  is not yet approved or  cleared by the [place] FDA and has been authorized for detection and/or diagnosis of SARS-CoV-2 by FDA under an Emergency Use Authorization (EUA).  This EUA will remain in effect (meaning this test can be used) for the duration of the COVID-19 declaration under Section 564(b)(1) of the Act, 21 U.S.C. section 360bbb-3(b)(1), unless the authorization is terminated or revoked sooner.  Performed at [facility], [place] [address], [place], [place] [address]   Culture, blood (routine x 2)     Status: None   Collection Time: 08/16/19  5:25 AM   Specimen: BLOOD  Result Value Ref Range Status   Specimen Description BLOOD RIGHT ANTECUBITAL  Final   Special Requests   Final    BOTTLES DRAWN AEROBIC AND ANAEROBIC Blood Culture adequate volume   Culture   Final    NO GROWTH 5 DAYS Performed at [facility], [place] [address], [place], [place] [address]    Report Status 08/21/2019 FINAL  Final  Culture, blood (routine x 2)     Status: None   Collection Time: 08/16/19  5:26 AM   Specimen: BLOOD  Result Value Ref Range Status   Specimen Description BLOOD LEFT ANTECUBITAL  Final   Special Requests   Final    BOTTLES DRAWN AEROBIC AND ANAEROBIC Blood Culture adequate volume   Culture   Final    NO GROWTH 5 DAYS Performed at [facility], [place] [address] [place], [place] [address]    Report Status 08/21/2019 FINAL  Final     Labs: BNP (last 3 results) Recent Labs    08/18/19 1544 08/21/19 0919 08/22/19 0144  BNP 811.0* 321.6* 220.2*   Basic Metabolic Panel: Recent Labs  Lab 08/20/19 0252 08/21/19 1319 08/22/19 0144 08/23/19 1029 08/24/19 0107  NA 133* 133* 130* 130* 132*  K 3.6 3.0* 3.8 4.1 4.1  CL 94* 92* 93* 95* 95*  CO2 27 29 28 27 25   GLUCOSE 84 109* 102* 90 80  BUN 8 13 11 15 17   CREATININE 0.83 0.86 0.71 0.79 0.87  CALCIUM 8.2* 8.6* 8.3* 8.6* 8.7*  MG  --   --  1.8  --   --    Liver Function Tests: No results for input(s):  AST, ALT, ALKPHOS, BILITOT, PROT, ALBUMIN in the  last 168 hours. No results for input(s): LIPASE, AMYLASE in the last 168 hours. No results for input(s): AMMONIA in the last 168 hours. CBC: Recent Labs  Lab 08/20/19 0252  WBC 10.0  HGB 10.3*  HCT 31.3*  MCV 97.2  PLT 267   Cardiac Enzymes: No results for input(s): CKTOTAL, CKMB, CKMBINDEX, TROPONINI in the last 168 hours. BNP: Invalid input(s): POCBNP CBG: Recent Labs  Lab 08/20/19 0710  GLUCAP 70   D-Dimer No results for input(s): DDIMER in the last 72 hours. Hgb A1c No results for input(s): HGBA1C in the last 72 hours. Lipid Profile No results for input(s): CHOL, HDL, LDLCALC, TRIG, CHOLHDL, LDLDIRECT in the last 72 hours. Thyroid function studies No results for input(s): TSH, T4TOTAL, T3FREE, THYROIDAB in the last 72 hours.  Invalid input(s): FREET3 Anemia work up No results for input(s): VITAMINB12, FOLATE, FERRITIN, TIBC, IRON, RETICCTPCT in the last 72 hours. Urinalysis    Component Value Date/Time   COLORURINE YELLOW 03/14/2019 1549   APPEARANCEUR CLEAR 03/14/2019 1549   LABSPEC 1.014 03/14/2019 1549   PHURINE 7.0 03/14/2019 1549   GLUCOSEU NEGATIVE 03/14/2019 1549   HGBUR NEGATIVE 03/14/2019 1549   BILIRUBINUR NEGATIVE 03/14/2019 1549   KETONESUR NEGATIVE 03/14/2019 1549   PROTEINUR NEGATIVE 03/14/2019 1549   UROBILINOGEN 0.2 01/17/2013 0730   NITRITE POSITIVE (A) 03/14/2019 1549   LEUKOCYTESUR SMALL (A) 03/14/2019 1549   Sepsis Labs Invalid input(s): PROCALCITONIN,  WBC,  LACTICIDVEN Microbiology Recent Results (from the past 240 hour(s))  SARS Coronavirus 2 by RT PCR (hospital order, performed in [place] hospital lab) Nasopharyngeal Nasopharyngeal Swab     Status: None   Collection Time: 08/16/19  1:29 AM   Specimen: Nasopharyngeal Swab  Result Value Ref Range Status   SARS Coronavirus 2 NEGATIVE NEGATIVE Final    Comment: (NOTE) SARS-CoV-2 target nucleic acids are NOT DETECTED.  The  SARS-CoV-2 RNA is generally detectable in upper and lower respiratory specimens during the acute phase of infection. The lowest concentration of SARS-CoV-2 viral copies this assay can detect is 250 copies / mL. A negative result does not preclude SARS-CoV-2 infection and should not be used as the sole basis for treatment or other patient management decisions.  A negative result may occur with improper specimen collection / handling, submission of specimen other than nasopharyngeal swab, presence of viral mutation(s) within the areas targeted by this assay, and inadequate number of viral copies (<250 copies / mL). A negative result must be combined with clinical observations, patient history, and epidemiological information.  Fact Sheet for Patients:   StrictlyIdeas.no  Fact Sheet for Healthcare Providers: BankingDealers.co.za  This test is not yet approved or  cleared by the [place] FDA and has been authorized for detection and/or diagnosis of SARS-CoV-2 by FDA under an Emergency Use Authorization (EUA).  This EUA will remain in effect (meaning this test can be used) for the duration of the COVID-19 declaration under Section 564(b)(1) of the Act, 21 U.S.C. section 360bbb-3(b)(1), unless the authorization is terminated or revoked sooner.  Performed at [facility], [place] [address] [place], [place] [address]   Culture, blood (routine x 2)     Status: None   Collection Time: 08/16/19  5:25 AM   Specimen: BLOOD  Result Value Ref Range Status   Specimen Description BLOOD RIGHT ANTECUBITAL  Final   Special Requests   Final    BOTTLES DRAWN AEROBIC AND ANAEROBIC Blood Culture adequate volume   Culture   Final  NO GROWTH 5 DAYS Performed at [facility], [place] [address] [place], [place] [address]    Report Status 08/21/2019 FINAL  Final  Culture, blood (routine x 2)     Status: None   Collection Time: 08/16/19  5:26  AM   Specimen: BLOOD  Result Value Ref Range Status   Specimen Description BLOOD LEFT ANTECUBITAL  Final   Special Requests   Final    BOTTLES DRAWN AEROBIC AND ANAEROBIC Blood Culture adequate volume   Culture   Final    NO GROWTH 5 DAYS Performed at [facility], [place] [address] [place], [place] [address]    Report Status 08/21/2019 FINAL  Final     Time coordinating discharge: Over 30 minutes  SIGNED:   Nolberto Hanlon, MD  [facility] 08/24/2019, 1:42 PM Pager   If 7PM-7AM, please contact night-coverage [email or web address] Password [email or web address]

## 2019-08-24 NOTE — Progress Notes (Signed)
[name] to be D/C'd  per MD order. Discussed with the patient and all questions fully answered.  VSS, Skin clean, dry and intact without evidence of skin break down, no evidence of skin tears noted.  IV catheter discontinued intact. Site without signs and symptoms of complications. Dressing and pressure applied.  An After Visit Summary was printed and given to the patient.  D/c education completed with patient/family including follow up instructions, medication list, d/c activities limitations if indicated, with other d/c instructions as indicated by MD - family able to verbalize understanding, all questions fully answered.   Patient instructed to return to ED, call 911, or call MD for any changes in condition.   [facility] to transport patient home

## 2019-08-24 NOTE — Plan of Care (Signed)
  Problem: Health Behavior/Discharge Planning: Goal: Ability to manage health-related needs will improve Outcome: Progressing   Problem: Clinical Measurements: Goal: Ability to maintain clinical measurements within normal limits will improve Outcome: Progressing Goal: Will remain free from infection Outcome: Progressing Goal: Diagnostic test results will improve Outcome: Progressing Goal: Respiratory complications will improve Outcome: Progressing Goal: Cardiovascular complication will be avoided Outcome: Progressing   Problem: Clinical Measurements: Goal: Will remain free from infection Outcome: Progressing   Problem: Clinical Measurements: Goal: Diagnostic test results will improve Outcome: Progressing   Problem: Clinical Measurements: Goal: Respiratory complications will improve Outcome: Progressing   Problem: Activity: Goal: Risk for activity intolerance will decrease Outcome: Progressing   Problem: Clinical Measurements: Goal: Cardiovascular complication will be avoided Outcome: Progressing   Problem: Clinical Measurements: Goal: Cardiovascular complication will be avoided Outcome: Progressing   Problem: Clinical Measurements: Goal: Cardiovascular complication will be avoided Outcome: Progressing

## 2019-08-25 DIAGNOSIS — N390 Urinary tract infection, site not specified: Secondary | ICD-10-CM | POA: Diagnosis not present

## 2019-08-25 DIAGNOSIS — K219 Gastro-esophageal reflux disease without esophagitis: Secondary | ICD-10-CM | POA: Diagnosis not present

## 2019-08-25 DIAGNOSIS — E039 Hypothyroidism, unspecified: Secondary | ICD-10-CM | POA: Diagnosis not present

## 2019-08-25 DIAGNOSIS — I509 Heart failure, unspecified: Secondary | ICD-10-CM | POA: Diagnosis not present

## 2019-08-25 DIAGNOSIS — Z86718 Personal history of other venous thrombosis and embolism: Secondary | ICD-10-CM | POA: Diagnosis not present

## 2019-08-25 DIAGNOSIS — K56609 Unspecified intestinal obstruction, unspecified as to partial versus complete obstruction: Secondary | ICD-10-CM | POA: Diagnosis not present

## 2019-08-25 DIAGNOSIS — M3500 Sicca syndrome, unspecified: Secondary | ICD-10-CM | POA: Diagnosis not present

## 2019-08-25 DIAGNOSIS — Z431 Encounter for attention to gastrostomy: Secondary | ICD-10-CM | POA: Diagnosis not present

## 2019-08-25 DIAGNOSIS — I4891 Unspecified atrial fibrillation: Secondary | ICD-10-CM | POA: Diagnosis not present

## 2019-08-25 DIAGNOSIS — H3530 Unspecified macular degeneration: Secondary | ICD-10-CM | POA: Diagnosis not present

## 2019-08-25 DIAGNOSIS — J479 Bronchiectasis, uncomplicated: Secondary | ICD-10-CM | POA: Diagnosis not present

## 2019-08-25 DIAGNOSIS — N189 Chronic kidney disease, unspecified: Secondary | ICD-10-CM | POA: Diagnosis not present

## 2019-08-25 DIAGNOSIS — Z681 Body mass index (BMI) 19 or less, adult: Secondary | ICD-10-CM | POA: Diagnosis not present

## 2019-08-26 DIAGNOSIS — I4891 Unspecified atrial fibrillation: Secondary | ICD-10-CM | POA: Diagnosis not present

## 2019-08-26 DIAGNOSIS — K56609 Unspecified intestinal obstruction, unspecified as to partial versus complete obstruction: Secondary | ICD-10-CM | POA: Diagnosis not present

## 2019-08-26 DIAGNOSIS — N390 Urinary tract infection, site not specified: Secondary | ICD-10-CM | POA: Diagnosis not present

## 2019-08-26 DIAGNOSIS — I509 Heart failure, unspecified: Secondary | ICD-10-CM | POA: Diagnosis not present

## 2019-08-26 DIAGNOSIS — J479 Bronchiectasis, uncomplicated: Secondary | ICD-10-CM | POA: Diagnosis not present

## 2019-08-26 DIAGNOSIS — N189 Chronic kidney disease, unspecified: Secondary | ICD-10-CM | POA: Diagnosis not present

## 2019-08-27 DIAGNOSIS — I4891 Unspecified atrial fibrillation: Secondary | ICD-10-CM | POA: Diagnosis not present

## 2019-08-27 DIAGNOSIS — J479 Bronchiectasis, uncomplicated: Secondary | ICD-10-CM | POA: Diagnosis not present

## 2019-08-27 DIAGNOSIS — N189 Chronic kidney disease, unspecified: Secondary | ICD-10-CM | POA: Diagnosis not present

## 2019-08-27 DIAGNOSIS — I509 Heart failure, unspecified: Secondary | ICD-10-CM | POA: Diagnosis not present

## 2019-08-27 DIAGNOSIS — K56609 Unspecified intestinal obstruction, unspecified as to partial versus complete obstruction: Secondary | ICD-10-CM | POA: Diagnosis not present

## 2019-08-27 DIAGNOSIS — N390 Urinary tract infection, site not specified: Secondary | ICD-10-CM | POA: Diagnosis not present

## 2019-08-30 DIAGNOSIS — N189 Chronic kidney disease, unspecified: Secondary | ICD-10-CM | POA: Diagnosis not present

## 2019-08-30 DIAGNOSIS — I509 Heart failure, unspecified: Secondary | ICD-10-CM | POA: Diagnosis not present

## 2019-08-30 DIAGNOSIS — N390 Urinary tract infection, site not specified: Secondary | ICD-10-CM | POA: Diagnosis not present

## 2019-08-30 DIAGNOSIS — K56609 Unspecified intestinal obstruction, unspecified as to partial versus complete obstruction: Secondary | ICD-10-CM | POA: Diagnosis not present

## 2019-08-30 DIAGNOSIS — J479 Bronchiectasis, uncomplicated: Secondary | ICD-10-CM | POA: Diagnosis not present

## 2019-08-30 DIAGNOSIS — I4891 Unspecified atrial fibrillation: Secondary | ICD-10-CM | POA: Diagnosis not present

## 2019-08-31 DIAGNOSIS — I4891 Unspecified atrial fibrillation: Secondary | ICD-10-CM | POA: Diagnosis not present

## 2019-08-31 DIAGNOSIS — I509 Heart failure, unspecified: Secondary | ICD-10-CM | POA: Diagnosis not present

## 2019-08-31 DIAGNOSIS — N390 Urinary tract infection, site not specified: Secondary | ICD-10-CM | POA: Diagnosis not present

## 2019-08-31 DIAGNOSIS — N189 Chronic kidney disease, unspecified: Secondary | ICD-10-CM | POA: Diagnosis not present

## 2019-08-31 DIAGNOSIS — K56609 Unspecified intestinal obstruction, unspecified as to partial versus complete obstruction: Secondary | ICD-10-CM | POA: Diagnosis not present

## 2019-08-31 DIAGNOSIS — J479 Bronchiectasis, uncomplicated: Secondary | ICD-10-CM | POA: Diagnosis not present

## 2019-09-01 ENCOUNTER — Ambulatory Visit: Payer: Medicare Other | Admitting: Physician Assistant

## 2019-09-02 DIAGNOSIS — I4891 Unspecified atrial fibrillation: Secondary | ICD-10-CM | POA: Diagnosis not present

## 2019-09-02 DIAGNOSIS — I509 Heart failure, unspecified: Secondary | ICD-10-CM | POA: Diagnosis not present

## 2019-09-02 DIAGNOSIS — N189 Chronic kidney disease, unspecified: Secondary | ICD-10-CM | POA: Diagnosis not present

## 2019-09-02 DIAGNOSIS — J479 Bronchiectasis, uncomplicated: Secondary | ICD-10-CM | POA: Diagnosis not present

## 2019-09-02 DIAGNOSIS — N390 Urinary tract infection, site not specified: Secondary | ICD-10-CM | POA: Diagnosis not present

## 2019-09-02 DIAGNOSIS — K56609 Unspecified intestinal obstruction, unspecified as to partial versus complete obstruction: Secondary | ICD-10-CM | POA: Diagnosis not present

## 2019-09-06 DIAGNOSIS — I509 Heart failure, unspecified: Secondary | ICD-10-CM | POA: Diagnosis not present

## 2019-09-06 DIAGNOSIS — J479 Bronchiectasis, uncomplicated: Secondary | ICD-10-CM | POA: Diagnosis not present

## 2019-09-06 DIAGNOSIS — I4891 Unspecified atrial fibrillation: Secondary | ICD-10-CM | POA: Diagnosis not present

## 2019-09-06 DIAGNOSIS — N390 Urinary tract infection, site not specified: Secondary | ICD-10-CM | POA: Diagnosis not present

## 2019-09-06 DIAGNOSIS — K56609 Unspecified intestinal obstruction, unspecified as to partial versus complete obstruction: Secondary | ICD-10-CM | POA: Diagnosis not present

## 2019-09-06 DIAGNOSIS — N189 Chronic kidney disease, unspecified: Secondary | ICD-10-CM | POA: Diagnosis not present

## 2019-09-07 DIAGNOSIS — N390 Urinary tract infection, site not specified: Secondary | ICD-10-CM | POA: Diagnosis not present

## 2019-09-07 DIAGNOSIS — I509 Heart failure, unspecified: Secondary | ICD-10-CM | POA: Diagnosis not present

## 2019-09-07 DIAGNOSIS — I4891 Unspecified atrial fibrillation: Secondary | ICD-10-CM | POA: Diagnosis not present

## 2019-09-07 DIAGNOSIS — K56609 Unspecified intestinal obstruction, unspecified as to partial versus complete obstruction: Secondary | ICD-10-CM | POA: Diagnosis not present

## 2019-09-07 DIAGNOSIS — N189 Chronic kidney disease, unspecified: Secondary | ICD-10-CM | POA: Diagnosis not present

## 2019-09-07 DIAGNOSIS — J479 Bronchiectasis, uncomplicated: Secondary | ICD-10-CM | POA: Diagnosis not present

## 2019-09-09 DIAGNOSIS — N189 Chronic kidney disease, unspecified: Secondary | ICD-10-CM | POA: Diagnosis not present

## 2019-09-09 DIAGNOSIS — I4891 Unspecified atrial fibrillation: Secondary | ICD-10-CM | POA: Diagnosis not present

## 2019-09-09 DIAGNOSIS — N390 Urinary tract infection, site not specified: Secondary | ICD-10-CM | POA: Diagnosis not present

## 2019-09-09 DIAGNOSIS — J479 Bronchiectasis, uncomplicated: Secondary | ICD-10-CM | POA: Diagnosis not present

## 2019-09-09 DIAGNOSIS — I509 Heart failure, unspecified: Secondary | ICD-10-CM | POA: Diagnosis not present

## 2019-09-09 DIAGNOSIS — K56609 Unspecified intestinal obstruction, unspecified as to partial versus complete obstruction: Secondary | ICD-10-CM | POA: Diagnosis not present

## 2019-09-10 DIAGNOSIS — K56609 Unspecified intestinal obstruction, unspecified as to partial versus complete obstruction: Secondary | ICD-10-CM | POA: Diagnosis not present

## 2019-09-10 DIAGNOSIS — I509 Heart failure, unspecified: Secondary | ICD-10-CM | POA: Diagnosis not present

## 2019-09-10 DIAGNOSIS — N390 Urinary tract infection, site not specified: Secondary | ICD-10-CM | POA: Diagnosis not present

## 2019-09-10 DIAGNOSIS — I4891 Unspecified atrial fibrillation: Secondary | ICD-10-CM | POA: Diagnosis not present

## 2019-09-10 DIAGNOSIS — J479 Bronchiectasis, uncomplicated: Secondary | ICD-10-CM | POA: Diagnosis not present

## 2019-09-10 DIAGNOSIS — N189 Chronic kidney disease, unspecified: Secondary | ICD-10-CM | POA: Diagnosis not present

## 2019-09-14 DIAGNOSIS — I509 Heart failure, unspecified: Secondary | ICD-10-CM | POA: Diagnosis not present

## 2019-09-14 DIAGNOSIS — N390 Urinary tract infection, site not specified: Secondary | ICD-10-CM | POA: Diagnosis not present

## 2019-09-14 DIAGNOSIS — J479 Bronchiectasis, uncomplicated: Secondary | ICD-10-CM | POA: Diagnosis not present

## 2019-09-14 DIAGNOSIS — N189 Chronic kidney disease, unspecified: Secondary | ICD-10-CM | POA: Diagnosis not present

## 2019-09-14 DIAGNOSIS — K56609 Unspecified intestinal obstruction, unspecified as to partial versus complete obstruction: Secondary | ICD-10-CM | POA: Diagnosis not present

## 2019-09-14 DIAGNOSIS — I4891 Unspecified atrial fibrillation: Secondary | ICD-10-CM | POA: Diagnosis not present

## 2019-09-15 DIAGNOSIS — N390 Urinary tract infection, site not specified: Secondary | ICD-10-CM | POA: Diagnosis not present

## 2019-09-15 DIAGNOSIS — N189 Chronic kidney disease, unspecified: Secondary | ICD-10-CM | POA: Diagnosis not present

## 2019-09-15 DIAGNOSIS — H3530 Unspecified macular degeneration: Secondary | ICD-10-CM | POA: Diagnosis not present

## 2019-09-15 DIAGNOSIS — K56609 Unspecified intestinal obstruction, unspecified as to partial versus complete obstruction: Secondary | ICD-10-CM | POA: Diagnosis not present

## 2019-09-15 DIAGNOSIS — Z431 Encounter for attention to gastrostomy: Secondary | ICD-10-CM | POA: Diagnosis not present

## 2019-09-15 DIAGNOSIS — J479 Bronchiectasis, uncomplicated: Secondary | ICD-10-CM | POA: Diagnosis not present

## 2019-09-15 DIAGNOSIS — I4891 Unspecified atrial fibrillation: Secondary | ICD-10-CM | POA: Diagnosis not present

## 2019-09-15 DIAGNOSIS — K219 Gastro-esophageal reflux disease without esophagitis: Secondary | ICD-10-CM | POA: Diagnosis not present

## 2019-09-15 DIAGNOSIS — Z681 Body mass index (BMI) 19 or less, adult: Secondary | ICD-10-CM | POA: Diagnosis not present

## 2019-09-15 DIAGNOSIS — E039 Hypothyroidism, unspecified: Secondary | ICD-10-CM | POA: Diagnosis not present

## 2019-09-15 DIAGNOSIS — M3500 Sicca syndrome, unspecified: Secondary | ICD-10-CM | POA: Diagnosis not present

## 2019-09-15 DIAGNOSIS — I509 Heart failure, unspecified: Secondary | ICD-10-CM | POA: Diagnosis not present

## 2019-09-15 DIAGNOSIS — Z86718 Personal history of other venous thrombosis and embolism: Secondary | ICD-10-CM | POA: Diagnosis not present

## 2019-09-16 DIAGNOSIS — N189 Chronic kidney disease, unspecified: Secondary | ICD-10-CM | POA: Diagnosis not present

## 2019-09-16 DIAGNOSIS — I509 Heart failure, unspecified: Secondary | ICD-10-CM | POA: Diagnosis not present

## 2019-09-16 DIAGNOSIS — K56609 Unspecified intestinal obstruction, unspecified as to partial versus complete obstruction: Secondary | ICD-10-CM | POA: Diagnosis not present

## 2019-09-16 DIAGNOSIS — J479 Bronchiectasis, uncomplicated: Secondary | ICD-10-CM | POA: Diagnosis not present

## 2019-09-16 DIAGNOSIS — N390 Urinary tract infection, site not specified: Secondary | ICD-10-CM | POA: Diagnosis not present

## 2019-09-16 DIAGNOSIS — I4891 Unspecified atrial fibrillation: Secondary | ICD-10-CM | POA: Diagnosis not present

## 2019-09-17 DIAGNOSIS — K56609 Unspecified intestinal obstruction, unspecified as to partial versus complete obstruction: Secondary | ICD-10-CM | POA: Diagnosis not present

## 2019-09-17 DIAGNOSIS — J479 Bronchiectasis, uncomplicated: Secondary | ICD-10-CM | POA: Diagnosis not present

## 2019-09-17 DIAGNOSIS — I4891 Unspecified atrial fibrillation: Secondary | ICD-10-CM | POA: Diagnosis not present

## 2019-09-17 DIAGNOSIS — I509 Heart failure, unspecified: Secondary | ICD-10-CM | POA: Diagnosis not present

## 2019-09-17 DIAGNOSIS — N189 Chronic kidney disease, unspecified: Secondary | ICD-10-CM | POA: Diagnosis not present

## 2019-09-17 DIAGNOSIS — N390 Urinary tract infection, site not specified: Secondary | ICD-10-CM | POA: Diagnosis not present

## 2019-09-19 DIAGNOSIS — J479 Bronchiectasis, uncomplicated: Secondary | ICD-10-CM | POA: Diagnosis not present

## 2019-09-19 DIAGNOSIS — K56609 Unspecified intestinal obstruction, unspecified as to partial versus complete obstruction: Secondary | ICD-10-CM | POA: Diagnosis not present

## 2019-09-19 DIAGNOSIS — I4891 Unspecified atrial fibrillation: Secondary | ICD-10-CM | POA: Diagnosis not present

## 2019-09-19 DIAGNOSIS — I509 Heart failure, unspecified: Secondary | ICD-10-CM | POA: Diagnosis not present

## 2019-09-19 DIAGNOSIS — N189 Chronic kidney disease, unspecified: Secondary | ICD-10-CM | POA: Diagnosis not present

## 2019-09-19 DIAGNOSIS — N390 Urinary tract infection, site not specified: Secondary | ICD-10-CM | POA: Diagnosis not present

## 2019-09-21 DIAGNOSIS — K56609 Unspecified intestinal obstruction, unspecified as to partial versus complete obstruction: Secondary | ICD-10-CM | POA: Diagnosis not present

## 2019-09-21 DIAGNOSIS — I4891 Unspecified atrial fibrillation: Secondary | ICD-10-CM | POA: Diagnosis not present

## 2019-09-21 DIAGNOSIS — I509 Heart failure, unspecified: Secondary | ICD-10-CM | POA: Diagnosis not present

## 2019-09-21 DIAGNOSIS — N189 Chronic kidney disease, unspecified: Secondary | ICD-10-CM | POA: Diagnosis not present

## 2019-09-21 DIAGNOSIS — N390 Urinary tract infection, site not specified: Secondary | ICD-10-CM | POA: Diagnosis not present

## 2019-09-21 DIAGNOSIS — J479 Bronchiectasis, uncomplicated: Secondary | ICD-10-CM | POA: Diagnosis not present

## 2019-09-30 ENCOUNTER — Telehealth: Payer: Self-pay | Admitting: Interventional Cardiology

## 2019-09-30 NOTE — Telephone Encounter (Signed)
New Message  Pts daughter is calling to let Dr [name] know that the patient had passed away on 24-Sep-2019.   Pts daughter wants to thank Anderson Malta and Dr [name] for their loving care, she says Dr [name] was her mothers favorite Dr.   She thanks you both very much for everything!

## 2019-10-08 ENCOUNTER — Ambulatory Visit: Payer: Medicare Other | Admitting: Podiatry

## 2019-10-15 DEATH — deceased

## 2019-11-15 DEATH — deceased

## 2020-08-31 IMAGING — CT CT HIP*L* W/O CM
1 series · 16 of 32 positions shown, 20 images · non-contrast
Comparison: None.

CLINICAL DATA: Left hip pain.

EXAM:
CT OF THE LEFT HIP WITHOUT CONTRAST
TECHNIQUE: Multidetector CT imaging of the left hip was performed according to
the standard protocol. Multiplanar CT image reconstructions were
also generated.

[Series 3: soft tissue pelvis/hip · axial · 0.72mm/px · z∈[+627,+882]mm · 16 of 96 slices shown, 20 images]
[im 7/96  soft-tissue]
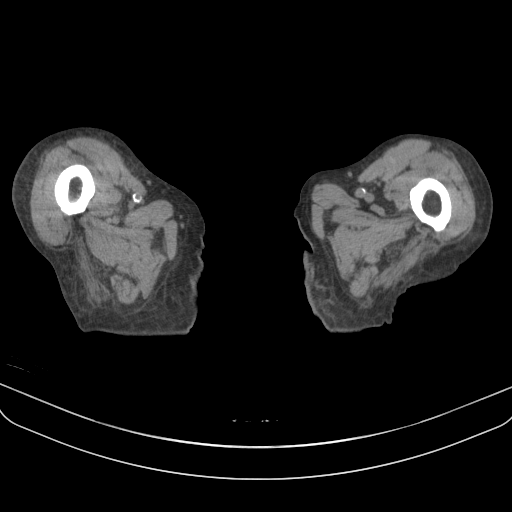
[im 7/96  bone]
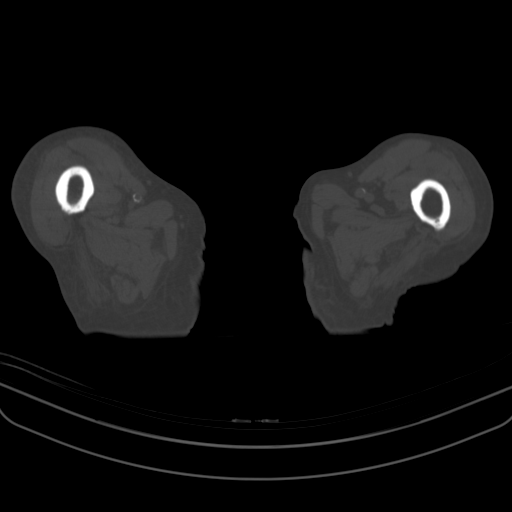
[im 13/96  soft-tissue]
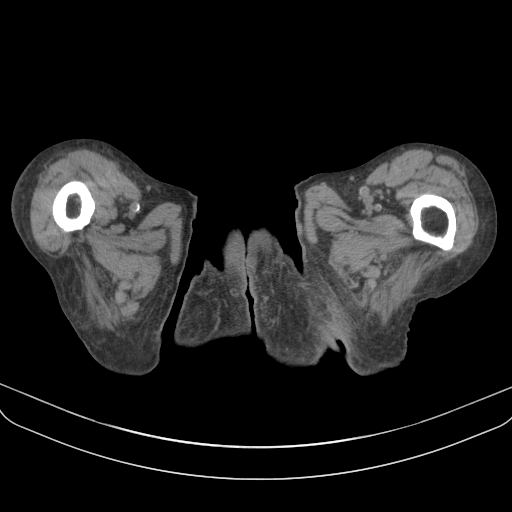
[im 19/96  soft-tissue]
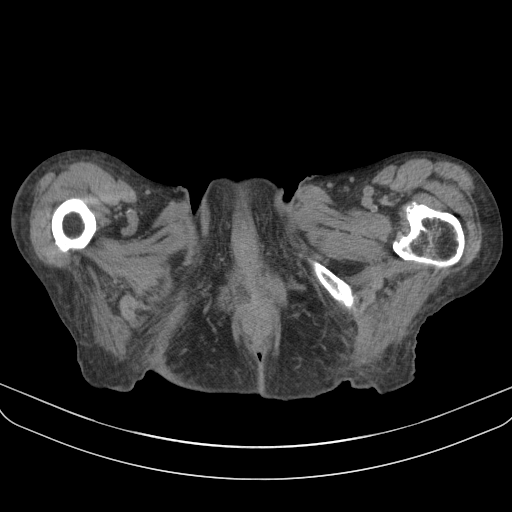
[im 25/96  soft-tissue]
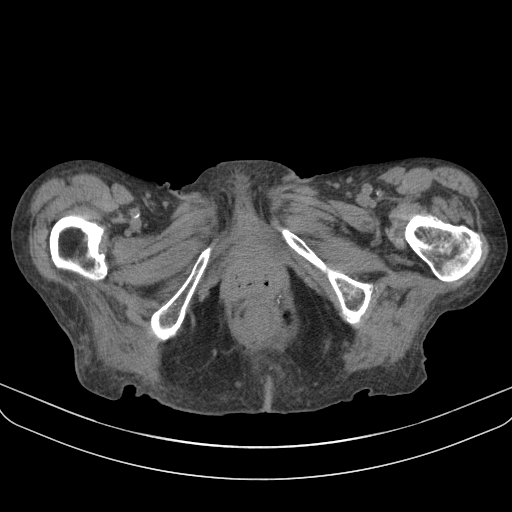
[im 31/96  soft-tissue]
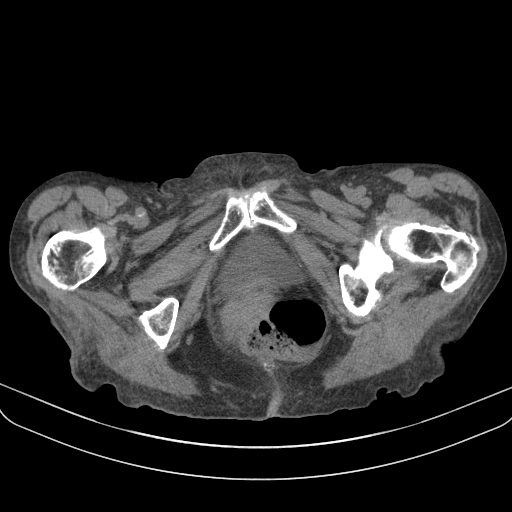
[im 37/96  soft-tissue]
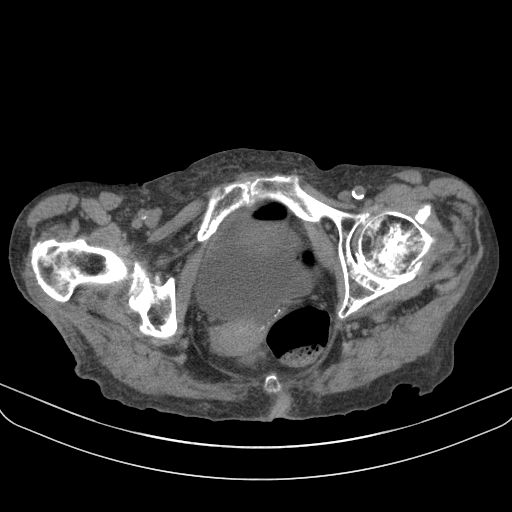
[im 43/96  soft-tissue]
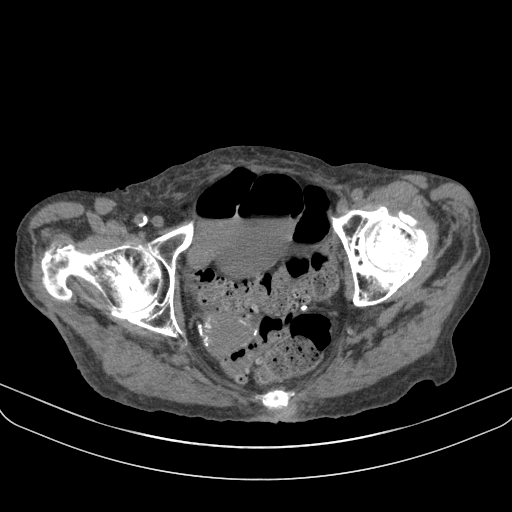
[im 53/96  soft-tissue]
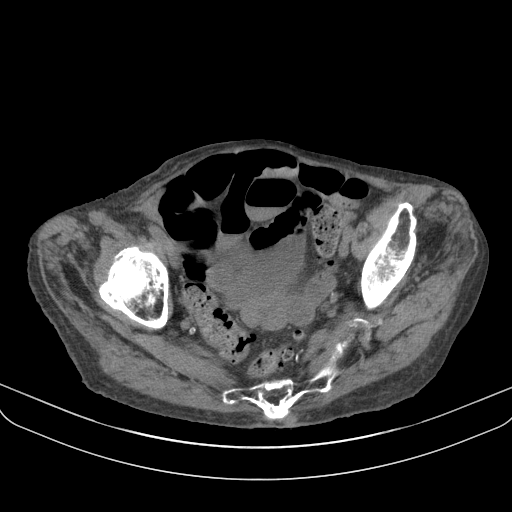
[im 59/96  soft-tissue]
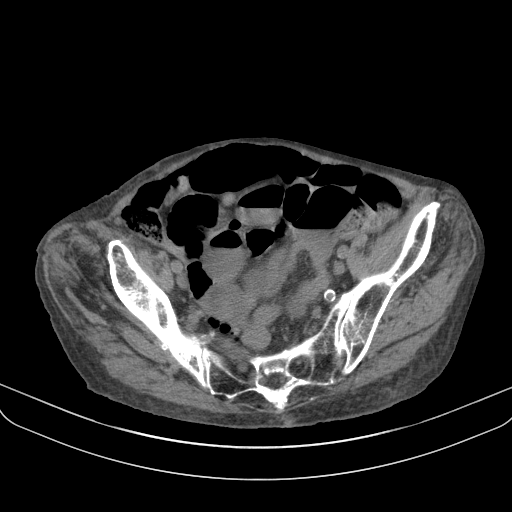
[im 59/96  bone]
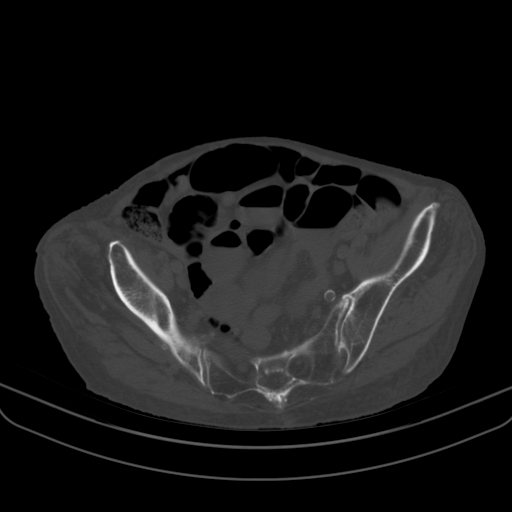
[im 65/96  soft-tissue]
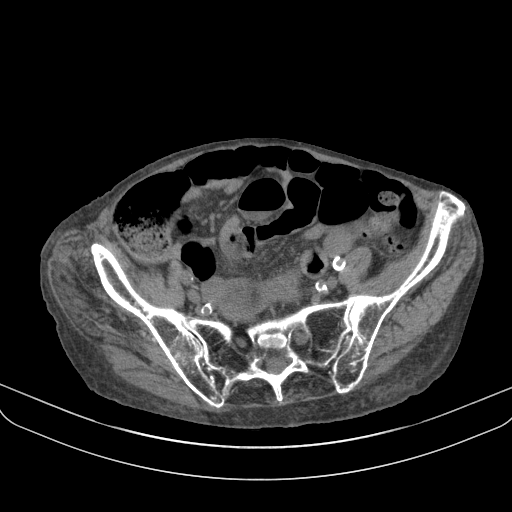
[im 71/96  soft-tissue]
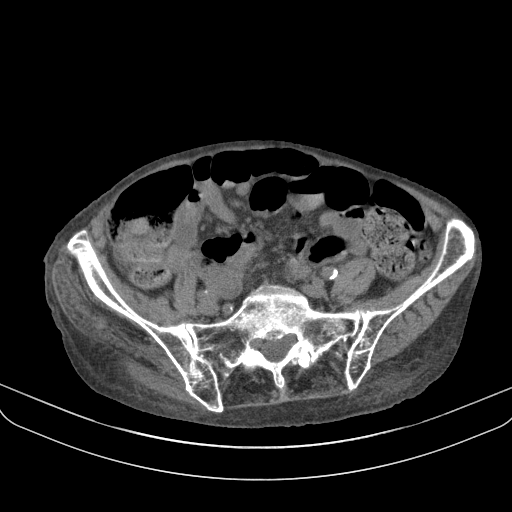
[im 77/96  soft-tissue]
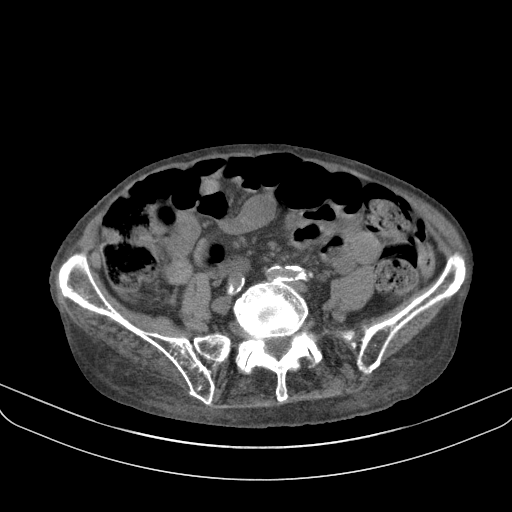
[im 83/96  soft-tissue]
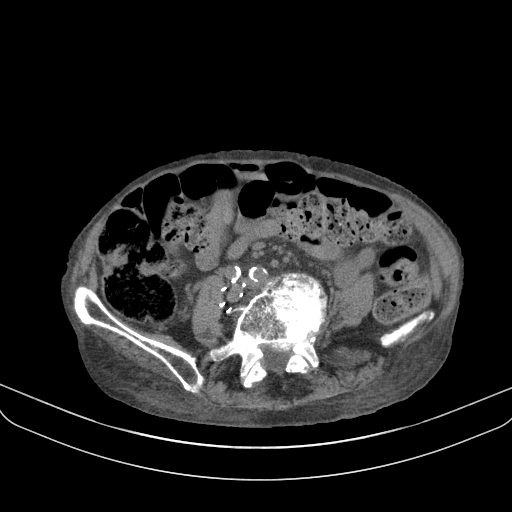
[im 83/96  lung]
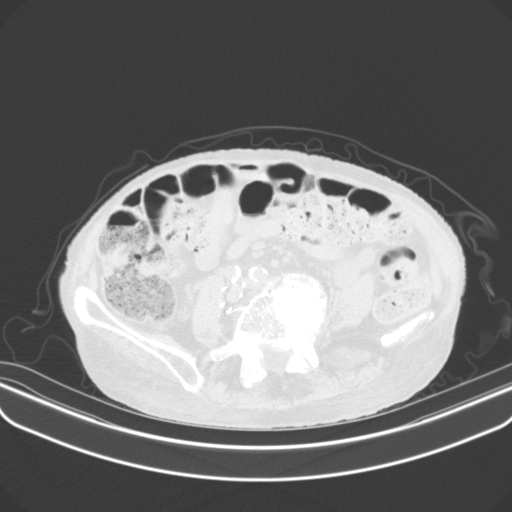
[im 86/96  lung]
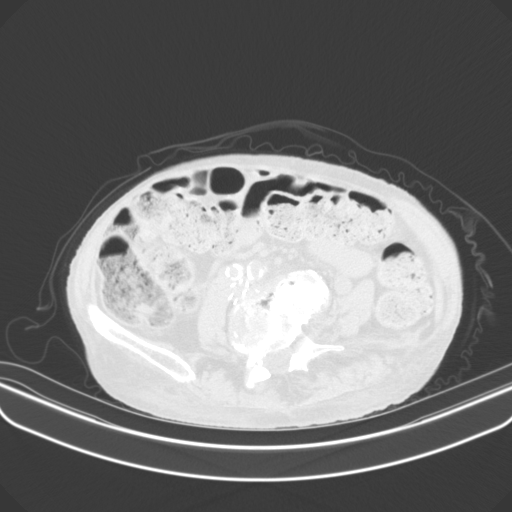
[im 89/96  soft-tissue]
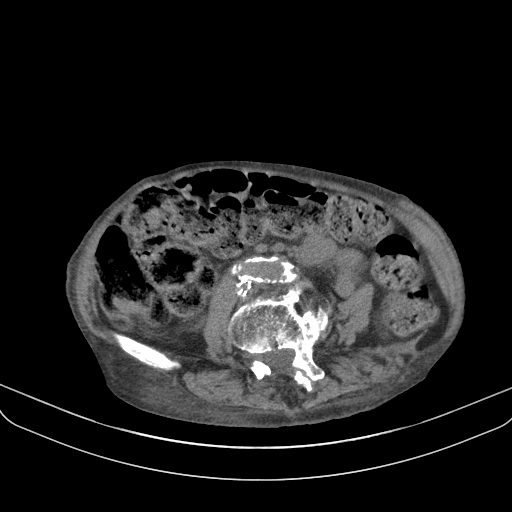
[im 89/96  lung]
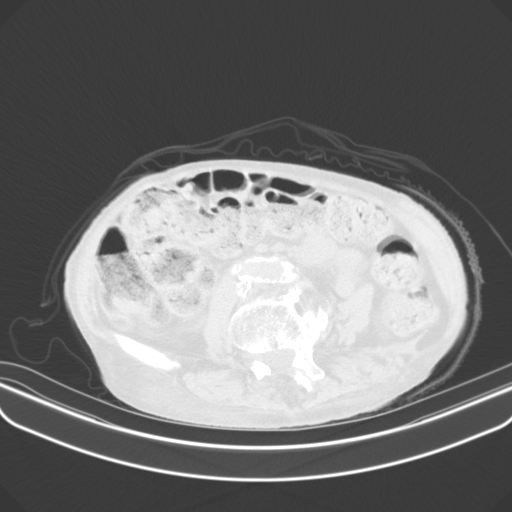
[im 92/96  lung]
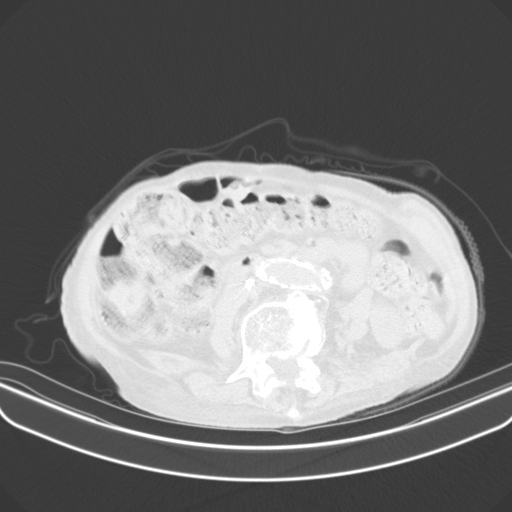

[16 of 32 positions shown; findings below may reference images not displayed]

FINDINGS: No fracture or dislocation is noted. Moderate degenerative changes
noted involving the left hip joint. Severe degenerative changes are
seen involving the visualized lower lumbar spine. Atherosclerosis of
abdominal aorta is noted. There is no evidence of bowel obstruction
or inflammation. Status post hysterectomy. No adnexal abnormality is
noted. No abnormal fluid collection is noted.
IMPRESSION: Moderate degenerative changes are noted involving the left hip
joint. Severe degenerative changes are seen involving the visualized
lower lumbar spine. No acute abnormality seen in the left hip.

Aortic Atherosclerosis ([id number]).
